# Patient Record
Sex: Female | Born: 1995
Health system: Southern US, Community
[De-identification: ages and names within clinical notes are randomized; demographics above are authoritative.]

## PROBLEM LIST (undated history)

## (undated) ENCOUNTER — Inpatient Hospital Stay (HOSPITAL_COMMUNITY): Payer: Self-pay

## (undated) ENCOUNTER — Emergency Department (HOSPITAL_COMMUNITY)

## (undated) DIAGNOSIS — O1490 Unspecified pre-eclampsia, unspecified trimester: Secondary | ICD-10-CM

## (undated) DIAGNOSIS — F32A Depression, unspecified: Secondary | ICD-10-CM

## (undated) DIAGNOSIS — R748 Abnormal levels of other serum enzymes: Secondary | ICD-10-CM

## (undated) DIAGNOSIS — F329 Major depressive disorder, single episode, unspecified: Secondary | ICD-10-CM

## (undated) DIAGNOSIS — E282 Polycystic ovarian syndrome: Secondary | ICD-10-CM

## (undated) DIAGNOSIS — O24419 Gestational diabetes mellitus in pregnancy, unspecified control: Secondary | ICD-10-CM

## (undated) DIAGNOSIS — T7840XA Allergy, unspecified, initial encounter: Secondary | ICD-10-CM

## (undated) DIAGNOSIS — R51 Headache: Secondary | ICD-10-CM

## (undated) DIAGNOSIS — Z8759 Personal history of other complications of pregnancy, childbirth and the puerperium: Secondary | ICD-10-CM

## (undated) DIAGNOSIS — R519 Headache, unspecified: Secondary | ICD-10-CM

## (undated) DIAGNOSIS — F419 Anxiety disorder, unspecified: Secondary | ICD-10-CM

## (undated) DIAGNOSIS — D649 Anemia, unspecified: Secondary | ICD-10-CM

## (undated) DIAGNOSIS — B999 Unspecified infectious disease: Secondary | ICD-10-CM

## (undated) DIAGNOSIS — D271 Benign neoplasm of left ovary: Secondary | ICD-10-CM

## (undated) DIAGNOSIS — I10 Essential (primary) hypertension: Secondary | ICD-10-CM

## (undated) HISTORY — DX: Allergy, unspecified, initial encounter: T78.40XA

## (undated) HISTORY — DX: Abnormal levels of other serum enzymes: R74.8

## (undated) HISTORY — DX: Headache: R51

## (undated) HISTORY — PX: TOOTH EXTRACTION: SUR596

## (undated) HISTORY — DX: Anxiety disorder, unspecified: F41.9

## (undated) HISTORY — DX: Depression, unspecified: F32.A

## (undated) HISTORY — DX: Headache, unspecified: R51.9

## (undated) HISTORY — PX: NO PAST SURGERIES: SHX2092

## (undated) HISTORY — DX: Unspecified pre-eclampsia, unspecified trimester: O14.90

## (undated) HISTORY — DX: Major depressive disorder, single episode, unspecified: F32.9

---
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
## 2015-09-24 LAB — HM PAP SMEAR: HM PAP: NORMAL

## 2016-09-13 ENCOUNTER — Ambulatory Visit: Payer: Self-pay | Admitting: Internal Medicine

## 2016-09-18 ENCOUNTER — Encounter: Payer: Self-pay | Admitting: Internal Medicine

## 2016-09-18 ENCOUNTER — Ambulatory Visit (INDEPENDENT_AMBULATORY_CARE_PROVIDER_SITE_OTHER): Payer: BLUE CROSS/BLUE SHIELD | Admitting: Internal Medicine

## 2016-09-18 VITALS — BP 122/84 | HR 84 | Temp 97.9°F | Ht 67.5 in | Wt 314.8 lb

## 2016-09-18 DIAGNOSIS — D72829 Elevated white blood cell count, unspecified: Secondary | ICD-10-CM

## 2016-09-18 DIAGNOSIS — R51 Headache: Secondary | ICD-10-CM | POA: Diagnosis not present

## 2016-09-18 DIAGNOSIS — J302 Other seasonal allergic rhinitis: Secondary | ICD-10-CM | POA: Insufficient documentation

## 2016-09-18 DIAGNOSIS — R519 Headache, unspecified: Secondary | ICD-10-CM | POA: Insufficient documentation

## 2016-09-18 DIAGNOSIS — J301 Allergic rhinitis due to pollen: Secondary | ICD-10-CM

## 2016-09-18 DIAGNOSIS — F32A Depression, unspecified: Secondary | ICD-10-CM | POA: Insufficient documentation

## 2016-09-18 DIAGNOSIS — F329 Major depressive disorder, single episode, unspecified: Secondary | ICD-10-CM | POA: Insufficient documentation

## 2016-09-18 DIAGNOSIS — F419 Anxiety disorder, unspecified: Secondary | ICD-10-CM

## 2016-09-18 DIAGNOSIS — F418 Other specified anxiety disorders: Secondary | ICD-10-CM

## 2016-09-18 HISTORY — DX: Anxiety disorder, unspecified: F41.9

## 2016-09-18 HISTORY — DX: Depression, unspecified: F32.A

## 2016-09-18 LAB — CBC WITH DIFFERENTIAL/PLATELET
Basophils Absolute: 0 10*3/uL (ref 0.0–0.1)
Basophils Relative: 0.5 % (ref 0.0–3.0)
Eosinophils Absolute: 0.2 10*3/uL (ref 0.0–0.7)
Eosinophils Relative: 1.8 % (ref 0.0–5.0)
HEMATOCRIT: 38.7 % (ref 36.0–46.0)
Hemoglobin: 12.5 g/dL (ref 12.0–15.0)
LYMPHS PCT: 31.1 % (ref 12.0–46.0)
Lymphs Abs: 2.8 10*3/uL (ref 0.7–4.0)
MCHC: 32.3 g/dL (ref 30.0–36.0)
MCV: 80.4 fl (ref 78.0–100.0)
MONO ABS: 0.5 10*3/uL (ref 0.1–1.0)
Monocytes Relative: 5.6 % (ref 3.0–12.0)
Neutro Abs: 5.5 10*3/uL (ref 1.4–7.7)
Neutrophils Relative %: 61 % (ref 43.0–77.0)
Platelets: 252 10*3/uL (ref 150.0–400.0)
RBC: 4.82 Mil/uL (ref 3.87–5.11)
RDW: 15.1 % (ref 11.5–15.5)
WBC: 9.1 10*3/uL (ref 4.0–10.5)

## 2016-09-18 MED ORDER — ESCITALOPRAM OXALATE 20 MG PO TABS: 20.0000 mg | ORAL_TABLET | Freq: Every day | ORAL | 11 refills | Status: DC

## 2016-09-18 NOTE — Assessment & Plan Note (Signed)
Advised her to try Zyrtec OTC if symptoms are really bad

## 2016-09-18 NOTE — Assessment & Plan Note (Signed)
Chronic but stable on Lexapro Refilled today

## 2016-09-18 NOTE — Progress Notes (Signed)
HPI  Pt presents to the clinic today to establish care and for management of the conditions listed below. She is transferring care from [facility].  Seasonal Allergies: Worse in the spring and summer. She does not take anything OTC for this.  Anxiety and Depression: She is more anxious than depressed. She is not sure what triggers this. She is taking Lexapro with good relief. She is requesting a refill of Lexapro today.  Frequent Headaches: She feels like this is related to the Lexapro. She gets them 2 x week. She takes Tylenol with good relief.  She does c/o a tick bite. This occurred 3 weeks ago. She was seen at [facility] and was tested for Lyme Disease and RMSF. Her WBC was 17.4%. She was treated with 7 days of Doxycycline.   Flu: never Tetanus: unsure Pap Smear: 09/2015, [facility] Dentist: annually  Past Medical History:  Diagnosis Date  . Allergy   . Depression   . Frequent headaches     Current Outpatient Prescriptions  Medication Sig Dispense Refill  . escitalopram (LEXAPRO) 20 MG tablet Take by mouth.     No current facility-administered medications for this visit.     Allergies  Allergen Reactions  . Etodolac Nausea Only  . Meloxicam Other (See Comments)    Abdominal pain    Family History  Problem Relation Age of Onset  . Alcohol abuse Mother   . Drug abuse Mother   . Depression Mother   . Anxiety disorder Mother   . Alcohol abuse Father   . Bipolar disorder Father   . Depression Father   . COPD Maternal Grandmother   . COPD Maternal Grandfather   . Arthritis Paternal Grandmother   . Bipolar disorder Paternal Grandfather     Social History   Social History  . Marital status: Married    Spouse name: N/A  . Number of children: N/A  . Years of education: N/A   Occupational History  . Not on file.   Social History Main Topics  . Smoking status: Never Smoker  . Smokeless tobacco: Never Used  . Alcohol use Yes     Comment:  occasional  . Drug use: No  . Sexual activity: Not on file   Other Topics Concern  . Not on file   Social History Narrative  . No narrative on file    ROS:  Constitutional: Denies fever, malaise, fatigue, headache or abrupt weight changes.  HEENT: Denies eye pain, eye redness, ear pain, ringing in the ears, wax buildup, runny nose, nasal congestion, bloody nose, or sore throat. Respiratory: Denies difficulty breathing, shortness of breath, cough or sputum production.   Cardiovascular: Denies chest pain, chest tightness, palpitations or swelling in the hands or feet.  Gastrointestinal: Denies abdominal pain, bloating, constipation, diarrhea or blood in the stool.  GU: Denies frequency, urgency, pain with urination, blood in urine, odor or discharge. Musculoskeletal: Denies decrease in range of motion, difficulty with gait, muscle pain or joint pain and swelling.  Skin: Denies redness, rashes, lesions or ulcercations.  Neurological: Denies dizziness, difficulty with memory, difficulty with speech or problems with balance and coordination.  Psych: Denies anxiety, depression, SI/HI.  No other specific complaints in a complete review of systems (except as listed in HPI above).  PE:  BP 122/84   Pulse 84   Temp 97.9 F (36.6 C) (Oral)   Ht 5' 7.5" (1.715 m)   Wt (!) 314 lb 12 oz (142.8 kg)   LMP  09/01/2016   SpO2 98%   BMI 48.57 kg/m  Wt Readings from Last 3 Encounters:  09/18/16 (!) 314 lb 12 oz (142.8 kg)    General: Appears her stated age, obese in NAD. HEENT: Head: normal shape and size; Eyes: sclera white, no icterus, conjunctiva pink, PERRLA and EOMs intact; Ears: Tm's gray and intact, normal light reflex;Throat/Mouth: Teeth present, mucosa pink and moist, no lesions or ulcerations noted.  Cardiovascular: Normal rate and rhythm. S1,S2 noted.  No murmur, rubs or gallops noted.  Pulmonary/Chest: Normal effort and positive vesicular breath sounds. No respiratory distress. No  wheezes, rales or ronchi noted.  Neurological: Alert and oriented.  Psychiatric: Mood and affect normal. Behavior is normal. Judgment and thought content normal.    Assessment and Plan:  Elevated WBC:  CBC repeated today  RTC in 1 year for your annual exam Webb Silversmith, NP

## 2016-09-18 NOTE — Patient Instructions (Signed)
Leukocytosis Leukocytosis means that a person has more white blood cells than normal. White blood cells are made in the bone marrow. Bone marrow is the spongy tissue inside of bones. The main job of white blood cells is to fight infection. Having too many white blood cells is a common condition. It can develop as a result of many types of medical problems. What are the causes? This condition may be caused by various problems. In some cases, the bone marrow is normal but it is still making too many white blood cells. This could be the result of:  Infection.  Injury.  Physical stress.  Emotional stress.  Surgery.  Allergic reactions.  Tumors that do not start in the blood or bone marrow.  An inherited disease.  Certain medicines.  Pregnancy and labor. In other cases, a person may have a bone marrow disorder that is causing the body to make too many white blood cells. Bone marrow disorders include:  Leukemia. This is a type of blood cancer.  Myeloproliferative disorders. These disorders cause blood cells to grow abnormally. What are the signs or symptoms? Often, this condition causes no symptoms. Some people may have symptoms due to the medical problem that is causing their leukocytosis. These symptoms may include:  Bleeding.  Bruising.  Fever.  Night sweats.  Repeated infections.  Weakness.  Weight loss. How is this diagnosed? This condition is diagnosed with blood tests. It is often found when blood is tested as part of a routine physical exam. You may have other tests to help determine why you have too many white blood cells. These tests may include:  A complete blood count (CBC). This test measures all the types of blood cells in your body.  Chest X-rays, urine tests, or other tests to look for signs of infection.  Bone marrow aspiration. For this test, a needle is put into your bone. Cells from the bone marrow are removed through the needle, then they are  examined under a microscope.  Other tests on the blood or bone marrow sample. How is this treated? Usually, treatment is not needed for leukocytosis. However, if a disorder is causing your leukocytosis, it will need to be treated. Treatment may include:  Antibiotic medicine if you have a bacterial infection.  Bone marrow transplant. This treatment replaces your diseased bone marrow with healthy cells that will grow new bone marrow.  Chemotherapy or biological therapies such as the use of antibodies. These treatments may be used to kill cancer cells or to decrease the number of white blood cells. Follow these instructions at home: Medicines   Take over-the-counter and prescription medicines only as told by your health care provider.  If you were prescribed an antibiotic medicine, take it as told by your health care provider. Do not stop taking the antibiotic even if you start to feel better. Eating and drinking   Eat foods that are low in saturated fats and high in fiber. Eat plenty of fruits and vegetables.  Drink enough fluid to keep your urine clear or pale yellow.  Limit your intake of caffeine and alcohol. General instructions   Maintain a healthy weight. Ask your health care provider what weight is best for you.  Do 30 minutes of exercise at least 5 times each week. Check with your health care provider before you start a new exercise routine.  Do not use tobacco products, including cigarettes, chewing tobacco, or e-cigarettes. If you need help quitting, ask your health care provider.  Keep all follow-up visits as told by your health care provider. This is important. Contact a health care provider if:  You feel weak or more tired than usual.  You develop chills, a cough, or nasal congestion.  You have a fever.  You lose weight without trying.  You have night sweats.  You bruise easily. Get help right away if:  You bleed more than normal.  You have chest  pain.  You have trouble breathing.  You have uncontrolled nausea or vomiting.  You feel dizzy or light-headed. This information is not intended to replace advice given to you by your health care provider. Make sure you discuss any questions you have with your health care provider. Document Released: 05/31/2011 Document Revised: 11/17/2015 Document Reviewed: 12/13/2014 Elsevier Interactive Patient Education  2017 [employer].

## 2016-09-18 NOTE — Assessment & Plan Note (Signed)
She feels like this is medication related but the benefits of the Lexapro outweigh the side effects Continue Tylenol prn

## 2016-10-30 ENCOUNTER — Ambulatory Visit (INDEPENDENT_AMBULATORY_CARE_PROVIDER_SITE_OTHER): Payer: BLUE CROSS/BLUE SHIELD | Admitting: Internal Medicine

## 2016-10-30 ENCOUNTER — Encounter: Payer: Self-pay | Admitting: Internal Medicine

## 2016-10-30 DIAGNOSIS — F329 Major depressive disorder, single episode, unspecified: Secondary | ICD-10-CM

## 2016-10-30 DIAGNOSIS — F419 Anxiety disorder, unspecified: Secondary | ICD-10-CM | POA: Diagnosis not present

## 2016-10-30 DIAGNOSIS — F32A Depression, unspecified: Secondary | ICD-10-CM

## 2016-10-30 MED ORDER — FLUOXETINE HCL 20 MG PO TABS: 20.0000 mg | ORAL_TABLET | Freq: Every day | ORAL | 2 refills | Status: DC

## 2016-10-30 MED ORDER — HYDROXYZINE HCL 10 MG PO TABS: 10.0000 mg | ORAL_TABLET | Freq: Every day | ORAL | 0 refills | Status: DC | PRN

## 2016-10-30 NOTE — Patient Instructions (Signed)
Generalized Anxiety Disorder, Adult Generalized anxiety disorder (GAD) is a mental health disorder. People with this condition constantly worry about everyday events. Unlike normal anxiety, worry related to GAD is not triggered by a specific event. These worries also do not fade or get better with time. GAD interferes with life functions, including relationships, work, and school. GAD can vary from mild to severe. People with severe GAD can have intense waves of anxiety with physical symptoms (panic attacks). What are the causes? The exact cause of GAD is not known. What increases the risk? This condition is more likely to develop in:  Women.  People who have a family history of anxiety disorders.  People who are very shy.  People who experience very stressful life events, such as the death of a loved one.  People who have a very stressful family environment. What are the signs or symptoms? People with GAD often worry excessively about many things in their lives, such as their health and family. They may also be overly concerned about:  Doing well at work.  Being on time.  Natural disasters.  Friendships. Physical symptoms of GAD include:  Fatigue.  Muscle tension or having muscle twitches.  Trembling or feeling shaky.  Being easily startled.  Feeling like your heart is pounding or racing.  Feeling out of breath or like you cannot take a deep breath.  Having trouble falling asleep or staying asleep.  Sweating.  Nausea, diarrhea, or irritable bowel syndrome (IBS).  Headaches.  Trouble concentrating or remembering facts.  Restlessness.  Irritability. How is this diagnosed? Your health care provider can diagnose GAD based on your symptoms and medical history. You will also have a physical exam. The health care provider will ask specific questions about your symptoms, including how severe they are, when they started, and if they come and go. Your health care  provider may ask you about your use of alcohol or drugs, including prescription medicines. Your health care provider may refer you to a mental health specialist for further evaluation. Your health care provider will do a thorough examination and may perform additional tests to rule out other possible causes of your symptoms. To be diagnosed with GAD, a person must have anxiety that:  Is out of his or her control.  Affects several different aspects of his or her life, such as work and relationships.  Causes distress that makes him or her unable to take part in normal activities.  Includes at least three physical symptoms of GAD, such as restlessness, fatigue, trouble concentrating, irritability, muscle tension, or sleep problems. Before your health care provider can confirm a diagnosis of GAD, these symptoms must be present more days than they are not, and they must last for six months or longer. How is this treated? The following therapies are usually used to treat GAD:  Medicine. Antidepressant medicine is usually prescribed for long-term daily control. Antianxiety medicines may be added in severe cases, especially when panic attacks occur.  Talk therapy (psychotherapy). Certain types of talk therapy can be helpful in treating GAD by providing support, education, and guidance. Options include:  Cognitive behavioral therapy (CBT). People learn coping skills and techniques to ease their anxiety. They learn to identify unrealistic or negative thoughts and behaviors and to replace them with positive ones.  Acceptance and commitment therapy (ACT). This treatment teaches people how to be mindful as a way to cope with unwanted thoughts and feelings.  Biofeedback. This process trains you to manage your body's response (  physiological response) through breathing techniques and relaxation methods. You will work with a therapist while machines are used to monitor your physical symptoms.  Stress  management techniques. These include yoga, meditation, and exercise. A mental health specialist can help determine which treatment is best for you. Some people see improvement with one type of therapy. However, other people require a combination of therapies. Follow these instructions at home:  Take over-the-counter and prescription medicines only as told by your health care provider.  Try to maintain a normal routine.  Try to anticipate stressful situations and allow extra time to manage them.  Practice any stress management or self-calming techniques as taught by your health care provider.  Do not punish yourself for setbacks or for not making progress.  Try to recognize your accomplishments, even if they are small.  Keep all follow-up visits as told by your health care provider. This is important. Contact a health care provider if:  Your symptoms do not get better.  Your symptoms get worse.  You have signs of depression, such as:  A persistently sad, cranky, or irritable mood.  Loss of enjoyment in activities that used to bring you joy.  Change in weight or eating.  Changes in sleeping habits.  Avoiding friends or family members.  Loss of energy for normal tasks.  Feelings of guilt or worthlessness. Get help right away if:  You have serious thoughts about hurting yourself or others. If you ever feel like you may hurt yourself or others, or have thoughts about taking your own life, get help right away. You can go to your nearest emergency department or call:  Your local emergency services (911 in the [place]  A suicide crisis helpline, such as the National Suicide Prevention Lifeline at 1-800-273-8255. This is open 24 hours a day. Summary  Generalized anxiety disorder (GAD) is a mental health disorder that involves worry that is not triggered by a specific event.  People with GAD often worry excessively about many things in their lives, such as their health and  family.  GAD may cause physical symptoms such as restlessness, trouble concentrating, sleep problems, frequent sweating, nausea, diarrhea, headaches, and trembling or muscle twitching.  A mental health specialist can help determine which treatment is best for you. Some people see improvement with one type of therapy. However, other people require a combination of therapies. This information is not intended to replace advice given to you by your health care provider. Make sure you discuss any questions you have with your health care provider. Document Released: 10/06/2012 Document Revised: 05/01/2016 Document Reviewed: 05/01/2016 Elsevier Interactive Patient Education  2017 [employer] Inc.  

## 2016-10-30 NOTE — Assessment & Plan Note (Signed)
Deteriorated D/c Lexapro eRx for Prozac 20 mg daily eRx for Hydroxyzine to use daily prn  Follow up with me in 4 weeks to let me know how you are doing.

## 2016-10-30 NOTE — Progress Notes (Signed)
Subjective:    Patient ID: Latoya Mora, female    DOB: 20-Sep-1995, 21 y.o.   MRN: [id number]  HPI  Pt presents to the clinic today with c/o worsening anxiety and panic attacks. She reports this started getting worse over the last month. She thought her anxiety was well controlled on Lexapro but has felt like it is more uncontrolled. She is not sure what is trigger her worsening anxiety. She denies any active depression. She starts having episodes of chest tightness, shortness of breath, feeling like her heart is beating fast. These come out of no where. She reports she used to take Xanax in the past for panic attacks but does not want to take that. She has also failed Celexa and Effexor in the past.  Review of Systems      Past Medical History:  Diagnosis Date  . Allergy   . Depression   . Frequent headaches     Current Outpatient Prescriptions  Medication Sig Dispense Refill  . Doxylamine Succinate, Sleep, (SLEEP AID PO) Take by mouth as needed.    Marland Kitchen escitalopram (LEXAPRO) 20 MG tablet Take 1 tablet (20 mg total) by mouth at bedtime. 30 tablet 11  . FLUoxetine (PROZAC) 20 MG tablet Take 1 tablet (20 mg total) by mouth daily. 30 tablet 2  . hydrOXYzine (ATARAX/VISTARIL) 10 MG tablet Take 1 tablet (10 mg total) by mouth daily as needed. 20 tablet 0   No current facility-administered medications for this visit.     Allergies  Allergen Reactions  . Etodolac Nausea Only  . Meloxicam Other (See Comments)    Abdominal pain    Family History  Problem Relation Age of Onset  . Alcohol abuse Mother   . Drug abuse Mother   . Depression Mother   . Anxiety disorder Mother   . Alcohol abuse Father   . Bipolar disorder Father   . Depression Father   . COPD Maternal Grandmother   . COPD Maternal Grandfather   . Arthritis Paternal Grandmother   . Bipolar disorder Paternal Grandfather   . Cancer Neg Hx     Social History   Social History  . Marital status: Married    Spouse  name: N/A  . Number of children: N/A  . Years of education: N/A   Occupational History  . Not on file.   Social History Main Topics  . Smoking status: Former Research scientist (life sciences)  . Smokeless tobacco: Never Used     Comment: quit 11/12/2010   . Alcohol use Yes     Comment: occasional  . Drug use: No  . Sexual activity: Yes   Other Topics Concern  . Not on file   Social History Narrative  . No narrative on file     Constitutional: Denies fever, malaise, fatigue, headache or abrupt weight changes.  Respiratory: Pt reports shortness of breath. Denies difficulty breathing, cough or sputum production.   Cardiovascular: Pt reports chest tightness. Denies chest pain, palpitations or swelling in the hands or feet.  Psych: Pt reports anxiety. Denies depression, SI/HI.  No other specific complaints in a complete review of systems (except as listed in HPI above).  Objective:   Physical Exam   BP 128/84   Pulse 94   Temp 98 F (36.7 C) (Oral)   Wt (!) 321 lb (145.6 kg)   SpO2 98%   BMI 49.53 kg/m  Wt Readings from Last 3 Encounters:  10/30/16 (!) 321 lb (145.6 kg)  09/18/16 (!) 314 lb  12 oz (142.8 kg)    General: Appears her stated age, obese in NAD. Neurological: Alert and oriented.  Psychiatric: She is fidgety and anxious appearing today.  CBC    Component Value Date/Time   WBC 9.1 09/18/2016 1113   RBC 4.82 09/18/2016 1113   HGB 12.5 09/18/2016 1113   HCT 38.7 09/18/2016 1113   PLT 252.0 09/18/2016 1113   MCV 80.4 09/18/2016 1113   MCHC 32.3 09/18/2016 1113   RDW 15.1 09/18/2016 1113   LYMPHSABS 2.8 09/18/2016 1113   MONOABS 0.5 09/18/2016 1113   EOSABS 0.2 09/18/2016 1113   BASOSABS 0.0 09/18/2016 1113    Hgb A1C No results found for: HGBA1C         Assessment & Plan:

## 2016-11-02 ENCOUNTER — Telehealth: Payer: Self-pay

## 2016-11-02 DIAGNOSIS — F329 Major depressive disorder, single episode, unspecified: Secondary | ICD-10-CM

## 2016-11-02 DIAGNOSIS — F32A Depression, unspecified: Secondary | ICD-10-CM

## 2016-11-02 DIAGNOSIS — F419 Anxiety disorder, unspecified: Principal | ICD-10-CM

## 2016-11-02 NOTE — Telephone Encounter (Signed)
Pt left v/m; pt seen 10/30/16; pt had anxiety attack since seen and vistaril did not help at all. Pt request cb with what to do now? [facility].

## 2016-11-02 NOTE — Telephone Encounter (Signed)
Left detailed msg on VM per HIPAA  

## 2016-11-02 NOTE — Telephone Encounter (Signed)
She needs to continue the Visatril and give the Prozac more time to work

## 2016-11-20 NOTE — Telephone Encounter (Signed)
Pt left v/m; pt not doing well on vistaril and prozac;pt seen 10/30/16; pt having h/as, jittery, and anxious since taking meds. Pt request cb with what to try next.[facility].

## 2016-11-20 NOTE — Telephone Encounter (Signed)
Referral placed.

## 2016-11-20 NOTE — Addendum Note (Signed)
Addended by: Jearld Fenton on: 11/20/2016 01:57 PM   Modules accepted: Orders

## 2016-11-20 NOTE — Telephone Encounter (Signed)
She has failed multiple drugs. We can try Paxil or we can go ahead and refer her to psychology for Cognitive Behavioral Therapy

## 2016-11-20 NOTE — Telephone Encounter (Signed)
Pt would like to go ahead with the psych referral--- pt is aware they will call her to set up appt

## 2017-04-03 ENCOUNTER — Telehealth: Payer: Self-pay

## 2017-04-03 ENCOUNTER — Ambulatory Visit (INDEPENDENT_AMBULATORY_CARE_PROVIDER_SITE_OTHER): Payer: BLUE CROSS/BLUE SHIELD | Admitting: Internal Medicine

## 2017-04-03 ENCOUNTER — Telehealth: Payer: Self-pay | Admitting: Internal Medicine

## 2017-04-03 ENCOUNTER — Encounter: Payer: Self-pay | Admitting: Internal Medicine

## 2017-04-03 VITALS — BP 136/88 | HR 120 | Temp 98.1°F | Ht 67.5 in | Wt 334.0 lb

## 2017-04-03 DIAGNOSIS — F329 Major depressive disorder, single episode, unspecified: Secondary | ICD-10-CM

## 2017-04-03 DIAGNOSIS — F419 Anxiety disorder, unspecified: Secondary | ICD-10-CM

## 2017-04-03 DIAGNOSIS — F32A Depression, unspecified: Secondary | ICD-10-CM

## 2017-04-03 HISTORY — DX: Morbid (severe) obesity due to excess calories: E66.01

## 2017-04-03 MED ORDER — CLONAZEPAM 0.5 MG PO TABS: 0.5000 mg | ORAL_TABLET | Freq: Every day | ORAL | 0 refills | Status: DC | PRN

## 2017-04-03 MED ORDER — VENLAFAXINE HCL ER 75 MG PO CP24: 150.0000 mg | ORAL_CAPSULE | Freq: Every day | ORAL | 1 refills | Status: DC

## 2017-04-03 NOTE — Progress Notes (Signed)
   Subjective:    Patient ID: Latoya Mora, female    DOB: [birth year], 21 y.o.   MRN: [id number]  HPI  Here to f/u, has uncontrolled anxiety since 21yo, has been on multiple meds and not taking anything now, had some side effect or didn't work well.  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  Can feel like an attack at times with palpitation, dizziness where she has to sit down.  Pt denies chest pain, increased sob or doe, wheezing, orthopnea, PND, increased LE swelling, or syncope.  Was on BCp's up until about 10 mo ago, seems to have some menstrual cycle exacerbation as well.  Can tell when she has a anxiety attack when she notices blurry vision.  Effexor worked the best of her meds in the past, thinks she got to about 75 mg in the past.  Is planning to possibly try to have a pregnancy in about 6 mo.  Vistaril did not help.  Prozac was terrible feeling, lexapro eventually less effectvie at 20 mg.   Past Medical History:  Diagnosis Date  . Allergy   . Depression   . Frequent headaches    No past surgical history on file.  reports that she has quit smoking. She has never used smokeless tobacco. She reports that she drinks alcohol. She reports that she does not use drugs. family history includes Alcohol abuse in her father and mother; Anxiety disorder in her mother; Arthritis in her paternal grandmother; Bipolar disorder in her father and paternal grandfather; COPD in her maternal grandfather and maternal grandmother; Depression in her father and mother; Drug abuse in her mother. Allergies  Allergen Reactions  . Etodolac Nausea Only  . Meloxicam Other (See Comments)    Abdominal pain  . Prozac [Fluoxetine Hcl]     Felt bad/awful   No current outpatient prescriptions on file prior to visit.   No current facility-administered medications on file prior to visit.    Review of Systems All otherwise neg per pt       Objective:   Physical Exam BP 136/88   Pulse (!) 120   Temp 98.1 F (36.7 C) (Oral)   Ht 5' 7.5" (1.715 m)   Wt (!) 334 lb (151.5 kg)   SpO2 98%   BMI 51.54 kg/m  VS noted, supermorbid obese Constitutional: Pt appears in NAD HENT: Head: NCAT.  Right Ear: External ear normal.  Left Ear: External ear normal.  Eyes: . Pupils are equal, round, and reactive to light. Conjunctivae and EOM are normal Nose: without d/c or deformity Neck: Neck supple. Gross normal ROM Cardiovascular: Normal rate and regular rhythm.   Pulmonary/Chest: Effort normal and breath sounds without rales or wheezing.  Neurological: Pt is alert. At baseline orientation, motor grossly intact Skin: Skin is warm. No rashes, other new lesions, no LE edema Psychiatric: Pt behavior is normal without agitation , 1-2+ nervous      Assessment & Plan:

## 2017-04-03 NOTE — Telephone Encounter (Signed)
See other TH note 04/03/17.

## 2017-04-03 NOTE — Telephone Encounter (Signed)
Patient Name: Latoya Mora  DOB: Apr 08, 1996    Initial Comment Caller states she is having severe anxiety and wants to be seen by the doctor asap    Nurse Assessment  Nurse: Zorita Pang, RN, Neoma Laming Date/Time (Eastern Time): 04/03/2017 12:24:39 PM  Confirm and document reason for call. If symptomatic, describe symptoms. ---The caller states that she has been having anxiety since age 21. She states that she is not currently on any medication. She states that she has tried all different medications but the side effects were too bad. She is not able to block out her symptoms. She states that this is interfering with work.  Does the patient have any new or worsening symptoms? ---Yes  Will a triage be completed? ---Yes  Related visit to physician within the last 2 weeks? ---No  Does the PT have any chronic conditions? (i.e. diabetes, asthma, etc.) ---Yes  List chronic conditions. ---anxiety and panic attacks  Is the patient pregnant or possibly pregnant? (Ask all females between the ages of 37-55) ---No  Is this a behavioral health or substance abuse call? ---No     Guidelines    Guideline Title Affirmed Question Affirmed Notes  Anxiety and Panic Attack Symptoms interfere with work or school    Final Disposition User   See Physician within 24 Hours Womble, RN, [employer]    Referrals  REFERRED TO PCP OFFICE

## 2017-04-03 NOTE — Patient Instructions (Addendum)
Please take all new medication as prescribed   - the effexor at 75 mg per day for the first week, then 150 mg per day after that, and the clonazepam as needed for panic symptoms  You will be contacted regarding the referral for: Psychology (counseling)  Please continue all other medications as before, and refills have been done if requested.  Please have the pharmacy call with any other refills you may need.  Please keep your appointments with your specialists as you may have planned

## 2017-04-03 NOTE — Telephone Encounter (Signed)
PLEASE NOTE: All timestamps contained within this report are represented as [place] Standard Time. CONFIDENTIALTY NOTICE: This fax transmission is intended only for the addressee. It contains information that is legally privileged, confidential or otherwise protected from use or disclosure. If you are not the intended recipient, you are strictly prohibited from reviewing, disclosing, copying using or disseminating any of this information or taking any action in reliance on or regarding this information. If you have received this fax in error, please notify us immediately by telephone so that we can arrange for its return to [place]. Phone: [phone], Toll-Free: [phone], Fax: [phone] Page: 1 of 1 Call Id: [id number] [place] Patient Name: Latoya Mora Gender: Female DOB: [birth year] Age: 21 Y 9 M 4 D Return Phone Number: [phone] (Primary) Address: [place] [address] Client [facility] - Client Client Site [place] - Day Physician Webb Silversmith - NP Contact Type Call Who Is Calling Patient / Member / Family / Caregiver Call Type Triage / Clinical Relationship To Patient Self Return Phone Number [phone] (Primary) Chief Complaint Anxiety and Panic Attack Reason for Call Symptomatic / Request for Health Information Initial Comment caller is having severe anxiety that's causing her to not be able to focus on anything .States she has been having anxiety for months . Would like an appointment Appointment Disposition EMR Caller Not Reached Info pasted into Epic No Translation No Nurse Assessment Guidelines Guideline Title Affirmed Question Affirmed Notes Nurse Date/Time (Eastern Time) Disp. Time Eilene Ghazi Time) Disposition Final User 04/03/2017 10:17:10 AM Attempt made - message left Sherrell Puller, RN, Amy 04/03/2017  10:21:15 AM Send To RN Personal Sherrell Puller, RN, Amy 04/03/2017 10:27:01 AM Attempt made - message left [place], RN, Sharyn Lull 04/03/2017 11:05:11 AM Send To RN Personal Neena Rhymes, RN, Sharyn Lull 04/03/2017 11:08:22 AM Send To RN Personal Neena Rhymes, RN, Sharyn Lull 04/03/2017 11:17:38 AM FINAL ATTEMPT MADE - no message left Yes Sherrell Puller, RN, Amy

## 2017-04-03 NOTE — Telephone Encounter (Signed)
Left v/m requesting pt to cb to schedule appt.

## 2017-04-03 NOTE — Telephone Encounter (Signed)
Pt has appt 04/03/17 at 1:45 wotj Dr Cathlean Cower and another note already sent to Dr Jenny Reichmann.

## 2017-04-03 NOTE — Assessment & Plan Note (Addendum)
Likely moderate, for restart effexor at 75 mg for week 1, then 150 mg thereafter, for limited klonopin .5 qd prn, to f/u any worsening symptoms or concerns, also will accept counseling referral

## 2017-04-03 NOTE — Telephone Encounter (Signed)
Pt has appt with Dr Cathlean Cower 04/03/17 at 1:45.

## 2017-04-03 NOTE — Telephone Encounter (Signed)
°  Patient Name: Latoya Mora  DOB: 10/10/1995    Initial Comment Caller states she is having severe anxiety and wants to be seen by the doctor asap    Nurse Assessment  Nurse: Zorita Pang, RN, Neoma Laming Date/Time (Eastern Time): 04/03/2017 12:24:39 PM  Confirm and document reason for call. If symptomatic, describe symptoms. ---The caller states that she has been having anxiety since age 21. She states that she is not currently on any medication. She states that she has tried all different medications but the side effects were too bad. She is not able to block out her symptoms. She states that this is interfering with work.  Does the patient have any new or worsening symptoms? ---Yes  Will a triage be completed? ---Yes  Related visit to physician within the last 2 weeks? ---No  Does the PT have any chronic conditions? (i.e. diabetes, asthma, etc.) ---Yes  List chronic conditions. ---anxiety and panic attacks  Is the patient pregnant or possibly pregnant? (Ask all females between the ages of 21-55) ---No  Is this a behavioral health or substance abuse call? ---No     Guidelines    Guideline Title Affirmed Question Affirmed Notes  Anxiety and Panic Attack Symptoms interfere with work or school    Final Disposition User   See Physician within 24 Hours Womble, RN, [employer] officer    Referrals  REFERRED TO PCP OFFICE

## 2017-06-25 NOTE — L&D Delivery Note (Signed)
Delivery Note Latoya Mora is a 22 y.o. G1P0000 at [redacted]w[redacted]d admitted for IOL d/t CHTN and elevated LFTs since Oct.  Labor course: cytotec, foley bulb, pit, arom Pushed  X 61mins and at 0133 a viable female was delivered via spontaneous vaginal delivery (Presentation: LOA ).  Infant placed directly on mom's abdomen for bonding/skin-to-skin. Delayed cord clamping x 70min, then cord clamped x 2, and cut by FOB.  APGAR: , ; weight: pending at time of note.  40 units of pitocin diluted in 1000cc LR was infused rapidly IV per protocol. The placenta separated spontaneously and delivered via CCT and maternal pushing effort.  It was inspected and appears to be intact with a 3 VC.  Placenta/Cord with the following complications: none .  Cord pH: not done  Intrapartum complications:  None Anesthesia:  epidural Episiotomy: none Lacerations:  none Suture Repair: n/a Est. Blood Loss (mL): 50 Sponge and instrument count were correct x2.  Mom to postpartum.  Baby to Couplet care / Skin to Skin. Placenta to L&D. Plans to breastfeed Contraception: undecided Circ: outpatient  [name] CNM, [facility] 05/28/2018 1:53 AM   Gertie Exon, Royetta Crochet, CNM  [facility]        Please schedule this patient for BP check: 1 week, then pp visit 4-6wks  High risk pregnancy complicated by: [facility]  Delivery mode: SVD  Anticipated Birth Control: other/unsure  PP Procedures needed: BP check  Schedule Integrated BH visit: no  Provider: Any provider

## 2017-08-21 ENCOUNTER — Encounter: Payer: Self-pay | Admitting: Obstetrics & Gynecology

## 2017-08-21 ENCOUNTER — Ambulatory Visit (INDEPENDENT_AMBULATORY_CARE_PROVIDER_SITE_OTHER): Payer: BLUE CROSS/BLUE SHIELD | Admitting: Obstetrics & Gynecology

## 2017-08-21 ENCOUNTER — Other Ambulatory Visit: Payer: Self-pay

## 2017-08-21 VITALS — BP 136/82 | HR 102 | Resp 16 | Ht 68.0 in | Wt 321.0 lb

## 2017-08-21 DIAGNOSIS — E282 Polycystic ovarian syndrome: Secondary | ICD-10-CM | POA: Diagnosis not present

## 2017-08-21 DIAGNOSIS — Z0184 Encounter for antibody response examination: Secondary | ICD-10-CM | POA: Diagnosis not present

## 2017-08-21 DIAGNOSIS — N921 Excessive and frequent menstruation with irregular cycle: Secondary | ICD-10-CM

## 2017-08-21 DIAGNOSIS — N926 Irregular menstruation, unspecified: Secondary | ICD-10-CM

## 2017-08-21 LAB — HEMOGLOBIN: HEMOGLOBIN: 12

## 2017-08-21 LAB — POCT URINE PREGNANCY: Preg Test, Ur: NEGATIVE

## 2017-08-21 MED ORDER — NORETHINDRONE ACETATE 5 MG PO TABS
ORAL_TABLET | ORAL | 0 refills | Status: DC
Start: 2017-08-21 — End: 2017-09-30

## 2017-08-21 MED ORDER — VENLAFAXINE HCL ER 150 MG PO CP24: 150.0000 mg | ORAL_CAPSULE | Freq: Every day | ORAL | 4 refills | Status: DC

## 2017-08-21 NOTE — Patient Instructions (Signed)
Folic acid 677JPV or just a PNV vitamin.

## 2017-08-21 NOTE — Progress Notes (Signed)
Patient scheduled while in office for PUS on 08/22/17 at 1:30pm, with consult to follow at 2pm with Dr. Sabra Heck. Patient verbalizes understanding and is agreeable. PUS order placed.

## 2017-08-21 NOTE — Progress Notes (Signed)
22 y.o. G0P0000 MarriedCaucasianF here for new patient visit due to irregular and prolonged bleeding.  She does not have an established gynecologist.    Reports she used Nexplanon 2015-2017.  Had it removed in early 2017 and then got married in December.  Did well with the Nexplanon.  Only had it removed due to desires for pregnancy.  She and spouse had considered starting ot try in March.  Since removal, they have used condoms for contraception.  Cycles are typically about every 35 days.  Typically flow lasts for 5-6.  First two days are heavy, changing super tampons every 6 hours.  Does pass some clots.  Reports she does feel like she ovulates as has similar "twinge of pain" every month on one side or the other.  Had "normal" cycle at the beginning of February.  Stopped bleeding for about a week.  Then started again on 08/12/17.  Flow has been heavy at times and then it would taper down and she thought it would stop but then start right back.  Today is day 10 of bleeding.  Has bled through clothes at night.  Denies SOB and palpitations.  Has no lightheadedness.  Does have some fatigue.    Hb normal today.  Patient's last menstrual period was 08/12/2017.          Sexually active: Yes.    The current method of family planning is none.    Exercising: No.   Smoker:  no  Health Maintenance: Pap:  2017 Normal, at [facility]. History of abnormal Pap:  no MMG:  Never TDaP:  Unsure Gardasil: Completed  Screening Labs:  UPT=Neg    reports that she has quit smoking. she has never used smokeless tobacco. She reports that she drinks alcohol. She reports that she does not use drugs.  Past Medical History:  Diagnosis Date  . Allergy   . Anxiety   . Depression   . Frequent headaches     History reviewed. No pertinent surgical history.  Current Outpatient Medications  Medication Sig Dispense Refill  . clonazePAM (KLONOPIN) 0.5 MG tablet Take 1 tablet (0.5 mg total) by mouth daily as needed  for anxiety. 20 tablet 0  . diphenhydrAMINE (BENADRYL) 25 mg capsule Take 25 mg by mouth at bedtime as needed.    . venlafaxine XR (EFFEXOR XR) 75 MG 24 hr capsule Take 2 capsules (150 mg total) by mouth daily with breakfast. 180 capsule 1   No current facility-administered medications for this visit.     Family History  Problem Relation Age of Onset  . Alcohol abuse Mother   . Drug abuse Mother   . Depression Mother   . Anxiety disorder Mother   . Heart disease Mother   . Skin cancer Mother   . Scoliosis Mother   . Alcohol abuse Father   . Bipolar disorder Father   . Depression Father   . Osteochondroma Father   . COPD Maternal Grandmother   . Scoliosis Maternal Grandmother   . COPD Maternal Grandfather   . Skin cancer Maternal Grandfather   . Arthritis Paternal Grandmother   . Bipolar disorder Paternal Grandfather   . Cancer Neg Hx     ROS:  Pertinent items are noted in HPI.  Otherwise, a comprehensive ROS was negative.  Exam:   BP 136/82 (BP Location: Right Arm, Patient Position: Sitting, Cuff Size: Large)   Pulse (!) 102   Resp 16   Ht 5\' 8"  (1.727 m)   Wt Marland Kitchen)  321 lb (145.6 kg)   LMP 08/12/2017   BMI 48.81 kg/m     Height: 5\' 8"  (172.7 cm)  Ht Readings from Last 3 Encounters:  08/21/17 5\' 8"  (1.727 m)  04/03/17 5' 7.5" (1.715 m)  09/18/16 5' 7.5" (1.715 m)    General appearance: alert, cooperative and appears stated age Head: Normocephalic, without obvious abnormality, atraumatic Neck: no adenopathy, supple, symmetrical, trachea midline and thyroid normal to inspection and palpation Lungs: clear to auscultation bilaterally Breasts: normal appearance, no masses or tenderness Heart: regular rate and rhythm Abdomen: soft, non-tender; bowel sounds normal; no masses,  no organomegaly Extremities: extremities normal, atraumatic, no cyanosis or edema Skin: Skin color, texture, turgor normal. No rashes or lesions Lymph nodes: Cervical, supraclavicular, and axillary  nodes normal. No abnormal inguinal nodes palpated Neurologic: Grossly normal   Pelvic: External genitalia:  no lesions              Urethra:  normal appearing urethra with no masses, tenderness or lesions              Bartholins and Skenes: normal                 Vagina: normal appearing vagina with normal color and discharge, no lesions              Cervix: no lesions              Pap taken: No. Bimanual Exam:  Uterus:  normal size, contour, position, consistency, mobility, non-tender              Adnexa: normal adnexa and no mass, fullness, tenderness               Rectovaginal: Confirms               Anus:  normal sphincter tone, no lesions  Chaperone was present for exam.  A:  Menorrhagia Obesity Likely anovulatory cycles  P:   Will start aygestin 10mg  bid until bleeding stops and then daily for 21 days.  Will stop after that time and have a cycle pap smear due next year.  Release signed to see if can get pap records Will return for PUS to assess endometrium and need possible D&C Rx for Venlafaxine sent to pharmacy on file.   ~30 minutes spent with patient >50% of time was in face to face discussion of above.

## 2017-08-22 ENCOUNTER — Ambulatory Visit: Payer: BLUE CROSS/BLUE SHIELD | Admitting: Obstetrics & Gynecology

## 2017-08-22 ENCOUNTER — Ambulatory Visit (INDEPENDENT_AMBULATORY_CARE_PROVIDER_SITE_OTHER): Payer: BLUE CROSS/BLUE SHIELD

## 2017-08-22 ENCOUNTER — Encounter: Payer: BLUE CROSS/BLUE SHIELD | Admitting: Obstetrics and Gynecology

## 2017-08-22 VITALS — BP 138/96 | HR 100 | Resp 16

## 2017-08-22 DIAGNOSIS — N926 Irregular menstruation, unspecified: Secondary | ICD-10-CM | POA: Diagnosis not present

## 2017-08-22 DIAGNOSIS — E282 Polycystic ovarian syndrome: Secondary | ICD-10-CM | POA: Diagnosis not present

## 2017-08-22 DIAGNOSIS — Z0184 Encounter for antibody response examination: Secondary | ICD-10-CM

## 2017-08-22 DIAGNOSIS — N921 Excessive and frequent menstruation with irregular cycle: Secondary | ICD-10-CM | POA: Diagnosis not present

## 2017-08-22 NOTE — Progress Notes (Signed)
22 y.o. G41P0000 Married Caucasian female here for pelvic ultrasound due to episode of menorrhagia.  She was seen yesterday for this.  She was started on norethindrone.  She is currently taking 10 mg twice daily.  Her bleeding is much improved today.  She reports she feels better.  Denies pelvic pain, fever, low back pain.    Patient's last menstrual period was 08/12/2017.  Contraception: None  Findings:  UTERUS: 7.0 x 5.3 x 3.1 cm EMS: 4.0 mm ADNEXA: Left ovary: 4.5 x 3.3 x 2.8 cm with a 3.3 x 2.4 x 2.9 cm cyst as well as a second 1.0 x 0.9cm cust       Right ovary: 3.8 x 2.7 x 2.7 cm enlarged.  With many small follicles present.  Findings suspicious for PCOS findings reviewed.  Possible PCO S discussed. CUL DE SAC: No free fluid  Discussion: Risks due to weight discussed.  Relation to infertility, anovulation, and irregular cycles discussed.  She is very surprised by this finding today.  She has many questions.  Information provided.  We will should proceed with additional evaluation at this time.  Patient aware she may need ovulation induction for pregnancy desires..  Assessment: Menorrhagia that is much improved Enlarged right ovary with multiple small follicles, consistent with PCOS Morbid obesity  Plan: We will proceed with FSH/LH, fasting insulin, total testosterone levels.  Hemoglobin A1c will also be obtained. Rubella status obtained.  Patient is going to continue on prenatal vitamins. Patient is going to continue to norethindrone, 10 mg, daily until bleeding is completely stopped.  Then she will drop down to 10 mg a day.  After another week she may drop down to 5 mg daily.  Patient will stop after 3 weeks and have a cycle most likely  ~30 minutes spent with patient >50% of time was in face to face discussion of above.  Patient had many questions today.  These were all addressed individually. Marland Kitchen

## 2017-08-22 NOTE — Patient Instructions (Signed)
Polycystic Ovarian Syndrome °Polycystic ovarian syndrome (PCOS) is a common hormonal disorder among women of reproductive age. In most women with PCOS, many small fluid-filled sacs (cysts) grow on the ovaries, and the cysts are not part of a normal menstrual cycle. PCOS can cause problems with your menstrual periods and make it difficult to get pregnant. It can also cause an increased risk of miscarriage with pregnancy. If it is not treated, PCOS can lead to serious health problems, such as diabetes and heart disease. °What are the causes? °The cause of PCOS is not known, but it may be the result of a combination of certain factors, such as: °· Irregular menstrual cycle. °· High levels of certain hormones (androgens). °· Problems with the hormone that helps to control blood sugar (insulin resistance). °· Certain genes. ° °What increases the risk? °This condition is more likely to develop in women who have a family history of PCOS. °What are the signs or symptoms? °Symptoms of PCOS may include: °· Multiple ovarian cysts. °· Infrequent periods or no periods. °· Periods that are too frequent or too heavy. °· Unpredictable periods. °· Inability to get pregnant (infertility) because of not ovulating. °· Increased growth of hair on the face, chest, stomach, back, thumbs, thighs, or toes. °· Acne or oily skin. Acne may develop during adulthood, and it may not respond to treatment. °· Pelvic pain. °· Weight gain or obesity. °· Patches of thickened and dark brown or black skin on the neck, arms, breasts, or thighs (acanthosis nigricans). °· Excess hair growth on the face, chest, abdomen, or upper thighs (hirsutism). ° °How is this diagnosed? °This condition is diagnosed based on: °· Your medical history. °· A physical exam, including a pelvic exam. Your health care provider may look for areas of increased hair growth on your skin. °· Tests, such as: °? Ultrasound. This may be used to examine the ovaries and the lining of the  uterus (endometrium) for cysts. °? Blood tests. These may be used to check levels of sugar (glucose), female hormone (testosterone), and female hormones (estrogen and progesterone) in your blood. ° °How is this treated? °There is no cure for PCOS, but treatment can help to manage symptoms and prevent more health problems from developing. Treatment varies depending on: °· Your symptoms. °· Whether you want to have a baby or whether you need birth control (contraception). ° °Treatment may include nutrition and lifestyle changes along with: °· Progesterone hormone to start a menstrual period. °· Birth control pills to help you have regular menstrual periods. °· Medicines to make you ovulate, if you want to get pregnant. °· Medicine to reduce excessive hair growth. °· Surgery, in severe cases. This may involve making small holes in one or both of your ovaries. This decreases the amount of testosterone that your body produces. ° °Follow these instructions at home: °· Take over-the-counter and prescription medicines only as told by your health care provider. °· Follow a healthy meal plan. This can help you reduce the effects of PCOS. °? Eat a healthy diet that includes lean proteins, complex carbohydrates, fresh fruits and vegetables, low-fat dairy products, and healthy fats. Make sure to eat enough fiber. °· If you are overweight, lose weight as told by your health care provider. °? Losing 10% of your body weight may improve symptoms. °? Your health care provider can determine how much weight loss is best for you and can help you lose weight safely. °· Keep all follow-up visits as told by   your health care provider. This is important. °Contact a health care provider if: °· Your symptoms do not get better with medicine. °· You develop new symptoms. °This information is not intended to replace advice given to you by your health care provider. Make sure you discuss any questions you have with your health care  provider. °Document Released: 10/05/2004 Document Revised: 02/07/2016 Document Reviewed: 11/27/2015 °Elsevier Interactive Patient Education © 2018 [employer] ° °

## 2017-08-23 ENCOUNTER — Telehealth: Payer: Self-pay | Admitting: Obstetrics & Gynecology

## 2017-08-23 ENCOUNTER — Encounter: Payer: Self-pay | Admitting: Obstetrics & Gynecology

## 2017-08-23 NOTE — Telephone Encounter (Signed)
She did not have a ruptured cyst but she did have a cyst on the left ovary that was seen on ultrasound.  This will go away on it's own.

## 2017-08-23 NOTE — Telephone Encounter (Signed)
Dr. Miller -please review and advise. 

## 2017-08-23 NOTE — Telephone Encounter (Signed)
-----   Message from [place], Generic sent at 08/23/2017 11:43 AM EST -----    Dr. Sabra Heck,  Good Morning, I hope you are well. Yesterday and Wednesday we had discussed a severe sharp pain near my ovary I had felt around the time of my bleeding. Is it possible that I had a cyst that ruptured?   Thank you for your time  Latoya Mora

## 2017-08-23 NOTE — Telephone Encounter (Signed)
Spoke with patient, advised as seen below per Dr. Sabra Heck. Patient verbalizes understanding and is agreeable. Will close encounter.

## 2017-08-25 ENCOUNTER — Encounter: Payer: Self-pay | Admitting: Obstetrics & Gynecology

## 2017-08-26 ENCOUNTER — Encounter: Payer: Self-pay | Admitting: Obstetrics & Gynecology

## 2017-08-26 ENCOUNTER — Telehealth: Payer: Self-pay | Admitting: Obstetrics & Gynecology

## 2017-08-26 LAB — TESTOSTERONE, TOTAL, LC/MS/MS: Testosterone, total: 12.1 ng/dL (ref 10.0–55.0)

## 2017-08-26 LAB — FSH/LH
FSH: 2.1 m[IU]/mL
LH: 4.5 m[IU]/mL

## 2017-08-26 LAB — HEMOGLOBIN: HEMOGLOBIN: 11.1 g/dL (ref 11.1–15.9)

## 2017-08-26 LAB — INSULIN, RANDOM: INSULIN: 29.3 u[IU]/mL — ABNORMAL HIGH (ref 2.6–24.9)

## 2017-08-26 LAB — RUBELLA SCREEN: Rubella Antibodies, IGG: 1.18 index (ref >0.99)

## 2017-08-26 NOTE — Telephone Encounter (Signed)
-----   Message from [place], Generic sent at 08/26/2017 7:39 AM EST -----    Dr. Sabra Heck,  Good morning. I have been taking the norethindrone since our visit on Wednesday. The bleeding had completely stopped by Friday morning. I took a dose Friday and Saturday. I did not take one on Sunday and the bleeding began again (lightly) and I am having dull cramps and backaches. Do I need to continue taking it and for how long? Thank you  Governor Rooks

## 2017-08-26 NOTE — Telephone Encounter (Signed)
Patient returned call to nurse Jill. °

## 2017-08-26 NOTE — Telephone Encounter (Signed)
Left message to call Edger Husain at [phone].  

## 2017-08-26 NOTE — Telephone Encounter (Signed)
Spoke with patient, patient requesting to clarify norethindrone instructions?   Advised per OV dated 08/22/17:  norethindrone, 10 mg, twice daily until bleeding is completely stopped.  Then drop down to 10 mg a day.  After another week may drop down to 5 mg 91/2 tablet) daily. Will stop after 3 weeks and have a cycle most likely.   Patient verbalizes understanding, read back instructions, is agreeable.   Routing to provider for final review. Patient is agreeable to disposition. Will close encounter.

## 2017-08-28 ENCOUNTER — Telehealth: Payer: Self-pay | Admitting: *Deleted

## 2017-08-28 LAB — SPECIMEN STATUS REPORT

## 2017-08-28 LAB — HGB A1C W/O EAG: HEMOGLOBIN A1C: 5.6 % (ref 4.8–5.6)

## 2017-08-28 MED ORDER — METFORMIN HCL 500 MG PO TABS: ORAL_TABLET | ORAL | 0 refills | Status: DC

## 2017-08-28 NOTE — Telephone Encounter (Signed)
-----  Message from Megan Salon, MD sent at 08/28/2017  6:53 AM EST ----- Please let patient know her [place], LH and testosterone levels were normal.  Her rubella status is immune and she does not need another MMR vaccine.  However, her insulin level was elevated at 29.  She does need to start metformin 500 mg daily for 2 weeks and then increase to twice daily.  I did test her for diabetes and her hemoglobin A1c was normal.  She may have a lot of questions and if she does please recommend an office visit.  If she does not have a lot of questions, then I would like to see her in a month after she started the metformin.  It is okay to send a prescription to the pharmacy for metformin 500 mg every morning times 14 days then increase to twice daily.  Please advise her that the metformin can cause GI distress and/or diarrhea and if this occurs please have her call.

## 2017-08-28 NOTE — Telephone Encounter (Addendum)
Spoke with patient, advised as seen below per Dr. Sabra Heck. OV scheduled for f/u on 09/30/17 at 2:30pm with Dr. Sabra Heck. Metformin to [id number]. Patient verbalizes understanding and is agreeable.   Routing to provider for final review. Patient is agreeable to disposition. Will close encounter.

## 2017-08-28 NOTE — Telephone Encounter (Signed)
Notes recorded by Burnice Logan, RN on 08/28/2017 at 12:10 PM EST Left message to call Sharee Pimple at [phone].

## 2017-09-01 ENCOUNTER — Encounter: Payer: Self-pay | Admitting: Obstetrics & Gynecology

## 2017-09-02 ENCOUNTER — Telehealth: Payer: Self-pay | Admitting: Obstetrics & Gynecology

## 2017-09-02 ENCOUNTER — Other Ambulatory Visit: Payer: Self-pay | Admitting: Obstetrics & Gynecology

## 2017-09-02 DIAGNOSIS — N921 Excessive and frequent menstruation with irregular cycle: Secondary | ICD-10-CM

## 2017-09-02 DIAGNOSIS — N926 Irregular menstruation, unspecified: Secondary | ICD-10-CM

## 2017-09-02 NOTE — Telephone Encounter (Signed)
Patient sent the following correspondence through [place]. Routing to triage to assist patient with request.  ----- Message from [place], Generic sent at 09/01/2017 1:08 PM EDT -----    Dr. Sabra Heck,  I am having a hard time switching from 2 pills to 1 a day to stop my bleeding. I took two for several days after the bleeding stopped, then went down to one and as soon as I did I started experiencing bleeding and cramping.   Thank you for your time  Latoya Mora   Last seen: 08/22/17

## 2017-09-02 NOTE — Telephone Encounter (Signed)
Patient returned call to nurse, Sharee Pimple.

## 2017-09-02 NOTE — Telephone Encounter (Signed)
I think she needs an endometrial biopsy.  Please schedule.  She can stay at the BID dosing for now.

## 2017-09-02 NOTE — Telephone Encounter (Signed)
Spoke with patient. Started aygestin 10 mg bid on 2/27. Reports bleeding restarted when dose reduced to 10mg  daily and cramping is "extremley painful". Reports flow as "much lighter than before medication, more than spotting". Patient went back to 10 mg bid.  Patient previously advised to taper dose, is concerned bleeding does not stop and cramping increases.   Advised will review with Dr. Sabra Heck and return call with recommendations, patient agreeable.   Dr. Sabra Heck -please review.

## 2017-09-02 NOTE — Telephone Encounter (Signed)
Left message to call Kaelani Kendrick at [phone].  

## 2017-09-02 NOTE — Telephone Encounter (Signed)
Spoke with patient, advised as seen below per Dr. Sabra Heck. EMB scheduled for 09/04/17 at 11:30am with Dr. Sabra Heck. Brief explanation of EMB provided, questions answered. Advised to take Motrin 800 mg with food and water one hour before procedure.  Order placed for endometrial biopsy.   Routing to [place] for [employer].   Routing to provider for final review. Patient is agreeable to disposition. Will close encounter.  Cc: Dr. Sabra Heck

## 2017-09-04 ENCOUNTER — Encounter: Payer: Self-pay | Admitting: Obstetrics & Gynecology

## 2017-09-04 ENCOUNTER — Ambulatory Visit: Payer: BLUE CROSS/BLUE SHIELD | Admitting: Obstetrics & Gynecology

## 2017-09-04 DIAGNOSIS — N921 Excessive and frequent menstruation with irregular cycle: Secondary | ICD-10-CM | POA: Diagnosis not present

## 2017-09-04 DIAGNOSIS — N926 Irregular menstruation, unspecified: Secondary | ICD-10-CM | POA: Diagnosis not present

## 2017-09-04 LAB — POCT URINE PREGNANCY: Preg Test, Ur: NEGATIVE

## 2017-09-04 NOTE — Progress Notes (Signed)
GYNECOLOGY  VISIT  CC:   Endometrial bipsy  HPI: 22 y.o. G44P0000 Married Caucasian female here for endometrial biopsy.  Pt has continued to bleed even with the aygestin.  If decreases to one tablet, will cramp and bleed.  Has no bleeding if taking 2 a day.    Reports she started metformin about a week ago.  Having some waves of nausea.  Had diarrhea daily but it is improving.  Has not increased dosage and is advised not to due this until GI changes have stabilized.    Endometrial biopsy recommended due to amount of irregular bleeding.  Pt aware risk of cancer is extremely rare but biopsy would help with possible cause of bleeding and to rule out endometrial hyperplasia.  GYNECOLOGIC HISTORY: Patient's last menstrual period was 08/12/2017. Contraception: none Menopausal hormone therapy: none  Patient Active Problem List   Diagnosis Date Noted  . Obesity, Class III, BMI 40-49.9 (morbid obesity) ([place]) 04/03/2017  . Seasonal allergies 09/18/2016  . Anxiety and depression 09/18/2016  . Frequent headaches 09/18/2016    Past Medical History:  Diagnosis Date  . Allergy   . Anxiety   . Depression   . Frequent headaches     History reviewed. No pertinent surgical history.  MEDS:   Current Outpatient Medications on File Prior to Visit  Medication Sig Dispense Refill  . clonazePAM (KLONOPIN) 0.5 MG tablet Take 1 tablet (0.5 mg total) by mouth daily as needed for anxiety. 20 tablet 0  . diphenhydrAMINE (BENADRYL) 25 mg capsule Take 25 mg by mouth at bedtime as needed.    . metFORMIN (GLUCOPHAGE) 500 MG tablet Take 1 tablet po every morning for 14 days, then increase to twice daily. 46 tablet 0  . norethindrone (AYGESTIN) 5 MG tablet One tablet BID until bleeding stops.  Then take daily. 45 tablet 0  . venlafaxine XR (EFFEXOR-XR) 150 MG 24 hr capsule Take 1 capsule (150 mg total) by mouth daily with breakfast. 90 capsule 4   No current facility-administered medications on file prior to  visit.     ALLERGIES: Etodolac; Meloxicam; Flexeril [cyclobenzaprine]; and Prozac [fluoxetine hcl]  Family History  Problem Relation Age of Onset  . Alcohol abuse Mother   . Drug abuse Mother   . Depression Mother   . Anxiety disorder Mother   . Heart disease Mother   . Skin cancer Mother   . Scoliosis Mother   . Alcohol abuse Father   . Bipolar disorder Father   . Depression Father   . Osteochondroma Father   . COPD Maternal Grandmother   . Scoliosis Maternal Grandmother   . COPD Maternal Grandfather   . Skin cancer Maternal Grandfather   . Arthritis Paternal Grandmother   . Bipolar disorder Paternal Grandfather   . Cancer Neg Hx     SH:  Married, non smoker  Review of Systems  Gastrointestinal: Positive for diarrhea and nausea.       Bloating  Genitourinary:       Excess bleeding Abnormal discharge Unscheduled bleeding   All other systems reviewed and are negative.   PHYSICAL EXAMINATION:    BP (!) 142/88 (BP Location: Right Arm, Patient Position: Sitting, Cuff Size: Large)   Pulse 100   Temp 98.2 F (36.8 C) (Oral)   Resp 16   Wt (!) 323 lb (146.5 kg)   LMP 08/12/2017   BMI 49.11 kg/m     General appearance: alert, cooperative and appears stated age Abdomen: soft, non-tender; bowel sounds  normal; no masses,  no organomegaly  Pelvic: External genitalia:  no lesions              Urethra:  normal appearing urethra with no masses, tenderness or lesions              Bartholins and Skenes: normal                 Vagina: normal appearing vagina with normal color and discharge, no lesions              Cervix: no lesions              Bimanual Exam:  Uterus:  normal size, contour, position, consistency, mobility, non-tender              Adnexa: no mass, fullness, tenderness  Endometrial biopsy recommended.  Discussed with patient.  Verbal and written consent obtained.   Procedure:  Speculum placed.  Cervix visualized and cleansed with betadine prep.  A single  toothed tenaculum was applied to the anterior lip of the cervix.  Endometrial pipelle was advanced through the cervix into the endometrial cavity without difficulty.  Pipelle passed to 7cm.  Suction applied and pipelle removed with good tissue sample obtained.  Tenculum removed.  No bleeding noted.  Patient tolerated procedure well.  Chaperone was present for exam.  Assessment: DUB Morbid obesity Elevated fasting insulin  Plan: Biopsy pending.  If negative, will just have pt stop progesterone and have a withdrawal cycle and start Femara 7.5mg  days 5-9 of cycle.  Administration, risks for twins and triplets, side effects discussed.  Pt comfortable with plan.   ~20 minutes spent with patient >50% of time was in face to face discussion of above.

## 2017-09-09 ENCOUNTER — Encounter: Payer: Self-pay | Admitting: Obstetrics & Gynecology

## 2017-09-09 ENCOUNTER — Telehealth: Payer: Self-pay | Admitting: Obstetrics & Gynecology

## 2017-09-09 ENCOUNTER — Other Ambulatory Visit: Payer: Self-pay | Admitting: Obstetrics & Gynecology

## 2017-09-09 ENCOUNTER — Telehealth: Payer: Self-pay

## 2017-09-09 NOTE — Telephone Encounter (Signed)
Spoke with patient and she did not request refill. Will refuse refill on Aygestin. She will call when she starts cycle.

## 2017-09-09 NOTE — Telephone Encounter (Signed)
Spoke with patient and notified of negative biopsy results and of Dr.Miller's further recommendations below. Patient states she has continued to bleed some daily with 2 Aygestin daily. Advised this should stop. Continue for the recommended 21 days then stop and have a cycle. Advised to call when cycle starts and will start Femara 7.5mg  days 5-9 and do progesterone level on day 23. Emphasized she must call when bleeding starts. Routed to [place]

## 2017-09-09 NOTE — Telephone Encounter (Signed)
Per Dr Sanjuan Dame note she should use the aygestin 2 x a day for 21 days and then stop. Does she need a refill?

## 2017-09-09 NOTE — Telephone Encounter (Signed)
Medication refill request: Aygestin  Last AEX:  N/A Last OV: 08-22-17  Next AEX: not scheduled Last MMG (if hormonal medication request): N/A Refill authorized: please advise

## 2017-09-09 NOTE — Telephone Encounter (Signed)
See phone note of 09-09-17 sent to [place] with this information.

## 2017-09-09 NOTE — Telephone Encounter (Signed)
-----   Message from Megan Salon, MD sent at 09/09/2017  7:54 AM EDT ----- Please let pt know her biopsy was negative.  She's needs to finish 21 days of taking the aygestin and then stop and have a cycle.  When bleeding starts, she is going to take Femara 7.5mg  days 5-9 of her cycle and do a day 23 progesterone level.  She needs to call when bleeding starts.  She should be taking 2 aygestin daily right now.  Thanks.

## 2017-09-09 NOTE — Telephone Encounter (Signed)
-----   Message from [place], Generic sent at 09/09/2017 9:23 AM EDT -----    Dr. Sabra Heck,  I am getting in touch with you just to let you know that I have been bleeding for a week while taking two pills a day. Since before our most recent visit I've been taking the two doses and I have been bleeding regularly since then. I know my biopsy results will be in soon I just wanted to keep you updated on the situation. Thank you.  Latoya Mora

## 2017-09-11 ENCOUNTER — Telehealth: Payer: Self-pay | Admitting: Obstetrics & Gynecology

## 2017-09-11 ENCOUNTER — Encounter: Payer: Self-pay | Admitting: Obstetrics & Gynecology

## 2017-09-11 DIAGNOSIS — N970 Female infertility associated with anovulation: Secondary | ICD-10-CM

## 2017-09-11 MED ORDER — LETROZOLE 2.5 MG PO TABS: 7.5000 mg | ORAL_TABLET | Freq: Every day | ORAL | 0 refills | Status: DC

## 2017-09-11 NOTE — Telephone Encounter (Signed)
Reviewed EMB results and recommendations dated 09/04/17 per Dr. Sabra Heck, will review with covering provider.    Dr. Quincy Simmonds, ok to send RX for Femara 7.5 mg days 5-9 of cycle and schedule day 23 progesterone?    Cc: Dr. Sabra Heck

## 2017-09-11 NOTE — Telephone Encounter (Signed)
Spoke with patient. Advised as seen below per Dr. Quincy Simmonds. Rx for Femara to verified pharmacy. Patient previously scheduled for med f/u on 4/8 with Dr. Sabra Heck, asking if lab work can be collected at [facility] in [place], lives in [place]. Advised will place future order for outside facility, day 23 progesterone to be drawn on 10/02/17. Please notify office once labs are drawn.    Future lab order placed for Progesterone at outside [place] location and released.   Routing to provider for final review. Patient is agreeable to disposition. Will close encounter.  Cc: Dr. Sabra Heck

## 2017-09-11 NOTE — Telephone Encounter (Signed)
Patient returned call to nurse Jill. °

## 2017-09-11 NOTE — Telephone Encounter (Signed)
Patient sent the following message through [place]. Routing to triage as high priority to assist patient with request.  ----- Message from [place], Generic sent at 09/11/2017 8:54 AM EDT -----    Dr. Sabra Heck,   I took my last dose of norethindrone yesterday around 1 pm. I was having relatively light bleeding for the past few days Last night I bled pretty heavily. And this morning I have been having bad cramping and a steady flow. Is this the beginning of my cycle? I am unsure because of the bleeding that has happened the past few weeks. If so, will you call in the new medication I am supposed to start?  Thank you,  Latoya Mora   Last seen 09/04/17 for procedure.

## 2017-09-11 NOTE — Telephone Encounter (Signed)
Left message to call Sharee Pimple at [phone].  1. Rx for Femara pended. First day of menses 09/10/17. Take days 5-9 of cycle, 3/23 -3/27.   2. Schedule day 23 progesterone for 10/02/17.

## 2017-09-11 NOTE — Telephone Encounter (Signed)
She should consider her bleeding yesterday as the start of her menstruation.  She may start the Femara as directed.  Please schedule day 23 progesterone.   Cc- Dr. Sabra Heck

## 2017-09-16 ENCOUNTER — Other Ambulatory Visit: Payer: Self-pay | Admitting: Obstetrics & Gynecology

## 2017-09-16 DIAGNOSIS — N970 Female infertility associated with anovulation: Secondary | ICD-10-CM

## 2017-09-16 NOTE — Progress Notes (Signed)
Day 23 progesterone order placed for pt to do at outside [facility] location.

## 2017-09-18 ENCOUNTER — Encounter: Payer: Self-pay | Admitting: Obstetrics & Gynecology

## 2017-09-18 NOTE — Telephone Encounter (Signed)
Patient sent the following correspondence through [place]. Routing to triage to assist patient with request.  ----- Message from [place], Generic sent at 09/18/2017 9:03 AM EDT -----    Dr. Sabra Heck,  I understand I am supposed to have blood work done on the 10th of April. I was just curious as to what the test is for, and if I need to come to your office or go to a local [facility]. Thanks  Governor Rooks.    Called patient and left a message to call back to speak with nurse if she has questions or just schedule her lab appointment.

## 2017-09-18 NOTE — Telephone Encounter (Signed)
Spoke with patient. Patient started her menses on 09/10/17. Has taken Femara 3/23 -3/27. Day 23 progesterone is scheduled for 10/02/2017. Advised this will let us know if she ovulated this month. Patient verbalizes understanding and will go to an outside [place] to have this done. Will call once this has been completed to ensure we receive results.   Routing to provider for final review. Patient agreeable to disposition. Will close encounter.

## 2017-09-24 ENCOUNTER — Other Ambulatory Visit: Payer: Self-pay | Admitting: Obstetrics & Gynecology

## 2017-09-24 NOTE — Telephone Encounter (Signed)
Medication refill request: Metformin 500mg  #46 Last AEX:  Last ov 09-04-17 Next AEX: Next ov 09-30-17 Last MMG (if hormonal medication request): n/a Refill authorized: please advise--is she to continue or just keep f/u on 09-30-17?

## 2017-09-27 ENCOUNTER — Encounter: Payer: Self-pay | Admitting: Obstetrics & Gynecology

## 2017-09-27 NOTE — Telephone Encounter (Signed)
Can you please confirm with her if she is taking the metformin daily or BID so I can adjust RX.  Also, I thought she was going to do the lab work at an outside [facility], so the appt for 09/30/17 may not be needed.  Can you confirm with her?  Per Latoya Mora's last triage note, order for lab work at outside facility (Day 23 progesterone) had been placed.  Thanks.

## 2017-09-27 NOTE — Telephone Encounter (Signed)
Pt is coming for appt on Monday, so ok to let this wait until that time.

## 2017-09-30 ENCOUNTER — Encounter: Payer: Self-pay | Admitting: Obstetrics & Gynecology

## 2017-09-30 ENCOUNTER — Other Ambulatory Visit: Payer: Self-pay

## 2017-09-30 ENCOUNTER — Ambulatory Visit (INDEPENDENT_AMBULATORY_CARE_PROVIDER_SITE_OTHER): Payer: BLUE CROSS/BLUE SHIELD | Admitting: Obstetrics & Gynecology

## 2017-09-30 VITALS — BP 136/80 | HR 102 | Resp 16 | Ht 68.0 in | Wt 319.0 lb

## 2017-09-30 DIAGNOSIS — R102 Pelvic and perineal pain: Secondary | ICD-10-CM

## 2017-09-30 DIAGNOSIS — E282 Polycystic ovarian syndrome: Secondary | ICD-10-CM | POA: Diagnosis not present

## 2017-09-30 DIAGNOSIS — T887XXA Unspecified adverse effect of drug or medicament, initial encounter: Secondary | ICD-10-CM

## 2017-09-30 DIAGNOSIS — R350 Frequency of micturition: Secondary | ICD-10-CM

## 2017-09-30 LAB — POCT URINALYSIS DIPSTICK
BILIRUBIN UA: NEGATIVE
Blood, UA: NEGATIVE
GLUCOSE UA: NEGATIVE
Ketones, UA: NEGATIVE
Nitrite, UA: NEGATIVE
PH UA: 5 (ref 5.0–8.0)
Protein, UA: NEGATIVE
Urobilinogen, UA: 0.2 E.U./dL

## 2017-09-30 NOTE — Progress Notes (Signed)
GYNECOLOGY  VISIT  CC:   Pelvic pain, anovulation, PCOS  HPI: 22 y.o. G82P0000 Married Caucasian female here for follow up.  Pt has PCOS and is anovulatory and desires pregnancy.  Has started glucophage and has increased the dosage to '500mg'$  BID.  Had stopped having diarrhea with this on the qday dosing but now with the BID dosing is back with diarrhea.  Having looser BM's twice daily.  Not watery.  Does not feel she wants to decrease back to q day dosing right now.  She did take Femara this month.  Having hot flahses with this.  Reviewed this is possible side effect.  Since last Thursday, she is also having low pelvic pain/cramping.  No fevers.  Maybe she is having some mild urinary symptoms.  No flank pain.  She did an ovulation predictor kit and she used it last week.  Had peak day via the kid as Tuesday of last week, April 2nd.  Took Femara from March 3/23-3/27.  Will have day 23 progesterone level done on Wednesday at outside [facility] office.  On PNV.   Associated with the pelvic pain is bloating.  Denies nausea.    GYNECOLOGIC HISTORY: Patient's last menstrual period was 09/10/2017. Contraception: none Menopausal hormone therapy: none  Patient Active Problem List   Diagnosis Date Noted  . Obesity, Class III, BMI 40-49.9 (morbid obesity) ([place]) 04/03/2017  . Seasonal allergies 09/18/2016  . Anxiety and depression 09/18/2016  . Frequent headaches 09/18/2016    Past Medical History:  Diagnosis Date  . Allergy   . Anxiety   . Depression   . Frequent headaches     History reviewed. No pertinent surgical history.  MEDS:   Current Outpatient Medications on File Prior to Visit  Medication Sig Dispense Refill  . clonazePAM (KLONOPIN) 0.5 MG tablet Take 1 tablet (0.5 mg total) by mouth daily as needed for anxiety. 20 tablet 0  . letrozole (FEMARA) 2.5 MG tablet Take 3 tablets (7.5 mg total) by mouth daily. Days 5-9 of cycle. 15 tablet 0  . metFORMIN (GLUCOPHAGE) 500 MG tablet Take 1  tablet po every morning for 14 days, then increase to twice daily. 46 tablet 0  . venlafaxine XR (EFFEXOR-XR) 150 MG 24 hr capsule Take 1 capsule (150 mg total) by mouth daily with breakfast. 90 capsule 4   No current facility-administered medications on file prior to visit.     ALLERGIES: Etodolac; Meloxicam; Flexeril [cyclobenzaprine]; and Prozac [fluoxetine hcl]  Family History  Problem Relation Age of Onset  . Alcohol abuse Mother   . Drug abuse Mother   . Depression Mother   . Anxiety disorder Mother   . Heart disease Mother   . Skin cancer Mother   . Scoliosis Mother   . Alcohol abuse Father   . Bipolar disorder Father   . Depression Father   . Osteochondroma Father   . COPD Maternal Grandmother   . Scoliosis Maternal Grandmother   . COPD Maternal Grandfather   . Skin cancer Maternal Grandfather   . Arthritis Paternal Grandmother   . Bipolar disorder Paternal Grandfather   . Cancer Neg Hx     SH:  Married, non smoker  Review of Systems  Gastrointestinal: Positive for nausea and vomiting.       Bloating  Genitourinary: Positive for frequency.  Neurological: Positive for headaches.  Psychiatric/Behavioral:       Excessive crying   All other systems reviewed and are negative.   PHYSICAL EXAMINATION:  BP 136/80 (BP Location: Right Arm, Patient Position: Sitting, Cuff Size: Large)   Pulse (!) 102   Resp 16   Ht '5\' 8"'$  (1.727 m)   Wt (!) 319 lb (144.7 kg)   LMP 09/10/2017   BMI 48.50 kg/m     General appearance: alert, cooperative and appears stated age CV:  Regular rate and rhythm Lungs:  clear to auscultation, no wheezes, rales or rhonchi, symmetric air entry Abdomen: soft, mild tenderness to deep palpation in RLQ; bowel sounds normal; no masses,  no organomegaly.  Does not have an acute abdomen.  Pelvic: External genitalia:  no lesions              Urethra:  normal appearing urethra with no masses, tenderness or lesions              Bartholins and  Skenes: normal                 Vagina: normal appearing vagina with normal color and discharge, no lesions              Cervix: no lesions              Bimanual Exam:  Uterus:  normal size, contour, position, consistency, mobility, non-tender              Adnexa: no mass, fullness, tenderness  Chaperone was present for exam.  Assessment: PCOS in anovulatory pt Probable ovulation due to current symptoms, timing of medication and positive OPK done at home Hot flashes with Femara  Plan: Feel pt has ovulated and these are likely cause of symptoms.  However, if worsen, she should call for ultrasound.   Day 23 progesterone level will be obtained on Wednesday.  Pap order given as well. Will change to Clomid '50mg'$  days 5-9 next month due to side effects with Femara Continue '500mg'$  BID dosing of Glucophage.  If looser stools do not improve, would consider decreasing again to just q day dosing. Urine culture pending   ~25 minutes spent with patient >50% of time was in face to face discussion of above.

## 2017-10-01 ENCOUNTER — Encounter: Payer: Self-pay | Admitting: Obstetrics & Gynecology

## 2017-10-01 ENCOUNTER — Telehealth: Payer: Self-pay | Admitting: Obstetrics & Gynecology

## 2017-10-01 LAB — URINE CULTURE

## 2017-10-01 NOTE — Telephone Encounter (Signed)
-----   Message from [place], Generic sent at 10/01/2017 10:04 AM EDT -----    Dr. Sabra Heck,  Last night I woke up with severe pain in my uterus and right ovary. It lasted for around an hour and I was getting a bit dizzy from the pain. The pain eased in my uterus but kept in my right ovary for quite a bit longer. I considered going to the ER because the pain was so bad. I assume this is probably a result of my exam yesterday, making me more tender than I was earlier. But I wanted to run it by you in case it could be something more serious.   Thank you for your time  Latoya Mora

## 2017-10-01 NOTE — Telephone Encounter (Signed)
Spoke with patient in regards to [employer]. Pain and dizziness has resolved. Still feels discomfort when bladder gets full. 2/10 on pain scale currently. Intermittent nausea since increasing metformin. Increased urinary frequency, has increased fluid intake since starting metformin.  Denies bleeding, vaginal d/c, fever/chills, vomiting.  Taking tylenol 1g q6 prn for pain.  Advised urine culture results pending, will notify once completed and reviewed.   ER precautions provided should pain return or new symptoms develop after hours.  Dr. Sabra Heck will review, our office will return call with any additional recommendations. Patient verbalizes understanding.

## 2017-10-01 NOTE — Telephone Encounter (Signed)
Left message to call Jazlynn Nemetz at [phone].  

## 2017-10-02 ENCOUNTER — Telehealth: Payer: Self-pay | Admitting: Obstetrics & Gynecology

## 2017-10-02 NOTE — Telephone Encounter (Signed)
Spoke with patient. Patient states that she got her day 23 progesterone level checked this morning at the [facility] in [place]. Advised will notify Dr.Miller as this has not resulted in the system yet. Will return call once results have returned.

## 2017-10-02 NOTE — Telephone Encounter (Signed)
Patient called to let us know that she got lab work done per Dr. Ammie Ferrier request.

## 2017-10-03 DIAGNOSIS — E282 Polycystic ovarian syndrome: Secondary | ICD-10-CM | POA: Insufficient documentation

## 2017-10-03 LAB — PROGESTERONE: PROGESTERONE: 7.3 ng/mL

## 2017-10-03 NOTE — Telephone Encounter (Signed)
Result note routed to you with results from Progesterone and urine culture.  If has questions before calling, please see me.  Thanks.

## 2017-10-03 NOTE — Telephone Encounter (Signed)
Spoke with patient. Results given. Patient verbalizes understanding. Will monitor for menses. If no menses by Thursday will take UPT in the morning and report results. If starts menses she will contact the office to plan for Clomid. Patient reports she is feeling much better. No current symptoms.  Routing to provider for final review. Patient agreeable to disposition. Will close encounter.

## 2017-10-03 NOTE — Telephone Encounter (Signed)
Left message to call [place] at [phone].  Notes recorded by Megan Salon, MD on 10/03/2017 at 6:39 AM EDT Please let pt know her [facility] [facility] was 7 which is at the low border of ovulation. If cycle starts on time, then we will be sure she ovulated. She should start cycle Tuesday or Wednesday if ovulated. If no ovulation by Thursday, take UPT in the AM and call the office. Will change to Clomid this next month as we discussed if starts cycle.  Also, urine culture is negative. How is she feeling? Thanks.

## 2017-10-07 ENCOUNTER — Encounter: Payer: Self-pay | Admitting: Obstetrics & Gynecology

## 2017-10-07 ENCOUNTER — Telehealth: Payer: Self-pay | Admitting: Obstetrics & Gynecology

## 2017-10-07 NOTE — Telephone Encounter (Signed)
Message   ----- Message from [place], Generic sent at 10/07/2017 10:53 AM EDT -----    Dr. Sabra Heck,  Since I have started taking the 2 doses of metformin I have noticed that I am feeling really bad after eating, usually after a larger meal. It's almost a feeling of alcohol consumption (lightheadedness and dizziness). It lasts for hours after. Do I need to change my diet or stop with the two doses? Thanks  Governor Rooks

## 2017-10-07 NOTE — Telephone Encounter (Signed)
I advised her at the last visit that it was ok to go back to the once a day dosing if she could not tolerate the BID dosing.  Please have her go back to the once daily dosing.  Thanks.

## 2017-10-07 NOTE — Telephone Encounter (Signed)
Left message to call Tallis Soledad at [phone]. 

## 2017-10-07 NOTE — Telephone Encounter (Signed)
Spoke with patient. Patient states that she has been taking Metformin 500 mg twice a day for around 3 weeks. Reports after she eats a full meal she feels zoned, dizzy, and light headed. Reports this can last any where from 1-6 hours. Has not made any changes to other medications. Reports this occurred yesterday after lunch and lasted until 8 pm. "I would not have trusted myself to operate a vehicle if I needed to." Reports this has been occurring since increasing her medication dosage, but has worsened recently. Advised will review with Dr.Miller and return call.

## 2017-10-07 NOTE — Telephone Encounter (Signed)
Spoke with patient. Advised of message as seen below from Dr.Miller. Patient verbalizes understanding. Encounter closed. 

## 2017-10-08 ENCOUNTER — Other Ambulatory Visit: Payer: Self-pay | Admitting: Obstetrics & Gynecology

## 2017-10-08 MED ORDER — CLOMIPHENE CITRATE 50 MG PO TABS: ORAL_TABLET | ORAL | 0 refills | Status: DC

## 2017-10-08 NOTE — Telephone Encounter (Signed)
Routing to [place] for review of Clomid rx. Patient has not yet started her menses, but will be leaving to go out of town tomorrow and is requesting Clomid in case menses starts tomorrow.

## 2017-10-08 NOTE — Telephone Encounter (Signed)
Medication refill request: Femara Last AEX:  Last OV 09-30-17 Next AEX: none scheduled Last MMG (if hormonal medication request): n/a Refill authorized: please advise

## 2017-10-08 NOTE — Telephone Encounter (Signed)
OK to send in rx for clomid 50mg  days 5-9 of cycle.  She will need to call and let us know when cycle starts and what days she takes it to schedule progesterone.  If takes days 5-9, will need day 23 progesterone.  Thanks.

## 2017-10-08 NOTE — Telephone Encounter (Signed)
Spoke with patient. Advised of message as seen below from [place]. Patient verbalizes understanding. Rx for Clomid 50 mg po days 5-9 of cycle #5 0RF sent to pharmacy on file. Patient verbalizes understanding. Will take UPT if no menses by Thursday. Will call with results. Will call if starts menses to plan day 23 progesterone level.  Routing to provider for final review. Patient agreeable to disposition. Will close encounter.

## 2017-10-08 NOTE — Telephone Encounter (Signed)
Left message to call Kaitlyn at [phone]. 

## 2017-10-08 NOTE — Telephone Encounter (Signed)
Patient called to request a refill on "clomid." She said she's not started her cycle yet but is leaving to go out of town to [place] after Wednesday, 10/09/17. Patient requesting to get the medication now so she is prepared when her cycle starts. Patient also stated she is concerned the pharmacy in [place] may not have the medication. Patient is at work today and said it's okay to leave a detailed message since she may not be available to take calls.  Pharmacy on file confirmed.

## 2017-10-10 ENCOUNTER — Encounter: Payer: Self-pay | Admitting: Obstetrics & Gynecology

## 2017-10-10 ENCOUNTER — Telehealth: Payer: Self-pay | Admitting: Obstetrics & Gynecology

## 2017-10-10 NOTE — Telephone Encounter (Signed)
Spoke with patient. Advised of message as seen below from Dr.Miller. Patient verbalizes understanding. Encounter closed. 

## 2017-10-10 NOTE — Telephone Encounter (Signed)
Spoke with patient. Patient would like to schedule viability scan. First day of LMP was 09/10/17. Patient was scan as soon as possible. Advised this needs to be done between 6-8 weeks. Appointment scheduled for 10/22/2017 at 3 pm with 3:30 pm consult with Dr.Miller. This will be exactly 6 weeks. Patient does not want to wait longer. Advised will review scheduling with Dr.Miller to ensure timing is appropriate. Patient is taking Metformin 500 mg daily and Effexor XR 150 mg daily. Asking if these are safe to continue. Advised will review with Dr.Miller and return call.

## 2017-10-10 NOTE — Telephone Encounter (Signed)
Patient sent the following correspondence through [place]. Routing to triage to assist patient with request.  Cc: Dr. Lucrezia Starch, and [facility] for [place].  ----- Message from [place], Generic sent at 10/10/2017 7:51 AM EDT -----      My first day of the last cycle was March 19  ----- Message -----  From: Megan Salon, MD  Sent: 10/10/17 6:39 AM  To: Audre Longo  Subject: RE: Non-Urgent Medical Question    Well that's just great!! You need an ultrasound. What was the first day of your last menstrual cycle? Just want to confirm and then we will plan the ultrasound from there!!       Edwinna Areola      ----- Message -----   From: Governor Rooks   Sent: 10/10/2017 5:51 AM EDT    To: Megan Salon, MD  Subject: Non-Urgent Medical Question    Good morning Dr. Sabra Heck,  I have taken three at home pregnancy tests and all have been positive!   Please let me know what my next steps should be, and I would appreciate a recommendation on an obgyn that you think will be good for me.   Thank you for all you have done to help me!  Governor Rooks

## 2017-10-10 NOTE — Telephone Encounter (Signed)
Metformin is fine to continue.  Effexor is a class C medication so this means no know birth defects but that information is somewhat limited.  I think she should stay on this until the six week scan and we will discuss further then.

## 2017-10-12 ENCOUNTER — Encounter: Payer: Self-pay | Admitting: Obstetrics & Gynecology

## 2017-10-14 ENCOUNTER — Telehealth: Payer: Self-pay | Admitting: Obstetrics & Gynecology

## 2017-10-14 NOTE — Telephone Encounter (Signed)
Patient sent the following correspondence through [place]. Routing to triage to assist patient with request.  ----- Message from [place], Generic sent at 10/12/2017 2:45 PM EDT -----    Dr. Sabra Heck,   I have an appointment scheduled to get a hepb booster on the 30th. Is this something that is safe or should I cancel it?  Thanks   Latoya Mora

## 2017-10-14 NOTE — Telephone Encounter (Signed)
Routing to covering provider.   Dr. Quincy Simmonds, please review and advise. Patient is [redacted] wks pregnant.

## 2017-10-14 NOTE — Telephone Encounter (Signed)
Spoke with patient. Advised as seen below per Dr. Quincy Simmonds. 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 Patient thankful for return call and verbalizes understanding. Will close encounter.

## 2017-10-14 NOTE — Telephone Encounter (Signed)
Conventional hep B vaccine with Recombinvax HB or Engerix-B is indicated if she needs to complete the hep B series or if she at risk for exposure to hep B during pregnancy.

## 2017-10-18 ENCOUNTER — Encounter: Payer: Self-pay | Admitting: Obstetrics & Gynecology

## 2017-10-18 ENCOUNTER — Telehealth: Payer: Self-pay | Admitting: Obstetrics & Gynecology

## 2017-10-18 NOTE — Telephone Encounter (Signed)
Spoke with patient after reviewing with Dr.Silva. Advised patient should take Tylenol for pain relief. If she absolutely needs something more can take lowest dose of Norco. Okay to take Amoxicillin if needed. Patient verbalizes understanding.  Routing to provider for final review.

## 2017-10-18 NOTE — Telephone Encounter (Addendum)
Message   ----- Message from [place], Generic sent at 10/18/2017 1:11 PM EDT -----    Dr. Sabra Heck,  I am currently at the dentist and I have a broken tooth that will need to be extracted next week. My dentist (Dr. Melynda Keller in [place]), needs to have approval from you as far as pain medication and antibiotic until my surgery, because I am under your care currently for pregnancy and haven't been referred to an OB yet. Thank you  Latoya Mora       Patient is at her dentist and news to know what type of antibiotic and pain medicine she is allowed to take. Please call ASAP. Patient is waiting at the dentist.

## 2017-10-18 NOTE — Telephone Encounter (Signed)
I did give these recommendations which the triage nurse has given to the patient.  Encounter closed.

## 2017-10-18 NOTE — Telephone Encounter (Signed)
Patient is at the dental office and needs a dental extraction with a surgeon next week. Until she is able to be seen they are asking to give the patient pain medication and an antibiotic if needed. They usually prescribe Norco 750 or Tylenol-Codeine for pain and Amoxicillin 500 mg. Patient is [redacted] weeks pregnant. Advised will review with covering provider and return call.

## 2017-10-21 ENCOUNTER — Other Ambulatory Visit: Payer: Self-pay | Admitting: *Deleted

## 2017-10-21 DIAGNOSIS — Z3201 Encounter for pregnancy test, result positive: Secondary | ICD-10-CM

## 2017-10-22 ENCOUNTER — Encounter: Payer: Self-pay | Admitting: Obstetrics & Gynecology

## 2017-10-22 ENCOUNTER — Ambulatory Visit (INDEPENDENT_AMBULATORY_CARE_PROVIDER_SITE_OTHER): Payer: BLUE CROSS/BLUE SHIELD

## 2017-10-22 ENCOUNTER — Ambulatory Visit: Payer: BLUE CROSS/BLUE SHIELD | Admitting: Obstetrics & Gynecology

## 2017-10-22 VITALS — BP 128/96 | HR 92 | Ht 68.0 in | Wt 319.0 lb

## 2017-10-22 DIAGNOSIS — N912 Amenorrhea, unspecified: Secondary | ICD-10-CM | POA: Diagnosis not present

## 2017-10-22 DIAGNOSIS — Z3201 Encounter for pregnancy test, result positive: Secondary | ICD-10-CM | POA: Diagnosis not present

## 2017-10-22 NOTE — Progress Notes (Signed)
22 y.o. G35P0000 Married Caucasian female here for pelvic ultrasound for viability.  LMP 09/10/17.  EGA 6 0/7 weeks.  Reports no nausea but having a lot of fatigue and significant breast tenderness.  Denies vaginal bleeding or discharge.  Findings:  UTERUS: normal appearing gestational sac and yolk sac.  Fetal pole noted with CRL of 0.28cm.  Measurement c/w 5 6/7 weeks (CWD).  FCA at 98 BPM noted.   ADNEXA: Left ovary: elongated cystic structure 4.6 x 3.4 x 4.3cm that is either an elongated cyst or hydrosalpinx, avascular       Right ovary:  6.0 x 3.8 x 4.7cm with simple 5.2 x 4.3cm cyst, abascular CUL DE SAC: no free fluid  Discussion:  Findings reviewed with pt.  Risk of torsion on right side reviewed as well.  Aware follow up will be needed but will likely be done with ob.  Reviewed options for pt.  Names/practices reviewed.  Pt is taking PNV.  Has to have broken tooth extracted next week.  Needs letter for this.  Advised medications that are safe around time of tooth extraction.  Pt will call if has other questions about medication.  Advised not to change kitty litter.  Tdap is unknown.  Had chicken pox as a child.  Aware flu vaccine safe and recommended.   Fish/shellfish/mercury discuss.  Patient likes sushi and is aware should not eat raw or undercooked fish, meat, pork.  Unpasteurized cheese/juices discussed.  Nitrites in foods disucssed.  Exercise and intercourse discussed.  Down's Syndrome testing discussed.  Cystic fibrosis discussed.    Medications reviewed.  She will stop Metformin at 8 weeks unless directed otherwise in ob office.  Effexor reviewed.  Up to Date reviewed.  No evidence of teratogenic findings in humans noted although findings with animals has been seen.  Pt is also aware of risks of withdrawal with infant after delivery.  Does not feel she wants to make any change now but will want to try and wean in third trimester if possible.  Will review at new ob appt as  well.  Assessment:  Nelda Marseille IUD, viable 5.0cm right ovary cyst Possible elongated left cyst vs hydrosalpinx  Plan:  Transfer to ob office is not appropriate.  Transfer of records may be needed.  Will handle this if needed per pt request.  ~30 minutes spent with patient >50% of time was in face to face discussion of above.

## 2017-10-23 ENCOUNTER — Telehealth: Payer: Self-pay | Admitting: *Deleted

## 2017-10-23 NOTE — Telephone Encounter (Signed)
LMOVM that she would need a new ob appointment.  Advised to f/u with her current OB regarding her medications until we can see her.

## 2017-10-28 ENCOUNTER — Telehealth: Payer: Self-pay | Admitting: Obstetrics & Gynecology

## 2017-10-28 NOTE — Telephone Encounter (Signed)
Spoke with Vicente Males at [facility]. Was advised that patient cancelled her procedure due to her concern/risk with pregnancy.   Advised reviewed with Dr. Sabra Heck prior to return call, ok to proceed with tooth extraction and allograft socket preservation. Reviewed letter dated 10/22/17 and recommendations per Dr. Sabra Heck.   Vicente Males states the concern from Dr. Maudie Mercury was the bone for graft would be coming from cadaver, is this safe during pregnancy? Patient has cancelled procedure, would like to know for future reference.   Advised will review with Dr. Sabra Heck and return call with recommendations?

## 2017-10-28 NOTE — Telephone Encounter (Signed)
Anna with [facility] calling to speak with nurse regarding a tooth extraction procedure and if a allograft socket preservation is safe because patient is pregnant. Office number is [phone].

## 2017-10-28 NOTE — Telephone Encounter (Signed)
Left message to call Arieal Cuoco at [phone].  

## 2017-11-04 ENCOUNTER — Encounter: Payer: Self-pay | Admitting: Women's Health

## 2017-11-04 ENCOUNTER — Ambulatory Visit (INDEPENDENT_AMBULATORY_CARE_PROVIDER_SITE_OTHER): Payer: BLUE CROSS/BLUE SHIELD | Admitting: Women's Health

## 2017-11-04 ENCOUNTER — Ambulatory Visit: Payer: BLUE CROSS/BLUE SHIELD | Admitting: *Deleted

## 2017-11-04 ENCOUNTER — Other Ambulatory Visit: Payer: Self-pay

## 2017-11-04 VITALS — BP 142/92 | HR 85 | Wt 321.0 lb

## 2017-11-04 DIAGNOSIS — D271 Benign neoplasm of left ovary: Secondary | ICD-10-CM | POA: Insufficient documentation

## 2017-11-04 DIAGNOSIS — N83209 Unspecified ovarian cyst, unspecified side: Secondary | ICD-10-CM

## 2017-11-04 DIAGNOSIS — F329 Major depressive disorder, single episode, unspecified: Secondary | ICD-10-CM

## 2017-11-04 DIAGNOSIS — O3481 Maternal care for other abnormalities of pelvic organs, first trimester: Secondary | ICD-10-CM

## 2017-11-04 DIAGNOSIS — Z331 Pregnant state, incidental: Secondary | ICD-10-CM

## 2017-11-04 DIAGNOSIS — F418 Other specified anxiety disorders: Secondary | ICD-10-CM

## 2017-11-04 DIAGNOSIS — N83201 Unspecified ovarian cyst, right side: Secondary | ICD-10-CM

## 2017-11-04 DIAGNOSIS — E282 Polycystic ovarian syndrome: Secondary | ICD-10-CM

## 2017-11-04 DIAGNOSIS — O0990 Supervision of high risk pregnancy, unspecified, unspecified trimester: Secondary | ICD-10-CM | POA: Insufficient documentation

## 2017-11-04 DIAGNOSIS — Z1389 Encounter for screening for other disorder: Secondary | ICD-10-CM | POA: Diagnosis not present

## 2017-11-04 DIAGNOSIS — O99341 Other mental disorders complicating pregnancy, first trimester: Secondary | ICD-10-CM

## 2017-11-04 DIAGNOSIS — N83202 Unspecified ovarian cyst, left side: Secondary | ICD-10-CM

## 2017-11-04 DIAGNOSIS — F32A Depression, unspecified: Secondary | ICD-10-CM

## 2017-11-04 DIAGNOSIS — R030 Elevated blood-pressure reading, without diagnosis of hypertension: Secondary | ICD-10-CM

## 2017-11-04 DIAGNOSIS — Z3401 Encounter for supervision of normal first pregnancy, first trimester: Secondary | ICD-10-CM

## 2017-11-04 DIAGNOSIS — F419 Anxiety disorder, unspecified: Secondary | ICD-10-CM

## 2017-11-04 DIAGNOSIS — O9989 Other specified diseases and conditions complicating pregnancy, childbirth and the puerperium: Secondary | ICD-10-CM | POA: Diagnosis not present

## 2017-11-04 DIAGNOSIS — Z3A01 Less than 8 weeks gestation of pregnancy: Secondary | ICD-10-CM

## 2017-11-04 DIAGNOSIS — O99281 Endocrine, nutritional and metabolic diseases complicating pregnancy, first trimester: Secondary | ICD-10-CM | POA: Diagnosis not present

## 2017-11-04 LAB — POCT URINALYSIS DIPSTICK
GLUCOSE UA: NEGATIVE
Ketones, UA: NEGATIVE
LEUKOCYTES UA: NEGATIVE
Nitrite, UA: NEGATIVE
Protein, UA: NEGATIVE
RBC UA: NEGATIVE

## 2017-11-04 LAB — OB RESULTS CONSOLE GC/CHLAMYDIA: Gonorrhea: NEGATIVE

## 2017-11-04 MED ORDER — VENLAFAXINE HCL 75 MG PO TABS: 75.0000 mg | ORAL_TABLET | Freq: Every day | ORAL | 0 refills | Status: DC

## 2017-11-04 NOTE — Patient Instructions (Addendum)
Latoya Mora, I greatly value your feedback.  If you receive a survey following your visit with [place] today, we appreciate you taking the time to fill it out.  Thanks, Knute Neu, CNM, WHNP-BC  You will have your sugar test next visit.  Please do not eat or drink anything after midnight the night before you come, not even water.  You will be here for at least two hours.      Nausea & Vomiting  Have saltine crackers or pretzels by your bed and eat a few bites before you raise your head out of bed in the morning  Eat small frequent meals throughout the day instead of large meals  Drink plenty of fluids throughout the day to stay hydrated, just don't drink a lot of fluids with your meals.  This can make your stomach fill up faster making you feel sick  Do not brush your teeth right after you eat  Products with real ginger are good for nausea, like ginger ale and ginger hard candy Make sure it says made with real ginger!  Sucking on sour candy like lemon heads is also good for nausea  If your prenatal vitamins make you nauseated, take them at night so you will sleep through the nausea  Sea Bands  If you feel like you need medicine for the nausea & vomiting please let us know  If you are unable to keep any fluids or food down please let us know   Constipation  Drink plenty of fluid, preferably water, throughout the day  Eat foods high in fiber such as fruits, vegetables, and grains  Exercise, such as walking, is a good way to keep your bowels regular  Drink warm fluids, especially warm prune juice, or decaf coffee  Eat a 1/2 cup of real oatmeal (not instant), 1/2 cup applesauce, and 1/2-1 cup warm prune juice every day  If needed, you may take Colace (docusate sodium) stool softener once or twice a day to help keep the stool soft. If you are pregnant, wait until you are out of your first trimester (12-14 weeks of pregnancy)  If you still are having problems with constipation, you  may take Miralax once daily as needed to help keep your bowels regular.  If you are pregnant, wait until you are out of your first trimester (12-14 weeks of pregnancy)   First Trimester of Pregnancy The first trimester of pregnancy is from week 1 until the end of week 12 (months 1 through 3). A week after a sperm fertilizes an egg, the egg will implant on the wall of the uterus. This embryo will begin to develop into a baby. Genes from you and your partner are forming the baby. The female genes determine whether the baby is a boy or a girl. At 6-8 weeks, the eyes and face are formed, and the heartbeat can be seen on ultrasound. At the end of 12 weeks, all the baby's organs are formed.  Now that you are pregnant, you will want to do everything you can to have a healthy baby. Two of the most important things are to get good prenatal care and to follow your health care provider's instructions. Prenatal care is all the medical care you receive before the baby's birth. This care will help prevent, find, and treat any problems during the pregnancy and childbirth. BODY CHANGES Your body goes through many changes during pregnancy. The changes vary from woman to woman.   You may gain or lose  a couple of pounds at first.  You may feel sick to your stomach (nauseous) and throw up (vomit). If the vomiting is uncontrollable, call your health care provider.  You may tire easily.  You may develop headaches that can be relieved by medicines approved by your health care provider.  You may urinate more often. Painful urination may mean you have a bladder infection.  You may develop heartburn as a result of your pregnancy.  You may develop constipation because certain hormones are causing the muscles that push waste through your intestines to slow down.  You may develop hemorrhoids or swollen, bulging veins (varicose veins).  Your breasts may begin to grow larger and become tender. Your nipples may stick out  more, and the tissue that surrounds them (areola) may become darker.  Your gums may bleed and may be sensitive to brushing and flossing.  Dark spots or blotches (chloasma, mask of pregnancy) may develop on your face. This will likely fade after the baby is born.  Your menstrual periods will stop.  You may have a loss of appetite.  You may develop cravings for certain kinds of food.  You may have changes in your emotions from day to day, such as being excited to be pregnant or being concerned that something may go wrong with the pregnancy and baby.  You may have more vivid and strange dreams.  You may have changes in your hair. These can include thickening of your hair, rapid growth, and changes in texture. Some women also have hair loss during or after pregnancy, or hair that feels dry or thin. Your hair will most likely return to normal after your baby is born. WHAT TO EXPECT AT YOUR PRENATAL VISITS During a routine prenatal visit:  You will be weighed to make sure you and the baby are growing normally.  Your blood pressure will be taken.  Your abdomen will be measured to track your baby's growth.  The fetal heartbeat will be listened to starting around week 10 or 12 of your pregnancy.  Test results from any previous visits will be discussed. Your health care provider may ask you:  How you are feeling.  If you are feeling the baby move.  If you have had any abnormal symptoms, such as leaking fluid, bleeding, severe headaches, or abdominal cramping.  If you have any questions. Other tests that may be performed during your first trimester include:  Blood tests to find your blood type and to check for the presence of any previous infections. They will also be used to check for low iron levels (anemia) and Rh antibodies. Later in the pregnancy, blood tests for diabetes will be done along with other tests if problems develop.  Urine tests to check for infections, diabetes, or  protein in the urine.  An ultrasound to confirm the proper growth and development of the baby.  An amniocentesis to check for possible genetic problems.  Fetal screens for spina bifida and Down syndrome.  You may need other tests to make sure you and the baby are doing well. HOME CARE INSTRUCTIONS  Medicines  Follow your health care provider's instructions regarding medicine use. Specific medicines may be either safe or unsafe to take during pregnancy.  Take your prenatal vitamins as directed.  If you develop constipation, try taking a stool softener if your health care provider approves. Diet  Eat regular, well-balanced meals. Choose a variety of foods, such as meat or vegetable-based protein, fish, milk and low-fat dairy  products, vegetables, fruits, and whole grain breads and cereals. Your health care provider will help you determine the amount of weight gain that is right for you.  Avoid raw meat and uncooked cheese. These carry germs that can cause birth defects in the baby.  Eating four or five small meals rather than three large meals a day may help relieve nausea and vomiting. If you start to feel nauseous, eating a few soda crackers can be helpful. Drinking liquids between meals instead of during meals also seems to help nausea and vomiting.  If you develop constipation, eat more high-fiber foods, such as fresh vegetables or fruit and whole grains. Drink enough fluids to keep your urine clear or pale yellow. Activity and Exercise  Exercise only as directed by your health care provider. Exercising will help you:  Control your weight.  Stay in shape.  Be prepared for labor and delivery.  Experiencing pain or cramping in the lower abdomen or low back is a good sign that you should stop exercising. Check with your health care provider before continuing normal exercises.  Try to avoid standing for long periods of time. Move your legs often if you must stand in one place for  a long time.  Avoid heavy lifting.  Wear low-heeled shoes, and practice good posture.  You may continue to have sex unless your health care provider directs you otherwise. Relief of Pain or Discomfort  Wear a good support bra for breast tenderness.   Take warm sitz baths to soothe any pain or discomfort caused by hemorrhoids. Use hemorrhoid cream if your health care provider approves.   Rest with your legs elevated if you have leg cramps or low back pain.  If you develop varicose veins in your legs, wear support hose. Elevate your feet for 15 minutes, 3-4 times a day. Limit salt in your diet. Prenatal Care  Schedule your prenatal visits by the twelfth week of pregnancy. They are usually scheduled monthly at first, then more often in the last 2 months before delivery.  Write down your questions. Take them to your prenatal visits.  Keep all your prenatal visits as directed by your health care provider. Safety  Wear your seat belt at all times when driving.  Make a list of emergency phone numbers, including numbers for family, friends, the hospital, and police and fire departments. General Tips  Ask your health care provider for a referral to a local prenatal education class. Begin classes no later than at the beginning of month 6 of your pregnancy.  Ask for help if you have counseling or nutritional needs during pregnancy. Your health care provider can offer advice or refer you to specialists for help with various needs.  Do not use hot tubs, steam rooms, or saunas.  Do not douche or use tampons or scented sanitary pads.  Do not cross your legs for long periods of time.  Avoid cat litter boxes and soil used by cats. These carry germs that can cause birth defects in the baby and possibly loss of the fetus by miscarriage or stillbirth.  Avoid all smoking, herbs, alcohol, and medicines not prescribed by your health care provider. Chemicals in these affect the formation and  growth of the baby.  Schedule a dentist appointment. At home, brush your teeth with a soft toothbrush and be gentle when you floss. SEEK MEDICAL CARE IF:   You have dizziness.  You have mild pelvic cramps, pelvic pressure, or nagging pain in the abdominal area.  You have persistent nausea, vomiting, or diarrhea.  You have a bad smelling vaginal discharge.  You have pain with urination.  You notice increased swelling in your face, hands, legs, or ankles. SEEK IMMEDIATE MEDICAL CARE IF:   You have a fever.  You are leaking fluid from your vagina.  You have spotting or bleeding from your vagina.  You have severe abdominal cramping or pain.  You have rapid weight gain or loss.  You vomit blood or material that looks like coffee grounds.  You are exposed to [place] measles and have never had them.  You are exposed to fifth disease or chickenpox.  You develop a severe headache.  You have shortness of breath.  You have any kind of trauma, such as from a fall or a car accident. Document Released: 06/05/2001 Document Revised: 10/26/2013 Document Reviewed: 04/21/2013 [facility] Patient Information 2015 [place], [place]. This information is not intended to replace advice given to you by your health care provider. Make sure you discuss any questions you have with your health care provider.

## 2017-11-04 NOTE — Progress Notes (Signed)
INITIAL OBSTETRICAL VISIT Patient name: [name] MRN [id number]  Date of birth: Jan 18, 1996 Chief Complaint:   Initial Prenatal Visit (ovarian cyst pain)  History of Present Illness:   Latoya Mora is a 22 y.o. G37P0000 Caucasian female at [redacted]w[redacted]d by LMP c/w 5wk u/s, with an Estimated Date of Delivery: 06/17/18 being seen today for her initial obstetrical visit.   Her obstetrical history is significant for primigravida, PCOS requiring metformin and femara to conceive, has been seeing Dr. Sabra Heck w/ [facility], she was instructed to stop metformin at 8wks and she has already done this. On viability scan 10/22/17 at 5wks she had an elongated cystic structure 4.6x3.4x4.3cm on Lt and 5.2x4.3cm Rt ovarian cyst.  Denies h/o HTN, states bp's usually normal Today she reports currently on effexor xr 150mg  daily x 8-42mths for dep/anx, definitely feels it has been helping, however after discussing w/ previous MD she does not feel benefits outweigh risks, so wants to wean off of it asap. .  Patient's last menstrual period was 09/10/2017 (lmp unknown). Last pap 09/24/15. Results were: normal Review of Systems:   Pertinent items are noted in HPI Denies cramping/contractions, leakage of fluid, vaginal bleeding, abnormal vaginal discharge w/ itching/odor/irritation, headaches, visual changes, shortness of breath, chest pain, abdominal pain, severe nausea/vomiting, or problems with urination or bowel movements unless otherwise stated above.  Pertinent History Reviewed:  Reviewed past medical,surgical, social, obstetrical and family history.  Reviewed problem list, medications and allergies. OB History  Gravida Para Term Preterm AB Living  1 0 0 0 0 0  SAB TAB Ectopic Multiple Live Births  0 0 0 0 0    # Outcome Date GA Lbr Len/2nd Weight Sex Delivery Anes PTL Lv  1 Current            Physical Assessment:   Vitals:   11/04/17 1001  BP: (!) 142/92  Pulse: 85  Weight: (!) 321 lb (145.6  kg)  Body mass index is 48.81 kg/m.       Physical Examination:  General appearance - well appearing, and in no distress  Mental status - alert, oriented to person, place, and time  Psych:  She has a normal mood and affect  Skin - warm and dry, normal color, no suspicious lesions noted  Chest - effort normal, all lung fields clear to auscultation bilaterally  Heart - normal rate and regular rhythm  Abdomen - soft, nontender  Extremities:  No swelling or varicosities noted  Thin prep pap is not done  Fetal Heart Rate (bpm): 166 u/s via informal transabdominal u/s by amber  Results for orders placed or performed in visit on 11/04/17 (from the past 24 hour(s))  POCT urinalysis dipstick   Collection Time: 11/04/17 10:25 AM  Result Value Ref Range   Color, UA     Clarity, UA     Glucose, UA neg    Bilirubin, UA     Ketones, UA neg    Spec Grav, UA  1.010 - 1.025   Blood, UA neg    pH, UA  5.0 - 8.0   Protein, UA neg    Urobilinogen, UA  0.2 or 1.0 E.U./dL   Nitrite, UA neg    Leukocytes, UA Negative Negative   Appearance     Odor      Assessment & Plan:  1) Low-Risk Pregnancy G1P0000 at [redacted]w[redacted]d with an Estimated Date of Delivery: 06/17/18   2) Initial OB visit  3) Dep/anx>wants to wean off  effexor- discussed w/ J[facility], do 75mg  x 7d, then 37.5mg  x 7d, new rx sent as pt currently has capsules. If notices dep/anx much worse- go back on meds.   4) Bilateral ovarian cysts> will re-assess at nt u/s  5) PCOS> requiring metformin and femara to conceive. Will get early 2hr GTT. A1C on 08/23/17 was 5.6  6) Elevated BP> denies h/o HTN, will monitor  Meds:  Meds ordered this encounter  Medications  . venlafaxine (EFFEXOR) 75 MG tablet    Sig: Take 1 tablet (75 mg total) by mouth daily. X 7 days, then 1/2 tablet (37.5mg  total) daily x 7 days    Dispense:  14 tablet    Refill:  0    Order Specific Question:   Supervising Provider    Answer:   Tania Ade H [[id number]]    Initial labs  obtained Continue prenatal vitamins Reviewed n/v relief measures and warning s/s to report Reviewed recommended weight gain based on pre-gravid BMI Encouraged well-balanced diet Genetic Screening discussed Integrated Screen: requested Cystic fibrosis screening discussed requested Ultrasound discussed; fetal survey: requested CCNC completed>not sent, not applying for preg [place]  Follow-up: Return for asap for early 2hr GTT and BP check (no visit), then 4wks for LROB.   Orders Placed This Encounter  Procedures  . Urine Culture  . GC/Chlamydia Probe Amp  . Obstetric Panel, Including HIV  . Urinalysis, Routine w reflex microscopic  . Cystic Fibrosis Mutation 97  . POCT urinalysis dipstick    [name] CNM, [facility] 11/04/2017 2:00 PM

## 2017-11-06 ENCOUNTER — Telehealth: Payer: Self-pay | Admitting: Women's Health

## 2017-11-06 LAB — URINE CULTURE

## 2017-11-06 LAB — GC/CHLAMYDIA PROBE AMP
Chlamydia trachomatis, NAA: NEGATIVE
NEISSERIA GONORRHOEAE BY PCR: NEGATIVE

## 2017-11-06 NOTE — Telephone Encounter (Signed)
Patient called stating that she came in not to long ago to see kim for her new Ob and at the time pt was not feeling sick. Pt states that lately she has been feeling so sick she has missed work Pt would like for Kim to call her in something for nausea. Please contact pt

## 2017-11-07 MED ORDER — DOXYLAMINE-PYRIDOXINE ER 20-20 MG PO TBCR: 1.0000 | EXTENDED_RELEASE_TABLET | Freq: Every day | ORAL | 8 refills | Status: DC

## 2017-11-07 NOTE — Telephone Encounter (Signed)
LMOVM that Latoya Mora was sent to [place] in [facility] and should be getting a call from.  In the meantime she could pick up a sample here in our office.

## 2017-11-08 LAB — CYSTIC FIBROSIS MUTATION 97: GENE DIS ANAL CARRIER INTERP BLD/T-IMP: NOT DETECTED

## 2017-11-08 LAB — OBSTETRIC PANEL, INCLUDING HIV
Antibody Screen: NEGATIVE
BASOS ABS: 0.1 10*3/uL (ref 0.0–0.2)
Basos: 1 %
EOS (ABSOLUTE): 0.1 10*3/uL (ref 0.0–0.4)
Eos: 1 %
HEMOGLOBIN: 11.1 g/dL (ref 11.1–15.9)
HEP B S AG: NEGATIVE
HIV Screen 4th Generation wRfx: NONREACTIVE
Hematocrit: 36.5 % (ref 34.0–46.6)
IMMATURE GRANULOCYTES: 0 %
Immature Grans (Abs): 0 10*3/uL (ref 0.0–0.1)
Lymphocytes Absolute: 3.1 10*3/uL (ref 0.7–3.1)
Lymphs: 31 %
MCH: 23.9 pg — ABNORMAL LOW (ref 26.6–33.0)
MCHC: 30.4 g/dL — ABNORMAL LOW (ref 31.5–35.7)
MCV: 79 fL (ref 79–97)
MONOCYTES: 6 %
Monocytes Absolute: 0.7 10*3/uL (ref 0.1–0.9)
NEUTROS ABS: 6.2 10*3/uL (ref 1.4–7.0)
NEUTROS PCT: 61 %
PLATELETS: 288 10*3/uL (ref 150–379)
RBC: 4.64 x10E6/uL (ref 3.77–5.28)
RDW: 16.3 % — ABNORMAL HIGH (ref 12.3–15.4)
RPR: NONREACTIVE
RUBELLA: 1.09 {index} (ref >0.99)
Rh Factor: POSITIVE
WBC: 10.1 10*3/uL (ref 3.4–10.8)

## 2017-11-08 LAB — URINALYSIS, ROUTINE W REFLEX MICROSCOPIC
BILIRUBIN UA: NEGATIVE
Glucose, UA: NEGATIVE
Ketones, UA: NEGATIVE
Leukocytes, UA: NEGATIVE
Nitrite, UA: NEGATIVE
PH UA: 5.5 (ref 5.0–7.5)
Protein, UA: NEGATIVE
RBC, UA: NEGATIVE
Specific Gravity, UA: 1.022 (ref 1.005–1.030)
UUROB: 0.2 mg/dL (ref 0.2–1.0)

## 2017-11-11 ENCOUNTER — Other Ambulatory Visit: Payer: BLUE CROSS/BLUE SHIELD

## 2017-11-11 ENCOUNTER — Ambulatory Visit (INDEPENDENT_AMBULATORY_CARE_PROVIDER_SITE_OTHER): Payer: BLUE CROSS/BLUE SHIELD

## 2017-11-11 VITALS — BP 120/70 | HR 97 | Ht 68.0 in | Wt 319.0 lb

## 2017-11-11 DIAGNOSIS — E282 Polycystic ovarian syndrome: Secondary | ICD-10-CM

## 2017-11-11 DIAGNOSIS — Z0130 Encounter for examination of blood pressure without abnormal findings: Secondary | ICD-10-CM

## 2017-11-11 DIAGNOSIS — Z1389 Encounter for screening for other disorder: Secondary | ICD-10-CM

## 2017-11-11 DIAGNOSIS — Z331 Pregnant state, incidental: Secondary | ICD-10-CM

## 2017-11-11 LAB — POCT URINALYSIS DIPSTICK
Blood, UA: NEGATIVE
GLUCOSE UA: NEGATIVE
Ketones, UA: NEGATIVE
LEUKOCYTES UA: NEGATIVE
NITRITE UA: NEGATIVE
PROTEIN UA: NEGATIVE

## 2017-11-11 NOTE — Progress Notes (Signed)
Pt here for blood pressure check and glucose today. BP 120/70. Kim booker review result. Appointment 12-03-17.Pad CMA

## 2017-11-11 NOTE — Telephone Encounter (Signed)
Dr. Miller -ok to close encounter?  

## 2017-11-11 NOTE — Telephone Encounter (Signed)
Yes, ok to close encounter.  Pt is pregnant and has transferred care so additional recommendations should come from [facility] office.  Thanks.

## 2017-11-11 NOTE — Telephone Encounter (Signed)
Encounter closed

## 2017-11-12 LAB — GLUCOSE TOLERANCE, 2 HOURS W/ 1HR
GLUCOSE, 1 HOUR: 112 mg/dL (ref 65–179)
GLUCOSE, 2 HOUR: 111 mg/dL (ref 65–152)
Glucose, Fasting: 77 mg/dL (ref 65–91)

## 2017-11-15 ENCOUNTER — Encounter: Payer: Self-pay | Admitting: Women's Health

## 2017-12-02 ENCOUNTER — Encounter: Payer: BLUE CROSS/BLUE SHIELD | Admitting: Women's Health

## 2017-12-03 ENCOUNTER — Encounter: Payer: BLUE CROSS/BLUE SHIELD | Admitting: Obstetrics & Gynecology

## 2017-12-10 ENCOUNTER — Inpatient Hospital Stay (HOSPITAL_COMMUNITY)
Admission: AD | Admit: 2017-12-10 | Discharge: 2017-12-11 | Disposition: A | Discharge status: Home / Self Care | Payer: BLUE CROSS/BLUE SHIELD | Source: Ambulatory Visit | Attending: Obstetrics and Gynecology | Admitting: Obstetrics and Gynecology

## 2017-12-10 ENCOUNTER — Other Ambulatory Visit: Payer: Self-pay

## 2017-12-10 ENCOUNTER — Encounter (HOSPITAL_COMMUNITY): Payer: Self-pay

## 2017-12-10 DIAGNOSIS — F329 Major depressive disorder, single episode, unspecified: Secondary | ICD-10-CM | POA: Insufficient documentation

## 2017-12-10 DIAGNOSIS — O99341 Other mental disorders complicating pregnancy, first trimester: Secondary | ICD-10-CM | POA: Diagnosis not present

## 2017-12-10 DIAGNOSIS — J988 Other specified respiratory disorders: Secondary | ICD-10-CM | POA: Insufficient documentation

## 2017-12-10 DIAGNOSIS — Z79899 Other long term (current) drug therapy: Secondary | ICD-10-CM | POA: Diagnosis not present

## 2017-12-10 DIAGNOSIS — Z331 Pregnant state, incidental: Secondary | ICD-10-CM | POA: Diagnosis not present

## 2017-12-10 DIAGNOSIS — Z3A13 13 weeks gestation of pregnancy: Secondary | ICD-10-CM | POA: Insufficient documentation

## 2017-12-10 DIAGNOSIS — F419 Anxiety disorder, unspecified: Secondary | ICD-10-CM | POA: Diagnosis not present

## 2017-12-10 DIAGNOSIS — O99511 Diseases of the respiratory system complicating pregnancy, first trimester: Secondary | ICD-10-CM | POA: Insufficient documentation

## 2017-12-10 DIAGNOSIS — J069 Acute upper respiratory infection, unspecified: Secondary | ICD-10-CM

## 2017-12-10 DIAGNOSIS — E282 Polycystic ovarian syndrome: Secondary | ICD-10-CM | POA: Insufficient documentation

## 2017-12-10 DIAGNOSIS — O99281 Endocrine, nutritional and metabolic diseases complicating pregnancy, first trimester: Secondary | ICD-10-CM | POA: Diagnosis not present

## 2017-12-10 DIAGNOSIS — Z7984 Long term (current) use of oral hypoglycemic drugs: Secondary | ICD-10-CM | POA: Diagnosis not present

## 2017-12-10 DIAGNOSIS — B9789 Other viral agents as the cause of diseases classified elsewhere: Secondary | ICD-10-CM

## 2017-12-10 DIAGNOSIS — R0602 Shortness of breath: Secondary | ICD-10-CM | POA: Diagnosis present

## 2017-12-10 DIAGNOSIS — Z87891 Personal history of nicotine dependence: Secondary | ICD-10-CM | POA: Insufficient documentation

## 2017-12-10 HISTORY — DX: Polycystic ovarian syndrome: E28.2

## 2017-12-10 LAB — URINALYSIS, ROUTINE W REFLEX MICROSCOPIC
BILIRUBIN URINE: NEGATIVE
Glucose, UA: NEGATIVE mg/dL
Hgb urine dipstick: NEGATIVE
Ketones, ur: 20 mg/dL — AB
LEUKOCYTES UA: NEGATIVE
NITRITE: NEGATIVE
PROTEIN: NEGATIVE mg/dL
Specific Gravity, Urine: 1.012 (ref 1.005–1.030)
pH: 6 (ref 5.0–8.0)

## 2017-12-10 MED ORDER — GUAIFENESIN 100 MG/5ML PO SOLN
20.0000 mL | ORAL | Status: DC | PRN
  Administered 2017-12-11: 400 mg via ORAL
  Filled 2017-12-10: qty 15

## 2017-12-10 MED ORDER — IPRATROPIUM-ALBUTEROL 0.5-2.5 (3) MG/3ML IN SOLN
3.0000 mL | Freq: Once | RESPIRATORY_TRACT | Status: AC
  Administered 2017-12-11: 3 mL via RESPIRATORY_TRACT
  Filled 2017-12-10: qty 3

## 2017-12-10 NOTE — MAU Note (Signed)
Pt states she started feeling sick on Sunday night with a cough. States today she started feeling SOB and wheezing. Every day activities have been difficult. States she has a dull achy sensation in her chest that started today and feels like she cannot take a deep breath. Denies history of asthma. States she has body aches and generalized weakness and feeling flu-like. States she had a fever of 100.0 today. States she took Tylenol, but has not rechecked temp. Has not had sick contacts to her knowledge.

## 2017-12-10 NOTE — MAU Provider Note (Signed)
History     CSN: [id number]  Arrival date and time: 12/10/17 2259   First Provider Initiated Contact with Patient 12/10/17 2346      Chief Complaint  Patient presents with  . Shortness of Breath   HPI  Latoya Mora is a 22 y.o. G1P0000 at [redacted]w[redacted]d who presents with SOB, cough, myalgias, & fever/chills.  Symptoms began on Sunday. No sick contacts. Reports cough and wheezing. States cough is at time productive but she doesn't spit up sputum to see what it looks like. She also reports DOE especially when walking around work. Denies hx of asthma and is not a smoker.  Also reports fever/chill at home with temp up to 100.3 earlier today. Associated symptoms include body aches and feeling weak. Denies sore throat, rhinorrhea, ear pain, chest pain, or abdominal pain. No n/v/d. She has taken tylenol for fever/chills but has not treated cough or other symptoms.  States she feels like she has the flu.   OB History    Gravida  1   Para  0   Term  0   Preterm  0   AB  0   Living  0     SAB  0   TAB  0   Ectopic  0   Multiple  0   Live Births  0           Past Medical History:  Diagnosis Date  . Allergy   . Anxiety   . Depression   . Frequent headaches   . PCOS (polycystic ovarian syndrome)     Past Surgical History:  Procedure Laterality Date  . TOOTH EXTRACTION      Family History  Problem Relation Age of Onset  . Alcohol abuse Mother   . Drug abuse Mother   . Depression Mother   . Anxiety disorder Mother   . Heart disease Mother   . Skin cancer Mother   . Scoliosis Mother   . Alcohol abuse Father   . Bipolar disorder Father   . Depression Father   . Osteochondroma Father   . COPD Maternal Grandmother   . Scoliosis Maternal Grandmother   . COPD Maternal Grandfather   . Skin cancer Maternal Grandfather   . Arthritis Paternal Grandmother   . Bipolar disorder Paternal Grandfather   . Osteochondroma Sister   . Osteochondroma Brother   . Cancer Neg Hx      Social History   Tobacco Use  . Smoking status: Former Research scientist (life sciences)  . Smokeless tobacco: Never Used  . Tobacco comment: quit 11/12/2010   Substance Use Topics  . Alcohol use: Not Currently  . Drug use: No    Allergies:  Allergies  Allergen Reactions  . Etodolac Nausea Only  . Meloxicam Other (See Comments)    Abdominal pain  . Flexeril [Cyclobenzaprine] Nausea Only  . Nsaids Other (See Comments)    Stomach pain  . Prozac [Fluoxetine Hcl]     Felt bad/awful    Medications Prior to Admission  Medication Sig Dispense Refill Last Dose  . Doxylamine-Pyridoxine ER (BONJESTA) 20-20 MG TBCR Take 1 tablet by mouth at bedtime. Can add 1 tablet in the morning if needed for nausea and vomiting 60 tablet 8 12/10/2017 at 1800  . Prenatal Vit-Fe Fumarate-FA (PRENATAL MULTIVITAMIN) TABS tablet Take 1 tablet by mouth daily at 12 noon.   12/10/2017 at 0800  . clonazePAM (KLONOPIN) 0.5 MG tablet Take 1 tablet (0.5 mg total) by mouth daily as needed for anxiety. (  Patient not taking: Reported on 10/22/2017) 20 tablet 0 More than a month at Unknown time  . metFORMIN (GLUCOPHAGE) 500 MG tablet Take 1 tablet (500 mg total) by mouth 2 (two) times daily with a meal. (Patient not taking: Reported on 11/11/2017) 60 tablet 12 More than a month at Unknown time  . venlafaxine (EFFEXOR) 75 MG tablet Take 1 tablet (75 mg total) by mouth daily. X 7 days, then 1/2 tablet (37.5mg  total) daily x 7 days 14 tablet 0 More than a month at Unknown time    Review of Systems  Constitutional: Positive for chills, fatigue and fever.  HENT: Negative for ear pain and sore throat.   Respiratory: Positive for cough, shortness of breath and wheezing.   Cardiovascular: Negative for chest pain.  Gastrointestinal: Positive for nausea. Negative for abdominal pain and vomiting.  Genitourinary: Negative.   Musculoskeletal: Positive for myalgias.   Physical Exam   Blood pressure 126/64, pulse (!) 107, temperature 98.6 F (37 C),  temperature source Oral, resp. rate 18, height 5\' 8"  (1.727 m), weight (!) 325 lb (147.4 kg), last menstrual period 09/10/2017, SpO2 100 %.  Physical Exam  Nursing note and vitals reviewed. Constitutional: She is oriented to person, place, and time. She appears well-developed and well-nourished. No distress.  HENT:  Head: Normocephalic and atraumatic.  Right Ear: Tympanic membrane normal.  Left Ear: Tympanic membrane normal.  Mouth/Throat: Oropharynx is clear and moist.  Eyes: Conjunctivae are normal. Right eye exhibits no discharge. Left eye exhibits no discharge. No scleral icterus.  Neck: Normal range of motion.  Cardiovascular: Normal rate, regular rhythm and normal heart sounds.  No murmur heard. Respiratory: Effort normal. No tachypnea. No respiratory distress. She has wheezes (mild wheezing in BLF).  GI: Soft.  Neurological: She is alert and oriented to person, place, and time.  Skin: Skin is warm and dry. She is not diaphoretic.  Psychiatric: She has a normal mood and affect. Her behavior is normal. Judgment and thought content normal.    MAU Course  Procedures Results for orders placed or performed during the hospital encounter of 12/10/17 (from the past 24 hour(s))  Urinalysis, Routine w reflex microscopic     Status: Abnormal   Collection Time: 12/10/17 11:17 PM  Result Value Ref Range   Color, Urine YELLOW YELLOW   APPearance CLEAR CLEAR   Specific Gravity, Urine 1.012 1.005 - 1.030   pH 6.0 5.0 - 8.0   Glucose, UA NEGATIVE NEGATIVE mg/dL   Hgb urine dipstick NEGATIVE NEGATIVE   Bilirubin Urine NEGATIVE NEGATIVE   Ketones, ur 20 (A) NEGATIVE mg/dL   Protein, ur NEGATIVE NEGATIVE mg/dL   Nitrite NEGATIVE NEGATIVE   Leukocytes, UA NEGATIVE NEGATIVE  Influenza panel by PCR (type A & B)     Status: None   Collection Time: 12/10/17 11:47 PM  Result Value Ref Range   Influenza A By PCR NEGATIVE NEGATIVE   Influenza B By PCR NEGATIVE NEGATIVE   Dg Chest 2  View  Result Date: 12/11/2017 CLINICAL DATA:  Cough and shortness of breath since Sunday. Thirteen weeks pregnant. Patient was shielded. EXAM: CHEST - 2 VIEW COMPARISON:  None. FINDINGS: The heart size and mediastinal contours are within normal limits. Both lungs are clear. The visualized skeletal structures are unremarkable. IMPRESSION: No active cardiopulmonary disease. Electronically Signed   By: Lucienne Capers M.D.   On: 12/11/2017 02:00    MDM RN unable to doppler FHT d/t body habitus.  Pt informed that the ultrasound  is considered a limited OB ultrasound and is not intended to be a complete ultrasound exam.  Patient also informed that the ultrasound is not being completed with the intent of assessing for fetal or placental anomalies or any pelvic abnormalities.  Explained that the purpose of today's ultrasound is to assess for  viability.  Patient acknowledges the purpose of the exam and the limitations of the study.   Active IUP with FHR 150s  Pt remains afebrile while in MAU. SpO2 100% Initially tachycardic & with reports of SOB & fevers, chest xray was ordered -- negative  Mild wheezing noted on exam, duoneb given Robitussin given Flu swab negative CBC ordered but pt difficult stick so was cancelled  NAD, VSS  C/w Dr. Matthew Saras. Ok to discharge home with symptomatic tx. Pt to f/u with her pcp if symptoms continue.  Assessment and Plan  A: 1. Viral respiratory infection   2. [redacted] weeks gestation of pregnancy    P: Discharge home Discussed tx of symptoms at home & OTC meds in pregnancy list given F/u with PCP if symptoms continue or worsen  Jorje Guild 12/10/2017, 11:47 PM

## 2017-12-10 NOTE — MAU Note (Signed)
Shortness of breath, chest pain, cold sweats, fever of 100.2 at home, chills, coughing non-productive, extreme fatigue, muscle aches, loss of appetitive, morning sickness. States chest pain hurts when breathing deeply 4/10. Nausea, denies vomiting. Denies abnormal discharge or bleeding.

## 2017-12-11 ENCOUNTER — Inpatient Hospital Stay (HOSPITAL_COMMUNITY): Payer: BLUE CROSS/BLUE SHIELD

## 2017-12-11 LAB — INFLUENZA PANEL BY PCR (TYPE A & B)
INFLBPCR: NEGATIVE
Influenza A By PCR: NEGATIVE

## 2017-12-11 NOTE — Discharge Instructions (Signed)
Safe Medications in Pregnancy   Acne: Benzoyl Peroxide Salicylic Acid  Backache/Headache: Tylenol: 2 regular strength every 4 hours OR              2 Extra strength every 6 hours  Colds/Coughs/Allergies: Benadryl (alcohol free) 25 mg every 6 hours as needed Breath right strips Claritin Cepacol throat lozenges Chloraseptic throat spray Cold-Eeze- up to three times per day Cough drops, alcohol free Flonase (by prescription only) Guaifenesin Mucinex Robitussin DM (plain only, alcohol free) Saline nasal spray/drops Sudafed (pseudoephedrine) & Actifed ** use only after [redacted] weeks gestation and if you do not have high blood pressure Tylenol Vicks Vaporub Zinc lozenges Zyrtec   Constipation: Colace Ducolax suppositories Fleet enema Glycerin suppositories Metamucil Milk of magnesia Miralax Senokot Smooth move tea  Diarrhea: Kaopectate Imodium A-D  *NO pepto Bismol  Hemorrhoids: Anusol Anusol HC Preparation H Tucks  Indigestion: Tums Maalox Mylanta Zantac  Pepcid  Insomnia: Benadryl (alcohol free) 25mg  every 6 hours as needed Tylenol PM Unisom, no Gelcaps  Leg Cramps: Tums MagGel  Nausea/Vomiting:  Bonine Dramamine Emetrol Ginger extract Sea bands Meclizine  Nausea medication to take during pregnancy:  Unisom (doxylamine succinate 25 mg tablets) Take one tablet daily at bedtime. If symptoms are not adequately controlled, the dose can be increased to a maximum recommended dose of two tablets daily (1/2 tablet in the morning, 1/2 tablet mid-afternoon and one at bedtime). Vitamin B6 100mg  tablets. Take one tablet twice a day (up to 200 mg per day).  Skin Rashes: Aveeno products Benadryl cream or 25mg  every 6 hours as needed Calamine Lotion 1% cortisone cream  Yeast infection: Gyne-lotrimin 7 Monistat 7  Gum/tooth pain: Anbesol  **If taking multiple medications, please check labels to avoid duplicating the same active ingredients **take  medication as directed on the label ** Do not exceed 4000 mg of tylenol in 24 hours **Do not take medications that contain aspirin or ibuprofen        Viral Respiratory Infection A respiratory infection is an illness that affects part of the respiratory system, such as the lungs, nose, or throat. Most respiratory infections are caused by either viruses or bacteria. A respiratory infection that is caused by a virus is called a viral respiratory infection. Common types of viral respiratory infections include:  A cold.  The flu (influenza).  A respiratory syncytial virus (RSV) infection.  How do I know if I have a viral respiratory infection? Most viral respiratory infections cause:  A stuffy or runny nose.  Yellow or green nasal discharge.  A cough.  Sneezing.  Fatigue.  Achy muscles.  A sore throat.  Sweating or chills.  A fever.  A headache.  How are viral respiratory infections treated? If influenza is diagnosed early, it may be treated with an antiviral medicine that shortens the length of time a person has symptoms. Symptoms of viral respiratory infections may be treated with over-the-counter and prescription medicines, such as:  Expectorants. These make it easier to cough up mucus.  Decongestant nasal sprays.  Health care providers do not prescribe antibiotic medicines for viral infections. This is because antibiotics are designed to kill bacteria. They have no effect on viruses. How do I know if I should stay home from work or school? To avoid exposing others to your respiratory infection, stay home if you have:  A fever.  A persistent cough.  A sore throat.  A runny nose.  Sneezing.  Muscles aches.  Headaches.  Fatigue.  Weakness.  Chills.  Sweating.  Nausea.  Follow these instructions at home:  Rest as much as possible.  Take over-the-counter and prescription medicines only as told by your health care provider.  Drink enough  fluid to keep your urine clear or pale yellow. This helps prevent dehydration and helps loosen up mucus.  Gargle with a salt-water mixture 3-4 times per day or as needed. To make a salt-water mixture, completely dissolve -1 tsp of salt in 1 cup of warm water.  Use nose drops made from salt water to ease congestion and soften raw skin around your nose.  Do not drink alcohol.  Do not use tobacco products, including cigarettes, chewing tobacco, and e-cigarettes. If you need help quitting, ask your health care provider. Contact a health care provider if:  Your symptoms last for 10 days or longer.  Your symptoms get worse over time.  You have a fever.  You have severe sinus pain in your face or forehead.  The glands in your jaw or neck become very swollen. Get help right away if:  You feel pain or pressure in your chest.  You have shortness of breath.  You faint or feel like you will faint.  You have severe and persistent vomiting.  You feel confused or disoriented. This information is not intended to replace advice given to you by your health care provider. Make sure you discuss any questions you have with your health care provider. Document Released: 03/21/2005 Document Revised: 11/17/2015 Document Reviewed: 11/17/2014 Elsevier Interactive Patient Education  [employer].

## 2017-12-14 ENCOUNTER — Encounter (HOSPITAL_COMMUNITY): Payer: Self-pay

## 2017-12-14 ENCOUNTER — Inpatient Hospital Stay (HOSPITAL_COMMUNITY)
Admission: AD | Admit: 2017-12-14 | Discharge: 2017-12-14 | Disposition: A | Discharge status: Home / Self Care | Payer: BLUE CROSS/BLUE SHIELD | Source: Ambulatory Visit | Attending: Obstetrics and Gynecology | Admitting: Obstetrics and Gynecology

## 2017-12-14 DIAGNOSIS — Z811 Family history of alcohol abuse and dependence: Secondary | ICD-10-CM | POA: Diagnosis not present

## 2017-12-14 DIAGNOSIS — F419 Anxiety disorder, unspecified: Secondary | ICD-10-CM | POA: Insufficient documentation

## 2017-12-14 DIAGNOSIS — Z818 Family history of other mental and behavioral disorders: Secondary | ICD-10-CM | POA: Insufficient documentation

## 2017-12-14 DIAGNOSIS — R197 Diarrhea, unspecified: Secondary | ICD-10-CM | POA: Diagnosis not present

## 2017-12-14 DIAGNOSIS — Z87891 Personal history of nicotine dependence: Secondary | ICD-10-CM | POA: Diagnosis not present

## 2017-12-14 DIAGNOSIS — E860 Dehydration: Secondary | ICD-10-CM

## 2017-12-14 DIAGNOSIS — Z886 Allergy status to analgesic agent status: Secondary | ICD-10-CM | POA: Insufficient documentation

## 2017-12-14 DIAGNOSIS — O9989 Other specified diseases and conditions complicating pregnancy, childbirth and the puerperium: Secondary | ICD-10-CM | POA: Diagnosis not present

## 2017-12-14 DIAGNOSIS — Z3A13 13 weeks gestation of pregnancy: Secondary | ICD-10-CM | POA: Insufficient documentation

## 2017-12-14 DIAGNOSIS — R8271 Bacteriuria: Secondary | ICD-10-CM

## 2017-12-14 DIAGNOSIS — R634 Abnormal weight loss: Secondary | ICD-10-CM | POA: Diagnosis not present

## 2017-12-14 DIAGNOSIS — Z79899 Other long term (current) drug therapy: Secondary | ICD-10-CM | POA: Diagnosis not present

## 2017-12-14 DIAGNOSIS — O99281 Endocrine, nutritional and metabolic diseases complicating pregnancy, first trimester: Secondary | ICD-10-CM | POA: Diagnosis not present

## 2017-12-14 DIAGNOSIS — Z888 Allergy status to other drugs, medicaments and biological substances status: Secondary | ICD-10-CM | POA: Insufficient documentation

## 2017-12-14 DIAGNOSIS — O219 Vomiting of pregnancy, unspecified: Secondary | ICD-10-CM | POA: Diagnosis not present

## 2017-12-14 DIAGNOSIS — Z825 Family history of asthma and other chronic lower respiratory diseases: Secondary | ICD-10-CM | POA: Diagnosis not present

## 2017-12-14 DIAGNOSIS — Z8249 Family history of ischemic heart disease and other diseases of the circulatory system: Secondary | ICD-10-CM | POA: Diagnosis not present

## 2017-12-14 DIAGNOSIS — O26891 Other specified pregnancy related conditions, first trimester: Secondary | ICD-10-CM | POA: Diagnosis not present

## 2017-12-14 DIAGNOSIS — Z808 Family history of malignant neoplasm of other organs or systems: Secondary | ICD-10-CM | POA: Insufficient documentation

## 2017-12-14 DIAGNOSIS — O99280 Endocrine, nutritional and metabolic diseases complicating pregnancy, unspecified trimester: Secondary | ICD-10-CM

## 2017-12-14 DIAGNOSIS — O99341 Other mental disorders complicating pregnancy, first trimester: Secondary | ICD-10-CM | POA: Diagnosis not present

## 2017-12-14 DIAGNOSIS — E282 Polycystic ovarian syndrome: Secondary | ICD-10-CM | POA: Diagnosis not present

## 2017-12-14 DIAGNOSIS — Z3401 Encounter for supervision of normal first pregnancy, first trimester: Secondary | ICD-10-CM

## 2017-12-14 DIAGNOSIS — O26899 Other specified pregnancy related conditions, unspecified trimester: Secondary | ICD-10-CM

## 2017-12-14 LAB — CBC
HEMATOCRIT: 35 % — AB (ref 36.0–46.0)
HEMOGLOBIN: 11.1 g/dL — AB (ref 12.0–15.0)
MCH: 24 pg — ABNORMAL LOW (ref 26.0–34.0)
MCHC: 31.7 g/dL (ref 30.0–36.0)
MCV: 75.8 fL — ABNORMAL LOW (ref 78.0–100.0)
Platelets: 232 10*3/uL (ref 150–400)
RBC: 4.62 MIL/uL (ref 3.87–5.11)
RDW: 15.7 % — ABNORMAL HIGH (ref 11.5–15.5)
WBC: 11.3 10*3/uL — AB (ref 4.0–10.5)

## 2017-12-14 LAB — COMPREHENSIVE METABOLIC PANEL
ALBUMIN: 3.3 g/dL — AB (ref 3.5–5.0)
ALK PHOS: 87 U/L (ref 38–126)
ALT: 29 U/L (ref 14–54)
AST: 22 U/L (ref 15–41)
Anion gap: 10 (ref 5–15)
BILIRUBIN TOTAL: 0.4 mg/dL (ref 0.3–1.2)
BUN: 9 mg/dL (ref 6–20)
CALCIUM: 8.7 mg/dL — AB (ref 8.9–10.3)
CO2: 19 mmol/L — ABNORMAL LOW (ref 22–32)
Chloride: 103 mmol/L (ref 101–111)
Creatinine, Ser: 0.46 mg/dL (ref 0.44–1.00)
GFR calc Af Amer: >60 mL/min (ref >60)
GFR calc non Af Amer: >60 mL/min (ref >60)
Glucose, Bld: 87 mg/dL (ref 65–99)
POTASSIUM: 3.2 mmol/L — AB (ref 3.5–5.1)
Sodium: 132 mmol/L — ABNORMAL LOW (ref 135–145)
TOTAL PROTEIN: 7.6 g/dL (ref 6.5–8.1)

## 2017-12-14 LAB — URINALYSIS, ROUTINE W REFLEX MICROSCOPIC
Bilirubin Urine: NEGATIVE
Glucose, UA: NEGATIVE mg/dL
Hgb urine dipstick: NEGATIVE
Ketones, ur: 80 mg/dL — AB
Leukocytes, UA: NEGATIVE
Nitrite: NEGATIVE
PROTEIN: 30 mg/dL — AB
Specific Gravity, Urine: 1.028 (ref 1.005–1.030)
pH: 5 (ref 5.0–8.0)

## 2017-12-14 MED ORDER — FAMOTIDINE 20 MG PO TABS: 20.0000 mg | ORAL_TABLET | Freq: Two times a day (BID) | ORAL | 0 refills | Status: DC

## 2017-12-14 MED ORDER — M.V.I. ADULT IV INJ
Freq: Once | INTRAVENOUS | Status: AC
  Administered 2017-12-14: 18:00:00 via INTRAVENOUS
  Filled 2017-12-14: qty 10

## 2017-12-14 MED ORDER — CEPHALEXIN 500 MG PO CAPS: 500.0000 mg | ORAL_CAPSULE | Freq: Four times a day (QID) | ORAL | 0 refills | Status: AC

## 2017-12-14 MED ORDER — METOCLOPRAMIDE HCL 10 MG PO TABS: 10.0000 mg | ORAL_TABLET | Freq: Four times a day (QID) | ORAL | 0 refills | Status: DC

## 2017-12-14 MED ORDER — LACTATED RINGERS IV SOLN
INTRAVENOUS | Status: DC
  Administered 2017-12-14: 19:00:00 via INTRAVENOUS

## 2017-12-14 MED ORDER — METOCLOPRAMIDE HCL 10 MG PO TABS
10.0000 mg | ORAL_TABLET | Freq: Once | ORAL | Status: AC
  Administered 2017-12-14: 10 mg via ORAL
  Filled 2017-12-14: qty 1

## 2017-12-14 MED ORDER — M.V.I. ADULT IV INJ: Freq: Once | INTRAVENOUS | Status: DC

## 2017-12-14 NOTE — MAU Note (Signed)
Latoya Mora is a 22 y.o. at [redacted]w[redacted]d here in MAU reporting:  Patient reports a 14-15lb weight loss this week Reports that she feels she is dehydrated +N/V/D since sunday Pain score: none States she called the on call line and was told to come in for evaluation Vitals:   12/14/17 1626  BP: 133/84  Pulse: (!) 108  Resp: 19  Temp: 98 F (36.7 C)  SpO2: 98%     FHT: difficult due to panus; will attempt once in room Lab orders placed from triage: ua

## 2017-12-14 NOTE — Discharge Instructions (Signed)

## 2017-12-14 NOTE — MAU Provider Note (Signed)
History     CSN: [id number]  Arrival date and time: 12/14/17 1617   First Provider Initiated Contact with Patient 12/14/17 1642      Chief Complaint  Patient presents with  . weight loss  . Dehydration  . Nausea  . Emesis  . Diarrhea   HPI  Latoya Mora is a 22 y.o. G1P0000 at [redacted]w[redacted]d who presents to MAU with chief complaint of nausea and vomiting. Reports she weighs herself daily on her home scale and her weight has dropped from 328 to 312lbs this week due to vomiting. Reports she called the nurse help line and was told to come to MAU for further evaluation.  Currently taking Bonjesta for nausea. States it has "really helped" until this week. States she was advised not to combine other medications with Lyla Son so she has not tried anything else for nausea/vomiting. Estimates she has vomited once today. Unable to determine how many episodes of vomiting she has experienced this week.  OB History    Gravida  1   Para  0   Term  0   Preterm  0   AB  0   Living  0     SAB  0   TAB  0   Ectopic  0   Multiple  0   Live Births  0           Past Medical History:  Diagnosis Date  . Allergy   . Anxiety   . Depression   . Frequent headaches   . PCOS (polycystic ovarian syndrome)     Past Surgical History:  Procedure Laterality Date  . TOOTH EXTRACTION      Family History  Problem Relation Age of Onset  . Alcohol abuse Mother   . Drug abuse Mother   . Depression Mother   . Anxiety disorder Mother   . Heart disease Mother   . Skin cancer Mother   . Scoliosis Mother   . Alcohol abuse Father   . Bipolar disorder Father   . Depression Father   . Osteochondroma Father   . COPD Maternal Grandmother   . Scoliosis Maternal Grandmother   . COPD Maternal Grandfather   . Skin cancer Maternal Grandfather   . Arthritis Paternal Grandmother   . Bipolar disorder Paternal Grandfather   . Osteochondroma Sister   . Osteochondroma Brother   . Cancer Neg Hx      Social History   Tobacco Use  . Smoking status: Former Research scientist (life sciences)  . Smokeless tobacco: Never Used  . Tobacco comment: quit 11/12/2010   Substance Use Topics  . Alcohol use: Not Currently  . Drug use: No    Allergies:  Allergies  Allergen Reactions  . Etodolac Nausea Only  . Meloxicam Other (See Comments)    Abdominal pain  . Flexeril [Cyclobenzaprine] Nausea Only  . Nsaids Other (See Comments)    Stomach pain  . Prozac [Fluoxetine Hcl]     Felt bad/awful    Medications Prior to Admission  Medication Sig Dispense Refill Last Dose  . Doxylamine-Pyridoxine ER (BONJESTA) 20-20 MG TBCR Take 1 tablet by mouth at bedtime. Can add 1 tablet in the morning if needed for nausea and vomiting 60 tablet 8 12/10/2017 at 1800  . Prenatal Vit-Fe Fumarate-FA (PRENATAL MULTIVITAMIN) TABS tablet Take 1 tablet by mouth daily at 12 noon.   12/10/2017 at 0800  . venlafaxine (EFFEXOR) 75 MG tablet Take 1 tablet (75 mg total) by mouth daily. X 7 days,  then 1/2 tablet (37.5mg  total) daily x 7 days 14 tablet 0 More than a month at Unknown time    Review of Systems  Constitutional: Positive for fatigue.  Gastrointestinal: Positive for nausea and vomiting. Negative for abdominal pain and diarrhea.  Endocrine: Negative for polydipsia, polyphagia and polyuria.  Genitourinary: Negative for difficulty urinating, dyspareunia, vaginal bleeding, vaginal discharge and vaginal pain.  Neurological: Negative for dizziness, weakness, light-headedness and headaches.   Physical Exam   Blood pressure 133/84, pulse (!) 108, temperature 98 F (36.7 C), temperature source Oral, resp. rate 19, weight (!) 319 lb 0.6 oz (144.7 kg), last menstrual period 09/10/2017, SpO2 98 %.  Physical Exam  Nursing note and vitals reviewed. Constitutional: She is oriented to person, place, and time. She appears well-developed and well-nourished.  Cardiovascular: Normal rate, regular rhythm and intact distal pulses.  Respiratory:  Effort normal and breath sounds normal.  GI: Soft. Bowel sounds are normal.  Neurological: She is alert and oriented to person, place, and time. She has normal reflexes.  Skin: Skin is warm and dry.  Psychiatric: She has a normal mood and affect. Her behavior is normal. Judgment and thought content normal.    MAU Course  Procedures  MDM Orders Placed This Encounter  Procedures  . Culture, OB Urine    Standing Status:   Standing    Number of Occurrences:   1  . Urinalysis, Routine w reflex microscopic    Standing Status:   Standing    Number of Occurrences:   1  . CBC    Standing Status:   Standing    Number of Occurrences:   1  . Comprehensive metabolic panel    Standing Status:   Standing    Number of Occurrences:   1  . Maintain IV access    Standing Status:   Standing    Number of Occurrences:   1  . Contact Isolation: Enteric    Standing Status:   Standing    Number of Occurrences:   1   Results for orders placed or performed during the hospital encounter of 12/14/17 (from the past 24 hour(s))  Urinalysis, Routine w reflex microscopic     Status: Abnormal   Collection Time: 12/14/17  4:32 PM  Result Value Ref Range   Color, Urine YELLOW YELLOW   APPearance CLEAR CLEAR   Specific Gravity, Urine 1.028 1.005 - 1.030   pH 5.0 5.0 - 8.0   Glucose, UA NEGATIVE NEGATIVE mg/dL   Hgb urine dipstick NEGATIVE NEGATIVE   Bilirubin Urine NEGATIVE NEGATIVE   Ketones, ur 80 (A) NEGATIVE mg/dL   Protein, ur 30 (A) NEGATIVE mg/dL   Nitrite NEGATIVE NEGATIVE   Leukocytes, UA NEGATIVE NEGATIVE   RBC / HPF 0-5 0 - 5 RBC/hpf   WBC, UA 0-5 0 - 5 WBC/hpf   Bacteria, UA MANY (A) NONE SEEN   Squamous Epithelial / LPF 6-10 0 - 5   Mucus PRESENT   CBC     Status: Abnormal   Collection Time: 12/14/17  5:22 PM  Result Value Ref Range   WBC 11.3 (H) 4.0 - 10.5 K/uL   RBC 4.62 3.87 - 5.11 MIL/uL   Hemoglobin 11.1 (L) 12.0 - 15.0 g/dL   HCT 35.0 (L) 36.0 - 46.0 %   MCV 75.8 (L) 78.0 -  100.0 fL   MCH 24.0 (L) 26.0 - 34.0 pg   MCHC 31.7 30.0 - 36.0 g/dL   RDW 15.7 (H) 11.5 - 15.5 %  Platelets 232 150 - 400 K/uL  Comprehensive metabolic panel     Status: Abnormal   Collection Time: 12/14/17  5:22 PM  Result Value Ref Range   Sodium 132 (L) 135 - 145 mmol/L   Potassium 3.2 (L) 3.5 - 5.1 mmol/L   Chloride 103 101 - 111 mmol/L   CO2 19 (L) 22 - 32 mmol/L   Glucose, Bld 87 65 - 99 mg/dL   BUN 9 6 - 20 mg/dL   Creatinine, Ser 0.46 0.44 - 1.00 mg/dL   Calcium 8.7 (L) 8.9 - 10.3 mg/dL   Total Protein 7.6 6.5 - 8.1 g/dL   Albumin 3.3 (L) 3.5 - 5.0 g/dL   AST 22 15 - 41 U/L   ALT 29 14 - 54 U/L   Alkaline Phosphatase 87 38 - 126 U/L   Total Bilirubin 0.4 0.3 - 1.2 mg/dL   GFR calc non Af Amer >60 >60 mL/min   GFR calc Af Amer >60 >60 mL/min   Anion gap 10 5 - 15     Assessment and Plan  --22 y.o. G1P0000 at [redacted]w[redacted]d --FHT 166 by Doppler --Dehydration in pregnancy --Asymptomatic bacteriuria in pregnancy --Keflex 500 mg PO QID x 7 days called to patient pharmacy --Satisfied with nausea relief provided by Reglan x 1 in MAU. Sending prescription for Reglan 10 mg PO q six hours for       nausea/vomiting. Consider as morning alternative to [facility] or use am and pm PRN instead of Bonjesta --OTC pepcid to assist with minimizing N/V --Infection and dehydration precautions reviewed with patient including but not limited to fever, unable to tolerate PO      intake x 24 hours, headache not relieved by Tylenol --Discharge home in stable condition  HPI, assessment and treatment course reviewed with Dr. Julien Girt, who agrees with my plan of care.  Darlina Rumpf, CNM 12/14/2017, 7:55 PM

## 2017-12-14 NOTE — MAU Note (Signed)
Urine in lab 

## 2017-12-16 LAB — CULTURE, OB URINE

## 2018-01-24 ENCOUNTER — Ambulatory Visit (HOSPITAL_COMMUNITY): Payer: BLUE CROSS/BLUE SHIELD

## 2018-01-24 ENCOUNTER — Other Ambulatory Visit (HOSPITAL_COMMUNITY): Payer: Self-pay | Admitting: Obstetrics and Gynecology

## 2018-01-24 DIAGNOSIS — N200 Calculus of kidney: Secondary | ICD-10-CM

## 2018-01-27 ENCOUNTER — Ambulatory Visit (HOSPITAL_COMMUNITY): Admission: RE | Admit: 2018-01-27 | Payer: BLUE CROSS/BLUE SHIELD | Source: Ambulatory Visit

## 2018-02-16 ENCOUNTER — Inpatient Hospital Stay (HOSPITAL_COMMUNITY)
Admission: AD | Admit: 2018-02-16 | Discharge: 2018-02-16 | Disposition: A | Discharge status: Home / Self Care | Payer: BLUE CROSS/BLUE SHIELD | Source: Ambulatory Visit | Attending: Obstetrics & Gynecology | Admitting: Obstetrics & Gynecology

## 2018-02-16 ENCOUNTER — Encounter (HOSPITAL_COMMUNITY): Payer: Self-pay

## 2018-02-16 DIAGNOSIS — O99342 Other mental disorders complicating pregnancy, second trimester: Secondary | ICD-10-CM | POA: Insufficient documentation

## 2018-02-16 DIAGNOSIS — F329 Major depressive disorder, single episode, unspecified: Secondary | ICD-10-CM | POA: Insufficient documentation

## 2018-02-16 DIAGNOSIS — Z87891 Personal history of nicotine dependence: Secondary | ICD-10-CM | POA: Insufficient documentation

## 2018-02-16 DIAGNOSIS — F419 Anxiety disorder, unspecified: Secondary | ICD-10-CM | POA: Diagnosis not present

## 2018-02-16 DIAGNOSIS — Z3A22 22 weeks gestation of pregnancy: Secondary | ICD-10-CM | POA: Insufficient documentation

## 2018-02-16 DIAGNOSIS — O2342 Unspecified infection of urinary tract in pregnancy, second trimester: Secondary | ICD-10-CM

## 2018-02-16 LAB — URINALYSIS, ROUTINE W REFLEX MICROSCOPIC
Bilirubin Urine: NEGATIVE
Glucose, UA: NEGATIVE mg/dL
Ketones, ur: NEGATIVE mg/dL
Nitrite: NEGATIVE
PROTEIN: 30 mg/dL — AB
RBC / HPF: >50 RBC/hpf — ABNORMAL HIGH (ref 0–5)
Specific Gravity, Urine: 1.006 (ref 1.005–1.030)
pH: 7 (ref 5.0–8.0)

## 2018-02-16 LAB — OB RESULTS CONSOLE GBS: GBS: NEGATIVE

## 2018-02-16 MED ORDER — PHENAZOPYRIDINE HCL 200 MG PO TABS: 200.0000 mg | ORAL_TABLET | Freq: Three times a day (TID) | ORAL | 0 refills | Status: DC | PRN

## 2018-02-16 MED ORDER — CEPHALEXIN 500 MG PO CAPS: 500.0000 mg | ORAL_CAPSULE | Freq: Four times a day (QID) | ORAL | 0 refills | Status: AC

## 2018-02-16 NOTE — Discharge Instructions (Signed)

## 2018-02-16 NOTE — MAU Note (Signed)
Pt is a G1P0 at 22.5 weeks c/o increased UTI symptoms including pain, dysuria, urgency, frequency and blood in urine.  Pt has h/o UTI in pregnancy.

## 2018-02-16 NOTE — MAU Provider Note (Addendum)
History     CSN: [id number]  Arrival date and time: 02/16/18 1013   First Provider Initiated Contact with Patient 02/16/18 1045      Chief Complaint  Patient presents with  . Urinary Tract Infection   G1 @22 .5 wks here with urinary sx. Urinary urgency, frequency, burning, and hematuria started early this am. Having to strain to start stream. No fevers. Some lower back pain x 2-3 weeks. Denies VB, LOF, or abd pain. +FM. Her pregnancy has been complicated by UTI x2.  OB History    Gravida  1   Para  0   Term  0   Preterm  0   AB  0   Living  0     SAB  0   TAB  0   Ectopic  0   Multiple  0   Live Births  0           Past Medical History:  Diagnosis Date  . Allergy   . Anxiety   . Depression   . Frequent headaches   . PCOS (polycystic ovarian syndrome)     Past Surgical History:  Procedure Laterality Date  . TOOTH EXTRACTION      Family History  Problem Relation Age of Onset  . Alcohol abuse Mother   . Drug abuse Mother   . Depression Mother   . Anxiety disorder Mother   . Heart disease Mother   . Skin cancer Mother   . Scoliosis Mother   . Alcohol abuse Father   . Bipolar disorder Father   . Depression Father   . Osteochondroma Father   . COPD Maternal Grandmother   . Scoliosis Maternal Grandmother   . COPD Maternal Grandfather   . Skin cancer Maternal Grandfather   . Arthritis Paternal Grandmother   . Bipolar disorder Paternal Grandfather   . Osteochondroma Sister   . Osteochondroma Brother   . Cancer Neg Hx     Social History   Tobacco Use  . Smoking status: Former Research scientist (life sciences)  . Smokeless tobacco: Never Used  . Tobacco comment: quit 11/12/2010   Substance Use Topics  . Alcohol use: Not Currently  . Drug use: No    Allergies:  Allergies  Allergen Reactions  . Etodolac Nausea Only  . Meloxicam Other (See Comments)    Abdominal pain  . Flexeril [Cyclobenzaprine] Nausea Only  . Nsaids Other (See Comments)    Stomach pain   . Prozac [Fluoxetine Hcl]     Felt bad/awful    No medications prior to admission.    Review of Systems  Constitutional: Negative for fever.  Gastrointestinal: Negative for abdominal pain.  Genitourinary: Positive for difficulty urinating, dysuria, hematuria and urgency. Negative for vaginal bleeding and vaginal discharge.  Musculoskeletal: Positive for back pain.   Physical Exam   Blood pressure 111/84, pulse (!) 108, temperature 98.1 F (36.7 C), weight (!) 147 kg, last menstrual period 09/10/2017.  Physical Exam  Constitutional: She is oriented to person, place, and time. She appears well-developed and well-nourished. No distress.  HENT:  Head: Normocephalic and atraumatic.  Cardiovascular: Normal rate.  Respiratory: Effort normal. No respiratory distress.  GI: Soft. She exhibits no distension and no mass. There is tenderness in the suprapubic area. There is no rebound, no guarding and no CVA tenderness.  Musculoskeletal: Normal range of motion.       Cervical back: Normal. She exhibits no tenderness.       Thoracic back: Normal. She exhibits no  tenderness.       Lumbar back: Normal. She exhibits no tenderness.  Neurological: She is alert and oriented to person, place, and time.  Skin: Skin is warm and dry.  Psychiatric: She has a normal mood and affect.  FHT 140  Results for orders placed or performed during the hospital encounter of 02/16/18 (from the past 24 hour(s))  Urinalysis, Routine w reflex microscopic     Status: Abnormal   Collection Time: 02/16/18 10:25 AM  Result Value Ref Range   Color, Urine YELLOW YELLOW   APPearance HAZY (A) CLEAR   Specific Gravity, Urine 1.006 1.005 - 1.030   pH 7.0 5.0 - 8.0   Glucose, UA NEGATIVE NEGATIVE mg/dL   Hgb urine dipstick LARGE (A) NEGATIVE   Bilirubin Urine NEGATIVE NEGATIVE   Ketones, ur NEGATIVE NEGATIVE mg/dL   Protein, ur 30 (A) NEGATIVE mg/dL   Nitrite NEGATIVE NEGATIVE   Leukocytes, UA LARGE (A) NEGATIVE    RBC / HPF >50 (H) 0 - 5 RBC/hpf   WBC, UA >50 (H) 0 - 5 WBC/hpf   Bacteria, UA RARE (A) NONE SEEN   Squamous Epithelial / LPF 0-5 0 - 5   Mucus PRESENT    MAU Course  Procedures  MDM Labs ordered and reviewed. UA w/Hgb and WBCs, will treat for UTI and send UC. No evidence pyelo. Discussed with pt she will need suppression remainder of pregnancy. Presentation, clinical findings, and plan discussed with Dr. Lynnette Caffey. Stable for discharge home.  Assessment and Plan   1. [redacted] weeks gestation of pregnancy   2. Urinary tract infection in mother during second trimester of pregnancy    Discharge home Follow up at [facility] office as scheduled Hydrate> 6 bottles water per day, and cranberry juice Rx Keflex Rx Pyridium  Allergies as of 02/16/2018      Reactions   Etodolac Nausea Only   Meloxicam Other (See Comments)   Abdominal pain   Flexeril [cyclobenzaprine] Nausea Only   Nsaids Other (See Comments)   Stomach pain   Prozac [fluoxetine Hcl]    Felt bad/awful      Medication List    TAKE these medications   cephALEXin 500 MG capsule Commonly known as:  KEFLEX Take 1 capsule (500 mg total) by mouth 4 (four) times daily for 7 days.   Doxylamine-Pyridoxine ER 20-20 MG Tbcr Take 1 tablet by mouth at bedtime. Can add 1 tablet in the morning if needed for nausea and vomiting   famotidine 20 MG tablet Commonly known as:  PEPCID Take 1 tablet (20 mg total) by mouth 2 (two) times daily.   metoCLOPramide 10 MG tablet Commonly known as:  REGLAN Take 1 tablet (10 mg total) by mouth every 6 (six) hours.   phenazopyridine 200 MG tablet Commonly known as:  PYRIDIUM Take 1 tablet (200 mg total) by mouth 3 (three) times daily as needed for pain.   prenatal multivitamin Tabs tablet Take 1 tablet by mouth daily at 12 noon.   venlafaxine 75 MG tablet Commonly known as:  EFFEXOR Take 1 tablet (75 mg total) by mouth daily. X 7 days, then 1/2 tablet (37.5mg  total) daily x 7 days      Julianne Handler, CNM 02/16/2018, 11:48 AM

## 2018-02-16 NOTE — MAU Note (Signed)
Urine sent to lab 

## 2018-02-18 LAB — CULTURE, OB URINE: Culture: >=100000 — AB

## 2018-03-10 ENCOUNTER — Other Ambulatory Visit (HOSPITAL_COMMUNITY): Payer: Self-pay | Admitting: Obstetrics and Gynecology

## 2018-03-10 DIAGNOSIS — N200 Calculus of kidney: Secondary | ICD-10-CM

## 2018-03-14 ENCOUNTER — Ambulatory Visit (HOSPITAL_COMMUNITY)
Admission: RE | Admit: 2018-03-14 | Discharge: 2018-03-14 | Disposition: A | Discharge status: Home / Self Care | Payer: BLUE CROSS/BLUE SHIELD | Source: Ambulatory Visit | Attending: Obstetrics and Gynecology | Admitting: Obstetrics and Gynecology

## 2018-03-14 DIAGNOSIS — R3989 Other symptoms and signs involving the genitourinary system: Secondary | ICD-10-CM | POA: Insufficient documentation

## 2018-03-14 DIAGNOSIS — M549 Dorsalgia, unspecified: Secondary | ICD-10-CM | POA: Insufficient documentation

## 2018-03-14 DIAGNOSIS — O26892 Other specified pregnancy related conditions, second trimester: Secondary | ICD-10-CM | POA: Diagnosis not present

## 2018-03-14 DIAGNOSIS — O26899 Other specified pregnancy related conditions, unspecified trimester: Secondary | ICD-10-CM | POA: Diagnosis present

## 2018-03-14 DIAGNOSIS — N200 Calculus of kidney: Secondary | ICD-10-CM

## 2018-03-14 DIAGNOSIS — Z3A25 25 weeks gestation of pregnancy: Secondary | ICD-10-CM | POA: Insufficient documentation

## 2018-04-16 ENCOUNTER — Inpatient Hospital Stay (HOSPITAL_COMMUNITY)
Admission: AD | Admit: 2018-04-16 | Discharge: 2018-04-16 | Disposition: A | Discharge status: Home / Self Care | Payer: BLUE CROSS/BLUE SHIELD | Source: Ambulatory Visit | Attending: Obstetrics and Gynecology | Admitting: Obstetrics and Gynecology

## 2018-04-16 ENCOUNTER — Other Ambulatory Visit: Payer: Self-pay

## 2018-04-16 ENCOUNTER — Encounter (HOSPITAL_COMMUNITY): Payer: Self-pay

## 2018-04-16 DIAGNOSIS — F419 Anxiety disorder, unspecified: Secondary | ICD-10-CM | POA: Insufficient documentation

## 2018-04-16 DIAGNOSIS — E282 Polycystic ovarian syndrome: Secondary | ICD-10-CM | POA: Insufficient documentation

## 2018-04-16 DIAGNOSIS — Z87891 Personal history of nicotine dependence: Secondary | ICD-10-CM | POA: Diagnosis not present

## 2018-04-16 DIAGNOSIS — O99343 Other mental disorders complicating pregnancy, third trimester: Secondary | ICD-10-CM | POA: Diagnosis not present

## 2018-04-16 DIAGNOSIS — F329 Major depressive disorder, single episode, unspecified: Secondary | ICD-10-CM | POA: Insufficient documentation

## 2018-04-16 DIAGNOSIS — O10913 Unspecified pre-existing hypertension complicating pregnancy, third trimester: Secondary | ICD-10-CM | POA: Diagnosis not present

## 2018-04-16 DIAGNOSIS — R102 Pelvic and perineal pain unspecified side: Secondary | ICD-10-CM

## 2018-04-16 DIAGNOSIS — O99213 Obesity complicating pregnancy, third trimester: Secondary | ICD-10-CM | POA: Insufficient documentation

## 2018-04-16 DIAGNOSIS — Z79899 Other long term (current) drug therapy: Secondary | ICD-10-CM | POA: Insufficient documentation

## 2018-04-16 DIAGNOSIS — Z3A30 30 weeks gestation of pregnancy: Secondary | ICD-10-CM

## 2018-04-16 DIAGNOSIS — R300 Dysuria: Secondary | ICD-10-CM | POA: Diagnosis present

## 2018-04-16 DIAGNOSIS — Z3A31 31 weeks gestation of pregnancy: Secondary | ICD-10-CM | POA: Insufficient documentation

## 2018-04-16 DIAGNOSIS — O99283 Endocrine, nutritional and metabolic diseases complicating pregnancy, third trimester: Secondary | ICD-10-CM | POA: Insufficient documentation

## 2018-04-16 DIAGNOSIS — O26893 Other specified pregnancy related conditions, third trimester: Secondary | ICD-10-CM

## 2018-04-16 DIAGNOSIS — O26899 Other specified pregnancy related conditions, unspecified trimester: Secondary | ICD-10-CM

## 2018-04-16 DIAGNOSIS — E66813 Obesity, class 3: Secondary | ICD-10-CM

## 2018-04-16 HISTORY — DX: Essential (primary) hypertension: I10

## 2018-04-16 LAB — URINALYSIS, ROUTINE W REFLEX MICROSCOPIC
Bilirubin Urine: NEGATIVE
Glucose, UA: NEGATIVE mg/dL
Hgb urine dipstick: NEGATIVE
KETONES UR: NEGATIVE mg/dL
LEUKOCYTES UA: NEGATIVE
NITRITE: NEGATIVE
PROTEIN: NEGATIVE mg/dL
Specific Gravity, Urine: 1.01 (ref 1.005–1.030)
pH: 7 (ref 5.0–8.0)

## 2018-04-16 LAB — CBC WITH DIFFERENTIAL/PLATELET
BASOS ABS: 0 10*3/uL (ref 0.0–0.1)
BASOS PCT: 0 %
EOS ABS: 0.1 10*3/uL (ref 0.0–0.5)
Eosinophils Relative: 1 %
HCT: 33.2 % — ABNORMAL LOW (ref 36.0–46.0)
HEMOGLOBIN: 10.3 g/dL — AB (ref 12.0–15.0)
Lymphocytes Relative: 22 %
Lymphs Abs: 2.1 10*3/uL (ref 0.7–4.0)
MCH: 22.8 pg — ABNORMAL LOW (ref 26.0–34.0)
MCHC: 31 g/dL (ref 30.0–36.0)
MCV: 73.5 fL — ABNORMAL LOW (ref 80.0–100.0)
Monocytes Absolute: 0.3 10*3/uL (ref 0.1–1.0)
Monocytes Relative: 4 %
NEUTROS PCT: 73 %
NRBC: 0 % (ref 0.0–0.2)
Neutro Abs: 7.2 10*3/uL (ref 1.7–7.7)
PLATELETS: 239 10*3/uL (ref 150–400)
RBC: 4.52 MIL/uL (ref 3.87–5.11)
RDW: 16.8 % — ABNORMAL HIGH (ref 11.5–15.5)
WBC: 9.7 10*3/uL (ref 4.0–10.5)

## 2018-04-16 LAB — COMPREHENSIVE METABOLIC PANEL
ALBUMIN: 2.8 g/dL — AB (ref 3.5–5.0)
ALT: 31 U/L (ref 0–44)
ANION GAP: 9 (ref 5–15)
AST: 21 U/L (ref 15–41)
Alkaline Phosphatase: 195 U/L — ABNORMAL HIGH (ref 38–126)
BUN: <5 mg/dL — ABNORMAL LOW (ref 6–20)
CHLORIDE: 103 mmol/L (ref 98–111)
CO2: 20 mmol/L — ABNORMAL LOW (ref 22–32)
CREATININE: 0.41 mg/dL — AB (ref 0.44–1.00)
Calcium: 8.6 mg/dL — ABNORMAL LOW (ref 8.9–10.3)
GFR calc Af Amer: >60 mL/min (ref >60)
GFR calc non Af Amer: >60 mL/min (ref >60)
Glucose, Bld: 126 mg/dL — ABNORMAL HIGH (ref 70–99)
Potassium: 3.9 mmol/L (ref 3.5–5.1)
SODIUM: 132 mmol/L — AB (ref 135–145)
Total Bilirubin: 0.3 mg/dL (ref 0.3–1.2)
Total Protein: 6.9 g/dL (ref 6.5–8.1)

## 2018-04-16 LAB — PROTEIN / CREATININE RATIO, URINE
Creatinine, Urine: 70 mg/dL
Protein Creatinine Ratio: 0.11 mg/mg{Cre} (ref 0.00–0.15)
TOTAL PROTEIN, URINE: 8 mg/dL

## 2018-04-16 MED ORDER — COMFORT FIT MATERNITY SUPP LG MISC: 1.0000 [IU] | Freq: Every day | 0 refills | Status: DC | PRN

## 2018-04-16 MED ORDER — CITRANATAL BLOOM DHA 90-1 & 300 MG PO MISC: 1.0000 | Freq: Every day | ORAL | 11 refills | Status: DC

## 2018-04-16 MED ORDER — LABETALOL HCL 100 MG PO TABS: 100.0000 mg | ORAL_TABLET | Freq: Two times a day (BID) | ORAL | 1 refills | Status: DC

## 2018-04-16 NOTE — Discharge Instructions (Signed)

## 2018-04-16 NOTE — MAU Note (Signed)
Pt presents to MAU with complaints of pain in her lower abdomen and it radiates to her back. History or UTIs with the pregnancy and has taken macrobid,  Denies any VB or LOF

## 2018-04-16 NOTE — MAU Provider Note (Signed)
History     CSN: [id number]  Arrival date and time: 04/16/18 1213   First Provider Initiated Contact with Patient 04/16/18 1250      Chief Complaint  Patient presents with  . Pelvic Pain  . Dysuria   Latoya Mora is a 22 y.o. G1P0000 at [redacted]w[redacted]d who presents today with lower abdominal pain. On arrival she was found to have elevated blood pressure.   On review of the chart: 142/92 @ 7 weeks  153/8? At 12 weeks  140/90 at 24 weeks, Per prenatal records patient saw MFM and was told CHTN. No meds at this time. Was started on baby ASA. Next visit in the office 10/28, in the process of changing [facility] has an appointment there on 10/31. Patient saw MFM on 03/10/18:  RECOMMENDATIONS: Given chronic hypertension, recommend serial ultrasounds for growth and antenatal testing to begin at 32 weeks.  Pelvic Pain  The patient's primary symptoms include pelvic pain. This is a new problem. The current episode started yesterday. The problem occurs intermittently. The problem has been unchanged. Pain severity now: 8/10. The problem affects both sides. She is pregnant. The symptoms are aggravated by activity. She has tried nothing for the symptoms.    OB History    Gravida  1   Para  0   Term  0   Preterm  0   AB  0   Living  0     SAB  0   TAB  0   Ectopic  0   Multiple  0   Live Births  0           Past Medical History:  Diagnosis Date  . Allergy   . Anxiety   . Depression   . Frequent headaches   . Hypertension   . PCOS (polycystic ovarian syndrome)     Past Surgical History:  Procedure Laterality Date  . TOOTH EXTRACTION      Family History  Problem Relation Age of Onset  . Alcohol abuse Mother   . Drug abuse Mother   . Depression Mother   . Anxiety disorder Mother   . Heart disease Mother   . Skin cancer Mother   . Scoliosis Mother   . Alcohol abuse Father   . Bipolar disorder Father   . Depression Father   . Osteochondroma Father   . COPD  Maternal Grandmother   . Scoliosis Maternal Grandmother   . COPD Maternal Grandfather   . Skin cancer Maternal Grandfather   . Arthritis Paternal Grandmother   . Bipolar disorder Paternal Grandfather   . Osteochondroma Sister   . Osteochondroma Brother   . Cancer Neg Hx     Social History   Tobacco Use  . Smoking status: Former Research scientist (life sciences)  . Smokeless tobacco: Never Used  . Tobacco comment: quit 11/12/2010   Substance Use Topics  . Alcohol use: Not Currently  . Drug use: No    Allergies:  Allergies  Allergen Reactions  . Etodolac Nausea Only  . Meloxicam Other (See Comments)    Abdominal pain  . Flexeril [Cyclobenzaprine] Nausea Only  . Nsaids Other (See Comments)    Stomach pain  . Prozac [Fluoxetine Hcl]     Felt bad/awful    Medications Prior to Admission  Medication Sig Dispense Refill Last Dose  . Doxylamine-Pyridoxine ER (BONJESTA) 20-20 MG TBCR Take 1 tablet by mouth at bedtime. Can add 1 tablet in the morning if needed for nausea and vomiting 60  tablet 8 12/10/2017 at 1800  . famotidine (PEPCID) 20 MG tablet Take 1 tablet (20 mg total) by mouth 2 (two) times daily. 30 tablet 0   . metoCLOPramide (REGLAN) 10 MG tablet Take 1 tablet (10 mg total) by mouth every 6 (six) hours. 30 tablet 0   . phenazopyridine (PYRIDIUM) 200 MG tablet Take 1 tablet (200 mg total) by mouth 3 (three) times daily as needed for pain. 10 tablet 0   . Prenatal Vit-Fe Fumarate-FA (PRENATAL MULTIVITAMIN) TABS tablet Take 1 tablet by mouth daily at 12 noon.   12/10/2017 at 0800  . venlafaxine (EFFEXOR) 75 MG tablet Take 1 tablet (75 mg total) by mouth daily. X 7 days, then 1/2 tablet (37.5mg  total) daily x 7 days 14 tablet 0 More than a month at Unknown time    Review of Systems  Genitourinary: Positive for pelvic pain.   Physical Exam   Blood pressure (!) 145/85, pulse (!) 124, temperature 98.1 F (36.7 C), resp. rate 18, height 5\' 8"  (1.727 m), weight (!) 146.1 kg, last menstrual period  09/10/2017, SpO2 100 %.  Physical Exam  Nursing note and vitals reviewed. Constitutional: She is oriented to person, place, and time. She appears well-developed and well-nourished. No distress.  HENT:  Head: Normocephalic.  Cardiovascular: Normal rate.  Respiratory: Effort normal.  GI: Soft. There is no tenderness. There is no rebound.  Genitourinary:  Genitourinary Comments: Cervix: closed/thick/posterior   Neurological: She is alert and oriented to person, place, and time.  Skin: Skin is warm and dry.  Psychiatric: She has a normal mood and affect.     NST:  Baseline: 140 Variability: moderate Accels: 15x15 Decels: none Toco: none Results for orders placed or performed during the hospital encounter of 04/16/18 (from the past 24 hour(s))  Protein / creatinine ratio, urine     Status: None   Collection Time: 04/16/18 12:36 PM  Result Value Ref Range   Creatinine, Urine 70.00 mg/dL   Total Protein, Urine 8 mg/dL   Protein Creatinine Ratio 0.11 0.00 - 0.15 mg/mg[Cre]  Urinalysis, Routine w reflex microscopic     Status: None   Collection Time: 04/16/18 12:39 PM  Result Value Ref Range   Color, Urine YELLOW YELLOW   APPearance CLEAR CLEAR   Specific Gravity, Urine 1.010 1.005 - 1.030   pH 7.0 5.0 - 8.0   Glucose, UA NEGATIVE NEGATIVE mg/dL   Hgb urine dipstick NEGATIVE NEGATIVE   Bilirubin Urine NEGATIVE NEGATIVE   Ketones, ur NEGATIVE NEGATIVE mg/dL   Protein, ur NEGATIVE NEGATIVE mg/dL   Nitrite NEGATIVE NEGATIVE   Leukocytes, UA NEGATIVE NEGATIVE  CBC with Differential/Platelet     Status: Abnormal   Collection Time: 04/16/18  1:05 PM  Result Value Ref Range   WBC 9.7 4.0 - 10.5 K/uL   RBC 4.52 3.87 - 5.11 MIL/uL   Hemoglobin 10.3 (L) 12.0 - 15.0 g/dL   HCT 33.2 (L) 36.0 - 46.0 %   MCV 73.5 (L) 80.0 - 100.0 fL   MCH 22.8 (L) 26.0 - 34.0 pg   MCHC 31.0 30.0 - 36.0 g/dL   RDW 16.8 (H) 11.5 - 15.5 %   Platelets 239 150 - 400 K/uL   nRBC 0.0 0.0 - 0.2 %    Neutrophils Relative % 73 %   Neutro Abs 7.2 1.7 - 7.7 K/uL   Lymphocytes Relative 22 %   Lymphs Abs 2.1 0.7 - 4.0 K/uL   Monocytes Relative 4 %   Monocytes Absolute  0.3 0.1 - 1.0 K/uL   Eosinophils Relative 1 %   Eosinophils Absolute 0.1 0.0 - 0.5 K/uL   Basophils Relative 0 %   Basophils Absolute 0.0 0.0 - 0.1 K/uL  Comprehensive metabolic panel     Status: Abnormal   Collection Time: 04/16/18  1:05 PM  Result Value Ref Range   Sodium 132 (L) 135 - 145 mmol/L   Potassium 3.9 3.5 - 5.1 mmol/L   Chloride 103 98 - 111 mmol/L   CO2 20 (L) 22 - 32 mmol/L   Glucose, Bld 126 (H) 70 - 99 mg/dL   BUN <5 (L) 6 - 20 mg/dL   Creatinine, Ser 0.41 (L) 0.44 - 1.00 mg/dL   Calcium 8.6 (L) 8.9 - 10.3 mg/dL   Total Protein 6.9 6.5 - 8.1 g/dL   Albumin 2.8 (L) 3.5 - 5.0 g/dL   AST 21 15 - 41 U/L   ALT 31 0 - 44 U/L   Alkaline Phosphatase 195 (H) 38 - 126 U/L   Total Bilirubin 0.3 0.3 - 1.2 mg/dL   GFR calc non Af Amer >60 >60 mL/min   GFR calc Af Amer >60 >60 mL/min   Anion gap 9 5 - 15    MAU Course  Procedures  MDM 2:23 PM Consult with Dr. Rip Harbour, will start labetalol 100mg  BID and put in order for MFM growth [place]. Patient has appt with [place] office on 10/31.   Assessment and Plan   1. Pain of round ligament affecting pregnancy, antepartum   2. Chronic hypertension with exacerbation during pregnancy in third trimester   3. [redacted] weeks gestation of pregnancy   4. Obesity, Class III, BMI 40-49.9 (morbid obesity) ([place])    DC home Comfort measures reviewed Preeclampsia warning signs reviewed   3rd Trimester precautions  PTL precautions  Fetal kick counts RX: citranatal bloom QD, Labetalol 100mg  BID, pregnancy support belt, MFM [place] follow up ordered  Return to MAU as needed FU with OB as planned  [place] for [place] [facility] Follow up.   Specialty:  Obstetrics and Gynecology Contact information: [place]  [facility] Follow up.   Specialty:  Maternal and Fetal Medicine Why:  They will call you with an ultrasound appointment  Contact information: [address] [id number] [place] 04/16/2018, 12:53 PM

## 2018-04-23 ENCOUNTER — Other Ambulatory Visit: Payer: Self-pay | Admitting: *Deleted

## 2018-04-23 DIAGNOSIS — Z3401 Encounter for supervision of normal first pregnancy, first trimester: Secondary | ICD-10-CM

## 2018-04-23 MED ORDER — PREPLUS 27-1 MG PO TABS: 1.0000 | ORAL_TABLET | Freq: Every day | ORAL | 6 refills | Status: DC

## 2018-04-24 ENCOUNTER — Ambulatory Visit (INDEPENDENT_AMBULATORY_CARE_PROVIDER_SITE_OTHER): Payer: BLUE CROSS/BLUE SHIELD | Admitting: Obstetrics & Gynecology

## 2018-04-24 VITALS — BP 140/85 | HR 121 | Wt 319.0 lb

## 2018-04-24 DIAGNOSIS — O10913 Unspecified pre-existing hypertension complicating pregnancy, third trimester: Secondary | ICD-10-CM

## 2018-04-24 DIAGNOSIS — O0993 Supervision of high risk pregnancy, unspecified, third trimester: Secondary | ICD-10-CM | POA: Diagnosis not present

## 2018-04-24 DIAGNOSIS — O10919 Unspecified pre-existing hypertension complicating pregnancy, unspecified trimester: Secondary | ICD-10-CM | POA: Insufficient documentation

## 2018-04-24 DIAGNOSIS — Z23 Encounter for immunization: Secondary | ICD-10-CM

## 2018-04-24 NOTE — Patient Instructions (Signed)
Hypertension During Pregnancy °Hypertension, commonly called high blood pressure, is when the force of blood pumping through your arteries is too strong. Arteries are blood vessels that carry blood from the heart throughout the body. Hypertension during pregnancy can cause problems for you and your baby. Your baby may be born early (prematurely) or may not weigh as much as he or she should at birth. Very bad cases of hypertension during pregnancy can be life-threatening. °Different types of hypertension can occur during pregnancy. These include: °· Chronic hypertension. This happens when: °? You have hypertension before pregnancy and it continues during pregnancy. °? You develop hypertension before you are [redacted] weeks pregnant, and it continues during pregnancy. °· Gestational hypertension. This is hypertension that develops after the 20th week of pregnancy. °· Preeclampsia, also called toxemia of pregnancy. This is a very serious type of hypertension that develops only during pregnancy. It affects the whole body, and it can be very dangerous for you and your baby. ° °Gestational hypertension and preeclampsia usually go away within 6 weeks after your baby is born. Women who have hypertension during pregnancy have a greater chance of developing hypertension later in life or during future pregnancies. °What are the causes? °The exact cause of hypertension is not known. °What increases the risk? °There are certain factors that make it more likely for you to develop hypertension during pregnancy. These include: °· Having hypertension during a previous pregnancy or prior to pregnancy. °· Being overweight. °· Being older than age 40. °· Being pregnant for the first time or being pregnant with more than one baby. °· Becoming pregnant using fertilization methods such as IVF (in vitro fertilization). °· Having diabetes, kidney problems, or systemic lupus erythematosus. °· Having a family history of hypertension. ° °What are the  signs or symptoms? °Chronic hypertension and gestational hypertension rarely cause symptoms. Preeclampsia causes symptoms, which may include: °· Increased protein in your urine. Your health care provider will check for this at every visit before you give birth (prenatal visit). °· Severe headaches. °· Sudden weight gain. °· Swelling of the hands, face, legs, and feet. °· Nausea and vomiting. °· Vision problems, such as blurred or double vision. °· Numbness in the face, arms, legs, and feet. °· Dizziness. °· Slurred speech. °· Sensitivity to bright lights. °· Abdominal pain. °· Convulsions. ° °How is this diagnosed? °You may be diagnosed with hypertension during a routine prenatal exam. At each prenatal visit, you may: °· Have a urine test to check for high amounts of protein in your urine. °· Have your blood pressure checked. A blood pressure reading is recorded as two numbers, such as "120 over 80" (or 120/80). The first ("top") number is called the systolic pressure. It is a measure of the pressure in your arteries when your heart beats. The second ("bottom") number is called the diastolic pressure. It is a measure of the pressure in your arteries as your heart relaxes between beats. Blood pressure is measured in a unit called mm Hg. A normal blood pressure reading is: °? Systolic: below 120. °? Diastolic: below 80. ° °The type of hypertension that you are diagnosed with depends on your test results and when your symptoms developed. °· Chronic hypertension is usually diagnosed before 20 weeks of pregnancy. °· Gestational hypertension is usually diagnosed after 20 weeks of pregnancy. °· Hypertension with high amounts of protein in the urine is diagnosed as preeclampsia. °· Blood pressure measurements that stay above 160 systolic, or above 110 diastolic, are   signs of severe preeclampsia. ° °How is this treated? °Treatment for hypertension during pregnancy varies depending on the type of hypertension you have and how  serious it is. °· If you take medicines called ACE inhibitors to treat chronic hypertension, you may need to switch medicines. ACE inhibitors should not be taken during pregnancy. °· If you have gestational hypertension, you may need to take blood pressure medicine. °· If you are at risk for preeclampsia, your health care provider may recommend that you take a low-dose aspirin every day to prevent high blood pressure during your pregnancy. °· If you have severe preeclampsia, you may need to be hospitalized so you and your baby can be monitored closely. You may also need to take medicine (magnesium sulfate) to prevent seizures and to lower blood pressure. This medicine may be given as an injection or through an IV tube. °· In some cases, if your condition gets worse, you may need to deliver your baby early. ° °Follow these instructions at home: °Eating and drinking °· Drink enough fluid to keep your urine clear or pale yellow. °· Eat a healthy diet that is low in salt (sodium). Do not add salt to your food. Check food labels to see how much sodium a food or beverage contains. °Lifestyle °· Do not use any products that contain nicotine or tobacco, such as cigarettes and e-cigarettes. If you need help quitting, ask your health care provider. °· Do not use alcohol. °· Avoid caffeine. °· Avoid stress as much as possible. Rest and get plenty of sleep. °General instructions °· Take over-the-counter and prescription medicines only as told by your health care provider. °· While lying down, lie on your left side. This keeps pressure off your baby. °· While sitting or lying down, raise (elevate) your feet. Try putting some pillows under your lower legs. °· Exercise regularly. Ask your health care provider what kinds of exercise are best for you. °· Keep all prenatal and follow-up visits as told by your health care provider. This is important. °Contact a health care provider if: °· You have symptoms that your health care  provider told you may require more treatment or monitoring, such as: °? Fever. °? Vomiting. °? Headache. °Get help right away if: °· You have severe abdominal pain or vomiting that does not get better with treatment. °· You suddenly develop swelling in your hands, ankles, or face. °· You gain 4 lbs (1.8 kg) or more in 1 week. °· You develop vaginal bleeding, or you have blood in your urine. °· You do not feel your baby moving as much as usual. °· You have blurred or double vision. °· You have muscle twitching or sudden tightening (spasms). °· You have shortness of breath. °· Your lips or fingernails turn blue. °This information is not intended to replace advice given to you by your health care provider. Make sure you discuss any questions you have with your health care provider. °Document Released: 02/27/2011 Document Revised: 12/30/2015 Document Reviewed: 11/25/2015 °Elsevier Interactive Patient Education © 2018 [employer] ° °

## 2018-04-24 NOTE — Progress Notes (Signed)
   PRENATAL VISIT NOTE  Subjective:  Latoya Mora is a 22 y.o. G1P0000 at [redacted]w[redacted]d being seen today for transfer of prenatal care. Initial visit at [facility] at [redacted]w[redacted]d>>>[facility] until 03/10/2018 at [redacted]w[redacted]d >>[place] at [redacted]w[redacted]d due to insurance issues. Diagnosed with CHTN, has been seen in MAU a few times, labs on 04/16/18 were normal.  She is currently monitored for the following issues for this high-risk pregnancy and has Seasonal allergies; Anxiety and depression; Frequent headaches; Obesity, Class III, BMI 40-49.9 (morbid obesity) ([place]); PCOS (polycystic ovarian syndrome); Supervision of high-risk pregnancy; Ovarian cyst; and Chronic hypertension complicating pregnancy, antepartum on their problem list.  Patient reports no complaints.  Contractions: Not present. Vag. Bleeding: None.  Movement: Present. Denies leaking of fluid.  Patient denies any headaches, visual symptoms, RUQ/epigastric pain.   The following portions of the patient's history were reviewed and updated as appropriate: allergies, current medications, past family history, past medical history, past social history, past surgical history and problem list. Problem list updated.  Objective:   Vitals:   04/24/18 1445 04/24/18 1506  BP: (!) 145/107 140/85  Pulse: (!) 121   Weight: (!) 319 lb (144.7 kg)     Fetal Status: Fetal Heart Rate (bpm): 151   Movement: Present     General:  Alert, oriented and cooperative. Patient is in no acute distress.  Skin: Skin is warm and dry. No rash noted.   Cardiovascular: Normal heart rate noted  Respiratory: Normal respiratory effort, no problems with respiration noted  Abdomen: Soft, gravid, appropriate for gestational age.  Pain/Pressure: Absent     Pelvic: Cervical exam deferred        Extremities: Normal range of motion.  Edema: Trace  Mental Status: Normal mood and affect. Normal behavior. Normal judgment and thought content.   Assessment and Plan:  Pregnancy: G1P0000 at  [redacted]w[redacted]d  1. Chronic hypertension complicating pregnancy, antepartum On Labetalol and ASA, will continue. Antenatal testing started today, BPP to be scheduled weekly. Growth scan to be scheduled next week. NST performed today was reviewed and was found to be reactive.  Preeclampsia precautions reviewed. - Fetal nonstress test - [place] MFM OB DETAIL +14 WK; Future - [place] MFM FETAL BPP WO NON STRESS; Future - [place] MFM FETAL BPP WO NON STRESS; Future  2. Supervision of high risk pregnancy in third trimester The nature of [place] with multiple MDs and other Advanced Practice Providers was explained to patient; also emphasized that residents, students are part of our team. Awaiting records from [facility].  Preterm labor symptoms and general obstetric precautions including but not limited to vaginal bleeding, contractions, leaking of fluid and fetal movement were reviewed in detail with the patient. Please refer to After Visit Summary for other counseling recommendations.  Return in about 1 week (around 05/01/2018) for NST only. 2 weeks: OB visit and NST.   Verita Schneiders, MD

## 2018-04-28 ENCOUNTER — Telehealth: Payer: Self-pay | Admitting: *Deleted

## 2018-04-28 NOTE — Telephone Encounter (Signed)
Pt called concerned about dehydration, states she can only drink about 6 ounces of water at a time and is having some  vomiting due to feeling so full or not having enough room. Pt states she is extremely thirsty and is able to keep down ice chips. Instructed patient to keep drinking frequent small amounts and/or ice chips, and if she feels that she is not keeping anything down, or feeling worse to go to MAU for evaluation.   Crosby Oyster, RN

## 2018-05-01 ENCOUNTER — Encounter (HOSPITAL_COMMUNITY): Payer: Self-pay

## 2018-05-01 ENCOUNTER — Ambulatory Visit (INDEPENDENT_AMBULATORY_CARE_PROVIDER_SITE_OTHER): Payer: BLUE CROSS/BLUE SHIELD | Admitting: *Deleted

## 2018-05-01 ENCOUNTER — Ambulatory Visit (HOSPITAL_COMMUNITY)
Admission: RE | Admit: 2018-05-01 | Discharge: 2018-05-01 | Disposition: A | Discharge status: Home / Self Care | Payer: BLUE CROSS/BLUE SHIELD | Source: Ambulatory Visit | Attending: Obstetrics & Gynecology | Admitting: Obstetrics & Gynecology

## 2018-05-01 VITALS — BP 135/89 | HR 108

## 2018-05-01 DIAGNOSIS — O99213 Obesity complicating pregnancy, third trimester: Secondary | ICD-10-CM

## 2018-05-01 DIAGNOSIS — O0993 Supervision of high risk pregnancy, unspecified, third trimester: Secondary | ICD-10-CM

## 2018-05-01 DIAGNOSIS — O10913 Unspecified pre-existing hypertension complicating pregnancy, third trimester: Secondary | ICD-10-CM

## 2018-05-01 DIAGNOSIS — Z3A33 33 weeks gestation of pregnancy: Secondary | ICD-10-CM | POA: Diagnosis not present

## 2018-05-01 DIAGNOSIS — Z363 Encounter for antenatal screening for malformations: Secondary | ICD-10-CM | POA: Diagnosis not present

## 2018-05-01 DIAGNOSIS — O10919 Unspecified pre-existing hypertension complicating pregnancy, unspecified trimester: Secondary | ICD-10-CM

## 2018-05-02 ENCOUNTER — Inpatient Hospital Stay (HOSPITAL_COMMUNITY)
Admission: AD | Admit: 2018-05-02 | Discharge: 2018-05-02 | Disposition: A | Discharge status: Home / Self Care | Payer: BLUE CROSS/BLUE SHIELD | Source: Ambulatory Visit | Attending: Obstetrics and Gynecology | Admitting: Obstetrics and Gynecology

## 2018-05-02 ENCOUNTER — Encounter (HOSPITAL_COMMUNITY): Payer: Self-pay

## 2018-05-02 ENCOUNTER — Other Ambulatory Visit (HOSPITAL_COMMUNITY): Payer: Self-pay | Admitting: *Deleted

## 2018-05-02 ENCOUNTER — Other Ambulatory Visit: Payer: Self-pay

## 2018-05-02 DIAGNOSIS — O10919 Unspecified pre-existing hypertension complicating pregnancy, unspecified trimester: Secondary | ICD-10-CM

## 2018-05-02 DIAGNOSIS — Z3A33 33 weeks gestation of pregnancy: Secondary | ICD-10-CM | POA: Insufficient documentation

## 2018-05-02 DIAGNOSIS — O10013 Pre-existing essential hypertension complicating pregnancy, third trimester: Secondary | ICD-10-CM | POA: Insufficient documentation

## 2018-05-02 DIAGNOSIS — O10913 Unspecified pre-existing hypertension complicating pregnancy, third trimester: Secondary | ICD-10-CM

## 2018-05-02 DIAGNOSIS — O26893 Other specified pregnancy related conditions, third trimester: Secondary | ICD-10-CM

## 2018-05-02 DIAGNOSIS — Z7982 Long term (current) use of aspirin: Secondary | ICD-10-CM | POA: Insufficient documentation

## 2018-05-02 DIAGNOSIS — R51 Headache: Secondary | ICD-10-CM | POA: Diagnosis present

## 2018-05-02 DIAGNOSIS — Z87891 Personal history of nicotine dependence: Secondary | ICD-10-CM | POA: Insufficient documentation

## 2018-05-02 LAB — CBC
HCT: 36.6 % (ref 36.0–46.0)
HEMOGLOBIN: 11 g/dL — AB (ref 12.0–15.0)
MCH: 22.4 pg — AB (ref 26.0–34.0)
MCHC: 30.1 g/dL (ref 30.0–36.0)
MCV: 74.5 fL — AB (ref 80.0–100.0)
Platelets: 277 10*3/uL (ref 150–400)
RBC: 4.91 MIL/uL (ref 3.87–5.11)
RDW: 17.3 % — ABNORMAL HIGH (ref 11.5–15.5)
WBC: 11.3 10*3/uL — ABNORMAL HIGH (ref 4.0–10.5)
nRBC: 0 % (ref 0.0–0.2)

## 2018-05-02 LAB — COMPREHENSIVE METABOLIC PANEL
ALK PHOS: 271 U/L — AB (ref 38–126)
ALT: 68 U/L — AB (ref 0–44)
ANION GAP: 9 (ref 5–15)
AST: 40 U/L (ref 15–41)
Albumin: 2.8 g/dL — ABNORMAL LOW (ref 3.5–5.0)
BUN: <5 mg/dL — ABNORMAL LOW (ref 6–20)
CALCIUM: 9 mg/dL (ref 8.9–10.3)
CO2: 19 mmol/L — ABNORMAL LOW (ref 22–32)
CREATININE: 0.35 mg/dL — AB (ref 0.44–1.00)
Chloride: 105 mmol/L (ref 98–111)
GFR calc non Af Amer: >60 mL/min (ref >60)
Glucose, Bld: 89 mg/dL (ref 70–99)
Potassium: 3.8 mmol/L (ref 3.5–5.1)
Sodium: 133 mmol/L — ABNORMAL LOW (ref 135–145)
Total Bilirubin: 0.4 mg/dL (ref 0.3–1.2)
Total Protein: 7.8 g/dL (ref 6.5–8.1)

## 2018-05-02 LAB — PROTEIN / CREATININE RATIO, URINE
CREATININE, URINE: 77 mg/dL
PROTEIN CREATININE RATIO: 0.16 mg/mg{creat} — AB (ref 0.00–0.15)
TOTAL PROTEIN, URINE: 12 mg/dL

## 2018-05-02 MED ORDER — BUTALBITAL-APAP-CAFFEINE 50-325-40 MG PO TABS
2.0000 | ORAL_TABLET | Freq: Once | ORAL | Status: AC
  Administered 2018-05-02: 2 via ORAL
  Filled 2018-05-02: qty 2

## 2018-05-02 NOTE — MAU Provider Note (Signed)
History     CSN: [id number]  Arrival date and time: 05/02/18 1509   First Provider Initiated Contact with Patient 05/02/18 1624     Chief Complaint  Patient presents with  . Dizziness  . Fatigue  . Headache   HPI Latoya Mora is a 22 y.o. G1P0000 at [redacted]w[redacted]d who presents with a headache. She states she noticed the headache this morning and rates the pain a 1/10. She did not take anything for the pain. She denies any visual changes or epigastric pain. She denies leaking or bleeding. Reports normal fetal movement. She has chronic hypertension on labetalol.  OB History    Gravida  1   Para  0   Term  0   Preterm  0   AB  0   Living  0     SAB  0   TAB  0   Ectopic  0   Multiple  0   Live Births  0           Past Medical History:  Diagnosis Date  . Allergy   . Anxiety   . Depression   . Frequent headaches   . Hypertension   . PCOS (polycystic ovarian syndrome)     Past Surgical History:  Procedure Laterality Date  . TOOTH EXTRACTION      Family History  Problem Relation Age of Onset  . Alcohol abuse Mother   . Drug abuse Mother   . Depression Mother   . Anxiety disorder Mother   . Heart disease Mother   . Skin cancer Mother   . Scoliosis Mother   . Alcohol abuse Father   . Bipolar disorder Father   . Depression Father   . Osteochondroma Father   . COPD Maternal Grandmother   . Scoliosis Maternal Grandmother   . COPD Maternal Grandfather   . Skin cancer Maternal Grandfather   . Arthritis Paternal Grandmother   . Bipolar disorder Paternal Grandfather   . Osteochondroma Sister   . Osteochondroma Brother   . Cancer Neg Hx     Social History   Tobacco Use  . Smoking status: Former Research scientist (life sciences)  . Smokeless tobacco: Never Used  . Tobacco comment: quit 11/12/2010   Substance Use Topics  . Alcohol use: Not Currently  . Drug use: No    Allergies:  Allergies  Allergen Reactions  . Etodolac Nausea Only  . Meloxicam Other (See Comments)   Abdominal pain  . Flexeril [Cyclobenzaprine] Nausea Only  . Nsaids Other (See Comments)    Stomach pain  . Prozac [Fluoxetine Hcl]     Felt bad/awful    Medications Prior to Admission  Medication Sig Dispense Refill Last Dose  . aspirin EC 81 MG tablet Take 81 mg by mouth daily.   Not Taking  . Elastic Bandages & Supports (COMFORT FIT MATERNITY SUPP LG) MISC 1 Units by Does not apply route daily as needed. 1 each 0   . labetalol (NORMODYNE) 100 MG tablet Take 1 tablet (100 mg total) by mouth 2 (two) times daily. 60 tablet 1 Taking  . nitrofurantoin, macrocrystal-monohydrate, (MACROBID) 100 MG capsule Take 100 mg by mouth at bedtime.   Taking  . Prenat w/o A-FeCbGl-DSS-FA-DHA (CITRANATAL BLOOM DHA) 90-1 & 300 MG MISC Take 1 tablet by mouth daily. (Patient not taking: Reported on 05/01/2018) 30 each 11 Not Taking  . Prenatal Vit-Fe Fumarate-FA (PREPLUS) 27-1 MG TABS Take 1 tablet by mouth daily. 30 tablet 6 Taking    Review  of Systems  Constitutional: Negative.  Negative for fatigue and fever.  HENT: Negative.   Eyes: Negative for visual disturbance.  Respiratory: Negative.  Negative for shortness of breath.   Cardiovascular: Negative.  Negative for chest pain.  Gastrointestinal: Negative.  Negative for abdominal pain, constipation, diarrhea, nausea and vomiting.  Genitourinary: Negative.  Negative for dysuria, vaginal bleeding and vaginal discharge.  Neurological: Positive for headaches. Negative for dizziness.   Physical Exam   Blood pressure 133/89, pulse (!) 112, temperature 98.4 F (36.9 C), temperature source Oral, resp. rate 18, weight (!) 146.2 kg, last menstrual period 09/10/2017, SpO2 100 %.  Patient Vitals for the past 24 hrs:  BP Temp Temp src Pulse Resp SpO2 Weight  05/02/18 1746 133/89 - - (!) 112 - - -  05/02/18 1732 (!) 135/91 - - (!) 112 - - -  05/02/18 1717 (!) 141/72 - - (!) 104 - - -  05/02/18 1700 136/86 - - (!) 108 - 100 % -  05/02/18 1630 (!) 141/83 - - (!)  113 - 99 % -  05/02/18 1619 (!) 130/92 - - (!) 113 - - -  05/02/18 1554 (!) 148/90 98.4 F (36.9 C) Oral (!) 115 18 100 % (!) 146.2 kg    Physical Exam  Nursing note and vitals reviewed. Constitutional: She is oriented to person, place, and time. She appears well-developed and well-nourished. No distress.  HENT:  Head: Normocephalic.  Eyes: Pupils are equal, round, and reactive to light.  Cardiovascular: Normal rate, regular rhythm and normal heart sounds.  Respiratory: Effort normal and breath sounds normal. No respiratory distress.  GI: Soft. Bowel sounds are normal. She exhibits no distension. There is no tenderness.  Neurological: She is alert and oriented to person, place, and time. She has normal reflexes. No cranial nerve deficit. Coordination normal.  Skin: Skin is warm and dry.  Psychiatric: She has a normal mood and affect. Her behavior is normal. Judgment and thought content normal.   Fetal Tracing:  Baseline: 135 Variability: moderate Accels: 15x15 Decels: none  Toco: ui  MAU Course  Procedures Results for orders placed or performed during the hospital encounter of 05/02/18 (from the past 24 hour(s))  CBC     Status: Abnormal   Collection Time: 05/02/18  4:16 PM  Result Value Ref Range   WBC 11.3 (H) 4.0 - 10.5 K/uL   RBC 4.91 3.87 - 5.11 MIL/uL   Hemoglobin 11.0 (L) 12.0 - 15.0 g/dL   HCT 36.6 36.0 - 46.0 %   MCV 74.5 (L) 80.0 - 100.0 fL   MCH 22.4 (L) 26.0 - 34.0 pg   MCHC 30.1 30.0 - 36.0 g/dL   RDW 17.3 (H) 11.5 - 15.5 %   Platelets 277 150 - 400 K/uL   nRBC 0.0 0.0 - 0.2 %  Comprehensive metabolic panel     Status: Abnormal   Collection Time: 05/02/18  4:16 PM  Result Value Ref Range   Sodium 133 (L) 135 - 145 mmol/L   Potassium 3.8 3.5 - 5.1 mmol/L   Chloride 105 98 - 111 mmol/L   CO2 19 (L) 22 - 32 mmol/L   Glucose, Bld 89 70 - 99 mg/dL   BUN <5 (L) 6 - 20 mg/dL   Creatinine, Ser 0.35 (L) 0.44 - 1.00 mg/dL   Calcium 9.0 8.9 - 10.3 mg/dL    Total Protein 7.8 6.5 - 8.1 g/dL   Albumin 2.8 (L) 3.5 - 5.0 g/dL   AST 40 15 -  41 U/L   ALT 68 (H) 0 - 44 U/L   Alkaline Phosphatase 271 (H) 38 - 126 U/L   Total Bilirubin 0.4 0.3 - 1.2 mg/dL   GFR calc non Af Amer >60 >60 mL/min   GFR calc Af Amer >60 >60 mL/min   Anion gap 9 5 - 15  Protein / creatinine ratio, urine     Status: Abnormal   Collection Time: 05/02/18  4:17 PM  Result Value Ref Range   Creatinine, Urine 77.00 mg/dL   Total Protein, Urine 12 mg/dL   Protein Creatinine Ratio 0.16 (H) 0.00 - 0.15 mg/mg[Cre]   MDM CBC, CMP, Protein/creat ratio Fioricet PO- patient reports relief from HA  Consulted with Dr. Elly Modena- ok to discharge patient home to follow up in the office as scheduled  Assessment and Plan   1. Preexisting hypertension complicating pregnancy, antepartum   2. Pregnancy headache in third trimester   3. [redacted] weeks gestation of pregnancy    -Discharge home in stable condition -Strict preeclampsia precautions discussed -Patient advised to follow-[facility] with [facility] as [facility] for prenatal care -Patient may return to MAU as needed or if her condition were to change or worsen  Wende Mott CNM 05/02/2018, 5:54 PM

## 2018-05-02 NOTE — MAU Note (Addendum)
About 2 hrs ago, started feeling really funny, felt really tired and light headed.  Became flushed.  Has CHTN. On meds BID for BP. BP 158/96. Slight HA starting. Denies visual changes, epigastric pain or increase in swelling.

## 2018-05-02 NOTE — Discharge Instructions (Signed)

## 2018-05-02 NOTE — Progress Notes (Signed)
NST performed today was reviewed and was found to be reactive.  Continue recommended antenatal testing and prenatal care.  

## 2018-05-05 ENCOUNTER — Other Ambulatory Visit: Payer: Self-pay | Admitting: Obstetrics & Gynecology

## 2018-05-05 ENCOUNTER — Ambulatory Visit (INDEPENDENT_AMBULATORY_CARE_PROVIDER_SITE_OTHER): Payer: BLUE CROSS/BLUE SHIELD | Admitting: Obstetrics & Gynecology

## 2018-05-05 VITALS — BP 131/81 | HR 92 | Wt 318.5 lb

## 2018-05-05 DIAGNOSIS — O0993 Supervision of high risk pregnancy, unspecified, third trimester: Secondary | ICD-10-CM | POA: Diagnosis not present

## 2018-05-05 DIAGNOSIS — O10913 Unspecified pre-existing hypertension complicating pregnancy, third trimester: Secondary | ICD-10-CM

## 2018-05-05 DIAGNOSIS — Z3A33 33 weeks gestation of pregnancy: Secondary | ICD-10-CM

## 2018-05-05 DIAGNOSIS — O10919 Unspecified pre-existing hypertension complicating pregnancy, unspecified trimester: Secondary | ICD-10-CM

## 2018-05-05 DIAGNOSIS — O99343 Other mental disorders complicating pregnancy, third trimester: Secondary | ICD-10-CM | POA: Diagnosis not present

## 2018-05-05 DIAGNOSIS — R740 Nonspecific elevation of levels of transaminase and lactic acid dehydrogenase [LDH]: Secondary | ICD-10-CM | POA: Diagnosis not present

## 2018-05-05 DIAGNOSIS — O10013 Pre-existing essential hypertension complicating pregnancy, third trimester: Secondary | ICD-10-CM | POA: Diagnosis not present

## 2018-05-05 DIAGNOSIS — R7401 Elevation of levels of liver transaminase levels: Secondary | ICD-10-CM

## 2018-05-05 DIAGNOSIS — F419 Anxiety disorder, unspecified: Secondary | ICD-10-CM

## 2018-05-05 MED ORDER — HYDROXYZINE HCL 25 MG PO TABS: 25.0000 mg | ORAL_TABLET | Freq: Four times a day (QID) | ORAL | 2 refills | Status: DC | PRN

## 2018-05-05 NOTE — Progress Notes (Signed)
PRENATAL VISIT NOTE  Subjective:  Latoya Mora is a 22 y.o. G1P0000 at [redacted]w[redacted]d being seen today for ongoing prenatal care.  She is currently monitored for the following issues for this high-risk pregnancy and has Seasonal allergies; Anxiety and depression; Frequent headaches; Obesity, Class III, BMI 40-49.9 (morbid obesity) ([place]); PCOS (polycystic ovarian syndrome); Supervision of high-risk pregnancy; Ovarian cyst; and Chronic hypertension complicating pregnancy, antepartum on their problem list.  Patient reports some cramping. Also right arm tremors that happen periodically since she started Labetalol. Also very anxious about everything; was seen in MAU last week and had elevated LFTs and BP. Worried about early delivery  .   .  .  Movement: Present. Denies leaking of fluid.   The following portions of the patient's history were reviewed and updated as appropriate: allergies, current medications, past family history, past medical history, past social history, past surgical history and problem list. Problem list updated.  Objective:   Vitals:   05/05/18 1359  BP: 131/81  Pulse: 92  Weight: (!) 318 lb 8 oz (144.5 kg)    Fetal Status: Fetal Heart Rate (bpm): 154   Movement: Present     General:  Alert, oriented and cooperative. Patient is in no acute distress.  Skin: Skin is warm and dry. No rash noted.   Cardiovascular: Normal heart rate noted  Respiratory: Normal respiratory effort, no problems with respiration noted  Abdomen: Soft, gravid, appropriate for gestational age.  Pain/Pressure: Absent     Pelvic: Cervical exam deferred        Extremities: Normal range of motion.     Mental Status: Normal mood and affect. Normal behavior. Normal judgment and thought content.   CBC Latest Ref Rng & Units 05/02/2018 04/16/2018 12/14/2017  WBC 4.0 - 10.5 K/uL 11.3(H) 9.7 11.3(H)  Hemoglobin 12.0 - 15.0 g/dL 11.0(L) 10.3(L) 11.1(L)  Hematocrit 36.0 - 46.0 % 36.6 33.2(L) 35.0(L)  Platelets 150 -  400 K/uL 277 239 232   CMP Latest Ref Rng & Units 05/02/2018 04/16/2018 12/14/2017  Glucose 70 - 99 mg/dL 89 126(H) 87  BUN 6 - 20 mg/dL <5(L) <5(L) 9  Creatinine 0.44 - 1.00 mg/dL 0.35(L) 0.41(L) 0.46  Sodium 135 - 145 mmol/L 133(L) 132(L) 132(L)  Potassium 3.5 - 5.1 mmol/L 3.8 3.9 3.2(L)  Chloride 98 - 111 mmol/L 105 103 103  CO2 22 - 32 mmol/L 19(L) 20(L) 19(L)  Calcium 8.9 - 10.3 mg/dL 9.0 8.6(L) 8.7(L)  Total Protein 6.5 - 8.1 g/dL 7.8 6.9 7.6  Total Bilirubin 0.3 - 1.2 mg/dL 0.4 0.3 0.4  Alkaline Phos 38 - 126 U/L 271(H) 195(H) 87  AST 15 - 41 U/L 40 21 22  ALT 0 - 44 U/L 68(H) 31 29    Assessment and Plan:  Pregnancy: G1P0000 at [redacted]w[redacted]d  1. Chronic hypertension complicating pregnancy, antepartum 2. Elevated alanine aminotransferase (ALT) level Stable BP today.  Patient denies any headaches, visual symptoms, RUQ/epigastric pain.  Elevated LFT is concerning for possible superimposed preeclampsia, will recheck labs today. Discussed early delivery indications; may need delivery at 34 weeks for severe features or at 37 weeks for superimposed PEC without severe features.  Preeclampsia precautions reviewed. Unsure what is causing right arm tremors, offered to switch antihypertensive meds but she does not want to for now. NST performed today was reviewed and was found to be reactive.  Continue recommended antenatal testing and prenatal care. - Comprehensive metabolic panel - CBC - Protein / creatinine ratio, urine  3. Anxiety in pregnancy,  antepartum, third trimester Vistaril prescribed for anxiety as needed for now.  - hydrOXYzine (ATARAX/VISTARIL) 25 MG tablet; Take 1-2 tablets (25-50 mg total) by mouth every 6 (six) hours as needed for anxiety.  Dispense: 30 tablet; Refill: 2  4. Supervision of high risk pregnancy in third trimester Preterm labor symptoms and general obstetric precautions including but not limited to vaginal bleeding, contractions, leaking of fluid and fetal movement  were reviewed in detail with the patient. Please refer to After Visit Summary for other counseling recommendations.  Return in about 9 days (around 05/14/2018) for NST, OB Visit. Will need this weekly (schedule around ultrasounds at MFM)..  Future Appointments  Date Time Provider [place]  05/08/2018 10:45 AM [place] 2 [facility]-M[facility]US MFC-US  05/14/2018  9:45 AM [place] 2 WH-MFCUS MFC-US  05/14/2018  1:15 PM Caren Macadam, MD [facility]  05/21/2018  2:15 PM [place] 4 WH-MFCUS MFC-US  05/28/2018 10:45 AM [place] 5 WH-MFCUS MFC-US    Verita Schneiders, MD

## 2018-05-05 NOTE — Patient Instructions (Signed)
Hypertension During Pregnancy °Hypertension, commonly called high blood pressure, is when the force of blood pumping through your arteries is too strong. Arteries are blood vessels that carry blood from the heart throughout the body. Hypertension during pregnancy can cause problems for you and your baby. Your baby may be born early (prematurely) or may not weigh as much as he or she should at birth. Very bad cases of hypertension during pregnancy can be life-threatening. °Different types of hypertension can occur during pregnancy. These include: °· Chronic hypertension. This happens when: °? You have hypertension before pregnancy and it continues during pregnancy. °? You develop hypertension before you are [redacted] weeks pregnant, and it continues during pregnancy. °· Gestational hypertension. This is hypertension that develops after the 20th week of pregnancy. °· Preeclampsia, also called toxemia of pregnancy. This is a very serious type of hypertension that develops only during pregnancy. It affects the whole body, and it can be very dangerous for you and your baby. ° °Gestational hypertension and preeclampsia usually go away within 6 weeks after your baby is born. Women who have hypertension during pregnancy have a greater chance of developing hypertension later in life or during future pregnancies. °What are the causes? °The exact cause of hypertension is not known. °What increases the risk? °There are certain factors that make it more likely for you to develop hypertension during pregnancy. These include: °· Having hypertension during a previous pregnancy or prior to pregnancy. °· Being overweight. °· Being older than age 40. °· Being pregnant for the first time or being pregnant with more than one baby. °· Becoming pregnant using fertilization methods such as IVF (in vitro fertilization). °· Having diabetes, kidney problems, or systemic lupus erythematosus. °· Having a family history of hypertension. ° °What are the  signs or symptoms? °Chronic hypertension and gestational hypertension rarely cause symptoms. Preeclampsia causes symptoms, which may include: °· Increased protein in your urine. Your health care provider will check for this at every visit before you give birth (prenatal visit). °· Severe headaches. °· Sudden weight gain. °· Swelling of the hands, face, legs, and feet. °· Nausea and vomiting. °· Vision problems, such as blurred or double vision. °· Numbness in the face, arms, legs, and feet. °· Dizziness. °· Slurred speech. °· Sensitivity to bright lights. °· Abdominal pain. °· Convulsions. ° °How is this diagnosed? °You may be diagnosed with hypertension during a routine prenatal exam. At each prenatal visit, you may: °· Have a urine test to check for high amounts of protein in your urine. °· Have your blood pressure checked. A blood pressure reading is recorded as two numbers, such as "120 over 80" (or 120/80). The first ("top") number is called the systolic pressure. It is a measure of the pressure in your arteries when your heart beats. The second ("bottom") number is called the diastolic pressure. It is a measure of the pressure in your arteries as your heart relaxes between beats. Blood pressure is measured in a unit called mm Hg. A normal blood pressure reading is: °? Systolic: below 120. °? Diastolic: below 80. ° °The type of hypertension that you are diagnosed with depends on your test results and when your symptoms developed. °· Chronic hypertension is usually diagnosed before 20 weeks of pregnancy. °· Gestational hypertension is usually diagnosed after 20 weeks of pregnancy. °· Hypertension with high amounts of protein in the urine is diagnosed as preeclampsia. °· Blood pressure measurements that stay above 160 systolic, or above 110 diastolic, are   signs of severe preeclampsia. ° °How is this treated? °Treatment for hypertension during pregnancy varies depending on the type of hypertension you have and how  serious it is. °· If you take medicines called ACE inhibitors to treat chronic hypertension, you may need to switch medicines. ACE inhibitors should not be taken during pregnancy. °· If you have gestational hypertension, you may need to take blood pressure medicine. °· If you are at risk for preeclampsia, your health care provider may recommend that you take a low-dose aspirin every day to prevent high blood pressure during your pregnancy. °· If you have severe preeclampsia, you may need to be hospitalized so you and your baby can be monitored closely. You may also need to take medicine (magnesium sulfate) to prevent seizures and to lower blood pressure. This medicine may be given as an injection or through an IV tube. °· In some cases, if your condition gets worse, you may need to deliver your baby early. ° °Follow these instructions at home: °Eating and drinking °· Drink enough fluid to keep your urine clear or pale yellow. °· Eat a healthy diet that is low in salt (sodium). Do not add salt to your food. Check food labels to see how much sodium a food or beverage contains. °Lifestyle °· Do not use any products that contain nicotine or tobacco, such as cigarettes and e-cigarettes. If you need help quitting, ask your health care provider. °· Do not use alcohol. °· Avoid caffeine. °· Avoid stress as much as possible. Rest and get plenty of sleep. °General instructions °· Take over-the-counter and prescription medicines only as told by your health care provider. °· While lying down, lie on your left side. This keeps pressure off your baby. °· While sitting or lying down, raise (elevate) your feet. Try putting some pillows under your lower legs. °· Exercise regularly. Ask your health care provider what kinds of exercise are best for you. °· Keep all prenatal and follow-up visits as told by your health care provider. This is important. °Contact a health care provider if: °· You have symptoms that your health care  provider told you may require more treatment or monitoring, such as: °? Fever. °? Vomiting. °? Headache. °Get help right away if: °· You have severe abdominal pain or vomiting that does not get better with treatment. °· You suddenly develop swelling in your hands, ankles, or face. °· You gain 4 lbs (1.8 kg) or more in 1 week. °· You develop vaginal bleeding, or you have blood in your urine. °· You do not feel your baby moving as much as usual. °· You have blurred or double vision. °· You have muscle twitching or sudden tightening (spasms). °· You have shortness of breath. °· Your lips or fingernails turn blue. °This information is not intended to replace advice given to you by your health care provider. Make sure you discuss any questions you have with your health care provider. °Document Released: 02/27/2011 Document Revised: 12/30/2015 Document Reviewed: 11/25/2015 °Elsevier Interactive Patient Education © 2018 [employer] ° °

## 2018-05-06 ENCOUNTER — Inpatient Hospital Stay (HOSPITAL_COMMUNITY)
Admission: AD | Admit: 2018-05-06 | Discharge: 2018-05-12 | DRG: 833 | Disposition: A | Discharge status: Home / Self Care | Payer: BLUE CROSS/BLUE SHIELD | Attending: Obstetrics & Gynecology | Admitting: Obstetrics & Gynecology

## 2018-05-06 ENCOUNTER — Encounter (HOSPITAL_COMMUNITY): Payer: Self-pay

## 2018-05-06 DIAGNOSIS — O10013 Pre-existing essential hypertension complicating pregnancy, third trimester: Principal | ICD-10-CM | POA: Diagnosis present

## 2018-05-06 DIAGNOSIS — O10919 Unspecified pre-existing hypertension complicating pregnancy, unspecified trimester: Secondary | ICD-10-CM | POA: Diagnosis present

## 2018-05-06 DIAGNOSIS — R945 Abnormal results of liver function studies: Secondary | ICD-10-CM

## 2018-05-06 DIAGNOSIS — F329 Major depressive disorder, single episode, unspecified: Secondary | ICD-10-CM | POA: Diagnosis present

## 2018-05-06 DIAGNOSIS — O99213 Obesity complicating pregnancy, third trimester: Secondary | ICD-10-CM | POA: Diagnosis present

## 2018-05-06 DIAGNOSIS — Z3A34 34 weeks gestation of pregnancy: Secondary | ICD-10-CM | POA: Diagnosis not present

## 2018-05-06 DIAGNOSIS — F419 Anxiety disorder, unspecified: Secondary | ICD-10-CM | POA: Diagnosis present

## 2018-05-06 DIAGNOSIS — O99343 Other mental disorders complicating pregnancy, third trimester: Secondary | ICD-10-CM | POA: Diagnosis present

## 2018-05-06 DIAGNOSIS — Z87891 Personal history of nicotine dependence: Secondary | ICD-10-CM | POA: Diagnosis not present

## 2018-05-06 DIAGNOSIS — O10913 Unspecified pre-existing hypertension complicating pregnancy, third trimester: Secondary | ICD-10-CM | POA: Diagnosis not present

## 2018-05-06 DIAGNOSIS — R7989 Other specified abnormal findings of blood chemistry: Secondary | ICD-10-CM | POA: Diagnosis present

## 2018-05-06 LAB — COMPREHENSIVE METABOLIC PANEL
A/G RATIO: 1 — AB (ref 1.2–2.2)
ALK PHOS: 279 U/L — AB (ref 38–126)
ALT: 113 IU/L — AB (ref 0–32)
ALT: 123 U/L — ABNORMAL HIGH (ref 0–44)
ANION GAP: 10 (ref 5–15)
AST: 57 IU/L — ABNORMAL HIGH (ref 0–40)
AST: 70 U/L — ABNORMAL HIGH (ref 15–41)
Albumin: 3.5 g/dL (ref 3.5–5.5)
Albumin: 2.6 g/dL — ABNORMAL LOW (ref 3.5–5.0)
Alkaline Phosphatase: 345 IU/L — ABNORMAL HIGH (ref 39–117)
BILIRUBIN TOTAL: 0.2 mg/dL (ref 0.0–1.2)
BILIRUBIN TOTAL: 0.3 mg/dL (ref 0.3–1.2)
BUN/Creatinine Ratio: 7 — ABNORMAL LOW (ref 9–23)
BUN: 3 mg/dL — ABNORMAL LOW (ref 6–20)
BUN: <5 mg/dL — ABNORMAL LOW (ref 6–20)
CALCIUM: 9.8 mg/dL (ref 8.7–10.2)
CALCIUM: 9 mg/dL (ref 8.9–10.3)
CHLORIDE: 103 mmol/L (ref 96–106)
CO2: 17 mmol/L — ABNORMAL LOW (ref 20–29)
CO2: 20 mmol/L — ABNORMAL LOW (ref 22–32)
Chloride: 104 mmol/L (ref 98–111)
Creatinine, Ser: 0.44 mg/dL — ABNORMAL LOW (ref 0.57–1.00)
Creatinine, Ser: 0.42 mg/dL — ABNORMAL LOW (ref 0.44–1.00)
GFR calc Af Amer: 163 mL/min/{1.73_m2} (ref >59)
GFR calc Af Amer: >60 mL/min (ref >60)
GFR, EST NON AFRICAN AMERICAN: 142 mL/min/{1.73_m2} (ref >59)
Globulin, Total: 3.5 g/dL (ref 1.5–4.5)
Glucose, Bld: 89 mg/dL (ref 70–99)
Glucose: 72 mg/dL (ref 65–99)
POTASSIUM: 4.2 mmol/L (ref 3.5–5.2)
POTASSIUM: 3.7 mmol/L (ref 3.5–5.1)
SODIUM: 137 mmol/L (ref 134–144)
Sodium: 134 mmol/L — ABNORMAL LOW (ref 135–145)
TOTAL PROTEIN: 6.4 g/dL — AB (ref 6.5–8.1)
Total Protein: 7 g/dL (ref 6.0–8.5)

## 2018-05-06 LAB — CBC
HEMATOCRIT: 37.1 % (ref 36.0–46.0)
HEMOGLOBIN: 11.3 g/dL — AB (ref 12.0–15.0)
Hematocrit: 36.7 % (ref 34.0–46.6)
Hemoglobin: 11.1 g/dL (ref 11.1–15.9)
MCH: 21.2 pg — ABNORMAL LOW (ref 26.6–33.0)
MCH: 22.6 pg — ABNORMAL LOW (ref 26.0–34.0)
MCHC: 30.2 g/dL — AB (ref 31.5–35.7)
MCHC: 30.5 g/dL (ref 30.0–36.0)
MCV: 70 fL — ABNORMAL LOW (ref 79–97)
MCV: 74.1 fL — AB (ref 80.0–100.0)
Platelets: 278 10*3/uL (ref 150–450)
Platelets: 251 10*3/uL (ref 150–400)
RBC: 5.24 x10E6/uL (ref 3.77–5.28)
RBC: 5.01 MIL/uL (ref 3.87–5.11)
RDW: 17.1 % — AB (ref 12.3–15.4)
RDW: 17.4 % — AB (ref 11.5–15.5)
WBC: 12.1 10*3/uL — AB (ref 3.4–10.8)
WBC: 10.2 10*3/uL (ref 4.0–10.5)
nRBC: 0 % (ref 0.0–0.2)

## 2018-05-06 LAB — PROTEIN / CREATININE RATIO, URINE
CREATININE, UR: 77.2 mg/dL
PROTEIN UR: 15.1 mg/dL
PROTEIN/CREAT RATIO: 196 mg/g{creat} (ref 0–200)

## 2018-05-06 LAB — RAPID URINE DRUG SCREEN, HOSP PERFORMED
AMPHETAMINES: NOT DETECTED
Barbiturates: NOT DETECTED
Benzodiazepines: NOT DETECTED
Cocaine: NOT DETECTED
OPIATES: NOT DETECTED
Tetrahydrocannabinol: NOT DETECTED

## 2018-05-06 LAB — TYPE AND SCREEN
ABO/RH(D): B POS
Antibody Screen: NEGATIVE

## 2018-05-06 LAB — ABO/RH: ABO/RH(D): B POS

## 2018-05-06 MED ORDER — ASPIRIN EC 81 MG PO TBEC
81.0000 mg | DELAYED_RELEASE_TABLET | Freq: Every day | ORAL | Status: DC
  Administered 2018-05-06 – 2018-05-12 (×7): 81 mg via ORAL
  Filled 2018-05-06 (×9): qty 1

## 2018-05-06 MED ORDER — DOCUSATE SODIUM 100 MG PO CAPS
100.0000 mg | ORAL_CAPSULE | Freq: Every day | ORAL | Status: DC
  Administered 2018-05-07: 100 mg via ORAL
  Filled 2018-05-06: qty 1

## 2018-05-06 MED ORDER — LABETALOL HCL 5 MG/ML IV SOLN: 20.0000 mg | INTRAVENOUS | Status: DC | PRN

## 2018-05-06 MED ORDER — PRENATAL MULTIVITAMIN CH
1.0000 | ORAL_TABLET | Freq: Every day | ORAL | Status: DC
  Administered 2018-05-07: 1 via ORAL
  Filled 2018-05-06: qty 1

## 2018-05-06 MED ORDER — SODIUM CHLORIDE 0.9% FLUSH: 3.0000 mL | INTRAVENOUS | Status: DC | PRN

## 2018-05-06 MED ORDER — LABETALOL HCL 5 MG/ML IV SOLN: 40.0000 mg | INTRAVENOUS | Status: DC | PRN

## 2018-05-06 MED ORDER — NITROFURANTOIN MONOHYD MACRO 100 MG PO CAPS
100.0000 mg | ORAL_CAPSULE | Freq: Every day | ORAL | Status: DC
  Filled 2018-05-06: qty 1

## 2018-05-06 MED ORDER — NITROFURANTOIN MONOHYD MACRO 100 MG PO CAPS
100.0000 mg | ORAL_CAPSULE | Freq: Two times a day (BID) | ORAL | Status: DC
  Administered 2018-05-06: 100 mg via ORAL
  Filled 2018-05-06 (×2): qty 1

## 2018-05-06 MED ORDER — ZOLPIDEM TARTRATE 5 MG PO TABS
5.0000 mg | ORAL_TABLET | Freq: Every evening | ORAL | Status: DC | PRN
  Administered 2018-05-06 – 2018-05-11 (×5): 5 mg via ORAL
  Filled 2018-05-06 (×5): qty 1

## 2018-05-06 MED ORDER — CALCIUM CARBONATE ANTACID 500 MG PO CHEW
2.0000 | CHEWABLE_TABLET | ORAL | Status: DC | PRN
  Administered 2018-05-06 – 2018-05-11 (×10): 400 mg via ORAL
  Filled 2018-05-06 (×10): qty 2

## 2018-05-06 MED ORDER — HYDRALAZINE HCL 20 MG/ML IJ SOLN: 5.0000 mg | INTRAMUSCULAR | Status: DC | PRN

## 2018-05-06 MED ORDER — LABETALOL HCL 100 MG PO TABS
100.0000 mg | ORAL_TABLET | Freq: Two times a day (BID) | ORAL | Status: DC
  Administered 2018-05-06 – 2018-05-08 (×4): 100 mg via ORAL
  Filled 2018-05-06 (×4): qty 1

## 2018-05-06 MED ORDER — ACETAMINOPHEN 325 MG PO TABS: 650.0000 mg | ORAL_TABLET | ORAL | Status: DC | PRN

## 2018-05-06 MED ORDER — LORAZEPAM 1 MG PO TABS: 1.0000 mg | ORAL_TABLET | Freq: Four times a day (QID) | ORAL | Status: DC | PRN

## 2018-05-06 MED ORDER — SODIUM CHLORIDE 0.9 % IV SOLN: 250.0000 mL | INTRAVENOUS | Status: DC | PRN

## 2018-05-06 MED ORDER — HYDROXYZINE HCL 25 MG PO TABS
25.0000 mg | ORAL_TABLET | Freq: Four times a day (QID) | ORAL | Status: DC | PRN
  Administered 2018-05-07: 50 mg via ORAL
  Filled 2018-05-06 (×2): qty 2

## 2018-05-06 MED ORDER — HYDRALAZINE HCL 20 MG/ML IJ SOLN: 10.0000 mg | INTRAMUSCULAR | Status: DC | PRN

## 2018-05-06 MED ORDER — NITROFURANTOIN MONOHYD MACRO 100 MG PO CAPS: 100.0000 mg | ORAL_CAPSULE | Freq: Every day | ORAL | Status: DC

## 2018-05-06 MED ORDER — SODIUM CHLORIDE 0.9% FLUSH
3.0000 mL | Freq: Two times a day (BID) | INTRAVENOUS | Status: DC
  Administered 2018-05-06 – 2018-05-09 (×6): 3 mL via INTRAVENOUS

## 2018-05-06 MED ORDER — BETAMETHASONE SOD PHOS & ACET 6 (3-3) MG/ML IJ SUSP
12.0000 mg | INTRAMUSCULAR | Status: AC
  Administered 2018-05-06 – 2018-05-07 (×2): 12 mg via INTRAMUSCULAR
  Filled 2018-05-06 (×2): qty 2

## 2018-05-06 NOTE — Addendum Note (Signed)
Addended by: Verita Schneiders A on: 05/06/2018 02:55 PM   Modules accepted: Orders

## 2018-05-06 NOTE — Addendum Note (Signed)
Addended by: Verita Schneiders A on: 05/06/2018 02:18 PM   Modules accepted: Orders, SmartSet

## 2018-05-06 NOTE — Progress Notes (Addendum)
Patient's LFTs have increased more, ALT is now 113 from 68 (more than twice the normal limit) is AST is now elevated to 57. Stable platelets, creatinine and proteinuria.  This is concerning for possible superimposed severe preeclampsia. No other etiology for these elevated LFTs currently known.  Patient was called, she reported she only took two doses of Tylenol total in the last few days.   She denies any current headaches, visual symptoms, RUQ/epigastric pain but endorses nausea since our encounter yesterday, no emesis. Consulted Dr. Tama High (MFM) about this patient, he recommended inpatient observation for BP monitoring, NST bid, BPP with MFM tomorrow with consult.  Will also give patient betamethasone regimen (this could cause temporary lowering of the LFTs), consult Neonatology.  Patient and her husband informed, they agree with this plan and will come in soon for admission. HR OB unit RN in charge Sharman Crate) notified of impending patient admission, also notified HR OB Attending on call (Dr. Eloy End).   Latoya Schneiders, MD, [place] for [employer], [place]

## 2018-05-06 NOTE — H&P (Signed)
Faculty Practice Antenatal History and Physical  Latoya Mora KKX:[id number] DOB: 10/21/1995 DOA: 05/06/2018  Chief Complaint: hypertension  HPI: Latoya Mora is a 22 y.o. female G1P0000 with IUP at [redacted]w[redacted]d presenting for evaluation of abnormal lab results and preeclampsia  She is currently monitored for the following issues for this high-risk pregnancy and has Seasonal allergies; Anxiety and depression; Frequent headaches; Obesity, Class III, BMI 40-49.9 (morbid obesity) ([place]); PCOS (polycystic ovarian syndrome); Supervision of high-risk pregnancy; Ovarian cyst; and Chronic hypertension complicating pregnancy, antepartum on their problem list.   Review of Systems: Patient reports some cramping. Also right arm tremors that happen periodically since she started Labetalol. Also very anxious about everything; was seen in MAU last week and had elevated LFTs and BP. Worried about early delivery  .   .  .  Movement: Present. Denies leaking of fluid.    Pt complains of No headache or scotomata. Some abdominal and pelvic pressure.  Pt denies any contractions or bleeding.  Review of systems are otherwise negative    Past Medical History: Past Medical History:  Diagnosis Date  . Allergy   . Anxiety   . Depression   . Frequent headaches   . Hypertension   . PCOS (polycystic ovarian syndrome)     Past Surgical History: Past Surgical History:  Procedure Laterality Date  . TOOTH EXTRACTION      Obstetrical History: OB History    Gravida  1   Para  0   Term  0   Preterm  0   AB  0   Living  0     SAB  0   TAB  0   Ectopic  0   Multiple  0   Live Births  0            Social History: Social History   Socioeconomic History  . Marital status: Married    Spouse name: Not on file  . Number of children: Not on file  . Years of education: Not on file  . Highest education level: Not on file  Occupational History  . Not on file  Social Needs  . Financial resource  strain: Not on file  . Food insecurity:    Worry: Not on file    Inability: Not on file  . Transportation needs:    Medical: Not on file    Non-medical: Not on file  Tobacco Use  . Smoking status: Former Research scientist (life sciences)  . Smokeless tobacco: Never Used  . Tobacco comment: quit 11/12/2010   Substance and Sexual Activity  . Alcohol use: Not Currently  . Drug use: No  . Sexual activity: Yes    Birth control/protection: None  Lifestyle  . Physical activity:    Days per week: Not on file    Minutes per session: Not on file  . Stress: Not on file  Relationships  . Social connections:    Talks on phone: Not on file    Gets together: Not on file    Attends religious service: Not on file    Active member of club or organization: Not on file    Attends meetings of clubs or organizations: Not on file    Relationship status: Not on file  Other Topics Concern  . Not on file  Social History Narrative  . Not on file    Family History: Family History  Problem Relation Age of Onset  . Alcohol abuse Mother   . Drug abuse Mother   .  Depression Mother   . Anxiety disorder Mother   . Heart disease Mother   . Skin cancer Mother   . Scoliosis Mother   . Alcohol abuse Father   . Bipolar disorder Father   . Depression Father   . Osteochondroma Father   . COPD Maternal Grandmother   . Scoliosis Maternal Grandmother   . COPD Maternal Grandfather   . Skin cancer Maternal Grandfather   . Arthritis Paternal Grandmother   . Bipolar disorder Paternal Grandfather   . Osteochondroma Sister   . Osteochondroma Brother   . Cancer Neg Hx     Allergies: Allergies  Allergen Reactions  . Etodolac Nausea Only  . Meloxicam Other (See Comments)    Abdominal pain  . Flexeril [Cyclobenzaprine] Nausea Only  . Nsaids Other (See Comments)    Stomach pain  . Prozac [Fluoxetine Hcl]     Felt bad/awful    Medications Prior to Admission  Medication Sig Dispense Refill Last Dose  . aspirin EC 81 MG  tablet Take 81 mg by mouth daily.   Not Taking  . Elastic Bandages & Supports (COMFORT FIT MATERNITY SUPP LG) MISC 1 Units by Does not apply route daily as needed. (Patient not taking: Reported on 05/05/2018) 1 each 0 Not Taking  . hydrOXYzine (ATARAX/VISTARIL) 25 MG tablet Take 1-2 tablets (25-50 mg total) by mouth every 6 (six) hours as needed for anxiety. 30 tablet 2   . labetalol (NORMODYNE) 100 MG tablet Take 1 tablet (100 mg total) by mouth 2 (two) times daily. 60 tablet 1 Taking  . nitrofurantoin, macrocrystal-monohydrate, (MACROBID) 100 MG capsule Take 100 mg by mouth at bedtime.   Taking  . Prenat w/o A-FeCbGl-DSS-FA-DHA (CITRANATAL BLOOM DHA) 90-1 & 300 MG MISC Take 1 tablet by mouth daily. (Patient not taking: Reported on 05/05/2018) 30 each 11 Not Taking  . Prenatal Vit-Fe Fumarate-FA (PREPLUS) 27-1 MG TABS Take 1 tablet by mouth daily. 30 tablet 6 Taking    Physical Exam: BP (!) 144/104 (BP Location: Right Arm)   Pulse (!) 108   Temp 98.3 F (36.8 C) (Oral)   Resp 20   LMP 09/10/2017 (LMP Unknown)   SpO2 100%  General:  Alert, oriented and cooperative. Patient is in no acute distress.  Skin: Skin is warm and dry. No rash noted.   Cardiovascular: Normal heart rate noted  Respiratory: Normal respiratory effort, no problems with respiration noted  Abdomen: Soft, gravid, appropriate for gestational age.  Pain/Pressure: Absent     Pelvic: Cervical exam deferred        Extremities: Normal range of motion.     Mental Status: Normal mood and affect. Normal behavior. Normal judgment and thought content.        Prenatal Transfer Tool  Maternal Diabetes: No Genetic Screening: Normal Maternal Ultrasounds/Referrals: Normal Fetal Ultrasounds or other Referrals:  None Maternal Substance Abuse:  No Significant Maternal Medications:  Meds include: Other: labetalol Significant Maternal Lab Results: None             Labs on Admission:  Basic Metabolic Panel: Recent Labs  Lab  05/02/18 1616 05/05/18 1613  NA 133* 137  K 3.8 4.2  CL 105 103  CO2 19* 17*  GLUCOSE 89 72  BUN <5* 3*  CREATININE 0.35* 0.44*  CALCIUM 9.0 9.8   Liver Function Tests: Recent Labs  Lab 05/02/18 1616 05/05/18 1613  AST 40 57*  ALT 68* 113*  ALKPHOS 271* 345*  BILITOT 0.4 0.2  PROT 7.8  7.0  ALBUMIN 2.8* 3.5   No results for input(s): LIPASE, AMYLASE in the last 168 hours. No results for input(s): AMMONIA in the last 168 hours. CBC: Recent Labs  Lab 05/02/18 1616 05/05/18 1613  WBC 11.3* 12.1*  HGB 11.0* 11.1  HCT 36.6 36.7  MCV 74.5* 70*  PLT 277 278   CMP Latest Ref Rng & Units 05/05/2018 05/02/2018 04/16/2018  Glucose 65 - 99 mg/dL 72 89 126(H)  BUN 6 - 20 mg/dL 3(L) <5(L) <5(L)  Creatinine 0.57 - 1.00 mg/dL 0.44(L) 0.35(L) 0.41(L)  Sodium 134 - 144 mmol/L 137 133(L) 132(L)  Potassium 3.5 - 5.2 mmol/L 4.2 3.8 3.9  Chloride 96 - 106 mmol/L 103 105 103  CO2 20 - 29 mmol/L 17(L) 19(L) 20(L)  Calcium 8.7 - 10.2 mg/dL 9.8 9.0 8.6(L)  Total Protein 6.0 - 8.5 g/dL 7.0 7.8 6.9  Total Bilirubin 0.0 - 1.2 mg/dL 0.2 0.4 0.3  Alkaline Phos 39 - 117 IU/L 345(H) 271(H) 195(H)  AST 0 - 40 IU/L 57(H) 40 21  ALT 0 - 32 IU/L 113(H) 68(H) 31     CBG: No results for input(s): GLUCAP in the last 168 hours.  Radiological Exams on Admission: No results found.   Assessment/Plan Present on Admission: . Chronic hypertension affecting pregnancy  Patient's LFTs have increased more, AST is now 113 from 68 (more than twice the normal limit) is AST is now elevated to 57. Stable platelets, creatinine and proteinuria.  This is concerning for possible superimposed severe preeclampsia. No other etiology for these elevated LFTs currently known.  Patient was called, she reported she only took two doses of Tylenol total in the last few days.   She denies any current headaches, visual symptoms, RUQ/epigastric pain but endorses nausea since our encounter yesterday, no emesis. Consulted Dr.  Tama High (MFM) about this patient, he recommended inpatient observation for BP monitoring, NST bid, BPP with MFM tomorrow with consult.  Will also give patient betamethasone regimen (this could cause temporary lowering of the LFTs), consult Neonatology.  Patient and her husband informed, they agree with this plan and will come in soon for admission. HR OB unit RN in charge Sharman Crate) notified of impending patient admission, also notified HR OB Attending on call (Dr. Eloy End).  Code Status: full    Time spent: 20 minutes  Woodroe Mode, MD 05/06/2018 4:58 PM Faculty Practice Attending Physician [facility] Attending Phone #: [phone]  **Disclaimer: This note may have been dictated with voice recognition software. Similar sounding words can inadvertently be transcribed and this note may contain transcription errors which may not have been corrected upon publication of note.**

## 2018-05-07 ENCOUNTER — Inpatient Hospital Stay (HOSPITAL_COMMUNITY): Payer: BLUE CROSS/BLUE SHIELD

## 2018-05-07 ENCOUNTER — Other Ambulatory Visit: Payer: Self-pay

## 2018-05-07 ENCOUNTER — Inpatient Hospital Stay (HOSPITAL_BASED_OUTPATIENT_CLINIC_OR_DEPARTMENT_OTHER): Payer: BLUE CROSS/BLUE SHIELD

## 2018-05-07 DIAGNOSIS — Z3A34 34 weeks gestation of pregnancy: Secondary | ICD-10-CM

## 2018-05-07 DIAGNOSIS — O99213 Obesity complicating pregnancy, third trimester: Secondary | ICD-10-CM

## 2018-05-07 DIAGNOSIS — O10913 Unspecified pre-existing hypertension complicating pregnancy, third trimester: Secondary | ICD-10-CM

## 2018-05-07 DIAGNOSIS — O10013 Pre-existing essential hypertension complicating pregnancy, third trimester: Secondary | ICD-10-CM | POA: Diagnosis not present

## 2018-05-07 LAB — CREATININE CLEARANCE, URINE, 24 HOUR
CREAT CLEAR: 263 mL/min — AB (ref 75–115)
Collection Interval-CRCL: 24 hours
Creatinine, 24H Ur: 1664 mg/d (ref 600–1800)
Creatinine, Urine: 99.32 mg/dL
Urine Total Volume-CRCL: 1675 mL

## 2018-05-07 LAB — COMPREHENSIVE METABOLIC PANEL
ALT: 170 U/L — ABNORMAL HIGH (ref 0–44)
ANION GAP: 13 (ref 5–15)
AST: 102 U/L — ABNORMAL HIGH (ref 15–41)
Albumin: 2.8 g/dL — ABNORMAL LOW (ref 3.5–5.0)
Alkaline Phosphatase: 278 U/L — ABNORMAL HIGH (ref 38–126)
BUN: <5 mg/dL — ABNORMAL LOW (ref 6–20)
CHLORIDE: 102 mmol/L (ref 98–111)
CO2: 18 mmol/L — AB (ref 22–32)
Calcium: 8.9 mg/dL (ref 8.9–10.3)
Creatinine, Ser: 0.44 mg/dL (ref 0.44–1.00)
Glucose, Bld: 112 mg/dL — ABNORMAL HIGH (ref 70–99)
POTASSIUM: 3.7 mmol/L (ref 3.5–5.1)
SODIUM: 133 mmol/L — AB (ref 135–145)
Total Bilirubin: 0.8 mg/dL (ref 0.3–1.2)
Total Protein: 7.9 g/dL (ref 6.5–8.1)

## 2018-05-07 LAB — WET PREP, GENITAL
CLUE CELLS WET PREP: NONE SEEN
SPERM: NONE SEEN
TRICH WET PREP: NONE SEEN

## 2018-05-07 LAB — PROTEIN, URINE, 24 HOUR
COLLECTION INTERVAL-UPROT: 24 h
PROTEIN, 24H URINE: 235 mg/d — AB (ref 50–100)
PROTEIN, URINE: 14 mg/dL
Urine Total Volume-UPROT: 1675 mL

## 2018-05-07 LAB — CBC
HCT: 35.7 % — ABNORMAL LOW (ref 36.0–46.0)
Hemoglobin: 11 g/dL — ABNORMAL LOW (ref 12.0–15.0)
MCH: 22.7 pg — AB (ref 26.0–34.0)
MCHC: 30.8 g/dL (ref 30.0–36.0)
MCV: 73.8 fL — AB (ref 80.0–100.0)
PLATELETS: 257 10*3/uL (ref 150–400)
RBC: 4.84 MIL/uL (ref 3.87–5.11)
RDW: 17.6 % — AB (ref 11.5–15.5)
WBC: 10.2 10*3/uL (ref 4.0–10.5)
nRBC: 0 % (ref 0.0–0.2)

## 2018-05-07 NOTE — Consult Note (Signed)
Maternal-Fetal Medicine Name: Latoya Mora MRN: [id number] Requesting Provider: Emeterio Reeve, MD  I had the pleasure of seeing [name]. She is a G1 P0 at [place] 1d gestation with chronic hypertension and increased liver enzymes.   Her prenatal course was complicated by hypertension at around 8 weeks ([facility]). She later transferred care prenatal care at 32 weeks to [facility]. Patient started taking labetalol after an MAU visit on 04/16/18 for c/o abdominal pain.   Baseline AST was 21 and ALT 31 (04/16/18). On 05/02/18, the ALT was 68 and AST 40. Currently, ALT is 170 and AST 102. Platelet counts and creatinine levels have been normal.  Patient does not have severe headache, had noticed "stars" sometime. She has intermittent nausea and vomiting (during brushing her teeth). No chest pain or palpitations, or joint pains or abdominal pain. No vaginal bleeding. She reports good fetal movements. She had UTI in Aug and June 2019 and is taking macrobid prophylaxis. Her blood pressures have never been in the severe range.  Patient does not have gestational diabetes. She did not have fever or rashes. After admission, she received first dose of betamethasone.  PMH: No history of chronic medical conditions. PSH: Nil of note. Medications: Prenatal vitamins, visteril (started 2 days ago), aspirin, labetalol, macrobid. Allergies: Stomach pain with NSAIDS. Other medication mentioned in EPIC allergies include Etodolac, meloxicam, flexeril, Prozac (not brought up by the patient). Social: Denies tobacco or drug or alcohol use. She has been married 2 years and her husband is in good health. Family: No history of venous thromboembolism in the family. Father has COPD and liver cirrhosis (alcohol). Mother has some "heart valve problem."  P/E: Patient is comfortably lying in bed; not in pain. HEENT: Normal; no lymphadenopathy. Abd: Soft gravid uterus; no tenderness.  No flank pain. Pedal edema  present. NST reactive.  Today's Labs (LFT values in parenthesis are those of 05/06/18): Electrolytes normal, ALT 170 (123), AST 102 (70), creatinine 0.44, Hb 11, Hct 35.7, PLT 257, WBC 10.2. Protein/creatinine ratio not increased (196 mg/g creat).  I counseled the patient on the following: Chronic hypertension and increased liver enzymes: -Patient does not have severe range blood pressures. Although differential diagnosis include HELLP syndrome, it seems unlikely given that the platelet counts are normal and the patient does not have severe range blood pressures. I explained the possibility of superimposed preeclampsia and the reason for hospitalization for close monitoring of her blood pressures and symptoms. -Patient did not have fever or rashes and infectious origin seems unlikely. -Drug-induced hepatitis is a possibility. Labetalol has known to cause hepatic injury in some cases. Patient has been taking labetalol for about 3 weeks.  -I recommend discontinuing all medications except labetalol and aspirin for a few days. Alternatively, we can substitute with Procardia. -I discussed other causes including gall stones and recommended right upper quadrant ultrasound. -Betamethasone can lead to decrease liver enzymes.  -I also informed the patient that we may not be able to identify the cause and if liver enzymes show significant increase, we would consider delivery (assuming it be a manifestation of severe preeclampsia). -Patient agreed with my recommendations. -She understands that she will be managed as inpatient over the next few days.  Recommendations: -Discontinue all medications except labetalol and aspirin. -Twice-daily NST. -BP monitoring as per protocol. -Right upper quadrant ultrasound. -Repeat LFTs on Friday (05/09/18). -Hepatitis C antibody (next blood draw). -BPP tomorrow or with RUQ ultrasound. -Delivery to be considered if significant increase liver enzymes are  seen or other  severe features of preeclampsia are present.  Thank you for consult. Please do not hesitate to contact me at the [facility] if you have any questions.

## 2018-05-07 NOTE — Progress Notes (Signed)
Patient ID: Latoya Mora, female   DOB: [birth year], 22 y.o.   MRN: [id number] [place]) NOTE  Latoya Mora is a 22 y.o. G1P0000 at [redacted]w[redacted]d by best clinical estimate who is admitted for hypertension and abnormal LFT.   Fetal presentation is cephalic. Length of Stay:  1  Days  Subjective:  Patient reports the fetal movement as active. Patient reports uterine contraction  activity as none. Patient reports  vaginal bleeding as none. Patient describes fluid per vagina as None.  Vitals:  Blood pressure 128/73, pulse (!) 46, temperature 98.3 F (36.8 C), temperature source Oral, resp. rate 18, height 5\' 8"  (1.727 m), weight (!) 143.2 kg, last menstrual period 09/10/2017, SpO2 96 %. Physical Examination:  General appearance - alert, well appearing, and in no distress Heart - normal rate and regular rhythm Abdomen - soft, nontender, nondistended Fundal Height:  size equals dates Extremities: extremities normal, atraumatic, no cyanosis or edema and Homans sign is negative, no sign of DVT with DTRs 2+ bilaterally Membranes:intact  Fetal Monitoring:     Fetal Heart Rate A  Mode External filed at 05/07/2018 0840  Baseline Rate (A) 135 bpm filed at 05/07/2018 0840  Variability 6-25 BPM filed at 05/07/2018 0840  Accelerations 15 x 15 filed at 05/07/2018 0840  Decelerations None filed at 05/07/2018 0840     Labs:  Results for orders placed or performed during the hospital encounter of 05/06/18 (from the past 24 hour(s))  Urine rapid drug screen (hosp performed)not at [facility]   Collection Time: 05/06/18  4:21 PM  Result Value Ref Range   Opiates NONE DETECTED NONE DETECTED   Cocaine NONE DETECTED NONE DETECTED   Benzodiazepines NONE DETECTED NONE DETECTED   Amphetamines NONE DETECTED NONE DETECTED   Tetrahydrocannabinol NONE DETECTED NONE DETECTED   Barbiturates NONE DETECTED NONE DETECTED  Culture, beta strep (group b only)   Collection Time: 05/06/18  4:49 PM   Result Value Ref Range   Specimen Description      VAGINAL/RECTAL Performed at [facility], [address] [place], [place] [address]    Special Requests      NONE Performed at [facility], [address] [place], [place] [address]    Culture      CULTURE REINCUBATED FOR BETTER GROWTH Performed at [facility], [place] [address] [place],  [address]    Report Status PENDING   CBC   Collection Time: 05/06/18  5:25 PM  Result Value Ref Range   WBC 10.2 4.0 - 10.5 K/uL   RBC 5.01 3.87 - 5.11 MIL/uL   Hemoglobin 11.3 (L) 12.0 - 15.0 g/dL   HCT 37.1 36.0 - 46.0 %   MCV 74.1 (L) 80.0 - 100.0 fL   MCH 22.6 (L) 26.0 - 34.0 pg   MCHC 30.5 30.0 - 36.0 g/dL   RDW 17.4 (H) 11.5 - 15.5 %   Platelets 251 150 - 400 K/uL   nRBC 0.0 0.0 - 0.2 %  Comprehensive metabolic panel   Collection Time: 05/06/18  5:25 PM  Result Value Ref Range   Sodium 134 (L) 135 - 145 mmol/L   Potassium 3.7 3.5 - 5.1 mmol/L   Chloride 104 98 - 111 mmol/L   CO2 20 (L) 22 - 32 mmol/L   Glucose, Bld 89 70 - 99 mg/dL   BUN <5 (L) 6 - 20 mg/dL   Creatinine, Ser 0.42 (L) 0.44 - 1.00 mg/dL   Calcium 9.0 8.9 - 10.3 mg/dL  Total Protein 6.4 (L) 6.5 - 8.1 g/dL   Albumin 2.6 (L) 3.5 - 5.0 g/dL   AST 70 (H) 15 - 41 U/L   ALT 123 (H) 0 - 44 U/L   Alkaline Phosphatase 279 (H) 38 - 126 U/L   Total Bilirubin 0.3 0.3 - 1.2 mg/dL   GFR calc non Af Amer >60 >60 mL/min   GFR calc Af Amer >60 >60 mL/min   Anion gap 10 5 - 15  Type and screen   Collection Time: 05/06/18  5:25 PM  Result Value Ref Range   ABO/RH(D) B POS    Antibody Screen NEG    Sample Expiration      05/09/2018 Performed at [facility], [address] [place], [place] [address]   ABO/Rh   Collection Time: 05/06/18  5:25 PM  Result Value Ref Range   ABO/RH(D)      B POS Performed at [facility], [address], [place], [place] [address]   CBC   Collection Time: 05/07/18  5:52 AM  Result Value Ref Range   WBC  10.2 4.0 - 10.5 K/uL   RBC 4.84 3.87 - 5.11 MIL/uL   Hemoglobin 11.0 (L) 12.0 - 15.0 g/dL   HCT 35.7 (L) 36.0 - 46.0 %   MCV 73.8 (L) 80.0 - 100.0 fL   MCH 22.7 (L) 26.0 - 34.0 pg   MCHC 30.8 30.0 - 36.0 g/dL   RDW 17.6 (H) 11.5 - 15.5 %   Platelets 257 150 - 400 K/uL   nRBC 0.0 0.0 - 0.2 %  Comprehensive metabolic panel   Collection Time: 05/07/18  5:52 AM  Result Value Ref Range   Sodium 133 (L) 135 - 145 mmol/L   Potassium 3.7 3.5 - 5.1 mmol/L   Chloride 102 98 - 111 mmol/L   CO2 18 (L) 22 - 32 mmol/L   Glucose, Bld 112 (H) 70 - 99 mg/dL   BUN <5 (L) 6 - 20 mg/dL   Creatinine, Ser 0.44 0.44 - 1.00 mg/dL   Calcium 8.9 8.9 - 10.3 mg/dL   Total Protein 7.9 6.5 - 8.1 g/dL   Albumin 2.8 (L) 3.5 - 5.0 g/dL   AST 102 (H) 15 - 41 U/L   ALT 170 (H) 0 - 44 U/L   Alkaline Phosphatase 278 (H) 38 - 126 U/L   Total Bilirubin 0.8 0.3 - 1.2 mg/dL   GFR calc non Af Amer >60 >60 mL/min   GFR calc Af Amer >60 >60 mL/min   Anion gap 13 5 - 15  Wet prep, genital   Collection Time: 05/07/18 11:08 AM  Result Value Ref Range   Yeast Wet Prep HPF POC PRESENT (A) NONE SEEN   Trich, Wet Prep NONE SEEN NONE SEEN   Clue Cells Wet Prep HPF POC NONE SEEN NONE SEEN   WBC, Wet Prep HPF POC FEW (A) NONE SEEN   Sperm NONE SEEN     Imaging Studies:     Currently EPIC will not allow sonographic studies to automatically populate into notes.  In the meantime, copy and paste results into note or free text.  Medications:  Scheduled . aspirin EC  81 mg Oral Daily  . betamethasone acetate-betamethasone sodium phosphate  12 mg Intramuscular Q24H  . docusate sodium  100 mg Oral Daily  . labetalol  100 mg Oral BID  . nitrofurantoin (macrocrystal-monohydrate)  100 mg Oral QHS  . prenatal multivitamin  1 tablet Oral Q1200  . sodium chloride  flush  3 mL Intravenous Q12H   I have reviewed the patient's current medications.  ASSESSMENT: Patient Active Problem List   Diagnosis Date Noted  . Chronic  hypertension affecting pregnancy 05/06/2018  . Chronic hypertension complicating pregnancy, antepartum 04/24/2018  . Supervision of high-risk pregnancy 11/04/2017  . Ovarian cyst 11/04/2017  . PCOS (polycystic ovarian syndrome) 10/03/2017  . Obesity, Class III, BMI 40-49.9 (morbid obesity) ([employer]) 04/03/2017  . Seasonal allergies 09/18/2016  . Anxiety and depression 09/18/2016  . Frequent headaches 09/18/2016    PLAN: Follow LFT and [place] RUQ ordered. MFM to consult  Emeterio Reeve 05/07/2018,12:22 PM

## 2018-05-08 ENCOUNTER — Observation Stay (HOSPITAL_COMMUNITY): Admission: RE | Admit: 2018-05-08 | Payer: BLUE CROSS/BLUE SHIELD | Source: Ambulatory Visit

## 2018-05-08 ENCOUNTER — Encounter: Payer: BLUE CROSS/BLUE SHIELD | Admitting: Obstetrics & Gynecology

## 2018-05-08 LAB — CULTURE, BETA STREP (GROUP B ONLY)

## 2018-05-08 MED ORDER — NIFEDIPINE ER OSMOTIC RELEASE 30 MG PO TB24
30.0000 mg | ORAL_TABLET | Freq: Every day | ORAL | Status: DC
  Administered 2018-05-08 – 2018-05-09 (×2): 30 mg via ORAL
  Filled 2018-05-08 (×2): qty 1

## 2018-05-08 NOTE — Progress Notes (Signed)
Patient ID: Latoya Mora, female   DOB: Dec 20, 1995, 22 y.o.   MRN: [id number] [place]) NOTE  Latoya Mora is a 22 y.o. G1P0000 with Estimated Date of Delivery: 06/17/18    [redacted]w[redacted]d  who is admitted for elevated LFTs in a patient with chronic hypertension.    Fetal presentation is unsure. Length of Stay:  2  Days  Date of admission:05/06/2018  Subjective: No headache or CNS symptoms Patient reports the fetal movement as active. Patient reports uterine contraction  activity as none. Patient reports  vaginal bleeding as none. Patient describes fluid per vagina as None.  Vitals:  Blood pressure 123/73, pulse (!) 102, temperature (!) 97.5 F (36.4 C), temperature source Oral, resp. rate 20, height 5\' 8"  (1.727 m), weight (!) 143.2 kg, last menstrual period 09/10/2017, SpO2 100 %. Vitals:   05/07/18 1635 05/07/18 1938 05/07/18 2331 05/08/18 0517  BP: 132/86 129/87 (!) 142/79 123/73  Pulse: (!) 118 (!) 115 (!) 108 (!) 102  Resp: 18 20 20 20   Temp: 97.7 F (36.5 C) 98 F (36.7 C) 98.4 F (36.9 C) (!) 97.5 F (36.4 C)  TempSrc: Oral Oral Oral Oral  SpO2: 97% 98% 98% 100%  Weight:      Height:       Physical Examination:  General appearance - alert, well appearing, and in no distress Abdomen - soft, nontender, nondistended, no masses or organomegaly Specifically no RUQ pain Fundal Height:  size equals dates Pelvic Exam:  examination not indicated Cervical Exam: Not evaluated. Extremities: extremities normal, atraumatic, no cyanosis or edema with DTRs 2+ bilaterally Membranes:intact  Fetal Monitoring:  Reactive NST     Labs:  Results for orders placed or performed during the hospital encounter of 05/06/18 (from the past 24 hour(s))  Wet prep, genital   Collection Time: 05/07/18 11:08 AM  Result Value Ref Range   Yeast Wet Prep HPF POC PRESENT (A) NONE SEEN   Trich, Wet Prep NONE SEEN NONE SEEN   Clue Cells Wet Prep HPF POC NONE SEEN NONE SEEN   WBC, Wet Prep HPF POC FEW (A) NONE SEEN   Sperm NONE SEEN   Creatinine clearance, urine, 24 hour   Collection Time: 05/07/18  4:45 PM  Result Value Ref Range   Urine Total Volume-CRCL 1,675 mL   Collection Interval-CRCL 24 hours   Creatinine, Urine 99.32 mg/dL   Creatinine, 24H Ur 1,664 600 - 1,800 mg/day   Creatinine Clearance 263 (H) 75 - 115 mL/min  Protein, urine, 24 hour   Collection Time: 05/07/18  4:45 PM  Result Value Ref Range   Urine Total Volume-UPROT 1,675 mL   Collection Interval-UPROT 24 hours   Protein, Urine 14 mg/dL   Protein, 24H Urine 235 (H) 50 - 100 mg/day    Imaging Studies:      Medications:  Scheduled . aspirin EC  81 mg Oral Daily  . labetalol  100 mg Oral BID  . sodium chloride flush  3 mL Intravenous Q12H   I have reviewed the patient's current medications.  ASSESSMENT: G1P0000 [redacted]w[redacted]d Estimated Date of Delivery: 06/17/18  Isolated Elevated LFTs  Patient Active Problem List   Diagnosis Date Noted  . Chronic hypertension affecting pregnancy 05/06/2018  . Chronic hypertension complicating pregnancy, antepartum 04/24/2018  . Supervision of high-risk pregnancy 11/04/2017  . Ovarian cyst 11/04/2017  . PCOS (polycystic ovarian syndrome) 10/03/2017  . Obesity, Class III, BMI 40-49.9 (morbid obesity) ([place]) 04/03/2017  . Seasonal allergies 09/18/2016  . Anxiety and  depression 09/18/2016  . Frequent headaches 09/18/2016    PLAN: >S/P BMZ x 2  Interesting presentation in this minimally chronic hypertensive patient No other S/S pre eclampsia and would at least expect a BP elevation from her baseline to cause hepatocellular damage to the extent to cause elevated LFTs.  RUQ scan is negative. Repeat in am and check hepatitis C with management based on clinical course, pt aware of the uncertainty of the clinical picture at this point  Florian Buff 05/08/2018,7:20 AM

## 2018-05-09 LAB — COMPREHENSIVE METABOLIC PANEL
ALBUMIN: 2.5 g/dL — AB (ref 3.5–5.0)
ALT: 217 U/L — AB (ref 0–44)
AST: 78 U/L — AB (ref 15–41)
Alkaline Phosphatase: 233 U/L — ABNORMAL HIGH (ref 38–126)
Anion gap: 7 (ref 5–15)
BILIRUBIN TOTAL: 0.4 mg/dL (ref 0.3–1.2)
CO2: 21 mmol/L — AB (ref 22–32)
Calcium: 8.2 mg/dL — ABNORMAL LOW (ref 8.9–10.3)
Chloride: 109 mmol/L (ref 98–111)
Creatinine, Ser: 0.37 mg/dL — ABNORMAL LOW (ref 0.44–1.00)
GFR calc Af Amer: >60 mL/min (ref >60)
GFR calc non Af Amer: >60 mL/min (ref >60)
GLUCOSE: 110 mg/dL — AB (ref 70–99)
Potassium: 3.6 mmol/L (ref 3.5–5.1)
SODIUM: 137 mmol/L (ref 135–145)
Total Protein: 6.6 g/dL (ref 6.5–8.1)

## 2018-05-09 MED ORDER — NIFEDIPINE ER OSMOTIC RELEASE 30 MG PO TB24
60.0000 mg | ORAL_TABLET | Freq: Every day | ORAL | Status: DC
  Administered 2018-05-10 – 2018-05-12 (×3): 60 mg via ORAL
  Filled 2018-05-09 (×4): qty 2

## 2018-05-09 NOTE — Progress Notes (Signed)
Patient ID: Latoya Mora, female   DOB: 01/28/1996, 22 y.o.   MRN: [id number] [place]) NOTE  Latoya Mora is a 22 y.o. G1P0000 at [redacted]w[redacted]d by best clinical estimate who is admitted for [facility] and elevated LFT.   Fetal presentation is cephalic. Length of Stay:  3  Days  Subjective: No headache or itching Patient reports the fetal movement as active. Patient reports uterine contraction  activity as none. Patient reports  vaginal bleeding as none. Patient describes fluid per vagina as None.  Vitals:  Blood pressure (!) 146/89, pulse (!) 105, temperature 98.3 F (36.8 C), temperature source Oral, resp. rate 18, height 5\' 8"  (1.727 m), weight (!) 145.5 kg, last menstrual period 09/10/2017, SpO2 100 %. Physical Examination:  General appearance - alert, well appearing, and in no distress Heart - normal rate and regular rhythm Abdomen - soft, nontender, nondistended Fundal Height:  size equals dates Cervical Exam: Not evaluated.  Extremities: extremities normal, atraumatic, no cyanosis or edema and Homans sign is negative, no sign of DVT  Membranes:intact  Fetal Monitoring:  Baseline: 145 bpm, Variability: Good {> 6 bpm), Accelerations: Reactive and Decelerations: Absent  Labs:  Results for orders placed or performed during the hospital encounter of 05/06/18 (from the past 24 hour(s))  Comprehensive metabolic panel   Collection Time: 05/09/18  5:28 AM  Result Value Ref Range   Sodium 137 135 - 145 mmol/L   Potassium 3.6 3.5 - 5.1 mmol/L   Chloride 109 98 - 111 mmol/L   CO2 21 (L) 22 - 32 mmol/L   Glucose, Bld 110 (H) 70 - 99 mg/dL   BUN <5 (L) 6 - 20 mg/dL   Creatinine, Ser 0.37 (L) 0.44 - 1.00 mg/dL   Calcium 8.2 (L) 8.9 - 10.3 mg/dL   Total Protein 6.6 6.5 - 8.1 g/dL   Albumin 2.5 (L) 3.5 - 5.0 g/dL   AST 78 (H) 15 - 41 U/L   ALT 217 (H) 0 - 44 U/L   Alkaline Phosphatase 233 (H) 38 - 126 U/L   Total Bilirubin 0.4 0.3 - 1.2 mg/dL   GFR calc non Af Amer  >60 >60 mL/min   GFR calc Af Amer >60 >60 mL/min   Anion gap 7 5 - 15    Imaging Studies:      Medications:  Scheduled . aspirin EC  81 mg Oral Daily  . [START ON 05/10/2018] NIFEdipine  60 mg Oral Daily  . sodium chloride flush  3 mL Intravenous Q12H   I have reviewed the patient's current medications.  ASSESSMENT: Patient Active Problem List   Diagnosis Date Noted  . Chronic hypertension affecting pregnancy 05/06/2018  . Chronic hypertension complicating pregnancy, antepartum 04/24/2018  . Supervision of high-risk pregnancy 11/04/2017  . Ovarian cyst 11/04/2017  . PCOS (polycystic ovarian syndrome) 10/03/2017  . Obesity, Class III, BMI 40-49.9 (morbid obesity) ([place]) 04/03/2017  . Seasonal allergies 09/18/2016  . Anxiety and depression 09/18/2016  . Frequent headaches 09/18/2016    PLAN: Discussed LFT with Dr. Donalee Citrin and with decreased AST and fair BP control we will continue observation in hospital through the weekend and repeat labs in 3 days. Increased Procardia to 60 mg BID  Latoya Mora 05/09/2018,10:29 AM

## 2018-05-09 NOTE — Progress Notes (Signed)
Maternal-Fetal Medicine  Patient feels well. No headache or nausea or right upper quadrant pain or vaginal bleeding. She reports good fetal movements.  RUQ ultrasound showed debris consistent with cholesterol crystals. No gall stones.  Antihypertensive changed to Procardia and labetalol was discontinued. Patient had completed a course of antenatal corticosteroids.  P/E: Patient is comfortably lying in bed; not in pain. Abd: Soft gravid uterus. BP 121-146/73-98 mm Hg.  Afebrile. NST is reactive.  Labs: AST 78 (decrease from 110), ALT 217 (increase from 170), creatinine 0.37.  I reassured the patient of the lab results that showed some improvement in AST. It is difficult to estimate steroid influence. However, the patient does not have symptoms of severe features of preeclampsia.  We discussed continuation of inpatient management for a few more days and the patient agreed.  Recommendations: -Bile acids. -Repeat LFTs on Monday (05/12/18) or earlier as indicated. -Close monitoring of BP.

## 2018-05-10 LAB — URINALYSIS, ROUTINE W REFLEX MICROSCOPIC
BILIRUBIN URINE: NEGATIVE
GLUCOSE, UA: NEGATIVE mg/dL
HGB URINE DIPSTICK: NEGATIVE
Ketones, ur: 20 mg/dL — AB
Leukocytes, UA: NEGATIVE
Nitrite: NEGATIVE
Protein, ur: NEGATIVE mg/dL
SPECIFIC GRAVITY, URINE: 1.016 (ref 1.005–1.030)
pH: 6 (ref 5.0–8.0)

## 2018-05-10 LAB — BILE ACIDS, TOTAL: BILE ACIDS TOTAL: 7.5 umol/L (ref 0.0–10.0)

## 2018-05-10 LAB — TYPE AND SCREEN
ABO/RH(D): B POS
ANTIBODY SCREEN: NEGATIVE

## 2018-05-10 LAB — GLUCOSE, CAPILLARY: Glucose-Capillary: 85 mg/dL (ref 70–99)

## 2018-05-10 LAB — HEPATITIS C ANTIBODY: HCV Ab: <0.1 {s_co_ratio} (ref 0.0–0.9)

## 2018-05-10 NOTE — Progress Notes (Signed)
Patient ID: Latoya Mora, female   DOB: Jul 26, 1995, 22 y.o.   MRN: [id number] [place] ANTEPARTUM COMPREHENSIVE PROGRESS NOTE  Latoya Mora is a 22 y.o. G1P0000 at [redacted]w[redacted]d  who is admitted for elevated liver enzymes and elevated BPs.    Fetal presentation is cephalic. Length of Stay:  4  Days  Subjective: Pt denies complaints.. NO HA and no visual changes.   Patient reports good fetal movement.  She reports no uterine contractions, no bleeding and no loss of fluid per vagina.  Vitals:  Blood pressure 134/90, pulse (!) 107, temperature 98.5 F (36.9 C), temperature source Oral, resp. rate 17, height 5\' 8"  (1.727 m), weight (!) 145.6 kg, last menstrual period 09/10/2017, SpO2 100 %. Physical Examination: General appearance - alert, well appearing, and in no distress, oriented to person, place, and time and sleeping upon entry Cervical Exam: Not evaluated Extremities: extremities normal, atraumatic, no cyanosis or edema  Membranes:intact  Fetal Monitoring:  Baseline: 150's bpm, Variability: Good {> 6 bpm), Accelerations: Reactive and Decelerations: Absent  Labs:  CMP Latest Ref Rng & Units 05/09/2018 05/07/2018 05/06/2018  Glucose 70 - 99 mg/dL 110(H) 112(H) 89  BUN 6 - 20 mg/dL <5(L) <5(L) <5(L)  Creatinine 0.44 - 1.00 mg/dL 0.37(L) 0.44 0.42(L)  Sodium 135 - 145 mmol/L 137 133(L) 134(L)  Potassium 3.5 - 5.1 mmol/L 3.6 3.7 3.7  Chloride 98 - 111 mmol/L 109 102 104  CO2 22 - 32 mmol/L 21(L) 18(L) 20(L)  Calcium 8.9 - 10.3 mg/dL 8.2(L) 8.9 9.0  Total Protein 6.5 - 8.1 g/dL 6.6 7.9 6.4(L)  Total Bilirubin 0.3 - 1.2 mg/dL 0.4 0.8 0.3  Alkaline Phos 38 - 126 U/L 233(H) 278(H) 279(H)  AST 15 - 41 U/L 78(H) 102(H) 70(H)  ALT 0 - 44 U/L 217(H) 170(H) 123(H)   Imaging Studies:    BPP8/8 on 05/07/2018  Medications:  Scheduled . aspirin EC  81 mg Oral Daily  . NIFEdipine  60 mg Oral Daily  . sodium chloride flush  3 mL Intravenous Q12H   I have reviewed the patient's current  medications.  ASSESSMENT: Patient Active Problem List   Diagnosis Date Noted  . Chronic hypertension affecting pregnancy 05/06/2018  . Chronic hypertension complicating pregnancy, antepartum 04/24/2018  . Supervision of high-risk pregnancy 11/04/2017  . Ovarian cyst 11/04/2017  . PCOS (polycystic ovarian syndrome) 10/03/2017  . Obesity, Class III, BMI 40-49.9 (morbid obesity) ([place]) 04/03/2017  . Seasonal allergies 09/18/2016  . Anxiety and depression 09/18/2016  . Frequent headaches 09/18/2016    PLAN: Per MFM, maintain inpt status Repeat Labs in 2 days Follow BPs- no severe range BPs noted.  Continue routine antenatal care.   Latoya Mora 05/10/2018,7:21 AM

## 2018-05-10 NOTE — Consult Note (Signed)
[place] 05/10/2018    9:18 PM  Neonatal Medicine Consultation         Latoya Mora          MRN:  [id number]  I was called at the request of the patient's obstetrician (Dr. Roselie Awkward) to speak to this patient due to potential premature delivery at 34 weeks and beyond.  The patient's prenatal course includes chronic hypertension and increased LFT's.  She is 34 weeks currently.  She is admitted to the [facility] unit, and is receiving treatment that includes [place] (11/12 and 11/13), close observations for evidence of preeclampsia.  The baby is a female.  I reviewed expectations for a baby born at 58 weeks and beyond.  I described how we provide respiratory and feeding support.  Mom plans to breast feed, which I encouraged as best for the baby (with supplementations for needed calories).  I let mom know that the baby's outlook generally improves the longer she remains undelivered.  I spent 20 minutes reviewing the record, speaking to the patient, and entering appropriate documentation.  More than 50% of the time was spent face to face with patient.   _____________________ Electronically Signed By: Roosevelt Locks, MD Neonatologist

## 2018-05-10 NOTE — Progress Notes (Signed)
Called into patient's room because she suddenly feels hot, nauseous, and has lower abdominal and back pain. Uterine fundus nontender. Denies leaking fluid or vaginal bleeding. Placed on efm, FHR 160.

## 2018-05-10 NOTE — Progress Notes (Signed)
States she got up to the bathroom and had diarrhea, and now feels much better.

## 2018-05-11 MED ORDER — ACETAMINOPHEN 325 MG PO TABS
650.0000 mg | ORAL_TABLET | Freq: Four times a day (QID) | ORAL | Status: DC | PRN
  Administered 2018-05-11: 650 mg via ORAL
  Filled 2018-05-11 (×2): qty 2

## 2018-05-11 NOTE — Progress Notes (Signed)
Patient ID: Jasnoor Trussell, female   DOB: 07/10/1995, 22 y.o.   MRN: [id number] [place]) NOTE  Kynnedi Zweig is a 22 y.o. G1P0000 at [redacted]w[redacted]d by best clinical estimate who is admitted for [facility] and elevated LFT.   Fetal presentation is cephalic. Length of Stay:  5  Days  Subjective:  Patient reports the fetal movement as active. Patient reports uterine contraction  activity as none. Patient reports  vaginal bleeding as none. Patient describes fluid per vagina as None.  Vitals:  Blood pressure 140/90, pulse (!) 108, temperature 98.6 F (37 C), temperature source Oral, resp. rate 17, height 5\' 8"  (1.727 m), weight (!) 144.2 kg, last menstrual period 09/10/2017, SpO2 100 %. Physical Examination:  General appearance - alert, well appearing, and in no distress Heart - normal rate and regular rhythm Abdomen - soft, nontender, nondistended Fundal Height:  size equals dates Cervical Exam: Not evaluated.  Extremities: extremities normal, atraumatic, no cyanosis or edema and Homans sign is negative, no sign of DVT with DTRs 2+ bilaterally  Membranes:intact  Fetal Monitoring:  Baseline: 145 bpm, Variability: Good {> 6 bpm), Accelerations: Reactive and Decelerations: Absent  Labs:  Results for orders placed or performed during the hospital encounter of 05/06/18 (from the past 24 hour(s))  Type and screen [place]   Collection Time: 05/10/18  7:55 AM  Result Value Ref Range   ABO/RH(D) B POS    Antibody Screen NEG    Sample Expiration      05/13/2018 Performed at [facility], [address] [place], [place] [address]   Glucose, capillary   Collection Time: 05/10/18  2:38 PM  Result Value Ref Range   Glucose-Capillary 85 70 - 99 mg/dL  Urinalysis, Routine w reflex microscopic   Collection Time: 05/10/18  3:08 PM  Result Value Ref Range   Color, Urine YELLOW YELLOW   APPearance HAZY (A) CLEAR   Specific Gravity, Urine 1.016 1.005 -  1.030   pH 6.0 5.0 - 8.0   Glucose, UA NEGATIVE NEGATIVE mg/dL   Hgb urine dipstick NEGATIVE NEGATIVE   Bilirubin Urine NEGATIVE NEGATIVE   Ketones, ur 20 (A) NEGATIVE mg/dL   Protein, ur NEGATIVE NEGATIVE mg/dL   Nitrite NEGATIVE NEGATIVE   Leukocytes, UA NEGATIVE NEGATIVE      Medications:  Scheduled . aspirin EC  81 mg Oral Daily  . NIFEdipine  60 mg Oral Daily  . sodium chloride flush  3 mL Intravenous Q12H   I have reviewed the patient's current medications.  ASSESSMENT: Patient Active Problem List   Diagnosis Date Noted  . Chronic hypertension affecting pregnancy 05/06/2018  . Chronic hypertension complicating pregnancy, antepartum 04/24/2018  . Supervision of high-risk pregnancy 11/04/2017  . Ovarian cyst 11/04/2017  . PCOS (polycystic ovarian syndrome) 10/03/2017  . Obesity, Class III, BMI 40-49.9 (morbid obesity) ([place]) 04/03/2017  . Seasonal allergies 09/18/2016  . Anxiety and depression 09/18/2016  . Frequent headaches 09/18/2016    PLAN: Repeat labs tomorrow and reassess  Emeterio Reeve 05/11/2018,7:34 AM

## 2018-05-12 ENCOUNTER — Inpatient Hospital Stay (HOSPITAL_BASED_OUTPATIENT_CLINIC_OR_DEPARTMENT_OTHER): Payer: BLUE CROSS/BLUE SHIELD

## 2018-05-12 DIAGNOSIS — R7989 Other specified abnormal findings of blood chemistry: Secondary | ICD-10-CM | POA: Diagnosis present

## 2018-05-12 DIAGNOSIS — Z3A34 34 weeks gestation of pregnancy: Secondary | ICD-10-CM

## 2018-05-12 DIAGNOSIS — O10013 Pre-existing essential hypertension complicating pregnancy, third trimester: Secondary | ICD-10-CM

## 2018-05-12 DIAGNOSIS — O99213 Obesity complicating pregnancy, third trimester: Secondary | ICD-10-CM

## 2018-05-12 DIAGNOSIS — R945 Abnormal results of liver function studies: Secondary | ICD-10-CM | POA: Diagnosis present

## 2018-05-12 LAB — CBC
HCT: 34.3 % — ABNORMAL LOW (ref 36.0–46.0)
HEMOGLOBIN: 10.4 g/dL — AB (ref 12.0–15.0)
MCH: 22.4 pg — AB (ref 26.0–34.0)
MCHC: 30.3 g/dL (ref 30.0–36.0)
MCV: 73.9 fL — ABNORMAL LOW (ref 80.0–100.0)
NRBC: 0 % (ref 0.0–0.2)
PLATELETS: 238 10*3/uL (ref 150–400)
RBC: 4.64 MIL/uL (ref 3.87–5.11)
RDW: 17.9 % — ABNORMAL HIGH (ref 11.5–15.5)
WBC: 10.8 10*3/uL — ABNORMAL HIGH (ref 4.0–10.5)

## 2018-05-12 LAB — COMPREHENSIVE METABOLIC PANEL
ALBUMIN: 2.5 g/dL — AB (ref 3.5–5.0)
ALK PHOS: 242 U/L — AB (ref 38–126)
ALT: 184 U/L — ABNORMAL HIGH (ref 0–44)
ANION GAP: 8 (ref 5–15)
AST: 68 U/L — ABNORMAL HIGH (ref 15–41)
BUN: 5 mg/dL — ABNORMAL LOW (ref 6–20)
CALCIUM: 8.3 mg/dL — AB (ref 8.9–10.3)
CO2: 20 mmol/L — ABNORMAL LOW (ref 22–32)
Chloride: 107 mmol/L (ref 98–111)
Creatinine, Ser: 0.46 mg/dL (ref 0.44–1.00)
GFR calc Af Amer: >60 mL/min (ref >60)
GFR calc non Af Amer: >60 mL/min (ref >60)
GLUCOSE: 130 mg/dL — AB (ref 70–99)
Potassium: 3.7 mmol/L (ref 3.5–5.1)
SODIUM: 135 mmol/L (ref 135–145)
Total Bilirubin: 0.2 mg/dL — ABNORMAL LOW (ref 0.3–1.2)
Total Protein: 6.7 g/dL (ref 6.5–8.1)

## 2018-05-12 MED ORDER — NIFEDIPINE ER 60 MG PO TB24: 60.0000 mg | ORAL_TABLET | Freq: Every day | ORAL | 0 refills | Status: DC

## 2018-05-12 NOTE — Progress Notes (Signed)
[facility] OB/[facility]N Attending Note  Hepatic Function Latest Ref Rng & Units 05/12/2018 05/09/2018 05/07/2018  Total Protein 6.5 - 8.1 g/dL 6.7 6.6 7.9  Albumin 3.5 - 5.0 g/dL 2.5(L) 2.5(L) 2.8(L)  AST 15 - 41 U/L 68(H) 78(H) 102(H)  ALT 0 - 44 U/L 184(H) 217(H) 170(H)  Alk Phosphatase 38 - 126 U/L 242(H) 233(H) 278(H)  Total Bilirubin 0.3 - 1.2 mg/dL 0.2(L) 0.4 0.8   CBC Latest Ref Rng & Units 05/12/2018 05/07/2018 05/06/2018  WBC 4.0 - 10.5 K/uL 10.8(H) 10.2 10.2  Hemoglobin 12.0 - 15.0 g/dL 10.4(L) 11.0(L) 11.3(L)  Hematocrit 36.0 - 46.0 % 34.3(L) 35.7(L) 37.1  Platelets 150 - 400 K/uL 238 257 251   Patient Vitals for the past 24 hrs:  BP Temp Temp src Pulse Resp SpO2 Weight  05/12/18 1202 (!) 142/90 98.8 F (37.1 C) Oral (!) 108 18 99 % -  05/12/18 1009 (!) 132/93 - - (!) 115 - - -  05/12/18 0802 124/80 98.6 F (37 C) Oral (!) 112 16 100 % -  05/12/18 0525 130/72 97.9 F (36.6 C) Oral (!) 113 20 100 % -  05/12/18 0500 - - - - - - (!) 145.2 kg  05/11/18 2302 106/76 97.9 F (36.6 C) Oral (!) 107 20 99 % -  05/11/18 1946 138/86 97.9 F (36.6 C) Oral (!) 119 20 97 % -  05/11/18 1544 127/74 98.5 F (36.9 C) Oral (!) 117 18 100 % -    Discussed results with Dr. Shankar (MFM).  He is reassured by the decreasing LFTs, absence of low platelets or other signs/symptoms of HELLP or severe preeclampsia. He feels she can be managed outpatient, with a twice a week visits (BPPs on Mondays at MFM and OB visit/NST/labs at [facility]).  MFM and [place] called, appointments made for patient.  Patient was given strict preeclampsia precautions, will also check BP at home and let us know of any severe range BP readings or symptoms.  The goal is to try to get her to 37 weeks, if everything remains stable.  Patient and her husband are agreeable to this plan.  BPP ordered today, will follow up results and manage accordingly.  If reassuring, and there are no other concerning signs/symptoms, will discharge  later today as planned with the aforementioned follow up.   UGONNA  ANYANWU, MD, FACOG Obstetrician & Gynecologist, [facility], [facility]       

## 2018-05-12 NOTE — Progress Notes (Signed)
Patient ID: Latoya Mora, female   DOB: 1996/02/10, 22 y.o.   MRN: [id number] [place]) NOTE  Latoya Mora is a 22 y.o. G1P0000 at [redacted]w[redacted]d by best clinical estimate who is admitted for [facility] with elevated LFT's.   Fetal presentation is cephalic. Length of Stay:  6  Days  Subjective: Had headache last pm--resolved after sleep, tylenol did not help Patient reports the fetal movement as active. Patient reports uterine contraction  activity as none. Patient reports  vaginal bleeding as none. Patient describes fluid per vagina as None.  Vitals:  Blood pressure 124/80, pulse (!) 112, temperature 98.6 F (37 C), temperature source Oral, resp. rate 16, height 5\' 8"  (1.727 m), weight (!) 145.2 kg, last menstrual period 09/10/2017, SpO2 100 %. Physical Examination:  General appearance - alert, well appearing, and in no distress Chest - normal effort Abdomen - gravid, non-tender Fundal Height:  size equals dates Extremities: Homans sign is negative, no sign of DVT  Membranes:intact  Fetal Monitoring:  Baseline: 140 bpm, Variability: Good {> 6 bpm), Accelerations: Reactive and Decelerations: Absent  Labs:  Results for orders placed or performed during the hospital encounter of 05/06/18 (from the past 24 hour(s))  Comprehensive metabolic panel   Collection Time: 05/12/18  5:19 AM  Result Value Ref Range   Sodium 135 135 - 145 mmol/L   Potassium 3.7 3.5 - 5.1 mmol/L   Chloride 107 98 - 111 mmol/L   CO2 20 (L) 22 - 32 mmol/L   Glucose, Bld 130 (H) 70 - 99 mg/dL   BUN 5 (L) 6 - 20 mg/dL   Creatinine, Ser 0.46 0.44 - 1.00 mg/dL   Calcium 8.3 (L) 8.9 - 10.3 mg/dL   Total Protein 6.7 6.5 - 8.1 g/dL   Albumin 2.5 (L) 3.5 - 5.0 g/dL   AST 68 (H) 15 - 41 U/L   ALT 184 (H) 0 - 44 U/L   Alkaline Phosphatase 242 (H) 38 - 126 U/L   Total Bilirubin 0.2 (L) 0.3 - 1.2 mg/dL   GFR calc non Af Amer >60 >60 mL/min   GFR calc Af Amer >60 >60 mL/min   Anion gap 8 5 - 15  CBC    Collection Time: 05/12/18  5:19 AM  Result Value Ref Range   WBC 10.8 (H) 4.0 - 10.5 K/uL   RBC 4.64 3.87 - 5.11 MIL/uL   Hemoglobin 10.4 (L) 12.0 - 15.0 g/dL   HCT 34.3 (L) 36.0 - 46.0 %   MCV 73.9 (L) 80.0 - 100.0 fL   MCH 22.4 (L) 26.0 - 34.0 pg   MCHC 30.3 30.0 - 36.0 g/dL   RDW 17.9 (H) 11.5 - 15.5 %   Platelets 238 150 - 400 K/uL   nRBC 0.0 0.0 - 0.2 %   .  Medications:  Scheduled . aspirin EC  81 mg Oral Daily  . NIFEdipine  60 mg Oral Daily  . sodium chloride flush  3 mL Intravenous Q12H   I have reviewed the patient's current medications.  ASSESSMENT: Active Problems:   Chronic hypertension affecting pregnancy Elevated LFT's  PLAN: Have changed her BP meds to procardia XL--excellent BP control LFT's trend down, but have not normalized New Headache--now resolved FHR reassuring Will discuss POC with MFM and develop plan  Donnamae Jude, MD 05/12/2018,9:30 AM

## 2018-05-12 NOTE — Discharge Instructions (Signed)
Hypertension During Pregnancy °Hypertension, commonly called high blood pressure, is when the force of blood pumping through your arteries is too strong. Arteries are blood vessels that carry blood from the heart throughout the body. Hypertension during pregnancy can cause problems for you and your baby. Your baby may be born early (prematurely) or may not weigh as much as he or she should at birth. Very bad cases of hypertension during pregnancy can be life-threatening. °Different types of hypertension can occur during pregnancy. These include: °· Chronic hypertension. This happens when: °? You have hypertension before pregnancy and it continues during pregnancy. °? You develop hypertension before you are [redacted] weeks pregnant, and it continues during pregnancy. °· Gestational hypertension. This is hypertension that develops after the 20th week of pregnancy. °· Preeclampsia, also called toxemia of pregnancy. This is a very serious type of hypertension that develops only during pregnancy. It affects the whole body, and it can be very dangerous for you and your baby. ° °Gestational hypertension and preeclampsia usually go away within 6 weeks after your baby is born. Women who have hypertension during pregnancy have a greater chance of developing hypertension later in life or during future pregnancies. °What are the causes? °The exact cause of hypertension is not known. °What increases the risk? °There are certain factors that make it more likely for you to develop hypertension during pregnancy. These include: °· Having hypertension during a previous pregnancy or prior to pregnancy. °· Being overweight. °· Being older than age 40. °· Being pregnant for the first time or being pregnant with more than one baby. °· Becoming pregnant using fertilization methods such as IVF (in vitro fertilization). °· Having diabetes, kidney problems, or systemic lupus erythematosus. °· Having a family history of hypertension. ° °What are the  signs or symptoms? °Chronic hypertension and gestational hypertension rarely cause symptoms. Preeclampsia causes symptoms, which may include: °· Increased protein in your urine. Your health care provider will check for this at every visit before you give birth (prenatal visit). °· Severe headaches. °· Sudden weight gain. °· Swelling of the hands, face, legs, and feet. °· Nausea and vomiting. °· Vision problems, such as blurred or double vision. °· Numbness in the face, arms, legs, and feet. °· Dizziness. °· Slurred speech. °· Sensitivity to bright lights. °· Abdominal pain. °· Convulsions. ° °How is this diagnosed? °You may be diagnosed with hypertension during a routine prenatal exam. At each prenatal visit, you may: °· Have a urine test to check for high amounts of protein in your urine. °· Have your blood pressure checked. A blood pressure reading is recorded as two numbers, such as "120 over 80" (or 120/80). The first ("top") number is called the systolic pressure. It is a measure of the pressure in your arteries when your heart beats. The second ("bottom") number is called the diastolic pressure. It is a measure of the pressure in your arteries as your heart relaxes between beats. Blood pressure is measured in a unit called mm Hg. A normal blood pressure reading is: °? Systolic: below 120. °? Diastolic: below 80. ° °The type of hypertension that you are diagnosed with depends on your test results and when your symptoms developed. °· Chronic hypertension is usually diagnosed before 20 weeks of pregnancy. °· Gestational hypertension is usually diagnosed after 20 weeks of pregnancy. °· Hypertension with high amounts of protein in the urine is diagnosed as preeclampsia. °· Blood pressure measurements that stay above 160 systolic, or above 110 diastolic, are   signs of severe preeclampsia. ° °How is this treated? °Treatment for hypertension during pregnancy varies depending on the type of hypertension you have and how  serious it is. °· If you take medicines called ACE inhibitors to treat chronic hypertension, you may need to switch medicines. ACE inhibitors should not be taken during pregnancy. °· If you have gestational hypertension, you may need to take blood pressure medicine. °· If you are at risk for preeclampsia, your health care provider may recommend that you take a low-dose aspirin every day to prevent high blood pressure during your pregnancy. °· If you have severe preeclampsia, you may need to be hospitalized so you and your baby can be monitored closely. You may also need to take medicine (magnesium sulfate) to prevent seizures and to lower blood pressure. This medicine may be given as an injection or through an IV tube. °· In some cases, if your condition gets worse, you may need to deliver your baby early. ° °Follow these instructions at home: °Eating and drinking °· Drink enough fluid to keep your urine clear or pale yellow. °· Eat a healthy diet that is low in salt (sodium). Do not add salt to your food. Check food labels to see how much sodium a food or beverage contains. °Lifestyle °· Do not use any products that contain nicotine or tobacco, such as cigarettes and e-cigarettes. If you need help quitting, ask your health care provider. °· Do not use alcohol. °· Avoid caffeine. °· Avoid stress as much as possible. Rest and get plenty of sleep. °General instructions °· Take over-the-counter and prescription medicines only as told by your health care provider. °· While lying down, lie on your left side. This keeps pressure off your baby. °· While sitting or lying down, raise (elevate) your feet. Try putting some pillows under your lower legs. °· Exercise regularly. Ask your health care provider what kinds of exercise are best for you. °· Keep all prenatal and follow-up visits as told by your health care provider. This is important. °Contact a health care provider if: °· You have symptoms that your health care  provider told you may require more treatment or monitoring, such as: °? Fever. °? Vomiting. °? Headache. °Get help right away if: °· You have severe abdominal pain or vomiting that does not get better with treatment. °· You suddenly develop swelling in your hands, ankles, or face. °· You gain 4 lbs (1.8 kg) or more in 1 week. °· You develop vaginal bleeding, or you have blood in your urine. °· You do not feel your baby moving as much as usual. °· You have blurred or double vision. °· You have muscle twitching or sudden tightening (spasms). °· You have shortness of breath. °· Your lips or fingernails turn blue. °This information is not intended to replace advice given to you by your health care provider. Make sure you discuss any questions you have with your health care provider. °Document Released: 02/27/2011 Document Revised: 12/30/2015 Document Reviewed: 11/25/2015 °[employer] Interactive Patient Education © 2018 [employer] ° °

## 2018-05-12 NOTE — Plan of Care (Signed)
Pt discharged home with Pre-eclampsia s/s

## 2018-05-12 NOTE — Discharge Summary (Signed)
Antenatal Physician Discharge Summary  Patient ID: Latoya Mora MRN: [id number] DOB/AGE: [birth year] 22 y.o.  Admit date: 05/06/2018 Discharge date: 05/12/2018  Admission Diagnoses:  Principal Problem:   Abnormal liver function tests Active Problems:   Chronic hypertension complicating pregnancy, antepartum  Discharge Diagnoses: The same  Prenatal Procedures: NST, Preeclampsia evaluation and ultrasound  Consults: Neonatology, Maternal Fetal Medicine  Hospital Course:  This is a 22 y.o. G1P0000 with IUP at [redacted]w[redacted]d admitted on 05/06/2018 for abnormal LFTs in the setting of chronic hypertension. No other signs/symptoms of preeclampsia.  Dr. Donalee Citrin (MFM) was consulted, he recommended close observation and evaluation for preeclampsia and serial labs.  Patient received betamethasone x 2 doses and was seen by Neonatology during her stay.  Her LFTs increased initially and several labs were done, did not show a clear etiology of the abnormal LFTs.  Her antihypertensive was changed to Procardia, concerned about hepatotoxicity of Labetalol.  Her BP remained stable, no severe range BP. The fetal heart rate monitoring remained reassuring, she had BPP 10/10 on admission, and she had no signs/symptoms of progressing preterm labor or other maternal-fetal concerns.  Repeat LFTs on 05/12/18 showed decreasing LFTs.   Discussed results with Dr. Donalee Citrin (MFM).  He was reassured by the decreasing LFTs, absence of low platelets or other signs/symptoms of HELLP or severe preeclampsia. He feels she can be managed outpatient, with a twice a week visits (BPPs on Mondays at [place] and OB visit/NST/labs at [place]).  MFM and [place] called, appointments made for patient.  Patient was given strict preeclampsia precautions, will also check BP at home and let us know of any severe range BP readings or symptoms.  The goal is to try to get her to 37 weeks, if everything remains stable.  Patient and her husband are agreeable to this plan.  She got a BPP that was 10/10 today.  Patient was discharged to home in stable condition.   Discharge Exam: Temp:  [97.9 F (36.6 C)-98.8 F (37.1 C)] 98.8 F (37.1 C) (11/18 1202) Pulse Rate:  [107-119] 108 (11/18 1202) Resp:  [16-20] 18 (11/18 1202) BP: (106-142)/(72-93) 142/90 (11/18 1202) SpO2:  [97 %-100 %] 99 % (11/18 1202) Weight:  [145.2 kg] 145.2 kg (11/18 0500) Physical Examination: CONSTITUTIONAL: Well-developed, well-nourished female in no acute distress.  HENT:  Normocephalic, atraumatic, External right and left ear normal. Oropharynx is clear and moist EYES: Conjunctivae and EOM are normal. Pupils are equal, round, and reactive to light. No scleral icterus.  NECK: Normal range of motion, supple, no masses SKIN: Skin is warm and dry. No rash noted. Not diaphoretic. No erythema. No pallor. [place]: Alert and oriented to person, place, and time. Normal reflexes, muscle tone coordination. No cranial nerve deficit noted. PSYCHIATRIC: Normal mood and affect. Normal behavior. Normal judgment and thought content. CARDIOVASCULAR: Normal heart rate noted, regular rhythm RESPIRATORY: Effort and breath sounds normal, no problems with respiration noted MUSCULOSKELETAL: Normal range of motion. No edema and no tenderness. 2+ distal pulses. ABDOMEN: Soft, nontender, nondistended, gravid. CERVIX: Deferred  Fetal monitoring: FHR:145 bpm, Variability: moderate, Accelerations: Present, Decelerations: Absent  Uterine activity: No contractions   Significant Diagnostic Studies:  Results for orders placed or performed during the hospital encounter of 05/06/18 (from the past 168 hour(s))  Urine rapid drug screen (hosp performed)not at [facility]   Collection Time: 05/06/18  4:21 PM  Result Value Ref Range   Opiates NONE DETECTED NONE DETECTED   Cocaine NONE DETECTED NONE DETECTED   Benzodiazepines NONE DETECTED NONE  DETECTED   Amphetamines NONE DETECTED NONE DETECTED   Tetrahydrocannabinol  NONE DETECTED NONE DETECTED   Barbiturates NONE DETECTED NONE DETECTED  Culture, beta strep (group b only)   Collection Time: 05/06/18  4:49 PM  Result Value Ref Range   Specimen Description      VAGINAL/RECTAL Performed at [facility], [address], [place], [place] [address]    Special Requests      NONE Performed at [facility], [address], [place], [place] [address]    Culture      NO GROUP B STREP (S.AGALACTIAE) ISOLATED Performed at [facility], [place] [address] [place], [place] [address]    Report Status 05/08/2018 FINAL   CBC   Collection Time: 05/06/18  5:25 PM  Result Value Ref Range   WBC 10.2 4.0 - 10.5 K/uL   RBC 5.01 3.87 - 5.11 MIL/uL   Hemoglobin 11.3 (L) 12.0 - 15.0 g/dL   HCT 37.1 36.0 - 46.0 %   MCV 74.1 (L) 80.0 - 100.0 fL   MCH 22.6 (L) 26.0 - 34.0 pg   MCHC 30.5 30.0 - 36.0 g/dL   RDW 17.4 (H) 11.5 - 15.5 %   Platelets 251 150 - 400 K/uL   nRBC 0.0 0.0 - 0.2 %  Comprehensive metabolic panel   Collection Time: 05/06/18  5:25 PM  Result Value Ref Range   Sodium 134 (L) 135 - 145 mmol/L   Potassium 3.7 3.5 - 5.1 mmol/L   Chloride 104 98 - 111 mmol/L   CO2 20 (L) 22 - 32 mmol/L   Glucose, Bld 89 70 - 99 mg/dL   BUN <5 (L) 6 - 20 mg/dL   Creatinine, Ser 0.42 (L) 0.44 - 1.00 mg/dL   Calcium 9.0 8.9 - 10.3 mg/dL   Total Protein 6.4 (L) 6.5 - 8.1 g/dL   Albumin 2.6 (L) 3.5 - 5.0 g/dL   AST 70 (H) 15 - 41 U/L   ALT 123 (H) 0 - 44 U/L   Alkaline Phosphatase 279 (H) 38 - 126 U/L   Total Bilirubin 0.3 0.3 - 1.2 mg/dL   GFR calc non Af Amer >60 >60 mL/min   GFR calc Af Amer >60 >60 mL/min   Anion gap 10 5 - 15  Type and screen   Collection Time: 05/06/18  5:25 PM  Result Value Ref Range   ABO/RH(D) B POS    Antibody Screen NEG    Sample Expiration      05/09/2018 Performed at [facility], [address], [place], [place] [address]   ABO/Rh   Collection Time: 05/06/18  5:25 PM  Result Value Ref Range   ABO/RH(D)      B  POS Performed at [facility], [place], [place] [address]   CBC   Collection Time: 05/07/18  5:52 AM  Result Value Ref Range   WBC 10.2 4.0 - 10.5 K/uL   RBC 4.84 3.87 - 5.11 MIL/uL   Hemoglobin 11.0 (L) 12.0 - 15.0 g/dL   HCT 35.7 (L) 36.0 - 46.0 %   MCV 73.8 (L) 80.0 - 100.0 fL   MCH 22.7 (L) 26.0 - 34.0 pg   MCHC 30.8 30.0 - 36.0 g/dL   RDW 17.6 (H) 11.5 - 15.5 %   Platelets 257 150 - 400 K/uL   nRBC 0.0 0.0 - 0.2 %  Comprehensive metabolic panel   Collection Time: 05/07/18  5:52 AM  Result Value Ref Range   Sodium  133 (L) 135 - 145 mmol/L   Potassium 3.7 3.5 - 5.1 mmol/L   Chloride 102 98 - 111 mmol/L   CO2 18 (L) 22 - 32 mmol/L   Glucose, Bld 112 (H) 70 - 99 mg/dL   BUN <5 (L) 6 - 20 mg/dL   Creatinine, Ser 0.44 0.44 - 1.00 mg/dL   Calcium 8.9 8.9 - 10.3 mg/dL   Total Protein 7.9 6.5 - 8.1 g/dL   Albumin 2.8 (L) 3.5 - 5.0 g/dL   AST 102 (H) 15 - 41 U/L   ALT 170 (H) 0 - 44 U/L   Alkaline Phosphatase 278 (H) 38 - 126 U/L   Total Bilirubin 0.8 0.3 - 1.2 mg/dL   GFR calc non Af Amer >60 >60 mL/min   GFR calc Af Amer >60 >60 mL/min   Anion gap 13 5 - 15  Wet prep, genital   Collection Time: 05/07/18 11:08 AM  Result Value Ref Range   Yeast Wet Prep HPF POC PRESENT (A) NONE SEEN   Trich, Wet Prep NONE SEEN NONE SEEN   Clue Cells Wet Prep HPF POC NONE SEEN NONE SEEN   WBC, Wet Prep HPF POC FEW (A) NONE SEEN   Sperm NONE SEEN   Creatinine clearance, urine, 24 hour   Collection Time: 05/07/18  4:45 PM  Result Value Ref Range   Urine Total Volume-CRCL 1,675 mL   Collection Interval-CRCL 24 hours   Creatinine, Urine 99.32 mg/dL   Creatinine, 24H Ur 1,664 600 - 1,800 mg/day   Creatinine Clearance 263 (H) 75 - 115 mL/min  Protein, urine, 24 hour   Collection Time: 05/07/18  4:45 PM  Result Value Ref Range   Urine Total Volume-UPROT 1,675 mL   Collection Interval-UPROT 24 hours   Protein, Urine 14 mg/dL   Protein, 24H Urine 235 (H) 50 - 100 mg/day   Comprehensive metabolic panel   Collection Time: 05/09/18  5:28 AM  Result Value Ref Range   Sodium 137 135 - 145 mmol/L   Potassium 3.6 3.5 - 5.1 mmol/L   Chloride 109 98 - 111 mmol/L   CO2 21 (L) 22 - 32 mmol/L   Glucose, Bld 110 (H) 70 - 99 mg/dL   BUN <5 (L) 6 - 20 mg/dL   Creatinine, Ser 0.37 (L) 0.44 - 1.00 mg/dL   Calcium 8.2 (L) 8.9 - 10.3 mg/dL   Total Protein 6.6 6.5 - 8.1 g/dL   Albumin 2.5 (L) 3.5 - 5.0 g/dL   AST 78 (H) 15 - 41 U/L   ALT 217 (H) 0 - 44 U/L   Alkaline Phosphatase 233 (H) 38 - 126 U/L   Total Bilirubin 0.4 0.3 - 1.2 mg/dL   GFR calc non Af Amer >60 >60 mL/min   GFR calc Af Amer >60 >60 mL/min   Anion gap 7 5 - 15  Hepatitis C antibody   Collection Time: 05/09/18  5:28 AM  Result Value Ref Range   HCV Ab <0.1 0.0 - 0.9 s/co ratio  Bile acids, total   Collection Time: 05/09/18  9:57 AM  Result Value Ref Range   Bile Acids Total 7.5 0.0 - 10.0 umol/L  Type and screen [place]   Collection Time: 05/10/18  7:55 AM  Result Value Ref Range   ABO/RH(D) B POS    Antibody Screen NEG    Sample Expiration      05/13/2018 Performed at [facility], [address] [place], [place] [address]  Glucose, capillary   Collection Time: 05/10/18  2:38 PM  Result Value Ref Range   Glucose-Capillary 85 70 - 99 mg/dL  Urinalysis, Routine w reflex microscopic   Collection Time: 05/10/18  3:08 PM  Result Value Ref Range   Color, Urine YELLOW YELLOW   APPearance HAZY (A) CLEAR   Specific Gravity, Urine 1.016 1.005 - 1.030   pH 6.0 5.0 - 8.0   Glucose, UA NEGATIVE NEGATIVE mg/dL   Hgb urine dipstick NEGATIVE NEGATIVE   Bilirubin Urine NEGATIVE NEGATIVE   Ketones, ur 20 (A) NEGATIVE mg/dL   Protein, ur NEGATIVE NEGATIVE mg/dL   Nitrite NEGATIVE NEGATIVE   Leukocytes, UA NEGATIVE NEGATIVE  Comprehensive metabolic panel   Collection Time: 05/12/18  5:19 AM  Result Value Ref Range   Sodium 135 135 - 145 mmol/L   Potassium 3.7 3.5 -  5.1 mmol/L   Chloride 107 98 - 111 mmol/L   CO2 20 (L) 22 - 32 mmol/L   Glucose, Bld 130 (H) 70 - 99 mg/dL   BUN 5 (L) 6 - 20 mg/dL   Creatinine, Ser 0.46 0.44 - 1.00 mg/dL   Calcium 8.3 (L) 8.9 - 10.3 mg/dL   Total Protein 6.7 6.5 - 8.1 g/dL   Albumin 2.5 (L) 3.5 - 5.0 g/dL   AST 68 (H) 15 - 41 U/L   ALT 184 (H) 0 - 44 U/L   Alkaline Phosphatase 242 (H) 38 - 126 U/L   Total Bilirubin 0.2 (L) 0.3 - 1.2 mg/dL   GFR calc non Af Amer >60 >60 mL/min   GFR calc Af Amer >60 >60 mL/min   Anion gap 8 5 - 15  CBC   Collection Time: 05/12/18  5:19 AM  Result Value Ref Range   WBC 10.8 (H) 4.0 - 10.5 K/uL   RBC 4.64 3.87 - 5.11 MIL/uL   Hemoglobin 10.4 (L) 12.0 - 15.0 g/dL   HCT 34.3 (L) 36.0 - 46.0 %   MCV 73.9 (L) 80.0 - 100.0 fL   MCH 22.4 (L) 26.0 - 34.0 pg   MCHC 30.3 30.0 - 36.0 g/dL   RDW 17.9 (H) 11.5 - 15.5 %   Platelets 238 150 - 400 K/uL   nRBC 0.0 0.0 - 0.2 %  Results for orders placed or performed in visit on 05/05/18 (from the past 168 hour(s))  Comprehensive metabolic panel   Collection Time: 05/05/18  4:13 PM  Result Value Ref Range   Glucose 72 65 - 99 mg/dL   BUN 3 (L) 6 - 20 mg/dL   Creatinine, Ser 0.44 (L) 0.57 - 1.00 mg/dL   GFR calc non Af Amer 142 >59 mL/min/1.73   GFR calc Af Amer 163 >59 mL/min/1.73   BUN/Creatinine Ratio 7 (L) 9 - 23   Sodium 137 134 - 144 mmol/L   Potassium 4.2 3.5 - 5.2 mmol/L   Chloride 103 96 - 106 mmol/L   CO2 17 (L) 20 - 29 mmol/L   Calcium 9.8 8.7 - 10.2 mg/dL   Total Protein 7.0 6.0 - 8.5 g/dL   Albumin 3.5 3.5 - 5.5 g/dL   Globulin, Total 3.5 1.5 - 4.5 g/dL   Albumin/Globulin Ratio 1.0 (L) 1.2 - 2.2   Bilirubin Total 0.2 0.0 - 1.2 mg/dL   Alkaline Phosphatase 345 (H) 39 - 117 IU/L   AST 57 (H) 0 - 40 IU/L   ALT 113 (H) 0 - 32 IU/L  CBC   Collection Time: 05/05/18  4:13 PM  Result Value Ref Range   WBC 12.1 (H) 3.4 - 10.8 x10E3/uL   RBC 5.24 3.77 - 5.28 x10E6/uL   Hemoglobin 11.1 11.1 - 15.9 g/dL   Hematocrit 36.7 34.0  - 46.6 %   MCV 70 (L) 79 - 97 fL   MCH 21.2 (L) 26.6 - 33.0 pg   MCHC 30.2 (L) 31.5 - 35.7 g/dL   RDW 17.1 (H) 12.3 - 15.4 %   Platelets 278 150 - 450 x10E3/uL  Protein / creatinine ratio, urine   Collection Time: 05/05/18  4:50 PM  Result Value Ref Range   Creatinine, Urine 77.2 Not Estab. mg/dL   Protein, Ur 15.1 Not Estab. mg/dL   Protein/Creat Ratio 196 0 - 200 mg/g creat   [place] Mfm Fetal Bpp Wo Non Stress  Result Date: 05/12/2018 ----------------------------------------------------------------------  OBSTETRICS REPORT                       (Signed Final 05/12/2018 03:09 pm) ---------------------------------------------------------------------- Patient Info  ID #:       [id number]                          D.O.B.:  11/22/1995 (22 yrs)  Name:       Azalya Marek                    Visit Date: 05/12/2018 02:11 pm ---------------------------------------------------------------------- Performed By  Performed By:     Jeanene Erb BS,      Ref. Address:     [address]                    [place], [place]  Attending:        Tama High MD        Secondary Phy.:   3rd Nursing- HR                                                             OB  3rd Floor  Referred By:      Sallyanne Havers               Location:         [facility] MD ---------------------------------------------------------------------- Orders   #  Description                          Code         Ordered By   1  [place] MFM FETAL BPP WO NON              76819.01     Victorhugo Preis      STRESS  ----------------------------------------------------------------------   #  Order #                    Accession #                 Episode #   1  [id number]                   [id number]                  [id number]  ---------------------------------------------------------------------- Indications   Obesity complicating pregnancy, third          O99.213   trimester   Hypertension - Chronic/Pre-existing            O10.019   (labetalol)   [redacted] weeks gestation of pregnancy                Z3A.34  ---------------------------------------------------------------------- Fetal Evaluation  Num Of Fetuses:         1  Fetal Heart Rate(bpm):  159  Cardiac Activity:       Observed  Presentation:           Cephalic  Amniotic Fluid  AFI FV:      Subjectively low-normal  AFI Sum(cm)     %Tile       Largest Pocket(cm)  8.75            11          4.47  RUQ(cm)                     LUQ(cm)        LLQ(cm)  4.47                        2.68           1.6 ---------------------------------------------------------------------- Biophysical Evaluation  Amniotic F.V:   Pocket => 2 cm two         F. Tone:        Observed                  planes  F. Movement:    Observed                   Score:          8/8  F. Breathing:   Observed ---------------------------------------------------------------------- OB History  Gravidity:    1         Term:   0        Prem:   0        SAB:   0  TOP:  0       Ectopic:  0        Living: 0 ---------------------------------------------------------------------- Gestational Age  LMP:           34w 6d        Date:  09/10/17                 EDD:   06/17/18  Best:          34w 6d     Det. By:  LMP  (09/10/17)          EDD:   06/17/18 ---------------------------------------------------------------------- Impression  Amniotic fluid is normal and good fetal activity is seen.  Antenatal testing is reassuring. BPP 8/8.  Liver enzymes have improved and hypertension is well-  controlled with procardia. ---------------------------------------------------------------------- Recommendations  BPP on 05/15/18. ----------------------------------------------------------------------                   Tama High, MD Electronically Signed Final Report   05/12/2018 03:09 pm ----------------------------------------------------------------------  [place] Mfm Fetal Bpp Wo Non Stress  Result Date: 05/07/2018 ----------------------------------------------------------------------  OBSTETRICS REPORT                       (Signed Final 05/07/2018 04:53 pm) ---------------------------------------------------------------------- Patient Info  ID #:       [id number]                          D.O.B.:  1995-11-19 (22 yrs)  Name:       Latoiya Twichell                    Visit Date: 05/07/2018 03:35 pm ---------------------------------------------------------------------- Performed By  Performed By:     Enriqueta Shutter           Ref. Address:     [address] [place], [place]  Attending:        Tama High MD        Location:         [facility]  Referred By:      Osborne Oman MD ---------------------------------------------------------------------- Orders   #  Description  Code         Ordered By   1  [place] MFM FETAL BPP WO NON              [id number]     Verita Schneiders      STRESS  ----------------------------------------------------------------------   #  Order #                    Accession #                 Episode #   1  [id number]                  [id number]                  [id number]  ---------------------------------------------------------------------- Indications   Obesity complicating pregnancy, third          O99.213   trimester   Hypertension - Chronic/Pre-existing            O10.019   (labetalol)   [redacted] weeks gestation of pregnancy                Z3A.34  ---------------------------------------------------------------------- Fetal Evaluation  Num Of Fetuses:          1  Fetal Heart Rate(bpm):  142  Cardiac Activity:       Observed  Presentation:           Cephalic  Amniotic Fluid  AFI FV:      Within normal limits  AFI Sum(cm)     %Tile       Largest Pocket(cm)  17.23           63          5.22  RUQ(cm)       RLQ(cm)       LUQ(cm)        LLQ(cm)  3.98          5.22          4.32           3.71 ---------------------------------------------------------------------- Biophysical Evaluation  Amniotic F.V:   Pocket => 2 cm two         F. Tone:        Observed                  planes  F. Movement:    Observed                   Score:          8/8  F. Breathing:   Observed ---------------------------------------------------------------------- OB History  Gravidity:    1         Term:   0        Prem:   0        SAB:   0  TOP:          0       Ectopic:  0        Living: 0 ---------------------------------------------------------------------- Gestational Age  LMP:           34w 1d        Date:  09/10/17                 EDD:   06/17/18  Best:          34w 1d     Det. By:  LMP  (09/10/17)  EDD:   06/17/18 ---------------------------------------------------------------------- Anatomy  Stomach:               Visualized             Bladder:                Visualized ---------------------------------------------------------------------- Impression  Amniotic fluid is normal and good fetal activity is seen.  Antenatal testing is reassuring. BPP 8/8. ----------------------------------------------------------------------                  Tama High, MD Electronically Signed Final Report   05/07/2018 04:53 pm ----------------------------------------------------------------------  [place] Mfm Fetal Bpp Wo Non Stress  Result Date: 05/07/2018 ----------------------------------------------------------------------  OBSTETRICS REPORT                    (Corrected Final 05/07/2018 01:58 pm) ---------------------------------------------------------------------- Patient Info  ID #:        [id number]                          D.O.B.:  10-28-1995 (22 yrs)  Name:       Ayleen Mote                    Visit Date: 05/01/2018 03:14 pm ---------------------------------------------------------------------- Performed By  Performed By:     Berlinda Last          Ref. Address:     [address]                    [place], [place]  Attending:        Lajoyce Lauber      Location:         [facility]                    MD  Referred By:      Osborne Oman MD ---------------------------------------------------------------------- Orders   #  Description                          Code         Ordered By   1  [place] MFM FETAL BPP WO NON  05397.67     Verita Schneiders      STRESS   2  [place] MFM OB DETAIL +14 WK              76811.01     Letrell Attwood  ----------------------------------------------------------------------   #  Order #                    Accession #                 Episode #   1  [id number]                  [id number]                  [id number]   2  [id number]                  [id number]                  [id number]  ---------------------------------------------------------------------- Indications   Encounter for antenatal screening for          Z36.3   malformations   [redacted] weeks gestation of pregnancy                J4H.70   Obesity complicating pregnancy, third          O99.213   trimester   Hypertension - Chronic/Pre-existing            O10.019   (labetalol)  ---------------------------------------------------------------------- Fetal Evaluation  Num Of Fetuses:         1  Fetal Heart Rate(bpm):  138  Cardiac Activity:       Observed  Presentation:           Cephalic  Placenta:               Posterior  P. Cord Insertion:      Not well visualized  Amniotic Fluid  AFI FV:      Within  normal limits  AFI Sum(cm)     %Tile       Largest Pocket(cm)  12.1            34          5.06  RUQ(cm)       RLQ(cm)       LUQ(cm)        LLQ(cm)  5.06          2.1           1.65           3.29 ---------------------------------------------------------------------- Biophysical Evaluation  Amniotic F.V:   Within normal limits       F. Tone:        Observed  F. Movement:    Observed                   Score:          8/8  F. Breathing:   Observed ---------------------------------------------------------------------- Biometry  BPD:      85.9  mm     G. Age:  34w 5d         82  %    CI:        76.26   %    70 - 86  FL/HC:      20.7   %    19.9 - 21.5  HC:      311.7  mm     G. Age:  34w 6d         55  %    HC/AC:      1.01        0.96 - 1.11  AC:      309.7  mm     G. Age:  34w 6d         90  %    FL/BPD:     75.1   %    71 - 87  FL:       64.5  mm     G. Age:  33w 2d         39  %    FL/AC:      20.8   %    20 - 24  HUM:      59.7  mm     G. Age:  34w 5d         85  %  Est. FW:    2431  gm      5 lb 6 oz     78  % ---------------------------------------------------------------------- OB History  Gravidity:    1         Term:   0        Prem:   0        SAB:   0  TOP:          0       Ectopic:  0        Living: 0 ---------------------------------------------------------------------- Gestational Age  LMP:           33w 2d        Date:  09/10/17                 EDD:   06/17/18  U/S Today:     34w 3d                                        EDD:   06/09/18  Best:          33w 2d     Det. By:  LMP  (09/10/17)          EDD:   06/17/18 ---------------------------------------------------------------------- Anatomy  Cranium:               Appears normal         Aortic Arch:            Appears normal  Cavum:                 Not well visualized    Ductal Arch:            Not well visualized  Ventricles:            Not well visualized    Diaphragm:              Appears normal   Choroid Plexus:        Not well visualized    Stomach:                Appears normal, left  sided  Cerebellum:            Not well visualized    Abdomen:                Appears normal  Posterior Fossa:       Not well visualized    Abdominal Wall:         Not well visualized  Nuchal Fold:           Not applicable (>75    Cord Vessels:           Not well visualized                         wks GA)  Face:                  Not well visualized    Kidneys:                Appear normal  Lips:                  Not well visualized    Bladder:                Appears normal  Thoracic:              Appears normal         Spine:                  Appears normal  Heart:                 Not well visualized    Upper Extremities:      Visualized  RVOT:                  Not well visualized    Lower Extremities:      Visualized  LVOT:                  Not well visualized  Other:  Fetus appears to be a female. Technically difficult due to maternal          habitus and fetal position. ---------------------------------------------------------------------- Cervix Uterus Adnexa  Cervix  Not visualized (advanced GA >24wks) ---------------------------------------------------------------------- Comments  U/S images reviewed. Findings reviewed with patient.  Appropriate fetal growth is noted.   No fetal abnormalities are  seen but anatomy is limited by fetal position, gestational age  and maternal body habitus.  BP - 140/83 (Left) and 140/89 (Right).  Patient denies  headaches, visual disturbance, mid-epigastric or RUQ pain.  Questions answered.  10 minutes spent face to face with patient.  Recommendations: 1) Serial U/S every 4 weeks for fetal  growth 2) Weekly BPP ---------------------------------------------------------------------- Recommendations  1) Serial U/S every 4 weeks for fetal growth 2) Weekly BPP  ----------------------------------------------------------------------                    Lajoyce Lauber, MD Electronically Signed Corrected Final Report  05/07/2018 01:58 pm ----------------------------------------------------------------------  [place] Mfm Ob Detail +14 Wk  Result Date: 05/07/2018 ----------------------------------------------------------------------  OBSTETRICS REPORT                    (Corrected Final 05/07/2018 01:58 pm) ---------------------------------------------------------------------- Patient Info  ID #:       [id number]                          D.O.B.:  1996-01-26 (22 yrs)  Name:       Roshini Topping                    Visit Date: 05/01/2018 03:14 pm ---------------------------------------------------------------------- Performed By  Performed By:     Berlinda Last          Ref. Address:     [place]                    [place], [place]  Attending:        Lajoyce Lauber      Location:         [facility]                    MD  Referred By:      Osborne Oman MD ---------------------------------------------------------------------- Orders   #  Description                          Code         Ordered By   1  [place] MFM FETAL BPP WO NON              49675.91     Auden Wettstein      STRESS   2  [place] MFM OB DETAIL +14 WK              76811.01     Verita Schneiders  ----------------------------------------------------------------------   #  Order #                    Accession #                 Episode #   1  [id number]   2  [id number]                  [id number]  ---------------------------------------------------------------------- Indications   Encounter for antenatal screening for  Z36.3   malformations   [redacted] weeks gestation of pregnancy                O6V.67   Obesity complicating pregnancy, third          O99.213   trimester   Hypertension - Chronic/Pre-existing            O10.019   (labetalol)  ---------------------------------------------------------------------- Fetal Evaluation  Num Of Fetuses:         1  Fetal Heart Rate(bpm):  138  Cardiac Activity:       Observed  Presentation:           Cephalic  Placenta:               Posterior  P. Cord Insertion:      Not well visualized  Amniotic Fluid  AFI FV:      Within normal limits  AFI Sum(cm)     %Tile       Largest Pocket(cm)  12.1            34          5.06  RUQ(cm)       RLQ(cm)       LUQ(cm)        LLQ(cm)  5.06          2.1           1.65           3.29 ---------------------------------------------------------------------- Biophysical Evaluation  Amniotic F.V:   Within normal limits       F. Tone:        Observed  F. Movement:    Observed                   Score:          8/8  F. Breathing:   Observed ---------------------------------------------------------------------- Biometry  BPD:      85.9  mm     G. Age:  34w 5d         82  %    CI:        76.26   %    70 - 86                                                          FL/HC:      20.7   %    19.9 - 21.5  HC:      311.7  mm     G. Age:  34w 6d         55  %    HC/AC:      1.01        0.96 - 1.11  AC:      309.7  mm     G. Age:  34w 6d         90  %    FL/BPD:     75.1   %    71 - 87  FL:       64.5  mm     G. Age:  33w 2d         39  %    FL/AC:      20.8   %    20 - 24  HUM:  59.7  mm     G. Age:  34w 5d         85  %  Est. FW:    2431  gm      5 lb 6 oz     78  % ---------------------------------------------------------------------- OB History  Gravidity:    1         Term:   0        Prem:   0        SAB:   0  TOP:          0       Ectopic:  0        Living: 0 ---------------------------------------------------------------------- Gestational Age  LMP:           33w 2d         Date:  09/10/17                 EDD:   06/17/18  U/S Today:     34w 3d                                        EDD:   06/09/18  Best:          33w 2d     Det. By:  LMP  (09/10/17)          EDD:   06/17/18 ---------------------------------------------------------------------- Anatomy  Cranium:               Appears normal         Aortic Arch:            Appears normal  Cavum:                 Not well visualized    Ductal Arch:            Not well visualized  Ventricles:            Not well visualized    Diaphragm:              Appears normal  Choroid Plexus:        Not well visualized    Stomach:                Appears normal, left                                                                        sided  Cerebellum:            Not well visualized    Abdomen:                Appears normal  Posterior Fossa:       Not well visualized    Abdominal Wall:         Not well visualized  Nuchal Fold:           Not applicable (>34    Cord Vessels:           Not well visualized  wks GA)  Face:                  Not well visualized    Kidneys:                Appear normal  Lips:                  Not well visualized    Bladder:                Appears normal  Thoracic:              Appears normal         Spine:                  Appears normal  Heart:                 Not well visualized    Upper Extremities:      Visualized  RVOT:                  Not well visualized    Lower Extremities:      Visualized  LVOT:                  Not well visualized  Other:  Fetus appears to be a female. Technically difficult due to maternal          habitus and fetal position. ---------------------------------------------------------------------- Cervix Uterus Adnexa  Cervix  Not visualized (advanced GA >24wks) ---------------------------------------------------------------------- Comments  U/S images reviewed. Findings reviewed with patient.  Appropriate fetal growth is noted.   No fetal abnormalities are  seen but anatomy is  limited by fetal position, gestational age  and maternal body habitus.  BP - 140/83 (Left) and 140/89 (Right).  Patient denies  headaches, visual disturbance, mid-epigastric or RUQ pain.  Questions answered.  10 minutes spent face to face with patient.  Recommendations: 1) Serial U/S every 4 weeks for fetal  growth 2) Weekly BPP ---------------------------------------------------------------------- Recommendations  1) Serial U/S every 4 weeks for fetal growth 2) Weekly BPP ----------------------------------------------------------------------                    Lajoyce Lauber, MD Electronically Signed Corrected Final Report  05/07/2018 01:58 pm ----------------------------------------------------------------------  US Abdomen Limited Ruq  Result Date: 05/07/2018 CLINICAL DATA:  Abnormal liver function tests. EXAM: ULTRASOUND ABDOMEN LIMITED RIGHT UPPER QUADRANT COMPARISON:  None. FINDINGS: Gallbladder: No discrete gallstones. There are floating echogenic foci consistent with cholesterol crystals. No wall thickening or pericholecystic fluid. Common bile duct: Diameter: 4 mm Liver: No focal lesion identified. Within normal limits in parenchymal echogenicity. Portal vein is patent on color Doppler imaging with normal direction of blood flow towards the liver. IMPRESSION: 1. No acute findings. 2. Some floating debris in the gallstone consistent with cholesterol crystals. No gallstones or evidence of acute cholecystitis. 3. Normal sonographic appearance of the liver. Electronically Signed   By: Lajean Manes M.D.   On: 05/07/2018 17:02    Future Appointments  Date Time Provider [place]  05/15/2018 12:30 PM [facility] [place] 1 [facility]  05/15/2018  1:30 PM [facility] NURSE [facility]  05/19/2018  9:45 AM [facility]FC [place] 2 [facility]-MFCUS MFC-US  05/21/2018 10:15 AM Darlina Rumpf, CNM [facility]  05/26/2018  1:45 PM [facility] [place] 2 [facility]-M[facility]US MFC-US    Discharge Condition:  Stable  Discharge disposition: 01-Home or Self Care        Allergies as of  05/12/2018      Reactions   Etodolac Nausea Only   Meloxicam Other (See Comments)   Abdominal pain   Flexeril [cyclobenzaprine] Nausea Only   Nsaids Other (See Comments)   Stomach pain   Prozac [fluoxetine Hcl] Other (See Comments)   Felt bad/awful      Medication List    STOP taking these medications   labetalol 100 MG tablet Commonly known as:  NORMODYNE     TAKE these medications   calcium carbonate 500 MG chewable tablet Commonly known as:  TUMS - dosed in mg elemental calcium Chew 2 tablets by mouth 4 (four) times daily as needed for indigestion or heartburn.   CITRANATAL BLOOM DHA 90-1 & 300 MG Misc Take 1 tablet by mouth daily.   COMFORT FIT MATERNITY SUPP LG Misc 1 Units by Does not apply route daily as needed.   hydrOXYzine 25 MG tablet Commonly known as:  ATARAX/VISTARIL Take 1-2 tablets (25-50 mg total) by mouth every 6 (six) hours as needed for anxiety.   NIFEdipine 60 MG 24 hr tablet Commonly known as:  ADALAT CC Take 1 tablet (60 mg total) by mouth daily. Start taking on:  05/13/2018   nitrofurantoin (macrocrystal-monohydrate) 100 MG capsule Commonly known as:  MACROBID Take 100 mg by mouth at bedtime.   PREPLUS 27-1 MG Tabs Take 1 tablet by mouth daily.        Signed: Verita Schneiders M.D. 05/12/2018, 3:45 PM

## 2018-05-13 ENCOUNTER — Other Ambulatory Visit (HOSPITAL_COMMUNITY): Payer: Self-pay | Admitting: *Deleted

## 2018-05-13 DIAGNOSIS — Z362 Encounter for other antenatal screening follow-up: Secondary | ICD-10-CM

## 2018-05-14 ENCOUNTER — Ambulatory Visit (HOSPITAL_COMMUNITY): Payer: BLUE CROSS/BLUE SHIELD

## 2018-05-14 ENCOUNTER — Encounter: Payer: BLUE CROSS/BLUE SHIELD | Admitting: Family Medicine

## 2018-05-14 ENCOUNTER — Encounter (HOSPITAL_COMMUNITY): Payer: Self-pay

## 2018-05-15 ENCOUNTER — Ambulatory Visit: Payer: BLUE CROSS/BLUE SHIELD

## 2018-05-15 ENCOUNTER — Ambulatory Visit: Payer: BLUE CROSS/BLUE SHIELD | Admitting: *Deleted

## 2018-05-15 ENCOUNTER — Encounter (HOSPITAL_COMMUNITY): Payer: Self-pay

## 2018-05-15 ENCOUNTER — Ambulatory Visit (HOSPITAL_BASED_OUTPATIENT_CLINIC_OR_DEPARTMENT_OTHER)
Admit: 2018-05-15 | Discharge: 2018-05-15 | Disposition: A | Discharge status: Home / Self Care | Payer: BLUE CROSS/BLUE SHIELD | Attending: Internal Medicine | Admitting: Internal Medicine

## 2018-05-15 VITALS — BP 138/78 | HR 102 | Wt 316.0 lb

## 2018-05-15 DIAGNOSIS — O10919 Unspecified pre-existing hypertension complicating pregnancy, unspecified trimester: Secondary | ICD-10-CM

## 2018-05-15 DIAGNOSIS — Z3A35 35 weeks gestation of pregnancy: Secondary | ICD-10-CM

## 2018-05-15 DIAGNOSIS — O10013 Pre-existing essential hypertension complicating pregnancy, third trimester: Secondary | ICD-10-CM | POA: Diagnosis not present

## 2018-05-15 DIAGNOSIS — O99213 Obesity complicating pregnancy, third trimester: Secondary | ICD-10-CM | POA: Diagnosis not present

## 2018-05-15 DIAGNOSIS — Z362 Encounter for other antenatal screening follow-up: Secondary | ICD-10-CM

## 2018-05-15 DIAGNOSIS — R7989 Other specified abnormal findings of blood chemistry: Secondary | ICD-10-CM

## 2018-05-15 DIAGNOSIS — O163 Unspecified maternal hypertension, third trimester: Secondary | ICD-10-CM

## 2018-05-15 DIAGNOSIS — R945 Abnormal results of liver function studies: Secondary | ICD-10-CM

## 2018-05-15 LAB — COMPREHENSIVE METABOLIC PANEL
ALK PHOS: 344 IU/L — AB (ref 39–117)
ALT: 136 IU/L — ABNORMAL HIGH (ref 0–32)
AST: 60 IU/L — ABNORMAL HIGH (ref 0–40)
Albumin/Globulin Ratio: 1.1 — ABNORMAL LOW (ref 1.2–2.2)
Albumin: 3.6 g/dL (ref 3.5–5.5)
BILIRUBIN TOTAL: 0.3 mg/dL (ref 0.0–1.2)
BUN/Creatinine Ratio: 11 (ref 9–23)
BUN: 4 mg/dL — ABNORMAL LOW (ref 6–20)
CO2: 16 mmol/L — ABNORMAL LOW (ref 20–29)
Calcium: 9.6 mg/dL (ref 8.7–10.2)
Chloride: 98 mmol/L (ref 96–106)
Creatinine, Ser: 0.38 mg/dL — ABNORMAL LOW (ref 0.57–1.00)
GFR calc non Af Amer: 149 mL/min/{1.73_m2} (ref >59)
GFR, EST AFRICAN AMERICAN: 171 mL/min/{1.73_m2} (ref >59)
GLOBULIN, TOTAL: 3.2 g/dL (ref 1.5–4.5)
Glucose: 61 mg/dL — ABNORMAL LOW (ref 65–99)
Potassium: 4.1 mmol/L (ref 3.5–5.2)
Sodium: 135 mmol/L (ref 134–144)
TOTAL PROTEIN: 6.8 g/dL (ref 6.0–8.5)

## 2018-05-15 LAB — CBC
HEMATOCRIT: 36.6 % (ref 34.0–46.6)
Hemoglobin: 11.3 g/dL (ref 11.1–15.9)
MCH: 22.3 pg — AB (ref 26.6–33.0)
MCHC: 30.9 g/dL — ABNORMAL LOW (ref 31.5–35.7)
MCV: 72 fL — AB (ref 79–97)
Platelets: 306 10*3/uL (ref 150–450)
RBC: 5.06 x10E6/uL (ref 3.77–5.28)
RDW: 18 % — ABNORMAL HIGH (ref 12.3–15.4)
WBC: 10.4 10*3/uL (ref 3.4–10.8)

## 2018-05-16 NOTE — Progress Notes (Signed)
Entered in error

## 2018-05-16 NOTE — Progress Notes (Signed)
Continued decrease in LFTs! CBC is stable.  Patient was called and informed of reassuring results.  Will continue weekly lab checks.  Verita Schneiders, MD

## 2018-05-18 ENCOUNTER — Inpatient Hospital Stay (HOSPITAL_COMMUNITY)
Admission: AD | Admit: 2018-05-18 | Discharge: 2018-05-19 | DRG: 833 | Disposition: A | Discharge status: Home / Self Care | Payer: BLUE CROSS/BLUE SHIELD | Attending: Obstetrics and Gynecology | Admitting: Obstetrics and Gynecology

## 2018-05-18 ENCOUNTER — Encounter (HOSPITAL_COMMUNITY): Payer: Self-pay | Admitting: *Deleted

## 2018-05-18 ENCOUNTER — Other Ambulatory Visit: Payer: Self-pay

## 2018-05-18 DIAGNOSIS — O10013 Pre-existing essential hypertension complicating pregnancy, third trimester: Secondary | ICD-10-CM | POA: Diagnosis present

## 2018-05-18 DIAGNOSIS — Z3A35 35 weeks gestation of pregnancy: Secondary | ICD-10-CM | POA: Diagnosis not present

## 2018-05-18 DIAGNOSIS — O1490 Unspecified pre-eclampsia, unspecified trimester: Secondary | ICD-10-CM | POA: Diagnosis present

## 2018-05-18 DIAGNOSIS — O99213 Obesity complicating pregnancy, third trimester: Secondary | ICD-10-CM | POA: Diagnosis not present

## 2018-05-18 DIAGNOSIS — O10919 Unspecified pre-existing hypertension complicating pregnancy, unspecified trimester: Secondary | ICD-10-CM | POA: Diagnosis present

## 2018-05-18 DIAGNOSIS — O0993 Supervision of high risk pregnancy, unspecified, third trimester: Secondary | ICD-10-CM

## 2018-05-18 DIAGNOSIS — O1493 Unspecified pre-eclampsia, third trimester: Secondary | ICD-10-CM | POA: Diagnosis not present

## 2018-05-18 DIAGNOSIS — H539 Unspecified visual disturbance: Secondary | ICD-10-CM

## 2018-05-18 DIAGNOSIS — O10913 Unspecified pre-existing hypertension complicating pregnancy, third trimester: Secondary | ICD-10-CM | POA: Diagnosis not present

## 2018-05-18 LAB — COMPREHENSIVE METABOLIC PANEL
ALBUMIN: 2.7 g/dL — AB (ref 3.5–5.0)
ALK PHOS: 327 U/L — AB (ref 38–126)
ALT: 126 U/L — ABNORMAL HIGH (ref 0–44)
ANION GAP: 9 (ref 5–15)
AST: 68 U/L — ABNORMAL HIGH (ref 15–41)
BILIRUBIN TOTAL: 0.5 mg/dL (ref 0.3–1.2)
BUN: 5 mg/dL — ABNORMAL LOW (ref 6–20)
CALCIUM: 8.9 mg/dL (ref 8.9–10.3)
CO2: 20 mmol/L — ABNORMAL LOW (ref 22–32)
CREATININE: 0.39 mg/dL — AB (ref 0.44–1.00)
Chloride: 105 mmol/L (ref 98–111)
GFR calc Af Amer: >60 mL/min (ref >60)
GFR calc non Af Amer: >60 mL/min (ref >60)
GLUCOSE: 123 mg/dL — AB (ref 70–99)
Potassium: 4 mmol/L (ref 3.5–5.1)
Sodium: 134 mmol/L — ABNORMAL LOW (ref 135–145)
TOTAL PROTEIN: 6.8 g/dL (ref 6.5–8.1)

## 2018-05-18 LAB — CBC
HEMATOCRIT: 37.5 % (ref 36.0–46.0)
HEMOGLOBIN: 11.3 g/dL — AB (ref 12.0–15.0)
MCH: 22.3 pg — AB (ref 26.0–34.0)
MCHC: 30.1 g/dL (ref 30.0–36.0)
MCV: 74.1 fL — ABNORMAL LOW (ref 80.0–100.0)
Platelets: 256 10*3/uL (ref 150–400)
RBC: 5.06 MIL/uL (ref 3.87–5.11)
RDW: 17.9 % — ABNORMAL HIGH (ref 11.5–15.5)
WBC: 10.1 10*3/uL (ref 4.0–10.5)
nRBC: 0 % (ref 0.0–0.2)

## 2018-05-18 LAB — URINALYSIS, ROUTINE W REFLEX MICROSCOPIC
Bilirubin Urine: NEGATIVE
GLUCOSE, UA: NEGATIVE mg/dL
HGB URINE DIPSTICK: NEGATIVE
Ketones, ur: 5 mg/dL — AB
LEUKOCYTES UA: NEGATIVE
Nitrite: NEGATIVE
PROTEIN: 30 mg/dL — AB
Specific Gravity, Urine: 1.018 (ref 1.005–1.030)
pH: 6 (ref 5.0–8.0)

## 2018-05-18 LAB — TYPE AND SCREEN
ABO/RH(D): B POS
Antibody Screen: NEGATIVE

## 2018-05-18 MED ORDER — PRENATAL MULTIVITAMIN CH
1.0000 | ORAL_TABLET | Freq: Every day | ORAL | Status: DC
  Administered 2018-05-19: 1 via ORAL
  Filled 2018-05-18: qty 1

## 2018-05-18 MED ORDER — ACETAMINOPHEN 325 MG PO TABS
650.0000 mg | ORAL_TABLET | ORAL | Status: DC | PRN
  Administered 2018-05-19: 650 mg via ORAL
  Filled 2018-05-18: qty 2

## 2018-05-18 MED ORDER — DOCUSATE SODIUM 100 MG PO CAPS
100.0000 mg | ORAL_CAPSULE | Freq: Every day | ORAL | Status: DC
  Administered 2018-05-19: 100 mg via ORAL
  Filled 2018-05-18: qty 1

## 2018-05-18 MED ORDER — NIFEDIPINE ER OSMOTIC RELEASE 30 MG PO TB24
60.0000 mg | ORAL_TABLET | Freq: Every day | ORAL | Status: DC
  Administered 2018-05-19: 60 mg via ORAL
  Filled 2018-05-18: qty 2

## 2018-05-18 MED ORDER — ZOLPIDEM TARTRATE 5 MG PO TABS: 5.0000 mg | ORAL_TABLET | Freq: Every evening | ORAL | Status: DC | PRN

## 2018-05-18 MED ORDER — CALCIUM CARBONATE ANTACID 500 MG PO CHEW
2.0000 | CHEWABLE_TABLET | ORAL | Status: DC | PRN
  Administered 2018-05-19: 400 mg via ORAL
  Filled 2018-05-18: qty 2

## 2018-05-18 NOTE — Progress Notes (Signed)
Patient claims," I see flashes of light after I took a shower but it went away after few minutes."  Vital signs checked and recorded, denies epigastric pain nor headache, DTR= 1+ Clonus = absent.  Will continue to observe.

## 2018-05-18 NOTE — H&P (Signed)
Attestation signed by Florian Buff, MD at 05/18/2018 8:28 PM    Attestation of Attending Supervision of Advanced Practitioner (CNM/NP/PA): Evaluation and management procedures were performed by the Advanced Practitioner under my supervision and collaboration.  I have reviewed the Advanced Practitioner's note and chart, and I agree with the management and plan.     Jacelyn Grip MD  Attending Physician for the [facility]  05/18/2018  8:28 PM          Expand All Collapse All            Expand widget buttonCollapse widget button    Show:Clear all   ManualTemplateCopied  Added by:     Seabron Spates, CNM   Hover for detailscustomization button                                                                                                           Chief Complaint:  Floaters in vision; Abdominal Pain; and Hypertension      First Provider Initiated Contact with Patient 05/18/18 1224       HPI: Latoya Mora is a 22 y.o. G1P0000 at 54w5dwho presents to maternity admissions reporting visual changes today, which were flashes of light and new floaters in greater quantity than usual.  No headache or RUQ pain.  Has some nausea.  .  She reports good fetal movement, denies LOF, vaginal bleeding, vaginal itching/burning, urinary symptoms, h/a, dizziness, n/v, diarrhea, constipation or fever/chills.       Has been recently admitted for hypertension with elevated liver function tests, which later improved.  Med changed to procardia due to possible liver effects of Labetalol.  .     Hypertension   This is a recurrent problem. The current episode started today. The problem has been gradually worsening since onset. Associated symptoms include blurred vision (floaters and flashes). Pertinent negatives include no chest pain, headaches,  malaise/fatigue, palpitations, peripheral edema or shortness of breath. Treatments tried: Labetalol changed to Procardia XL. There are no compliance problems.       RN Note:        Was recently admitted and discharged for elevated LFT's and elevated BP   Started seeing floaters this AM, BP at home was 158/115   Has been taking procardia at home   Some nausea   No headache, no epigastric pain   Having some cramping             Electronically signed by Esperanza Sheets, RN         Past Medical History:       Past Medical History:    Diagnosis   Date    .   Allergy        .   Anxiety        .   Depression        .   Frequent headaches        .   Hypertension        .   PCOS (polycystic ovarian syndrome)  Past obstetric history:                   OB History    Gravida   Para   Term   Preterm   AB   Living    1   0   0   0   0   0    SAB   TAB   Ectopic   Multiple   Live Births    0   0   0   0   0         #   Outcome   Date   GA   Lbr Len/2nd   Weight   Sex   Delivery   Anes   PTL   Lv    1   Current                                              Past Surgical History:        Past Surgical History:    Procedure   Laterality   Date    .   TOOTH EXTRACTION                  Family History:        Family History    Problem   Relation   Age of Onset    .   Alcohol abuse   Mother        .   Drug abuse   Mother        .   Depression   Mother        .   Anxiety disorder   Mother        .   Heart disease   Mother        .   Skin cancer   Mother        .   Scoliosis   Mother        .   Alcohol abuse   Father        .   Bipolar disorder   Father        .   Depression   Father        .    Osteochondroma   Father        .   COPD   Maternal Grandmother        .   Scoliosis   Maternal Grandmother        .   COPD   Maternal Grandfather        .   Skin cancer   Maternal Grandfather        .   Arthritis   Paternal Grandmother        .   Bipolar disorder   Paternal Grandfather        .   Osteochondroma   Sister        .   Osteochondroma   Brother        .   Cancer   Neg Hx              Social History:   Social History            Tobacco Use    .   Smoking status:   Former Research scientist (life sciences)    .  Smokeless tobacco:   Never Used    .   Tobacco comment: quit 11/12/2010     Substance Use Topics    .   Alcohol use:   Not Currently    .   Drug use:   No          Allergies:         Allergies    Allergen   Reactions    .   Etodolac   Nausea Only    .   Meloxicam   Other (See Comments)            Abdominal pain    .   Flexeril [Cyclobenzaprine]   Nausea Only    .   Nsaids   Other (See Comments)            Stomach pain    .   Prozac [Fluoxetine Hcl]   Other (See Comments)            Felt bad/awful          Meds:           Medications Prior to Admission    Medication   Sig   Dispense   Refill   Last Dose    .   calcium carbonate (TUMS - DOSED IN MG ELEMENTAL CALCIUM) 500 MG chewable tablet   Chew 2 tablets by mouth 4 (four) times daily as needed for indigestion or heartburn.           Taking    .   Elastic Bandages & Supports (COMFORT FIT MATERNITY SUPP LG) MISC   1 Units by Does not apply route daily as needed. (Patient not taking: Reported on 05/05/2018)   1 each   0   Not Taking    .   hydrOXYzine (ATARAX/VISTARIL) 25 MG tablet   Take 1-2 tablets (25-50 mg total) by mouth every 6 (six) hours as needed for anxiety. (Patient not taking: Reported on 05/07/2018)    30 tablet   2   Not Taking    .   NIFEdipine (ADALAT CC) 60 MG 24 hr tablet   Take 1 tablet (60 mg total) by mouth daily.   30 tablet   0   Taking    .   nitrofurantoin, macrocrystal-monohydrate, (MACROBID) 100 MG capsule   Take 100 mg by mouth at bedtime.           Taking    .   Prenat w/o A-FeCbGl-DSS-FA-DHA (CITRANATAL BLOOM DHA) 90-1 & 300 MG MISC   Take 1 tablet by mouth daily. (Patient not taking: Reported on 05/05/2018)   30 each   11   Not Taking at Unknown time    .   Prenatal Vit-Fe Fumarate-FA (PREPLUS) 27-1 MG TABS   Take 1 tablet by mouth daily.   30 tablet   6   Taking          I have reviewed patient's Past Medical Hx, Surgical Hx, Family Hx, Social Hx, medications and allergies.      ROS:   Review of Systems   Constitutional: Negative for malaise/fatigue.   Eyes: Positive for blurred vision (floaters and flashes).   Respiratory: Negative for shortness of breath.    Cardiovascular: Negative for chest pain and palpitations.   Neurological: Negative for headaches.      Other systems negative      Physical Exam       Patient Vitals for the past 24 hrs:  BP   Temp   Temp src   Pulse   Resp   Height   Weight    05/18/18 1156   (!) 191/95   98 F (36.7 C)   Oral   (!) 114   18   5\' 8"  (1.727 m)   (!) 144.4 kg              Vitals:        05/18/18 1216   05/18/18 1231   05/18/18 1246   05/18/18 1302    BP:   134/85   126/88   (!) 138/99   (!) 124/92    Pulse:   (!) 117   (!) 123   (!) 112   (!) 118    Resp:                    Temp:                    TempSrc:                    Weight:                    Height:                          Constitutional: Well-developed, well-nourished female in no acute distress.   Cardiovascular: normal rate and rhythm  Respiratory:  normal effort, clear to auscultation bilaterally  GI: Abd soft, non-tender, gravid appropriate for gestational age.   No rebound or guarding.  MS: Extremities nontender, Trace edema, normal ROM  Neurologic: Alert and oriented x 4. DTRs 2+ with no clonus.  GU: Neg CVAT.     PELVIC EXAM:  deferred     FHT:  Baseline 140 , moderate variability, accelerations present, no decelerations  Contractions: Irregular., occasional     Labs:  --/--/B POS (11/16 0755)  Lab Results Last 24 Hours  Imaging:         MAU Course/MDM:  I have ordered labs and reviewed results. LFTs are stable.  Platelets normal   NST reviewed and is reactive, category I  Consult Dr Elonda Husky with presentation, exam findings and  test results.  He recommends observation overnight due to new visual changes  Treatments in MAU included EFM.       Assessment:  Single intrauterine pregnancy at [redacted]w[redacted]d  Chronic hypertension in pregnancy, with elevated LFTs  New visual changes with stable LFTs     Plan:  Admit to Antenatal for observation  24 hour urine  Repeat labs in AM  NST q shift  MD to follow     Hansel Feinstein CNM, MSN  Certified Nurse-Midwife  05/18/2018  12:24 PM

## 2018-05-18 NOTE — MAU Provider Note (Signed)
Chief Complaint:  Floaters in vision; Abdominal Pain; and Hypertension   First Provider Initiated Contact with Patient 05/18/18 1224     HPI: Latoya Mora is a 22 y.o. G1P0000 at 16w5dwho presents to maternity admissions reporting visual changes today, which were flashes of light and new floaters in greater quantity than usual.  No headache or RUQ pain.  Has some nausea.  . She reports good fetal movement, denies LOF, vaginal bleeding, vaginal itching/burning, urinary symptoms, h/a, dizziness, n/v, diarrhea, constipation or fever/chills.    Has been recently admitted for hypertension with elevated liver function tests, which later improved.  Med changed to procardia due to possible liver effects of Labetalol.  .  Hypertension  This is a recurrent problem. The current episode started today. The problem has been gradually worsening since onset. Associated symptoms include blurred vision (floaters and flashes). Pertinent negatives include no chest pain, headaches, malaise/fatigue, palpitations, peripheral edema or shortness of breath. Treatments tried: Labetalol changed to Procardia XL. There are no compliance problems.    RN Note: Was recently admitted and discharged for elevated LFT's and elevated BP Started seeing floaters this AM, BP at home was 158/115 Has been taking procardia at home Some nausea No headache, no epigastric pain Having some cramping        Electronically signed by Esperanza Sheets, RN    Past Medical History: Past Medical History:  Diagnosis Date  . Allergy   . Anxiety   . Depression   . Frequent headaches   . Hypertension   . PCOS (polycystic ovarian syndrome)     Past obstetric history: OB History  Gravida Para Term Preterm AB Living  1 0 0 0 0 0  SAB TAB Ectopic Multiple Live Births  0 0 0 0 0    # Outcome Date GA Lbr Len/2nd Weight Sex Delivery Anes PTL Lv  1 Current             Past Surgical History: Past Surgical History:  Procedure  Laterality Date  . TOOTH EXTRACTION      Family History: Family History  Problem Relation Age of Onset  . Alcohol abuse Mother   . Drug abuse Mother   . Depression Mother   . Anxiety disorder Mother   . Heart disease Mother   . Skin cancer Mother   . Scoliosis Mother   . Alcohol abuse Father   . Bipolar disorder Father   . Depression Father   . Osteochondroma Father   . COPD Maternal Grandmother   . Scoliosis Maternal Grandmother   . COPD Maternal Grandfather   . Skin cancer Maternal Grandfather   . Arthritis Paternal Grandmother   . Bipolar disorder Paternal Grandfather   . Osteochondroma Sister   . Osteochondroma Brother   . Cancer Neg Hx     Social History: Social History   Tobacco Use  . Smoking status: Former Research scientist (life sciences)  . Smokeless tobacco: Never Used  . Tobacco comment: quit 11/12/2010   Substance Use Topics  . Alcohol use: Not Currently  . Drug use: No    Allergies:  Allergies  Allergen Reactions  . Etodolac Nausea Only  . Meloxicam Other (See Comments)    Abdominal pain  . Flexeril [Cyclobenzaprine] Nausea Only  . Nsaids Other (See Comments)    Stomach pain  . Prozac [Fluoxetine Hcl] Other (See Comments)    Felt bad/awful    Meds:  Medications Prior to Admission  Medication Sig Dispense Refill Last Dose  . calcium  carbonate (TUMS - DOSED IN MG ELEMENTAL CALCIUM) 500 MG chewable tablet Chew 2 tablets by mouth 4 (four) times daily as needed for indigestion or heartburn.   Taking  . Elastic Bandages & Supports (COMFORT FIT MATERNITY SUPP LG) MISC 1 Units by Does not apply route daily as needed. (Patient not taking: Reported on 05/05/2018) 1 each 0 Not Taking  . hydrOXYzine (ATARAX/VISTARIL) 25 MG tablet Take 1-2 tablets (25-50 mg total) by mouth every 6 (six) hours as needed for anxiety. (Patient not taking: Reported on 05/07/2018) 30 tablet 2 Not Taking  . NIFEdipine (ADALAT CC) 60 MG 24 hr tablet Take 1 tablet (60 mg total) by mouth daily. 30 tablet 0  Taking  . nitrofurantoin, macrocrystal-monohydrate, (MACROBID) 100 MG capsule Take 100 mg by mouth at bedtime.   Taking  . Prenat w/o A-FeCbGl-DSS-FA-DHA (CITRANATAL BLOOM DHA) 90-1 & 300 MG MISC Take 1 tablet by mouth daily. (Patient not taking: Reported on 05/05/2018) 30 each 11 Not Taking at Unknown time  . Prenatal Vit-Fe Fumarate-FA (PREPLUS) 27-1 MG TABS Take 1 tablet by mouth daily. 30 tablet 6 Taking    I have reviewed patient's Past Medical Hx, Surgical Hx, Family Hx, Social Hx, medications and allergies.   ROS:  Review of Systems  Constitutional: Negative for malaise/fatigue.  Eyes: Positive for blurred vision (floaters and flashes).  Respiratory: Negative for shortness of breath.   Cardiovascular: Negative for chest pain and palpitations.  Neurological: Negative for headaches.   Other systems negative  Physical Exam   Patient Vitals for the past 24 hrs:  BP Temp Temp src Pulse Resp Height Weight  05/18/18 1156 (!) 191/95 98 F (36.7 C) Oral (!) 114 18 5\' 8"  (1.727 m) (!) 144.4 kg   Vitals:   05/18/18 1216 05/18/18 1231 05/18/18 1246 05/18/18 1302  BP: 134/85 126/88 (!) 138/99 (!) 124/92  Pulse: (!) 117 (!) 123 (!) 112 (!) 118  Resp:      Temp:      TempSrc:      Weight:      Height:        Constitutional: Well-developed, well-nourished female in no acute distress.  Cardiovascular: normal rate and rhythm Respiratory: normal effort, clear to auscultation bilaterally GI: Abd soft, non-tender, gravid appropriate for gestational age.   No rebound or guarding. MS: Extremities nontender, Trace edema, normal ROM Neurologic: Alert and oriented x 4. DTRs 2+ with no clonus. GU: Neg CVAT.  PELVIC EXAM:  deferred  FHT:  Baseline 140 , moderate variability, accelerations present, no decelerations Contractions: Irregular., occasional   Labs: --/--/B POS (11/16 7829) Results for orders placed or performed during the hospital encounter of 05/18/18 (from the past 24  hour(s))  Urinalysis, Routine w reflex microscopic     Status: Abnormal   Collection Time: 05/18/18 12:05 PM  Result Value Ref Range   Color, Urine AMBER (A) YELLOW   APPearance HAZY (A) CLEAR   Specific Gravity, Urine 1.018 1.005 - 1.030   pH 6.0 5.0 - 8.0   Glucose, UA NEGATIVE NEGATIVE mg/dL   Hgb urine dipstick NEGATIVE NEGATIVE   Bilirubin Urine NEGATIVE NEGATIVE   Ketones, ur 5 (A) NEGATIVE mg/dL   Protein, ur 30 (A) NEGATIVE mg/dL   Nitrite NEGATIVE NEGATIVE   Leukocytes, UA NEGATIVE NEGATIVE   RBC / HPF 0-5 0 - 5 RBC/hpf   WBC, UA 0-5 0 - 5 WBC/hpf   Bacteria, UA MANY (A) NONE SEEN   Squamous Epithelial / LPF 11-20 0 -  5   Mucus PRESENT   Type and screen     Status: None   Collection Time: 05/18/18 12:25 PM  Result Value Ref Range   ABO/RH(D) B POS    Antibody Screen NEG    Sample Expiration      05/21/2018 Performed at [facility], [address], [place], [place] [address]   CBC     Status: Abnormal   Collection Time: 05/18/18 12:25 PM  Result Value Ref Range   WBC 10.1 4.0 - 10.5 K/uL   RBC 5.06 3.87 - 5.11 MIL/uL   Hemoglobin 11.3 (L) 12.0 - 15.0 g/dL   HCT 37.5 36.0 - 46.0 %   MCV 74.1 (L) 80.0 - 100.0 fL   MCH 22.3 (L) 26.0 - 34.0 pg   MCHC 30.1 30.0 - 36.0 g/dL   RDW 17.9 (H) 11.5 - 15.5 %   Platelets 256 150 - 400 K/uL   nRBC 0.0 0.0 - 0.2 %  Comprehensive metabolic panel     Status: Abnormal   Collection Time: 05/18/18 12:25 PM  Result Value Ref Range   Sodium 134 (L) 135 - 145 mmol/L   Potassium 4.0 3.5 - 5.1 mmol/L   Chloride 105 98 - 111 mmol/L   CO2 20 (L) 22 - 32 mmol/L   Glucose, Bld 123 (H) 70 - 99 mg/dL   BUN 5 (L) 6 - 20 mg/dL   Creatinine, Ser 0.39 (L) 0.44 - 1.00 mg/dL   Calcium 8.9 8.9 - 10.3 mg/dL   Total Protein 6.8 6.5 - 8.1 g/dL   Albumin 2.7 (L) 3.5 - 5.0 g/dL   AST 68 (H) 15 - 41 U/L   ALT 126 (H) 0 - 44 U/L   Alkaline Phosphatase 327 (H) 38 - 126 U/L   Total Bilirubin 0.5 0.3 - 1.2 mg/dL   GFR calc non Af Amer >60 >60  mL/min   GFR calc Af Amer >60 >60 mL/min   Anion gap 9 5 - 15    Imaging:    MAU Course/MDM: I have ordered labs and reviewed results. LFTs are stable.  Platelets normal  NST reviewed and is reactive, category I Consult Dr Elonda Husky with presentation, exam findings and test results.  He recommends observation overnight due to new visual changes Treatments in MAU included EFM.    Assessment: Single intrauterine pregnancy at [redacted]w[redacted]d Chronic hypertension in pregnancy, with elevated LFTs New visual changes with stable LFTs  Plan: Admit to Antenatal for observation 24 hour urine Repeat labs in AM NST q shift MD to follow  Hansel Feinstein CNM, MSN Certified Nurse-Midwife 05/18/2018 12:24 PM

## 2018-05-18 NOTE — MAU Note (Signed)
Was recently admitted and discharged for elevated LFT's and elevated BP  Started seeing floaters this AM, BP at home was 158/115  Has been taking procardia at home  Some nausea  No headache, no epigastric pain  Having some cramping

## 2018-05-19 ENCOUNTER — Inpatient Hospital Stay (HOSPITAL_BASED_OUTPATIENT_CLINIC_OR_DEPARTMENT_OTHER): Payer: BLUE CROSS/BLUE SHIELD

## 2018-05-19 ENCOUNTER — Inpatient Hospital Stay (HOSPITAL_COMMUNITY)
Admit: 2018-05-19 | Discharge: 2018-05-19 | Disposition: A | Discharge status: Home / Self Care | Payer: BLUE CROSS/BLUE SHIELD | Attending: Maternal and Fetal Medicine | Admitting: Maternal and Fetal Medicine

## 2018-05-19 DIAGNOSIS — Z3A35 35 weeks gestation of pregnancy: Secondary | ICD-10-CM

## 2018-05-19 DIAGNOSIS — O10913 Unspecified pre-existing hypertension complicating pregnancy, third trimester: Secondary | ICD-10-CM

## 2018-05-19 DIAGNOSIS — O1493 Unspecified pre-eclampsia, third trimester: Secondary | ICD-10-CM

## 2018-05-19 DIAGNOSIS — O10013 Pre-existing essential hypertension complicating pregnancy, third trimester: Secondary | ICD-10-CM

## 2018-05-19 DIAGNOSIS — O99213 Obesity complicating pregnancy, third trimester: Secondary | ICD-10-CM

## 2018-05-19 DIAGNOSIS — O1490 Unspecified pre-eclampsia, unspecified trimester: Secondary | ICD-10-CM | POA: Diagnosis present

## 2018-05-19 LAB — COMPREHENSIVE METABOLIC PANEL
ALT: 114 U/L — ABNORMAL HIGH (ref 0–44)
ANION GAP: 9 (ref 5–15)
AST: 59 U/L — ABNORMAL HIGH (ref 15–41)
Albumin: 2.5 g/dL — ABNORMAL LOW (ref 3.5–5.0)
Alkaline Phosphatase: 301 U/L — ABNORMAL HIGH (ref 38–126)
BUN: 5 mg/dL — ABNORMAL LOW (ref 6–20)
CHLORIDE: 106 mmol/L (ref 98–111)
CO2: 19 mmol/L — AB (ref 22–32)
Calcium: 8.4 mg/dL — ABNORMAL LOW (ref 8.9–10.3)
Creatinine, Ser: 0.46 mg/dL (ref 0.44–1.00)
Glucose, Bld: 150 mg/dL — ABNORMAL HIGH (ref 70–99)
POTASSIUM: 3.6 mmol/L (ref 3.5–5.1)
SODIUM: 134 mmol/L — AB (ref 135–145)
Total Bilirubin: 0.5 mg/dL (ref 0.3–1.2)
Total Protein: 6.8 g/dL (ref 6.5–8.1)

## 2018-05-19 LAB — CBC
HCT: 35.3 % — ABNORMAL LOW (ref 36.0–46.0)
Hemoglobin: 10.6 g/dL — ABNORMAL LOW (ref 12.0–15.0)
MCH: 22.3 pg — AB (ref 26.0–34.0)
MCHC: 30 g/dL (ref 30.0–36.0)
MCV: 74.2 fL — ABNORMAL LOW (ref 80.0–100.0)
PLATELETS: 258 10*3/uL (ref 150–400)
RBC: 4.76 MIL/uL (ref 3.87–5.11)
RDW: 18 % — AB (ref 11.5–15.5)
WBC: 8.5 10*3/uL (ref 4.0–10.5)
nRBC: 0 % (ref 0.0–0.2)

## 2018-05-19 LAB — PROTEIN, URINE, 24 HOUR
Collection Interval-UPROT: 24 hours
Protein, 24H Urine: 227 mg/d — ABNORMAL HIGH (ref 50–100)
Protein, Urine: 9 mg/dL
URINE TOTAL VOLUME-UPROT: 2525 mL

## 2018-05-19 LAB — CREATININE CLEARANCE, URINE, 24 HOUR
COLLECTION INTERVAL-CRCL: 24 h
CREAT CLEAR: 269 mL/min — AB (ref 75–115)
Creatinine, 24H Ur: 1779 mg/d (ref 600–1800)
Creatinine, Urine: 70.47 mg/dL
URINE TOTAL VOLUME-CRCL: 2525 mL

## 2018-05-19 NOTE — Discharge Instructions (Addendum)
Hypertension During Pregnancy Contact a doctor if:  You have symptoms that your doctor told you to watch for, such as: ? Fever. ? Throwing up (vomiting). ? Persistent headache, persistent blurry vision, flashes of lights in your vision Get help right away if:  You have very bad pain in your belly (abdomen).  You are throwing up, and this does not get better with treatment.  You suddenly get swelling in your hands, ankles, or face.  You gain 4 lb (1.8 kg) or more in 1 week.  You get bleeding from your vagina.  You have blood in your pee.  You do not feel your baby moving as much as normal.  You have a change in vision.  You have muscle twitching or sudden tightening (spasms).  You have trouble breathing.  Your lips or fingernails turn blue. This information is not intended to replace advice given to you by your health care provider. Make sure you discuss any questions you have with your health care provider. Document Released: 07/14/2010 Document Revised: 02/21/2016 Document Reviewed: 02/21/2016 Elsevier Interactive Patient Education  [employer].

## 2018-05-19 NOTE — Progress Notes (Signed)
Patient complaint of severe throbbing pain on right lateral wrist after blood drawn by phlebotomist.  Pulse +, hand and fingers warm, no redness noted.  Warm pack applied to decrease pain.  Advised patient to inform RN if pain gets worse.

## 2018-05-19 NOTE — Progress Notes (Signed)
Patient ID: Latoya Mora, female   DOB: Jan 31, 1996, 22 y.o.   MRN: [id number] [place]) NOTE  [name] is a 22 y.o. G1P0000 with Estimated Date of Delivery: 06/17/18   By   [redacted]w[redacted]d  who is admitted for evaluation of possible pre eclampsia.    Fetal presentation is cephalic. Length of Stay:  1  Days  Date of admission:05/18/2018  Subjective: Resolved headache and CNS symptoms Patient reports the fetal movement as active. Patient reports uterine contraction  activity as none. Patient reports  vaginal bleeding as none. Patient describes fluid per vagina as None.  Vitals:  Blood pressure 119/78, pulse (!) 102, temperature 98.3 F (36.8 C), temperature source Oral, resp. rate 20, height 5\' 8"  (1.727 m), weight (!) 144.4 kg, last menstrual period 09/10/2017, SpO2 100 %. Vitals:   05/18/18 2000 05/18/18 2339 05/19/18 0540 05/19/18 0801  BP: (!) 114/59 (!) 89/76 121/81 119/78  Pulse: (!) 106 97 (!) 106 (!) 102  Resp: 19 19 16 20   Temp:  98.5 F (36.9 C) 98.5 F (36.9 C) 98.3 F (36.8 C)  TempSrc:  Oral Oral Oral  SpO2: 100% 100% 100% 100%  Weight:      Height:       Physical Examination:  General appearance - alert, well appearing, and in no distress Fundal Height:  size equals dates Pelvic Exam: examination not indicated Cervical Exam: Not evaluated.  Extremities: extremities normal, atraumatic, no cyanosis or edema with DTRs 2+ bilaterally Membranes:intact  Fetal Monitoring:  Reactive NST     Labs:  Results for orders placed or performed during the hospital encounter of 05/18/18 (from the past 24 hour(s))  Urinalysis, Routine w reflex microscopic   Collection Time: 05/18/18 12:05 PM  Result Value Ref Range   Color, Urine AMBER (A) YELLOW   APPearance HAZY (A) CLEAR   Specific Gravity, Urine 1.018 1.005 - 1.030   pH 6.0 5.0 - 8.0   Glucose, UA NEGATIVE NEGATIVE mg/dL   Hgb urine dipstick NEGATIVE NEGATIVE   Bilirubin Urine NEGATIVE NEGATIVE    Ketones, ur 5 (A) NEGATIVE mg/dL   Protein, ur 30 (A) NEGATIVE mg/dL   Nitrite NEGATIVE NEGATIVE   Leukocytes, UA NEGATIVE NEGATIVE   RBC / HPF 0-5 0 - 5 RBC/hpf   WBC, UA 0-5 0 - 5 WBC/hpf   Bacteria, UA MANY (A) NONE SEEN   Squamous Epithelial / LPF 11-20 0 - 5   Mucus PRESENT   CBC   Collection Time: 05/18/18 12:25 PM  Result Value Ref Range   WBC 10.1 4.0 - 10.5 K/uL   RBC 5.06 3.87 - 5.11 MIL/uL   Hemoglobin 11.3 (L) 12.0 - 15.0 g/dL   HCT 37.5 36.0 - 46.0 %   MCV 74.1 (L) 80.0 - 100.0 fL   MCH 22.3 (L) 26.0 - 34.0 pg   MCHC 30.1 30.0 - 36.0 g/dL   RDW 17.9 (H) 11.5 - 15.5 %   Platelets 256 150 - 400 K/uL   nRBC 0.0 0.0 - 0.2 %  Comprehensive metabolic panel   Collection Time: 05/18/18 12:25 PM  Result Value Ref Range   Sodium 134 (L) 135 - 145 mmol/L   Potassium 4.0 3.5 - 5.1 mmol/L   Chloride 105 98 - 111 mmol/L   CO2 20 (L) 22 - 32 mmol/L   Glucose, Bld 123 (H) 70 - 99 mg/dL   BUN 5 (L) 6 - 20 mg/dL   Creatinine, Ser 0.39 (L) 0.44 - 1.00 mg/dL  Calcium 8.9 8.9 - 10.3 mg/dL   Total Protein 6.8 6.5 - 8.1 g/dL   Albumin 2.7 (L) 3.5 - 5.0 g/dL   AST 68 (H) 15 - 41 U/L   ALT 126 (H) 0 - 44 U/L   Alkaline Phosphatase 327 (H) 38 - 126 U/L   Total Bilirubin 0.5 0.3 - 1.2 mg/dL   GFR calc non Af Amer >60 >60 mL/min   GFR calc Af Amer >60 >60 mL/min   Anion gap 9 5 - 15  Type and screen   Collection Time: 05/18/18 12:25 PM  Result Value Ref Range   ABO/RH(D) B POS    Antibody Screen NEG    Sample Expiration      05/21/2018 Performed at [facility], [address] [place], [place] [address]   CBC   Collection Time: 05/19/18  5:41 AM  Result Value Ref Range   WBC 8.5 4.0 - 10.5 K/uL   RBC 4.76 3.87 - 5.11 MIL/uL   Hemoglobin 10.6 (L) 12.0 - 15.0 g/dL   HCT 35.3 (L) 36.0 - 46.0 %   MCV 74.2 (L) 80.0 - 100.0 fL   MCH 22.3 (L) 26.0 - 34.0 pg   MCHC 30.0 30.0 - 36.0 g/dL   RDW 18.0 (H) 11.5 - 15.5 %   Platelets 258 150 - 400 K/uL   nRBC 0.0 0.0 - 0.2 %   Comprehensive metabolic panel   Collection Time: 05/19/18  5:41 AM  Result Value Ref Range   Sodium 134 (L) 135 - 145 mmol/L   Potassium 3.6 3.5 - 5.1 mmol/L   Chloride 106 98 - 111 mmol/L   CO2 19 (L) 22 - 32 mmol/L   Glucose, Bld 150 (H) 70 - 99 mg/dL   BUN 5 (L) 6 - 20 mg/dL   Creatinine, Ser 0.46 0.44 - 1.00 mg/dL   Calcium 8.4 (L) 8.9 - 10.3 mg/dL   Total Protein 6.8 6.5 - 8.1 g/dL   Albumin 2.5 (L) 3.5 - 5.0 g/dL   AST 59 (H) 15 - 41 U/L   ALT 114 (H) 0 - 44 U/L   Alkaline Phosphatase 301 (H) 38 - 126 U/L   Total Bilirubin 0.5 0.3 - 1.2 mg/dL   GFR calc non Af Amer >60 >60 mL/min   GFR calc Af Amer >60 >60 mL/min   Anion gap 9 5 - 15    Imaging Studies:    Scheduled MFM sonogram this am  Medications:  Scheduled . docusate sodium  100 mg Oral Daily  . NIFEdipine  60 mg Oral Daily  . prenatal multivitamin  1 tablet Oral Q1200   I have reviewed the patient's current medications.  ASSESSMENT: G1P0000 [redacted]w[redacted]d Estimated Date of Delivery: 06/17/18  CHTN Elevated LFTs, stable at 50% of previous peak Brief episode of visual changes Patient Active Problem List   Diagnosis Date Noted  . Pre-eclampsia 05/19/2018  . Chronic hypertension affecting pregnancy 05/18/2018  . Abnormal liver function tests 05/12/2018  . Chronic hypertension complicating pregnancy, antepartum 04/24/2018  . Supervision of high-risk pregnancy 11/04/2017  . Ovarian cyst 11/04/2017  . PCOS (polycystic ovarian syndrome) 10/03/2017  . Obesity, Class III, BMI 40-49.9 (morbid obesity) ([place]) 04/03/2017  . Seasonal allergies 09/18/2016  . Anxiety and depression 09/18/2016  . Frequent headaches 09/18/2016    PLAN: >24 hour urine in progress >S/P BMZ with first admission>If BP remains in this range and no further CNS symptoms, plan to discharge after 24 urine is done this afternoon  Joellyn Grandt H Christifer Chapdelaine 05/19/2018,8:10 AM

## 2018-05-19 NOTE — Progress Notes (Signed)
Latoya Mora was discharged home.  She was instructed on preeclampsia symptoms.

## 2018-05-19 NOTE — Discharge Summary (Signed)
Discharge Summary   Admit Date: 05/18/2018 Discharge Date: 05/19/2018 Discharging Service: Antepartum  Primary OBGYN: [facility] for [facility] Admitting Physician: Florian Buff, MD  Discharge Physician: Ilda Basset  Referring Provider: Maternity Admission Unit  Primary Care Provider: Jearld Fenton, NP  Admission Diagnoses: *Pregnancy at 35/5 *Seeing floaters and elevated BPs at home *cHTN *Transaminitis *BMI 40s  Discharge Diagnoses: *Pregnancy at 35/6 *Resolved visual symptoms *cHTN *Transaminitis *BMI 40s  Consult Orders: None   Surgeries/Procedures Performed: None  History and Physical: Attestation signed by Florian Buff, MD at 05/18/2018 8:28 PM  Attestation of Attending Supervision of Advanced Practitioner (CNM/NP/PA): Evaluation and management procedures were performed by the Advanced Practitioner under my supervision and collaboration. I have reviewed the Advanced Practitioner's note and chart, and I agree with the management and plan.  Jacelyn Grip MD Attending Physician for the Center for [facility] 05/18/2018 8:28 PM     Expand All Collapse All    Chief Complaint:  Floaters in vision; Abdominal Pain; and Hypertension   First Provider Initiated Contact with Patient 05/18/18 1224     HPI: Latoya Mora is a 22 y.o. G1P0000 at 84w5dwho presents to maternity admissions reporting visual changes today, which were flashes of light and new floaters in greater quantity than usual.  No headache or RUQ pain.  Has some nausea.  . She reports good fetal movement, denies LOF, vaginal bleeding, vaginal itching/burning, urinary symptoms, h/a, dizziness, n/v, diarrhea, constipation or fever/chills.    Has been recently admitted for hypertension with elevated liver function tests, which later improved.  Med changed to procardia due to possible liver effects of Labetalol.  .  Hypertension  This is a recurrent problem. The current episode  started today. The problem has been gradually worsening since onset. Associated symptoms include blurred vision (floaters and flashes). Pertinent negatives include no chest pain, headaches, malaise/fatigue, palpitations, peripheral edema or shortness of breath. Treatments tried: Labetalol changed to Procardia XL. There are no compliance problems.    RN Note: Was recently admitted and discharged for elevated LFT's and elevated BP Started seeing floaters this AM, BP at home was 158/115 Has been taking procardia at home Some nausea No headache, no epigastric pain Having some cramping        Electronically signed by Esperanza Sheets, RN    Past Medical History:     Past Medical History:  Diagnosis Date  . Allergy   . Anxiety   . Depression   . Frequent headaches   . Hypertension   . PCOS (polycystic ovarian syndrome)     Past obstetric history:                 OB History  Gravida Para Term Preterm AB Living  1 0 0 0 0 0  SAB TAB Ectopic Multiple Live Births     0 0 0 0 0       # Outcome Date GA Lbr Len/2nd Weight Sex Delivery Anes PTL Lv  1 Current             Past Surgical History: Past Surgical History:  Procedure Laterality Date  . TOOTH EXTRACTION      Family History:      Family History  Problem Relation Age of Onset  . Alcohol abuse Mother   . Drug abuse Mother   . Depression Mother   . Anxiety disorder Mother   . Heart disease Mother   . Skin cancer Mother   .  Scoliosis Mother   . Alcohol abuse Father   . Bipolar disorder Father   . Depression Father   . Osteochondroma Father   . COPD Maternal Grandmother   . Scoliosis Maternal Grandmother   . COPD Maternal Grandfather   . Skin cancer Maternal Grandfather   . Arthritis Paternal Grandmother   . Bipolar disorder Paternal Grandfather   . Osteochondroma Sister   . Osteochondroma Brother   . Cancer Neg Hx     Social History: Social History         Tobacco Use  . Smoking status: Former Research scientist (life sciences)  . Smokeless tobacco: Never Used  . Tobacco comment: quit 11/12/2010   Substance Use Topics  . Alcohol use: Not Currently  . Drug use: No    Allergies:       Allergies  Allergen Reactions  . Etodolac Nausea Only  . Meloxicam Other (See Comments)    Abdominal pain  . Flexeril [Cyclobenzaprine] Nausea Only  . Nsaids Other (See Comments)    Stomach pain  . Prozac [Fluoxetine Hcl] Other (See Comments)    Felt bad/awful    Meds:         Medications Prior to Admission  Medication Sig Dispense Refill Last Dose  . calcium carbonate (TUMS - DOSED IN MG ELEMENTAL CALCIUM) 500 MG chewable tablet Chew 2 tablets by mouth 4 (four) times daily as needed for indigestion or heartburn.   Taking  . Elastic Bandages & Supports (COMFORT FIT MATERNITY SUPP LG) MISC 1 Units by Does not apply route daily as needed. (Patient not taking: Reported on 05/05/2018) 1 each 0 Not Taking  . hydrOXYzine (ATARAX/VISTARIL) 25 MG tablet Take 1-2 tablets (25-50 mg total) by mouth every 6 (six) hours as needed for anxiety. (Patient not taking: Reported on 05/07/2018) 30 tablet 2 Not Taking  . NIFEdipine (ADALAT CC) 60 MG 24 hr tablet Take 1 tablet (60 mg total) by mouth daily. 30 tablet 0 Taking  . nitrofurantoin, macrocrystal-monohydrate, (MACROBID) 100 MG capsule Take 100 mg by mouth at bedtime.   Taking  . Prenat w/o A-FeCbGl-DSS-FA-DHA (CITRANATAL BLOOM DHA) 90-1 & 300 MG MISC Take 1 tablet by mouth daily. (Patient not taking: Reported on 05/05/2018) 30 each 11 Not Taking at Unknown time  . Prenatal Vit-Fe Fumarate-FA (PREPLUS) 27-1 MG TABS Take 1 tablet by mouth daily. 30 tablet 6 Taking    I have reviewed patient's Past Medical Hx, Surgical Hx, Family Hx, Social Hx, medications and allergies.   ROS:  Review of Systems  Constitutional: Negative for malaise/fatigue.  Eyes: Positive for blurred vision (floaters and flashes).  Respiratory:  Negative for shortness of breath.   Cardiovascular: Negative for chest pain and palpitations.  Neurological: Negative for headaches.   Other systems negative  Physical Exam   Patient Vitals for the past 24 hrs:  BP Temp Temp src Pulse Resp Height Weight  05/18/18 1156 (!) 191/95 98 F (36.7 C) Oral (!) 114 18 5\' 8"  (1.727 m) (!) 144.4 kg         Vitals:   05/18/18 1216 05/18/18 1231 05/18/18 1246 05/18/18 1302  BP: 134/85 126/88 (!) 138/99 (!) 124/92  Pulse: (!) 117 (!) 123 (!) 112 (!) 118  Resp:      Temp:      TempSrc:      Weight:      Height:        Constitutional: Well-developed, well-nourished female in no acute distress.  Cardiovascular: normal rate and rhythm Respiratory: normal  effort, clear to auscultation bilaterally GI: Abd soft, non-tender, gravid appropriate for gestational age.   No rebound or guarding. MS: Extremities nontender, Trace edema, normal ROM Neurologic: Alert and oriented x 4. DTRs 2+ with no clonus. GU: Neg CVAT.  PELVIC EXAM:  deferred  FHT:  Baseline 140 , moderate variability, accelerations present, no decelerations Contractions: Irregular., occasional   Labs: --/--/B POS (11/16 0102) LabResultsLast24Hours        Results for orders placed or performed during the hospital encounter of 05/18/18 (from the past 24 hour(s))  Urinalysis, Routine w reflex microscopic     Status: Abnormal   Collection Time: 05/18/18 12:05 PM  Result Value Ref Range   Color, Urine AMBER (A) YELLOW   APPearance HAZY (A) CLEAR   Specific Gravity, Urine 1.018 1.005 - 1.030   pH 6.0 5.0 - 8.0   Glucose, UA NEGATIVE NEGATIVE mg/dL   Hgb urine dipstick NEGATIVE NEGATIVE   Bilirubin Urine NEGATIVE NEGATIVE   Ketones, ur 5 (A) NEGATIVE mg/dL   Protein, ur 30 (A) NEGATIVE mg/dL   Nitrite NEGATIVE NEGATIVE   Leukocytes, UA NEGATIVE NEGATIVE   RBC / HPF 0-5 0 - 5 RBC/hpf   WBC, UA 0-5 0 - 5 WBC/hpf   Bacteria, UA  MANY (A) NONE SEEN   Squamous Epithelial / LPF 11-20 0 - 5   Mucus PRESENT   Type and screen     Status: None   Collection Time: 05/18/18 12:25 PM  Result Value Ref Range   ABO/RH(D) B POS    Antibody Screen NEG    Sample Expiration      05/21/2018 Performed at [facility], [address] [place], [place] [address]   CBC     Status: Abnormal   Collection Time: 05/18/18 12:25 PM  Result Value Ref Range   WBC 10.1 4.0 - 10.5 K/uL   RBC 5.06 3.87 - 5.11 MIL/uL   Hemoglobin 11.3 (L) 12.0 - 15.0 g/dL   HCT 37.5 36.0 - 46.0 %   MCV 74.1 (L) 80.0 - 100.0 fL   MCH 22.3 (L) 26.0 - 34.0 pg   MCHC 30.1 30.0 - 36.0 g/dL   RDW 17.9 (H) 11.5 - 15.5 %   Platelets 256 150 - 400 K/uL   nRBC 0.0 0.0 - 0.2 %  Comprehensive metabolic panel     Status: Abnormal   Collection Time: 05/18/18 12:25 PM  Result Value Ref Range   Sodium 134 (L) 135 - 145 mmol/L   Potassium 4.0 3.5 - 5.1 mmol/L   Chloride 105 98 - 111 mmol/L   CO2 20 (L) 22 - 32 mmol/L   Glucose, Bld 123 (H) 70 - 99 mg/dL   BUN 5 (L) 6 - 20 mg/dL   Creatinine, Ser 0.39 (L) 0.44 - 1.00 mg/dL   Calcium 8.9 8.9 - 10.3 mg/dL   Total Protein 6.8 6.5 - 8.1 g/dL   Albumin 2.7 (L) 3.5 - 5.0 g/dL   AST 68 (H) 15 - 41 U/L   ALT 126 (H) 0 - 44 U/L   Alkaline Phosphatase 327 (H) 38 - 126 U/L   Total Bilirubin 0.5 0.3 - 1.2 mg/dL   GFR calc non Af Amer >60 >60 mL/min   GFR calc Af Amer >60 >60 mL/min   Anion gap 9 5 - 15      Imaging:    MAU Course/MDM: I have ordered labs and reviewed results. LFTs are stable.  Platelets normal  NST reviewed and is  reactive, category I Consult Dr Elonda Husky with presentation, exam findings and test results.  He recommends observation overnight due to new visual changes Treatments in MAU included EFM.    Assessment: Single intrauterine pregnancy at [redacted]w[redacted]d Chronic hypertension in pregnancy, with elevated LFTs New visual changes with stable  LFTs  Plan: Admit to Antenatal for observation 24 hour urine Repeat labs in AM NST q shift MD to follow  Hansel Feinstein CNM, MSN Certified Nurse-Midwife 05/18/2018 12:24 PM     Hospital Course: *Pregnancy: BPP on 13/24 was 8/8, cephalic, and reactive NST today *cHTN: stable BPs on home medications. Patient never needed IV meds in triage or after admissions. Labs were stable and her visual s/s resolved on the day of discharge. Prior 24h urine was 235 and a repeat was pending on discharge, but I didn't feel that new proteinuria would warrant keeping her since everything else looked fine  Discharge Exam:   Current Vital Signs 24h Vital Sign Ranges  T 97.7 F (36.5 C) Temp  Avg: 98.3 F (36.8 C)  Min: 97.7 F (36.5 C)  Max: 98.5 F (36.9 C)  BP 119/70 BP  Min: 89/76  Max: 147/101  HR (!) 104 Pulse  Avg: 104.2  Min: 97  Max: 110  RR 18 Resp  Avg: 18.5  Min: 16  Max: 20  SaO2 100 % Room Air SpO2  Avg: 99.3 %  Min: 96 %  Max: 100 %       24 Hour I/O Current Shift I/O  Time Ins Outs 11/24 0701 - 11/25 0700 In: -  Out: 1200 [Urine:1200] No intake/output data recorded.    Patient Vitals for the past 24 hrs:  BP Temp Temp src Pulse Resp SpO2  05/19/18 1210 119/70 97.7 F (36.5 C) Oral (!) 104 18 100 %  05/19/18 0801 119/78 98.3 F (36.8 C) Oral (!) 102 20 100 %  05/19/18 0540 121/81 98.5 F (36.9 C) Oral (!) 106 16 100 %  05/18/18 2339 (!) 89/76 98.5 F (36.9 C) Oral 97 19 100 %  05/18/18 2000 (!) 114/59 - - (!) 106 19 100 %  05/18/18 1941 (!) 147/101 98.4 F (36.9 C) Oral (!) 110 19 96 %   Per  Dr. Elonda Husky Physical Examination:  General appearance - alert, well appearing, and in no distress Fundal Height:  size equals dates Pelvic Exam: examination not indicated Cervical Exam: Not evaluated.  Extremities: extremities normal, atraumatic, no cyanosis or edema with DTRs 2+ bilaterally Membranes:intact  Discharge Disposition:  Home  Patient Instructions:  Standard    Results Pending at Discharge:  24h urine protein  Discharge Medications: Allergies as of 05/19/2018      Reactions   Etodolac Nausea Only   Meloxicam Other (See Comments)   Abdominal pain   Flexeril [cyclobenzaprine] Nausea Only   Nsaids Other (See Comments)   Stomach pain   Prozac [fluoxetine Hcl] Other (See Comments)   Felt bad/awful      Medication List    STOP taking these medications   CITRANATAL BLOOM DHA 90-1 & 300 MG Misc     TAKE these medications   calcium carbonate 500 MG chewable tablet Commonly known as:  TUMS - dosed in mg elemental calcium Chew 2 tablets by mouth 4 (four) times daily as needed for indigestion or heartburn.   COMFORT FIT MATERNITY SUPP LG Misc 1 Units by Does not apply route daily as needed.   hydrOXYzine 25 MG tablet Commonly known as:  ATARAX/VISTARIL Take  1-2 tablets (25-50 mg total) by mouth every 6 (six) hours as needed for anxiety.   NIFEdipine 60 MG 24 hr tablet Commonly known as:  ADALAT CC Take 1 tablet (60 mg total) by mouth daily.   nitrofurantoin (macrocrystal-monohydrate) 100 MG capsule Commonly known as:  MACROBID Take 100 mg by mouth daily.   PREPLUS 27-1 MG Tabs Take 1 tablet by mouth daily.        Future Appointments  Date Time Provider [place]  05/21/2018 10:15 AM Kieth Brightly [facility]  05/26/2018  1:45 PM [place] 2 [facility]    Durene Romans MD Attending [facility] for [facility])

## 2018-05-21 ENCOUNTER — Other Ambulatory Visit: Payer: Self-pay | Admitting: Advanced Practice Midwife

## 2018-05-21 ENCOUNTER — Encounter (HOSPITAL_COMMUNITY): Payer: Self-pay | Admitting: *Deleted

## 2018-05-21 ENCOUNTER — Telehealth (HOSPITAL_COMMUNITY): Payer: Self-pay | Admitting: *Deleted

## 2018-05-21 ENCOUNTER — Other Ambulatory Visit (HOSPITAL_COMMUNITY)
Admission: RE | Admit: 2018-05-21 | Discharge: 2018-05-21 | Disposition: A | Discharge status: Home / Self Care | Payer: BLUE CROSS/BLUE SHIELD | Source: Ambulatory Visit | Attending: Advanced Practice Midwife | Admitting: Advanced Practice Midwife

## 2018-05-21 ENCOUNTER — Other Ambulatory Visit (HOSPITAL_COMMUNITY): Payer: BLUE CROSS/BLUE SHIELD

## 2018-05-21 ENCOUNTER — Ambulatory Visit (INDEPENDENT_AMBULATORY_CARE_PROVIDER_SITE_OTHER): Payer: BLUE CROSS/BLUE SHIELD | Admitting: Advanced Practice Midwife

## 2018-05-21 VITALS — BP 139/86 | HR 104

## 2018-05-21 DIAGNOSIS — O10013 Pre-existing essential hypertension complicating pregnancy, third trimester: Secondary | ICD-10-CM

## 2018-05-21 DIAGNOSIS — Z3490 Encounter for supervision of normal pregnancy, unspecified, unspecified trimester: Secondary | ICD-10-CM

## 2018-05-21 DIAGNOSIS — O0993 Supervision of high risk pregnancy, unspecified, third trimester: Secondary | ICD-10-CM

## 2018-05-21 DIAGNOSIS — O10919 Unspecified pre-existing hypertension complicating pregnancy, unspecified trimester: Secondary | ICD-10-CM

## 2018-05-21 DIAGNOSIS — Z3A36 36 weeks gestation of pregnancy: Secondary | ICD-10-CM

## 2018-05-21 NOTE — Progress Notes (Signed)
Orders placed for IOL scheduled for 05/27/18 at 0700. 1/thick/-2 on 11/27. Plan for foley bulb plus Cytotec.  Mallie Snooks, CNM 05/21/18 11:56 AM

## 2018-05-21 NOTE — Telephone Encounter (Signed)
Preadmission screen  

## 2018-05-21 NOTE — Progress Notes (Signed)
   PRENATAL VISIT NOTE  Subjective:  Latoya Mora is a 22 y.o. G1P0000 at [redacted]w[redacted]d being seen today for ongoing prenatal care.  She is currently monitored for the following issues for this high-risk pregnancy and has Seasonal allergies; Anxiety and depression; Frequent headaches; Obesity, Class III, BMI 40-49.9 (morbid obesity) ([place]); PCOS (polycystic ovarian syndrome); Supervision of high-risk pregnancy; Ovarian cyst; Chronic hypertension complicating pregnancy, antepartum; Abnormal liver function tests; Chronic hypertension affecting pregnancy; and Pre-eclampsia on their problem list.  Patient reports no complaints.  Contractions: Not present. Vag. Bleeding: None.  Movement: Present. Denies leaking of fluid.   The following portions of the patient's history were reviewed and updated as appropriate: allergies, current medications, past family history, past medical history, past social history, past surgical history and problem list. Problem list updated.  Objective:   Vitals:   05/21/18 1029  BP: 139/86  Pulse: (!) 104    Fetal Status: Fetal Heart Rate (bpm): 150s   Movement: Present  Presentation: Vertex  General:  Alert, oriented and cooperative. Patient is in no acute distress.  Skin: Skin is warm and dry. No rash noted.   Cardiovascular: Normal heart rate noted  Respiratory: Normal respiratory effort, no problems with respiration noted  Abdomen: Soft, gravid, appropriate for gestational age.  Pain/Pressure: Present     Pelvic: Cervical exam performed Dilation: 1 Effacement (%): 50 Station: -2  Extremities: Normal range of motion.     Mental Status: Normal mood and affect. Normal behavior. Normal judgment and thought content.   Assessment and Plan:  Pregnancy: G1P0000 at [redacted]w[redacted]d  1. Supervision of high risk pregnancy in third trimester --No complaints or concerns today --GBS Negative (collected 05/06/18) --GC/C only today  2. Chronic hypertension affecting pregnancy --S/p  hospitalization for visual disturbances in the setting of cHTN --Compliant with medication   Discussed IOL timing with Dr. Ilda Basset. Patient scheduled for IOL 05/27/18 at 0700 @ 37+[redacted] weeks GA 1/thick/-2 today, discussed foley bulb + Cytotec with bridge to Pitocin as needed. Orders placed Confirmed with patient that she is not a candidate for laboring or delivering in water  Preterm labor symptoms and general obstetric precautions including but not limited to vaginal bleeding, contractions, leaking of fluid and fetal movement were reviewed in detail with the patient. Please refer to After Visit Summary for other counseling recommendations.  No follow-ups on file.  Future Appointments  Date Time Provider [place]  05/26/2018  1:45 PM [place] 2 [facility]  05/27/2018  7:00 AM [facility] ROOM [facility] None    Darlina Rumpf, CNM  05/21/18  11:46 AM

## 2018-05-21 NOTE — Patient Instructions (Signed)
Vaginal Delivery Vaginal delivery means that you will give birth by pushing your baby out of your birth canal (vagina). A team of health care providers will help you before, during, and after vaginal delivery. Birth experiences are unique for every woman and every pregnancy, and birth experiences vary depending on where you choose to give birth. What should I do to prepare for my baby's birth? Before your baby is born, it is important to talk with your health care provider about:  Your labor and delivery preferences. These may include: ? Medicines that you may be given. ? How you will manage your pain. This might include non-medical pain relief techniques or injectable pain relief such as epidural analgesia. ? How you and your baby will be monitored during labor and delivery. ? Who may be in the labor and delivery room with you. ? Your feelings about surgical delivery of your baby (cesarean delivery, or C-section) if this becomes necessary. ? Your feelings about receiving donated blood through an IV tube (blood transfusion) if this becomes necessary.  Whether you are able: ? To take pictures or videos of the birth. ? To eat during labor and delivery. ? To move around, walk, or change positions during labor and delivery.  What to expect after your baby is born, such as: ? Whether delayed umbilical cord clamping and cutting is offered. ? Who will care for your baby right after birth. ? Medicines or tests that may be recommended for your baby. ? Whether breastfeeding is supported in your hospital or birth center. ? How long you will be in the hospital or birth center.  How any medical conditions you have may affect your baby or your labor and delivery experience.  To prepare for your baby's birth, you should also:  Attend all of your health care visits before delivery (prenatal visits) as recommended by your health care provider. This is important.  Prepare your home for your baby's  arrival. Make sure that you have: ? Diapers. ? Baby clothing. ? Feeding equipment. ? Safe sleeping arrangements for you and your baby.  Install a car seat in your vehicle. Have your car seat checked by a certified car seat installer to make sure that it is installed safely.  Think about who will help you with your new baby at home for at least the first several weeks after delivery.  What can I expect when I arrive at the birth center or hospital? Once you are in labor and have been admitted into the hospital or birth center, your health care provider may:  Review your pregnancy history and any concerns you have.  Insert an IV tube into one of your veins. This is used to give you fluids and medicines.  Check your blood pressure, pulse, temperature, and heart rate (vital signs).  Check whether your bag of water (amniotic sac) has broken (ruptured).  Talk with you about your birth plan and discuss pain control options.  Monitoring Your health care provider may monitor your contractions (uterine monitoring) and your baby's heart rate (fetal monitoring). You may need to be monitored:  Often, but not continuously (intermittently).  All the time or for long periods at a time (continuously). Continuous monitoring may be needed if: ? You are taking certain medicines, such as medicine to relieve pain or make your contractions stronger. ? You have pregnancy or labor complications.  Monitoring may be done by:  Placing a special stethoscope or a handheld monitoring device on your abdomen to   check your baby's heartbeat, and feeling your abdomen for contractions. This method of monitoring does not continuously record your baby's heartbeat or your contractions.  Placing monitors on your abdomen (external monitors) to record your baby's heartbeat and the frequency and length of contractions. You may not have to wear external monitors all the time.  Placing monitors inside of your uterus  (internal monitors) to record your baby's heartbeat and the frequency, length, and strength of your contractions. ? Your health care provider may use internal monitors if he or she needs more information about the strength of your contractions or your baby's heart rate. ? Internal monitors are put in place by passing a thin, flexible wire through your vagina and into your uterus. Depending on the type of monitor, it may remain in your uterus or on your baby's head until birth. ? Your health care provider will discuss the benefits and risks of internal monitoring with you and will ask for your permission before inserting the monitors.  Telemetry. This is a type of continuous monitoring that can be done with external or internal monitors. Instead of having to stay in bed, you are able to move around during telemetry. Ask your health care provider if telemetry is an option for you.  Physical exam Your health care provider may perform a physical exam. This may include:  Checking whether your baby is positioned: ? With the head toward your vagina (head-down). This is most common. ? With the head toward the top of your uterus (head-up or breech). If your baby is in a breech position, your health care provider may try to turn your baby to a head-down position so you can deliver vaginally. If it does not seem that your baby can be born vaginally, your provider may recommend surgery to deliver your baby. In rare cases, you may be able to deliver vaginally if your baby is head-up (breech delivery). ? Lying sideways (transverse). Babies that are lying sideways cannot be delivered vaginally.  Checking your cervix to determine: ? Whether it is thinning out (effacing). ? Whether it is opening up (dilating). ? How low your baby has moved into your birth canal.  What are the three stages of labor and delivery?  Normal labor and delivery is divided into the following three stages: Stage 1  Stage 1 is the  longest stage of labor, and it can last for hours or days. Stage 1 includes: ? Early labor. This is when contractions may be irregular, or regular and mild. Generally, early labor contractions are more than 10 minutes apart. ? Active labor. This is when contractions get longer, more regular, more frequent, and more intense. ? The transition phase. This is when contractions happen very close together, are very intense, and may last longer than during any other part of labor.  Contractions generally feel mild, infrequent, and irregular at first. They get stronger, more frequent (about every 2-3 minutes), and more regular as you progress from early labor through active labor and transition.  Many women progress through stage 1 naturally, but you may need help to continue making progress. If this happens, your health care provider may talk with you about: ? Rupturing your amniotic sac if it has not ruptured yet. ? Giving you medicine to help make your contractions stronger and more frequent.  Stage 1 ends when your cervix is completely dilated to 4 inches (10 cm) and completely effaced. This happens at the end of the transition phase. Stage 2  Once   your cervix is completely effaced and dilated to 4 inches (10 cm), you may start to feel an urge to push. It is common for the body to naturally take a rest before feeling the urge to push, especially if you received an epidural or certain other pain medicines. This rest period may last for up to 1-2 hours, depending on your unique labor experience.  During stage 2, contractions are generally less painful, because pushing helps relieve contraction pain. Instead of contraction pain, you may feel stretching and burning pain, especially when the widest part of your baby's head passes through the vaginal opening (crowning).  Your health care provider will closely monitor your pushing progress and your baby's progress through the vagina during stage 2.  Your  health care provider may massage the area of skin between your vaginal opening and anus (perineum) or apply warm compresses to your perineum. This helps it stretch as the baby's head starts to crown, which can help prevent perineal tearing. ? In some cases, an incision may be made in your perineum (episiotomy) to allow the baby to pass through the vaginal opening. An episiotomy helps to make the opening of the vagina larger to allow more room for the baby to fit through.  It is very important to breathe and focus so your health care provider can control the delivery of your baby's head. Your health care provider may have you decrease the intensity of your pushing, to help prevent perineal tearing.  After delivery of your baby's head, the shoulders and the rest of the body generally deliver very quickly and without difficulty.  Once your baby is delivered, the umbilical cord may be cut right away, or this may be delayed for 1-2 minutes, depending on your baby's health. This may vary among health care providers, hospitals, and birth centers.  If you and your baby are healthy enough, your baby may be placed on your chest or abdomen to help maintain the baby's temperature and to help you bond with each other. Some mothers and babies start breastfeeding at this time. Your health care team will dry your baby and help keep your baby warm during this time.  Your baby may need immediate care if he or she: ? Showed signs of distress during labor. ? Has a medical condition. ? Was born too early (prematurely). ? Had a bowel movement before birth (meconium). ? Shows signs of difficulty transitioning from being inside the uterus to being outside of the uterus. If you are planning to breastfeed, your health care team will help you begin a feeding. Stage 3  The third stage of labor starts immediately after the birth of your baby and ends after you deliver the placenta. The placenta is an organ that develops  during pregnancy to provide oxygen and nutrients to your baby in the womb.  Delivering the placenta may require some pushing, and you may have mild contractions. Breastfeeding can stimulate contractions to help you deliver the placenta.  After the placenta is delivered, your uterus should tighten (contract) and become firm. This helps to stop bleeding in your uterus. To help your uterus contract and to control bleeding, your health care provider may: ? Give you medicine by injection, through an IV tube, by mouth, or through your rectum (rectally). ? Massage your abdomen or perform a vaginal exam to remove any blood clots that are left in your uterus. ? Empty your bladder by placing a thin, flexible tube (catheter) into your bladder. ? Encourage   you to breastfeed your baby. After labor is over, you and your baby will be monitored closely to ensure that you are both healthy until you are ready to go home. Your health care team will teach you how to care for yourself and your baby. This information is not intended to replace advice given to you by your health care provider. Make sure you discuss any questions you have with your health care provider. Document Released: 03/20/2008 Document Revised: 12/30/2015 Document Reviewed: 06/26/2015 Elsevier Interactive Patient Education  2018 [employer]  

## 2018-05-23 LAB — CERVICOVAGINAL ANCILLARY ONLY
Chlamydia: NEGATIVE
NEISSERIA GONORRHEA: NEGATIVE

## 2018-05-26 ENCOUNTER — Inpatient Hospital Stay (EMERGENCY_DEPARTMENT_HOSPITAL)
Admission: AD | Admit: 2018-05-26 | Discharge: 2018-05-26 | Disposition: A | Discharge status: Home / Self Care | Payer: BLUE CROSS/BLUE SHIELD | Source: Ambulatory Visit | Attending: Obstetrics and Gynecology | Admitting: Obstetrics and Gynecology

## 2018-05-26 ENCOUNTER — Other Ambulatory Visit: Payer: Self-pay

## 2018-05-26 ENCOUNTER — Encounter (HOSPITAL_COMMUNITY): Payer: Self-pay

## 2018-05-26 ENCOUNTER — Encounter (HOSPITAL_COMMUNITY): Payer: Self-pay | Admitting: *Deleted

## 2018-05-26 ENCOUNTER — Ambulatory Visit (HOSPITAL_BASED_OUTPATIENT_CLINIC_OR_DEPARTMENT_OTHER)
Admit: 2018-05-26 | Discharge: 2018-05-26 | Disposition: A | Discharge status: Home / Self Care | Payer: BLUE CROSS/BLUE SHIELD | Attending: Advanced Practice Midwife | Admitting: Advanced Practice Midwife

## 2018-05-26 DIAGNOSIS — O10913 Unspecified pre-existing hypertension complicating pregnancy, third trimester: Secondary | ICD-10-CM

## 2018-05-26 DIAGNOSIS — R7989 Other specified abnormal findings of blood chemistry: Secondary | ICD-10-CM

## 2018-05-26 DIAGNOSIS — R945 Abnormal results of liver function studies: Secondary | ICD-10-CM

## 2018-05-26 DIAGNOSIS — Z3A36 36 weeks gestation of pregnancy: Secondary | ICD-10-CM

## 2018-05-26 DIAGNOSIS — O99213 Obesity complicating pregnancy, third trimester: Secondary | ICD-10-CM | POA: Insufficient documentation

## 2018-05-26 DIAGNOSIS — Z87891 Personal history of nicotine dependence: Secondary | ICD-10-CM | POA: Insufficient documentation

## 2018-05-26 DIAGNOSIS — O10919 Unspecified pre-existing hypertension complicating pregnancy, unspecified trimester: Secondary | ICD-10-CM

## 2018-05-26 DIAGNOSIS — O10013 Pre-existing essential hypertension complicating pregnancy, third trimester: Secondary | ICD-10-CM

## 2018-05-26 DIAGNOSIS — Z3A30 30 weeks gestation of pregnancy: Secondary | ICD-10-CM

## 2018-05-26 DIAGNOSIS — O0993 Supervision of high risk pregnancy, unspecified, third trimester: Secondary | ICD-10-CM

## 2018-05-26 DIAGNOSIS — E66813 Obesity, class 3: Secondary | ICD-10-CM

## 2018-05-26 LAB — CBC
HEMATOCRIT: 37.3 % (ref 36.0–46.0)
Hemoglobin: 11.4 g/dL — ABNORMAL LOW (ref 12.0–15.0)
MCH: 22.8 pg — ABNORMAL LOW (ref 26.0–34.0)
MCHC: 30.6 g/dL (ref 30.0–36.0)
MCV: 74.5 fL — ABNORMAL LOW (ref 80.0–100.0)
Platelets: 201 10*3/uL (ref 150–400)
RBC: 5.01 MIL/uL (ref 3.87–5.11)
RDW: 18.6 % — ABNORMAL HIGH (ref 11.5–15.5)
WBC: 9.7 10*3/uL (ref 4.0–10.5)
nRBC: 0 % (ref 0.0–0.2)

## 2018-05-26 LAB — PROTEIN / CREATININE RATIO, URINE
Creatinine, Urine: 110 mg/dL
PROTEIN CREATININE RATIO: 0.22 mg/mg{creat} — AB (ref 0.00–0.15)
Total Protein, Urine: 24 mg/dL

## 2018-05-26 LAB — COMPREHENSIVE METABOLIC PANEL
ALBUMIN: 2.5 g/dL — AB (ref 3.5–5.0)
ALT: 78 U/L — ABNORMAL HIGH (ref 0–44)
AST: 44 U/L — ABNORMAL HIGH (ref 15–41)
Alkaline Phosphatase: 316 U/L — ABNORMAL HIGH (ref 38–126)
Anion gap: 9 (ref 5–15)
BILIRUBIN TOTAL: 0.2 mg/dL — AB (ref 0.3–1.2)
BUN: 7 mg/dL (ref 6–20)
CO2: 18 mmol/L — ABNORMAL LOW (ref 22–32)
Calcium: 8.6 mg/dL — ABNORMAL LOW (ref 8.9–10.3)
Chloride: 106 mmol/L (ref 98–111)
Creatinine, Ser: 0.37 mg/dL — ABNORMAL LOW (ref 0.44–1.00)
GFR calc Af Amer: >60 mL/min (ref >60)
GFR calc non Af Amer: >60 mL/min (ref >60)
Glucose, Bld: 115 mg/dL — ABNORMAL HIGH (ref 70–99)
Potassium: 4 mmol/L (ref 3.5–5.1)
Sodium: 133 mmol/L — ABNORMAL LOW (ref 135–145)
Total Protein: 6.7 g/dL (ref 6.5–8.1)

## 2018-05-26 NOTE — [facility] Note (Signed)
Last took Procardia at 1000, takes once a day.  Sent over from [facility] for Pre-e eval. Denies HA, visual changes or epigastric pain, does report increased swelling in feet. Denies leaking or bleeding.

## 2018-05-26 NOTE — Discharge Instructions (Signed)
Hypertension During Pregnancy °Hypertension, commonly called high blood pressure, is when the force of blood pumping through your arteries is too strong. Arteries are blood vessels that carry blood from the heart throughout the body. Hypertension during pregnancy can cause problems for you and your baby. Your baby may be born early (prematurely) or may not weigh as much as he or she should at birth. Very bad cases of hypertension during pregnancy can be life-threatening. °Different types of hypertension can occur during pregnancy. These include: °· Chronic hypertension. This happens when: °? You have hypertension before pregnancy and it continues during pregnancy. °? You develop hypertension before you are [redacted] weeks pregnant, and it continues during pregnancy. °· Gestational hypertension. This is hypertension that develops after the 20th week of pregnancy. °· Preeclampsia, also called toxemia of pregnancy. This is a very serious type of hypertension that develops only during pregnancy. It affects the whole body, and it can be very dangerous for you and your baby. ° °Gestational hypertension and preeclampsia usually go away within 6 weeks after your baby is born. Women who have hypertension during pregnancy have a greater chance of developing hypertension later in life or during future pregnancies. °What are the causes? °The exact cause of hypertension is not known. °What increases the risk? °There are certain factors that make it more likely for you to develop hypertension during pregnancy. These include: °· Having hypertension during a previous pregnancy or prior to pregnancy. °· Being overweight. °· Being older than age 40. °· Being pregnant for the first time or being pregnant with more than one baby. °· Becoming pregnant using fertilization methods such as IVF (in vitro fertilization). °· Having diabetes, kidney problems, or systemic lupus erythematosus. °· Having a family history of hypertension. ° °What are the  signs or symptoms? °Chronic hypertension and gestational hypertension rarely cause symptoms. Preeclampsia causes symptoms, which may include: °· Increased protein in your urine. Your health care provider will check for this at every visit before you give birth (prenatal visit). °· Severe headaches. °· Sudden weight gain. °· Swelling of the hands, face, legs, and feet. °· Nausea and vomiting. °· Vision problems, such as blurred or double vision. °· Numbness in the face, arms, legs, and feet. °· Dizziness. °· Slurred speech. °· Sensitivity to bright lights. °· Abdominal pain. °· Convulsions. ° °How is this diagnosed? °You may be diagnosed with hypertension during a routine prenatal exam. At each prenatal visit, you may: °· Have a urine test to check for high amounts of protein in your urine. °· Have your blood pressure checked. A blood pressure reading is recorded as two numbers, such as "120 over 80" (or 120/80). The first ("top") number is called the systolic pressure. It is a measure of the pressure in your arteries when your heart beats. The second ("bottom") number is called the diastolic pressure. It is a measure of the pressure in your arteries as your heart relaxes between beats. Blood pressure is measured in a unit called mm Hg. A normal blood pressure reading is: °? Systolic: below 120. °? Diastolic: below 80. ° °The type of hypertension that you are diagnosed with depends on your test results and when your symptoms developed. °· Chronic hypertension is usually diagnosed before 20 weeks of pregnancy. °· Gestational hypertension is usually diagnosed after 20 weeks of pregnancy. °· Hypertension with high amounts of protein in the urine is diagnosed as preeclampsia. °· Blood pressure measurements that stay above 160 systolic, or above 110 diastolic, are   signs of severe preeclampsia.  How is this treated? Treatment for hypertension during pregnancy varies depending on the type of hypertension you have and how  serious it is.  If you take medicines called ACE inhibitors to treat chronic hypertension, you may need to switch medicines. ACE inhibitors should not be taken during pregnancy.  If you have gestational hypertension, you may need to take blood pressure medicine.  If you are at risk for preeclampsia, your health care provider may recommend that you take a low-dose aspirin every day to prevent high blood pressure during your pregnancy.  If you have severe preeclampsia, you may need to be hospitalized so you and your baby can be monitored closely. You may also need to take medicine (magnesium sulfate) to prevent seizures and to lower blood pressure. This medicine may be given as an injection or through an IV tube.  In some cases, if your condition gets worse, you may need to deliver your baby early.  Follow these instructions at home: Eating and drinking  Drink enough fluid to keep your urine clear or pale yellow.  Eat a healthy diet that is low in salt (sodium). Do not add salt to your food. Check food labels to see how much sodium a food or beverage contains. Lifestyle  Do not use any products that contain nicotine or tobacco, such as cigarettes and e-cigarettes. If you need help quitting, ask your health care provider.  Do not use alcohol.  Avoid caffeine.  Avoid stress as much as possible. Rest and get plenty of sleep. General instructions  Take over-the-counter and prescription medicines only as told by your health care provider.  While lying down, lie on your left side. This keeps pressure off your baby.  While sitting or lying down, raise (elevate) your feet. Try putting some pillows under your lower legs.  Exercise regularly. Ask your health care provider what kinds of exercise are best for you.  Keep all prenatal and follow-up visits as told by your health care provider. This is important. Contact a health care provider if:  You have symptoms that your health care  provider told you may require more treatment or monitoring, such as: ? Fever. ? Vomiting. ? Headache. Get help right away if:  You have severe abdominal pain or vomiting that does not get better with treatment.  You suddenly develop swelling in your hands, ankles, or face.  You gain 4 lbs (1.8 kg) or more in 1 week.  You develop vaginal bleeding, or you have blood in your urine.  You do not feel your baby moving as much as usual.  You have blurred or double vision.  You have muscle twitching or sudden tightening (spasms).  You have shortness of breath.  Your lips or fingernails turn blue. This information is not intended to replace advice given to you by your health care provider. Make sure you discuss any questions you have with your health care provider. Document Released: 02/27/2011 Document Revised: 12/30/2015 Document Reviewed: 11/25/2015 Elsevier Interactive Patient Education  2018 [employer].  Fetal Movement Counts Patient Name: ________________________________________________ Patient Due Date: ____________________ What is a fetal movement count? A fetal movement count is the number of times that you feel your baby move during a certain amount of time. This may also be called a fetal kick count. A fetal movement count is recommended for every pregnant woman. You may be asked to start counting fetal movements as early as week 28 of your pregnancy. Pay attention to  when your baby is most active. You may notice your baby's sleep and wake cycles. You may also notice things that make your baby move more. You should do a fetal movement count:  When your baby is normally most active.  At the same time each day.  A good time to count movements is while you are resting, after having something to eat and drink. How do I count fetal movements? 1. Find a quiet, comfortable area. Sit, or lie down on your side. 2. Write down the date, the start time and stop time, and the number  of movements that you felt between those two times. Take this information with you to your health care visits. 3. For 2 hours, count kicks, flutters, swishes, rolls, and jabs. You should feel at least 10 movements during 2 hours. 4. You may stop counting after you have felt 10 movements. 5. If you do not feel 10 movements in 2 hours, have something to eat and drink. Then, keep resting and counting for 1 hour. If you feel at least 4 movements during that hour, you may stop counting. Contact a health care provider if:  You feel fewer than 4 movements in 2 hours.  Your baby is not moving like he or she usually does. Date: ____________ Start time: ____________ Stop time: ____________ Movements: ____________ Date: ____________ Start time: ____________ Stop time: ____________ Movements: ____________ Date: ____________ Start time: ____________ Stop time: ____________ Movements: ____________ Date: ____________ Start time: ____________ Stop time: ____________ Movements: ____________ Date: ____________ Start time: ____________ Stop time: ____________ Movements: ____________ Date: ____________ Start time: ____________ Stop time: ____________ Movements: ____________ Date: ____________ Start time: ____________ Stop time: ____________ Movements: ____________ Date: ____________ Start time: ____________ Stop time: ____________ Movements: ____________ Date: ____________ Start time: ____________ Stop time: ____________ Movements: ____________ This information is not intended to replace advice given to you by your health care provider. Make sure you discuss any questions you have with your health care provider. Document Released: 07/11/2006 Document Revised: 02/08/2016 Document Reviewed: 07/21/2015 Elsevier Interactive Patient Education  [employer].

## 2018-05-26 NOTE — MAU Provider Note (Addendum)
Chief Complaint:  Hypertension   First Provider Initiated Contact with Patient 05/26/18 1605     HPI: Latoya Mora is a 22 y.o. G1P0000 at [redacted]w[redacted]d who presents to maternity admissions from MFM for BP evaluation. Patient with CHTN. Takes procardia 60 mg per day; took dose this morning. Was in MFM today for BPP & had 2 severe range BPs. Pt is scheduled for IOL tomorrow morning.  Denies headache, visual disturbance, or epigastric pain.   Denies contractions, leakage of fluid or vaginal bleeding. Good fetal movement.    Past Medical History:  Diagnosis Date  . Allergy   . Anxiety   . Depression   . Elevated liver enzymes   . Frequent headaches   . Hypertension   . PCOS (polycystic ovarian syndrome)    OB History  Gravida Para Term Preterm AB Living  1 0 0 0 0 0  SAB TAB Ectopic Multiple Live Births  0 0 0 0 0    # Outcome Date GA Lbr Len/2nd Weight Sex Delivery Anes PTL Lv  1 Current            Past Surgical History:  Procedure Laterality Date  . TOOTH EXTRACTION     Family History  Problem Relation Age of Onset  . Alcohol abuse Mother   . Drug abuse Mother   . Depression Mother   . Anxiety disorder Mother   . Heart disease Mother   . Skin cancer Mother   . Scoliosis Mother   . Alcohol abuse Father   . Bipolar disorder Father   . Depression Father   . Osteochondroma Father   . COPD Maternal Grandmother   . Scoliosis Maternal Grandmother   . COPD Maternal Grandfather   . Skin cancer Maternal Grandfather   . Arthritis Paternal Grandmother   . Bipolar disorder Paternal Grandfather   . Osteochondroma Sister   . Osteochondroma Brother   . Cancer Neg Hx    Social History   Tobacco Use  . Smoking status: Former Research scientist (life sciences)  . Smokeless tobacco: Never Used  . Tobacco comment: quit 11/12/2010  only a few times in high school  Substance Use Topics  . Alcohol use: Not Currently  . Drug use: No   Allergies  Allergen Reactions  . Etodolac Nausea Only  . Meloxicam Other  (See Comments)    Abdominal pain  . Flexeril [Cyclobenzaprine] Nausea Only  . Nsaids Other (See Comments)    Stomach pain  . Prozac [Fluoxetine Hcl] Other (See Comments)    Felt bad/awful   Medications Prior to Admission  Medication Sig Dispense Refill Last Dose  . calcium carbonate (TUMS - DOSED IN MG ELEMENTAL CALCIUM) 500 MG chewable tablet Chew 2 tablets by mouth 4 (four) times daily as needed for indigestion or heartburn.   05/26/2018 at Unknown time  . hydrOXYzine (ATARAX/VISTARIL) 25 MG tablet Take 1-2 tablets (25-50 mg total) by mouth every 6 (six) hours as needed for anxiety. 30 tablet 2 Past Month at Unknown time  . NIFEdipine (ADALAT CC) 60 MG 24 hr tablet Take 1 tablet (60 mg total) by mouth daily. 30 tablet 0 05/26/2018 at Unknown time  . nitrofurantoin, macrocrystal-monohydrate, (MACROBID) 100 MG capsule Take 100 mg by mouth daily.    Past Week at Unknown time  . Prenatal Vit-Fe Fumarate-FA (PREPLUS) 27-1 MG TABS Take 1 tablet by mouth daily. 30 tablet 6 05/26/2018 at Unknown time  . Elastic Bandages & Supports (COMFORT FIT MATERNITY SUPP LG) MISC 1 Units by  Does not apply route daily as needed. 1 each 0 Taking    I have reviewed patient's Past Medical Hx, Surgical Hx, Family Hx, Social Hx, medications and allergies.   ROS:  Review of Systems  Constitutional: Negative.   Eyes: Negative for visual disturbance.  Cardiovascular: Positive for leg swelling.  Gastrointestinal: Negative.   Genitourinary: Negative.   Neurological: Negative for headaches.    Physical Exam   Patient Vitals for the past 24 hrs:  BP Temp Temp src Pulse Resp SpO2  05/26/18 1715 138/87 - - (!) 104 - -  05/26/18 1701 132/90 - - (!) 105 - -  05/26/18 1645 125/81 - - (!) 104 - -  05/26/18 1630 128/87 - - (!) 103 - -  05/26/18 1615 (!) 143/95 - - (!) 109 - -  05/26/18 1600 (!) 141/94 - - (!) 108 - -  05/26/18 1535 (!) 144/102 98.2 F (36.8 C) Oral (!) 112 18 100 %    Constitutional:  Well-developed, well-nourished female in no acute distress.  Cardiovascular: normal rate & rhythm, no murmur Respiratory: normal effort, lung sounds clear throughout GI: Abd soft, non-tender, gravid appropriate for gestational age. Pos BS x 4 MS: Extremities nontender, mild non pitting edema, normal ROM Neurologic: Alert and oriented x 4. Patellar DTRs 2+ bilat, no clonus  NST:  Baseline: 145 bpm, Variability: Good {> 6 bpm), Accelerations: Reactive and Decelerations: Absent   Labs: Results for orders placed or performed during the hospital encounter of 05/26/18 (from the past 24 hour(s))  CBC     Status: Abnormal   Collection Time: 05/26/18  4:04 PM  Result Value Ref Range   WBC 9.7 4.0 - 10.5 K/uL   RBC 5.01 3.87 - 5.11 MIL/uL   Hemoglobin 11.4 (L) 12.0 - 15.0 g/dL   HCT 37.3 36.0 - 46.0 %   MCV 74.5 (L) 80.0 - 100.0 fL   MCH 22.8 (L) 26.0 - 34.0 pg   MCHC 30.6 30.0 - 36.0 g/dL   RDW 18.6 (H) 11.5 - 15.5 %   Platelets 201 150 - 400 K/uL   nRBC 0.0 0.0 - 0.2 %  Comprehensive metabolic panel     Status: Abnormal   Collection Time: 05/26/18  4:04 PM  Result Value Ref Range   Sodium 133 (L) 135 - 145 mmol/L   Potassium 4.0 3.5 - 5.1 mmol/L   Chloride 106 98 - 111 mmol/L   CO2 18 (L) 22 - 32 mmol/L   Glucose, Bld 115 (H) 70 - 99 mg/dL   BUN 7 6 - 20 mg/dL   Creatinine, Ser 0.37 (L) 0.44 - 1.00 mg/dL   Calcium 8.6 (L) 8.9 - 10.3 mg/dL   Total Protein 6.7 6.5 - 8.1 g/dL   Albumin 2.5 (L) 3.5 - 5.0 g/dL   AST 44 (H) 15 - 41 U/L   ALT 78 (H) 0 - 44 U/L   Alkaline Phosphatase 316 (H) 38 - 126 U/L   Total Bilirubin 0.2 (L) 0.3 - 1.2 mg/dL   GFR calc non Af Amer >60 >60 mL/min   GFR calc Af Amer >60 >60 mL/min   Anion gap 9 5 - 15    :    MAU Course: Orders Placed This Encounter  Procedures  . CBC  . Comprehensive metabolic panel  . Protein / creatinine ratio, urine   No orders of the defined types were placed in this encounter.   MDM: BPs being cycled. None severe  range in MAU. Pt  asymptomatic Reactive NST PEC labs normal Reviewed with Dr. Ilda Basset. Pt to be discharged & will return in the morning for IOL.    A: 1. Preexisting hypertension complicating pregnancy, antepartum   2. Abnormal liver function tests   3. [redacted] weeks gestation of pregnancy     P: Discharge home in stable condition Discussed reasons to return to MAU  Return tomorrow morning for scheduled induction  Jorje Guild, NP

## 2018-05-26 NOTE — ED Notes (Signed)
Dr. Donalee Citrin called report to Dr. Ilda Basset. Dr. Ilda Basset requests that patient go to MAU for further evaluation due to increased BP's today and recent hx of elevated LFT's. Report called to Elmyra Ricks, RN in MAU. Patient escorted to MAU registration.

## 2018-05-27 ENCOUNTER — Inpatient Hospital Stay (HOSPITAL_COMMUNITY): Payer: BLUE CROSS/BLUE SHIELD | Admitting: Anesthesiology

## 2018-05-27 ENCOUNTER — Encounter (HOSPITAL_COMMUNITY): Payer: Self-pay

## 2018-05-27 ENCOUNTER — Inpatient Hospital Stay (HOSPITAL_COMMUNITY)
Admission: RE | Admit: 2018-05-27 | Discharge: 2018-05-29 | DRG: 807 | Disposition: A | Discharge status: Home / Self Care | Payer: BLUE CROSS/BLUE SHIELD | Attending: Family Medicine | Admitting: Family Medicine

## 2018-05-27 DIAGNOSIS — O10913 Unspecified pre-existing hypertension complicating pregnancy, third trimester: Secondary | ICD-10-CM | POA: Diagnosis not present

## 2018-05-27 DIAGNOSIS — Z3A36 36 weeks gestation of pregnancy: Secondary | ICD-10-CM | POA: Diagnosis not present

## 2018-05-27 DIAGNOSIS — Z3A37 37 weeks gestation of pregnancy: Secondary | ICD-10-CM | POA: Diagnosis not present

## 2018-05-27 DIAGNOSIS — R945 Abnormal results of liver function studies: Secondary | ICD-10-CM | POA: Diagnosis not present

## 2018-05-27 DIAGNOSIS — O3660X0 Maternal care for excessive fetal growth, unspecified trimester, not applicable or unspecified: Secondary | ICD-10-CM | POA: Diagnosis present

## 2018-05-27 DIAGNOSIS — O99214 Obesity complicating childbirth: Secondary | ICD-10-CM | POA: Diagnosis present

## 2018-05-27 DIAGNOSIS — O0993 Supervision of high risk pregnancy, unspecified, third trimester: Secondary | ICD-10-CM

## 2018-05-27 DIAGNOSIS — O99344 Other mental disorders complicating childbirth: Secondary | ICD-10-CM | POA: Diagnosis present

## 2018-05-27 DIAGNOSIS — O1002 Pre-existing essential hypertension complicating childbirth: Secondary | ICD-10-CM | POA: Diagnosis present

## 2018-05-27 DIAGNOSIS — F329 Major depressive disorder, single episode, unspecified: Secondary | ICD-10-CM | POA: Diagnosis present

## 2018-05-27 DIAGNOSIS — O10919 Unspecified pre-existing hypertension complicating pregnancy, unspecified trimester: Secondary | ICD-10-CM | POA: Diagnosis present

## 2018-05-27 DIAGNOSIS — F32A Depression, unspecified: Secondary | ICD-10-CM | POA: Diagnosis present

## 2018-05-27 DIAGNOSIS — R740 Nonspecific elevation of levels of transaminase and lactic acid dehydrogenase [LDH]: Secondary | ICD-10-CM | POA: Diagnosis present

## 2018-05-27 DIAGNOSIS — F419 Anxiety disorder, unspecified: Secondary | ICD-10-CM | POA: Diagnosis present

## 2018-05-27 DIAGNOSIS — R7989 Other specified abnormal findings of blood chemistry: Secondary | ICD-10-CM | POA: Diagnosis present

## 2018-05-27 DIAGNOSIS — O134 Gestational [pregnancy-induced] hypertension without significant proteinuria, complicating childbirth: Secondary | ICD-10-CM | POA: Diagnosis not present

## 2018-05-27 DIAGNOSIS — O169 Unspecified maternal hypertension, unspecified trimester: Secondary | ICD-10-CM | POA: Diagnosis present

## 2018-05-27 DIAGNOSIS — O0990 Supervision of high risk pregnancy, unspecified, unspecified trimester: Secondary | ICD-10-CM

## 2018-05-27 LAB — TYPE AND SCREEN
ABO/RH(D): B POS
Antibody Screen: NEGATIVE

## 2018-05-27 LAB — CBC
HCT: 37.4 % (ref 36.0–46.0)
Hemoglobin: 11.5 g/dL — ABNORMAL LOW (ref 12.0–15.0)
MCH: 22.9 pg — ABNORMAL LOW (ref 26.0–34.0)
MCHC: 30.7 g/dL (ref 30.0–36.0)
MCV: 74.4 fL — ABNORMAL LOW (ref 80.0–100.0)
PLATELETS: 212 10*3/uL (ref 150–400)
RBC: 5.03 MIL/uL (ref 3.87–5.11)
RDW: 18.6 % — AB (ref 11.5–15.5)
WBC: 8.4 10*3/uL (ref 4.0–10.5)
nRBC: 0 % (ref 0.0–0.2)

## 2018-05-27 LAB — GLUCOSE, CAPILLARY: Glucose-Capillary: 100 mg/dL — ABNORMAL HIGH (ref 70–99)

## 2018-05-27 MED ORDER — LIDOCAINE HCL (PF) 1 % IJ SOLN
30.0000 mL | INTRAMUSCULAR | Status: DC | PRN
  Filled 2018-05-27: qty 30

## 2018-05-27 MED ORDER — DIPHENHYDRAMINE HCL 50 MG/ML IJ SOLN: 12.5000 mg | INTRAMUSCULAR | Status: DC | PRN

## 2018-05-27 MED ORDER — OXYTOCIN BOLUS FROM INFUSION
500.0000 mL | Freq: Once | INTRAVENOUS | Status: AC
  Administered 2018-05-28: 500 mL via INTRAVENOUS

## 2018-05-27 MED ORDER — EPHEDRINE 5 MG/ML INJ
10.0000 mg | INTRAVENOUS | Status: DC | PRN
  Filled 2018-05-27: qty 2

## 2018-05-27 MED ORDER — OXYCODONE-ACETAMINOPHEN 5-325 MG PO TABS: 2.0000 | ORAL_TABLET | ORAL | Status: DC | PRN

## 2018-05-27 MED ORDER — LIDOCAINE HCL (PF) 1 % IJ SOLN
INTRAMUSCULAR | Status: DC | PRN
  Administered 2018-05-27 (×2): 6 mL via EPIDURAL

## 2018-05-27 MED ORDER — ONDANSETRON HCL 4 MG/2ML IJ SOLN: 4.0000 mg | Freq: Four times a day (QID) | INTRAMUSCULAR | Status: DC | PRN

## 2018-05-27 MED ORDER — NIFEDIPINE ER OSMOTIC RELEASE 30 MG PO TB24
60.0000 mg | ORAL_TABLET | Freq: Every day | ORAL | Status: DC
  Administered 2018-05-27: 60 mg via ORAL
  Filled 2018-05-27 (×2): qty 1

## 2018-05-27 MED ORDER — LACTATED RINGERS IV SOLN
INTRAVENOUS | Status: DC
  Administered 2018-05-27 (×3): via INTRAVENOUS

## 2018-05-27 MED ORDER — TERBUTALINE SULFATE 1 MG/ML IJ SOLN
0.2500 mg | Freq: Once | INTRAMUSCULAR | Status: DC | PRN
  Filled 2018-05-27: qty 1

## 2018-05-27 MED ORDER — LACTATED RINGERS IV SOLN: 500.0000 mL | INTRAVENOUS | Status: DC | PRN

## 2018-05-27 MED ORDER — ACETAMINOPHEN 325 MG PO TABS
650.0000 mg | ORAL_TABLET | ORAL | Status: DC | PRN
  Administered 2018-05-28: 650 mg via ORAL
  Filled 2018-05-27: qty 2

## 2018-05-27 MED ORDER — OXYTOCIN 40 UNITS IN LACTATED RINGERS INFUSION - SIMPLE MED
1.0000 m[IU]/min | INTRAVENOUS | Status: DC
  Administered 2018-05-27: 2 m[IU]/min via INTRAVENOUS
  Filled 2018-05-27: qty 1000

## 2018-05-27 MED ORDER — FENTANYL CITRATE (PF) 100 MCG/2ML IJ SOLN
100.0000 ug | INTRAMUSCULAR | Status: DC | PRN
  Administered 2018-05-27: 100 ug via INTRAVENOUS

## 2018-05-27 MED ORDER — FENTANYL 2.5 MCG/ML BUPIVACAINE 1/10 % EPIDURAL INFUSION (WH - ANES)
14.0000 mL/h | INTRAMUSCULAR | Status: DC | PRN
  Administered 2018-05-27 (×2): 14 mL/h via EPIDURAL
  Filled 2018-05-27 (×2): qty 100

## 2018-05-27 MED ORDER — OXYTOCIN 40 UNITS IN LACTATED RINGERS INFUSION - SIMPLE MED: 2.5000 [IU]/h | INTRAVENOUS | Status: DC

## 2018-05-27 MED ORDER — PHENYLEPHRINE 40 MCG/ML (10ML) SYRINGE FOR IV PUSH (FOR BLOOD PRESSURE SUPPORT)
80.0000 ug | PREFILLED_SYRINGE | INTRAVENOUS | Status: DC | PRN
  Filled 2018-05-27: qty 5
  Filled 2018-05-27: qty 10

## 2018-05-27 MED ORDER — TERBUTALINE SULFATE 1 MG/ML IJ SOLN: 0.2500 mg | Freq: Once | INTRAMUSCULAR | Status: DC | PRN

## 2018-05-27 MED ORDER — LACTATED RINGERS IV SOLN: 500.0000 mL | Freq: Once | INTRAVENOUS | Status: DC

## 2018-05-27 MED ORDER — PHENYLEPHRINE 40 MCG/ML (10ML) SYRINGE FOR IV PUSH (FOR BLOOD PRESSURE SUPPORT)
80.0000 ug | PREFILLED_SYRINGE | INTRAVENOUS | Status: DC | PRN
  Filled 2018-05-27: qty 5

## 2018-05-27 MED ORDER — MISOPROSTOL 50MCG HALF TABLET
50.0000 ug | ORAL_TABLET | ORAL | Status: DC
  Administered 2018-05-27: 50 ug via BUCCAL
  Filled 2018-05-27 (×4): qty 1

## 2018-05-27 MED ORDER — SOD CITRATE-CITRIC ACID 500-334 MG/5ML PO SOLN
30.0000 mL | ORAL | Status: DC | PRN
  Administered 2018-05-27: 30 mL via ORAL
  Filled 2018-05-27: qty 15

## 2018-05-27 MED ORDER — FENTANYL CITRATE (PF) 100 MCG/2ML IJ SOLN
INTRAMUSCULAR | Status: AC
  Filled 2018-05-27: qty 2

## 2018-05-27 MED ORDER — OXYCODONE-ACETAMINOPHEN 5-325 MG PO TABS: 1.0000 | ORAL_TABLET | ORAL | Status: DC | PRN

## 2018-05-27 NOTE — Progress Notes (Signed)
Labor Progress Note Latoya Mora is a 22 y.o. G1P0000 at [redacted]w[redacted]d presented for IOL for CHTN  S:  Comfortable. Not feeling ctx.  O:  BP (!) 159/96   Pulse (!) 106   Temp 98.8 F (37.1 C) (Oral)   Resp 18   Ht 5\' 8"  (1.727 m)   Wt (!) 146.6 kg   LMP 09/10/2017 (LMP Unknown)   BMI 49.14 kg/m  EFM: baseline 140 bpm/ mod variability/ + accels/ no decels  Toco: irritability SVE: 1.5/60 FB placed, tolerated well  A/P: 22 y.o. G1P0000 [redacted]w[redacted]d  1. Labor: latent 2. CHTN: will restart Procardia (home med) but treat with IV and Mg if becomes severe range 3. FWB: Cat I 4. Pain: analgesia/anesthesia prn  LGA, continue CBG q4, attempt to obtain GTT result from P4W.  Continue Cytotec. Pitocin once FB out. Anticipate labor progress and SVD.  Julianne Handler, CNM 10:33 AM

## 2018-05-27 NOTE — Progress Notes (Addendum)
Labor Progress Note Latoya Mora is a 22 y.o. G1P0000 at [redacted]w[redacted]d presented for IOL for CHTN  S:  Feeling some cramping.  O:  BP (!) 111/91 (BP Location: Right Arm)   Pulse 100   Temp 98.8 F (37.1 C) (Oral)   Resp 18   Ht 5\' 8"  (1.727 m)   Wt (!) 146.6 kg   LMP 09/10/2017 (LMP Unknown)   BMI 49.14 kg/m  EFM: baseline 145 bpm/ mod variability/ + accels/ no decels  Toco: 2-4 SVE: FB repositioned in front of fetal head  A/P: 22 y.o. G1P0000 [redacted]w[redacted]d  1. Labor: latent 2. FWB: Cat I 3. Pain: analgesia prn  Continue Cytotec. Obtained records for GTT in 3rd trimester>normal, will discontinue CBGs.  Julianne Handler, CNM 2:17 PM

## 2018-05-27 NOTE — Progress Notes (Signed)
Patient ID: Latoya Mora, female   DOB: [birth year], 22 y.o.   MRN: [id number] Latoya Mora is a 22 y.o. G1P0000 at [redacted]w[redacted]d admitted for induction of labor due to [facility], elevated LFTs. Suspected LGA, EFW >90% yesterday  Subjective: Comfortable w/ epidural, Denies ha, visual changes, ruq/epigastric pain, n/v.    Objective: BP (!) 142/80   Pulse 100   Temp 98.4 F (36.9 C) (Oral)   Resp 18   Ht 5\' 8"  (1.727 m)   Wt (!) 146.6 kg   LMP 09/10/2017 (LMP Unknown)   BMI 49.14 kg/m  Total I/O In: -  Out: 400 [Urine:400]  FHT:  FHR: 150 bpm, variability: moderate,  accelerations:  Present,  decelerations:  Absent UC:   regular, every 2-3 minutes AROM mod amt clear fluid IUPC placed to trace uc's better (unable to trace when pt on side)  SVE:   Dilation: 6 Effacement (%): 80 Station: -2 Exam by:: Currie Dennin, CNM   Pitocin @ 6 mu/min  Labs: Lab Results  Component Value Date   WBC 8.4 05/27/2018   HGB 11.5 (L) 05/27/2018   HCT 37.4 05/27/2018   MCV 74.4 (L) 05/27/2018   PLT 212 05/27/2018    Assessment / Plan: Induction of labor due to [facility],  progressing well on pitocin, now AROM'd  Labor: Progressing normally Fetal Wellbeing:  Category I Pain Control:  Epidural Pre-eclampsia: elevated LFTs since Oct I/D:  n/a Anticipated MOD:  NSVD  [name] CNM, WHNP-BC 05/27/2018, 10:28 PM

## 2018-05-27 NOTE — Anesthesia Preprocedure Evaluation (Signed)
Anesthesia Evaluation  Patient identified by MRN, date of birth, ID band Patient awake    Reviewed: Allergy & Precautions, H&P , NPO status , Patient's Chart, lab work & pertinent test results  Airway Mallampati: III  TM Distance: >3 FB Neck ROM: full    Dental no notable dental hx. (+) Teeth Intact   Pulmonary neg pulmonary ROS,    Pulmonary exam normal breath sounds clear to auscultation       Cardiovascular hypertension, Pt. on medications negative cardio ROS Normal cardiovascular exam Rhythm:regular Rate:Normal     Neuro/Psych Depression    GI/Hepatic negative GI ROS, Neg liver ROS,   Endo/Other  Morbid obesity  Renal/GU negative Renal ROS     Musculoskeletal   Abdominal (+) + obese,   Peds  Hematology negative hematology ROS (+)   Anesthesia Other Findings   Reproductive/Obstetrics (+) Pregnancy                             Anesthesia Physical Anesthesia Plan  ASA: III  Anesthesia Plan: Epidural   Post-op Pain Management:    Induction:   PONV Risk Score and Plan:   Airway Management Planned:   Additional Equipment:   Intra-op Plan:   Post-operative Plan:   Informed Consent: I have reviewed the patients History and Physical, chart, labs and discussed the procedure including the risks, benefits and alternatives for the proposed anesthesia with the patient or authorized representative who has indicated his/her understanding and acceptance.     Plan Discussed with:   Anesthesia Plan Comments:         Anesthesia Quick Evaluation

## 2018-05-27 NOTE — Anesthesia Procedure Notes (Signed)
Epidural Patient location during procedure: OB Start time: 05/27/2018 4:24 PM End time: 05/27/2018 4:28 PM  Staffing Performed: anesthesiologist   Preanesthetic Checklist Completed: patient identified, site marked, surgical consent, pre-op evaluation, timeout performed, IV checked, risks and benefits discussed and monitors and equipment checked  Epidural Patient position: sitting Prep: site prepped and draped and DuraPrep Patient monitoring: continuous pulse ox and blood pressure Approach: midline Location: L3-L4 Injection technique: LOR air  Needle:  Needle type: Tuohy  Needle gauge: 17 G Needle length: 9 cm and 9 Needle insertion depth: 9 cm Catheter type: closed end flexible Catheter size: 19 Gauge Catheter at skin depth: 15 cm Test dose: negative and Other  Assessment Sensory level: T9 Events: blood not aspirated, injection not painful, no injection resistance, negative IV test and no paresthesia  Additional Notes Reason for block:procedure for pain

## 2018-05-27 NOTE — H&P (Addendum)
LABOR AND DELIVERY ADMISSION HISTORY AND PHYSICAL NOTE  Latoya Mora is a 22 y.o. female G1P0000 with IUP at [redacted]w[redacted]d by LMP presenting for IOL in the setting of cHTN and elevated transaminases.  Pre-eclampsia rule-out at 34+6 suspicious for labetalol liver toxicity with possible role in transaminases.  Reports negative 2hr GTT x2 in first trimester and early third trimester, however records from later GTT are missing. Negative GTT in may 2019. She reports positive fetal movement. She denies leakage of fluid or vaginal bleeding. Partner at bedside, supportive.  Prenatal History/Complications: PNC at [facility], [facility], and [facility]. Pregnancy complications: cHTN, transaminitis, multiple admissions for rule-out pre-eclampsia with neonatal consults determined to be with sufficient improvement in labs and symptoms to delay induction to 37 weeks.  Past Medical History: Past Medical History:  Diagnosis Date  . Allergy   . Anxiety   . Depression   . Elevated liver enzymes   . Frequent headaches   . Hypertension   . PCOS (polycystic ovarian syndrome)     Past Surgical History: Past Surgical History:  Procedure Laterality Date  . TOOTH EXTRACTION      Obstetrical History: OB History    Gravida  1   Para  0   Term  0   Preterm  0   AB  0   Living  0     SAB  0   TAB  0   Ectopic  0   Multiple  0   Live Births  0           Social History: Social History   Socioeconomic History  . Marital status: Married    Spouse name: Aaira Oestreicher  . Number of children: Not on file  . Years of education: Not on file  . Highest education level: Not on file  Occupational History  . Not on file  Social Needs  . Financial resource strain: Not hard at all  . Food insecurity:    Worry: Never true    Inability: Never true  . Transportation needs:    Medical: No    Non-medical: Not on file  Tobacco Use  . Smoking status: Never Smoker  . Smokeless tobacco:  Never Used  Substance and Sexual Activity  . Alcohol use: Not Currently  . Drug use: No  . Sexual activity: Yes    Birth control/protection: None  Lifestyle  . Physical activity:    Days per week: Not on file    Minutes per session: Not on file  . Stress: To some extent  Relationships  . Social connections:    Talks on phone: Not on file    Gets together: Not on file    Attends religious service: Not on file    Active member of club or organization: Not on file    Attends meetings of clubs or organizations: Not on file    Relationship status: Not on file  Other Topics Concern  . Not on file  Social History Narrative  . Not on file    Family History: Family History  Problem Relation Age of Onset  . Alcohol abuse Mother   . Drug abuse Mother   . Depression Mother   . Anxiety disorder Mother   . Heart disease Mother   . Skin cancer Mother   . Scoliosis Mother   . Alcohol abuse Father   . Bipolar disorder Father   . Depression Father   . Osteochondroma Father   . COPD  Maternal Grandmother   . Scoliosis Maternal Grandmother   . COPD Maternal Grandfather   . Skin cancer Maternal Grandfather   . Arthritis Paternal Grandmother   . Bipolar disorder Paternal Grandfather   . Osteochondroma Sister   . Osteochondroma Brother   . Cancer Neg Hx     Allergies: Allergies  Allergen Reactions  . Etodolac Nausea Only  . Meloxicam Other (See Comments)    Abdominal pain  . Flexeril [Cyclobenzaprine] Nausea Only  . Nsaids Other (See Comments)    Stomach pain  . Prozac [Fluoxetine Hcl] Other (See Comments)    Felt bad/awful    Medications Prior to Admission  Medication Sig Dispense Refill Last Dose  . calcium carbonate (TUMS - DOSED IN MG ELEMENTAL CALCIUM) 500 MG chewable tablet Chew 2 tablets by mouth 4 (four) times daily as needed for indigestion or heartburn.   Past Week at Unknown time  . hydrOXYzine (ATARAX/VISTARIL) 25 MG tablet Take 1-2 tablets (25-50 mg total) by  mouth every 6 (six) hours as needed for anxiety. 30 tablet 2 Past Week at Unknown time  . NIFEdipine (ADALAT CC) 60 MG 24 hr tablet Take 1 tablet (60 mg total) by mouth daily. 30 tablet 0 05/26/2018 at Unknown time  . Prenatal Vit-Fe Fumarate-FA (PREPLUS) 27-1 MG TABS Take 1 tablet by mouth daily. 30 tablet 6 Past Week at Unknown time  . Elastic Bandages & Supports (COMFORT FIT MATERNITY SUPP LG) MISC 1 Units by Does not apply route daily as needed. 1 each 0 Taking     Review of Systems  All systems reviewed and negative except as stated in HPI  Physical Exam Blood pressure (!) 159/96, pulse (!) 106, temperature 98.8 F (37.1 C), temperature source Oral, resp. rate 18, height 5\' 8"  (1.727 m), weight (!) 146.6 kg, last menstrual period 09/10/2017. General appearance: alert, oriented, NAD Lungs: normal respiratory effort Heart: regular rate Abdomen: soft, non-tender; gravid, FH appropriate for GA Extremities: No calf swelling or tenderness Presentation: cephalic Fetal monitoring: 150s with moderate variability, accels present, no decels. Uterine activity: No contractions at this time Dilation: 1.5 Effacement (%): 60 Station: -3 Exam by:: A Showfety RN  Prenatal labs: ABO, Rh: --/--/B POS (12/03 0732) Antibody: NEG (12/03 0732) Rubella: 1.09 (05/13 1135) RPR: Non Reactive (05/13 1135)  HBsAg: Negative (05/13 1135)  HIV: Non Reactive (05/13 1135)  GC/Chlamydia: Neg GBS: Negative (08/25 0000)  2-hr GTT: x2 per patient in first and early 3rd trimester Genetic screening:   Anatomy US: No abnormalities  Prenatal Transfer Tool  Maternal Diabetes: No Genetic Screening: Normal Maternal Ultrasounds/Referrals: Normal Fetal Ultrasounds or other Referrals:  None Maternal Substance Abuse:  No Significant Maternal Medications:  Meds include: Other: Nifedipine 60 mg daily, macrobid 100 mg QHS Significant Maternal Lab Results: Lab values include: Group B Strep negative, Other: elevated  AST/ALT  Results for orders placed or performed during the hospital encounter of 05/27/18 (from the past 24 hour(s))  CBC   Collection Time: 05/27/18  7:32 AM  Result Value Ref Range   WBC 8.4 4.0 - 10.5 K/uL   RBC 5.03 3.87 - 5.11 MIL/uL   Hemoglobin 11.5 (L) 12.0 - 15.0 g/dL   HCT 37.4 36.0 - 46.0 %   MCV 74.4 (L) 80.0 - 100.0 fL   MCH 22.9 (L) 26.0 - 34.0 pg   MCHC 30.7 30.0 - 36.0 g/dL   RDW 18.6 (H) 11.5 - 15.5 %   Platelets 212 150 - 400 K/uL   nRBC  0.0 0.0 - 0.2 %  Type and screen   Collection Time: 05/27/18  7:32 AM  Result Value Ref Range   ABO/RH(D) B POS    Antibody Screen NEG    Sample Expiration      05/30/2018 Performed at [facility], [address], [place], [place] [address]   Results for orders placed or performed during the hospital encounter of 05/26/18 (from the past 24 hour(s))  CBC   Collection Time: 05/26/18  4:04 PM  Result Value Ref Range   WBC 9.7 4.0 - 10.5 K/uL   RBC 5.01 3.87 - 5.11 MIL/uL   Hemoglobin 11.4 (L) 12.0 - 15.0 g/dL   HCT 37.3 36.0 - 46.0 %   MCV 74.5 (L) 80.0 - 100.0 fL   MCH 22.8 (L) 26.0 - 34.0 pg   MCHC 30.6 30.0 - 36.0 g/dL   RDW 18.6 (H) 11.5 - 15.5 %   Platelets 201 150 - 400 K/uL   nRBC 0.0 0.0 - 0.2 %  Comprehensive metabolic panel   Collection Time: 05/26/18  4:04 PM  Result Value Ref Range   Sodium 133 (L) 135 - 145 mmol/L   Potassium 4.0 3.5 - 5.1 mmol/L   Chloride 106 98 - 111 mmol/L   CO2 18 (L) 22 - 32 mmol/L   Glucose, Bld 115 (H) 70 - 99 mg/dL   BUN 7 6 - 20 mg/dL   Creatinine, Ser 0.37 (L) 0.44 - 1.00 mg/dL   Calcium 8.6 (L) 8.9 - 10.3 mg/dL   Total Protein 6.7 6.5 - 8.1 g/dL   Albumin 2.5 (L) 3.5 - 5.0 g/dL   AST 44 (H) 15 - 41 U/L   ALT 78 (H) 0 - 44 U/L   Alkaline Phosphatase 316 (H) 38 - 126 U/L   Total Bilirubin 0.2 (L) 0.3 - 1.2 mg/dL   GFR calc non Af Amer >60 >60 mL/min   GFR calc Af Amer >60 >60 mL/min   Anion gap 9 5 - 15  Protein / creatinine ratio, urine   Collection Time: 05/26/18   4:06 PM  Result Value Ref Range   Creatinine, Urine 110.00 mg/dL   Total Protein, Urine 24 mg/dL   Protein Creatinine Ratio 0.22 (H) 0.00 - 0.15 mg/mg[Cre]    Patient Active Problem List   Diagnosis Date Noted  . Hypertension in pregnancy 05/27/2018  . Pre-eclampsia 05/19/2018  . Chronic hypertension affecting pregnancy 05/18/2018  . Abnormal liver function tests 05/12/2018  . Chronic hypertension complicating pregnancy, antepartum 04/24/2018  . Supervision of high-risk pregnancy 11/04/2017  . Ovarian cyst 11/04/2017  . PCOS (polycystic ovarian syndrome) 10/03/2017  . Obesity, Class III, BMI 40-49.9 (morbid obesity) ([place]) 04/03/2017  . Seasonal allergies 09/18/2016  . Anxiety and depression 09/18/2016  . Frequent headaches 09/18/2016    Assessment: Brenn Gatton is a 22 y.o. G1P0000 at [redacted]w[redacted]d here for IOL at [redacted]w[redacted]d in the setting of cHTN and elevated transaminases. Patient had rule-out pre-eclampsia admission at [redacted]w[redacted]d reassuring with normal platelets and kidney function and improving AST/ALT. No indication for mag at this time, however low theshold to start mag for high range pressures if chronic blood pressure medication fails to control these. Several high pressures this morning in the setting of not having taken her morning medication.  #Labor: IOL progressing. Cytotec buccal q4h and foley placed. #Pain: No management required at this time. Desires epidural and IV pain medication PRN. #Concern for LGA: EFW in 90th percentile with AV >97 %ile at [redacted]w[redacted]d [place],  negative GTT 1st tri but missing records from 3rd tri GTT. Q4H CBGs until records located. #FWB: Reactive strip, continue to monitor. #ID: GBS negative, HIV negative. #MOF: Breast #MOC: Nexplanon #Circ:  Desires inpatient if insurance will cover, but willing to do outpatient. She is checking with insurance.  Oswaldo Milian, MD PGY-1 Family Medicine Resident 05/27/2018, 10:04 AM   Midwife attestation: I have seen and  examined this patient; I agree with above documentation in the resident's note.   PE: Gen: calm comfortable, NAD Resp: normal effort and rate Abd: gravid  ROS, labs, PMH reviewed  Assessment/Plan: Kieryn Burtis is a 22 y.o. G1P0000 here for IOL for CHTN, elevated liver enzymes Admit to LD Labor: latent FWB: Cat I ID: GBS neg Will start Cytotec and place FB for ripening Anticipate SVD  Julianne Handler, CNM  05/27/2018, 1:46 PM

## 2018-05-27 NOTE — Anesthesia Pain Management Evaluation Note (Signed)
  CRNA Pain Management Visit Note  Patient: Latoya Mora, 22 y.o., female  "Hello I am a member of the anesthesia team at [facility]. We have an anesthesia team available at all times to provide care throughout the hospital, including epidural management and anesthesia for C-section. I don't know your plan for the delivery whether it a natural birth, water birth, IV sedation, nitrous supplementation, doula or epidural, but we want to meet your pain goals."   1.Was your pain managed to your expectations on prior hospitalizations?   No prior hospitalizations  2.What is your expectation for pain management during this hospitalization?     Epidural and IV pain meds  3.How can we help you reach that goal? Support PRN  Record the patient's initial score and the patient's pain goal.   Pain: 0  Pain Goal: 8 The [facility] wants you to be able to say your pain was always managed very well.  Kathie Rhodes 05/27/2018

## 2018-05-28 ENCOUNTER — Other Ambulatory Visit (HOSPITAL_COMMUNITY): Payer: BLUE CROSS/BLUE SHIELD

## 2018-05-28 ENCOUNTER — Telehealth: Payer: Self-pay | Admitting: Radiology

## 2018-05-28 ENCOUNTER — Encounter (HOSPITAL_COMMUNITY): Payer: Self-pay

## 2018-05-28 DIAGNOSIS — O134 Gestational [pregnancy-induced] hypertension without significant proteinuria, complicating childbirth: Secondary | ICD-10-CM

## 2018-05-28 DIAGNOSIS — Z3A37 37 weeks gestation of pregnancy: Secondary | ICD-10-CM

## 2018-05-28 LAB — CBC
HCT: 34.8 % — ABNORMAL LOW (ref 36.0–46.0)
Hemoglobin: 10.6 g/dL — ABNORMAL LOW (ref 12.0–15.0)
MCH: 22.6 pg — ABNORMAL LOW (ref 26.0–34.0)
MCHC: 30.5 g/dL (ref 30.0–36.0)
MCV: 74 fL — ABNORMAL LOW (ref 80.0–100.0)
PLATELETS: 224 10*3/uL (ref 150–400)
RBC: 4.7 MIL/uL (ref 3.87–5.11)
RDW: 18.5 % — ABNORMAL HIGH (ref 11.5–15.5)
WBC: 15.3 10*3/uL — ABNORMAL HIGH (ref 4.0–10.5)
nRBC: 0 % (ref 0.0–0.2)

## 2018-05-28 LAB — RPR: RPR Ser Ql: NONREACTIVE

## 2018-05-28 MED ORDER — ZOLPIDEM TARTRATE 5 MG PO TABS: 5.0000 mg | ORAL_TABLET | Freq: Every evening | ORAL | Status: DC | PRN

## 2018-05-28 MED ORDER — SODIUM CHLORIDE 0.9% FLUSH: 3.0000 mL | INTRAVENOUS | Status: DC | PRN

## 2018-05-28 MED ORDER — SODIUM CHLORIDE 0.9 % IV SOLN: 250.0000 mL | INTRAVENOUS | Status: DC | PRN

## 2018-05-28 MED ORDER — SENNOSIDES-DOCUSATE SODIUM 8.6-50 MG PO TABS
2.0000 | ORAL_TABLET | ORAL | Status: DC
  Administered 2018-05-28: 2 via ORAL
  Filled 2018-05-28: qty 2

## 2018-05-28 MED ORDER — DIPHENHYDRAMINE HCL 25 MG PO CAPS: 25.0000 mg | ORAL_CAPSULE | Freq: Four times a day (QID) | ORAL | Status: DC | PRN

## 2018-05-28 MED ORDER — BISACODYL 10 MG RE SUPP: 10.0000 mg | Freq: Every day | RECTAL | Status: DC | PRN

## 2018-05-28 MED ORDER — COCONUT OIL OIL: 1.0000 "application " | TOPICAL_OIL | Status: DC | PRN

## 2018-05-28 MED ORDER — TETANUS-DIPHTH-ACELL PERTUSSIS 5-2.5-18.5 LF-MCG/0.5 IM SUSP: 0.5000 mL | Freq: Once | INTRAMUSCULAR | Status: DC

## 2018-05-28 MED ORDER — WITCH HAZEL-GLYCERIN EX PADS: 1.0000 "application " | MEDICATED_PAD | CUTANEOUS | Status: DC | PRN

## 2018-05-28 MED ORDER — ONDANSETRON HCL 4 MG PO TABS: 4.0000 mg | ORAL_TABLET | ORAL | Status: DC | PRN

## 2018-05-28 MED ORDER — OXYTOCIN 40 UNITS IN LACTATED RINGERS INFUSION - SIMPLE MED: 2.5000 [IU]/h | INTRAVENOUS | Status: DC

## 2018-05-28 MED ORDER — MEASLES, MUMPS & RUBELLA VAC IJ SOLR
0.5000 mL | Freq: Once | INTRAMUSCULAR | Status: DC
  Filled 2018-05-28: qty 0.5

## 2018-05-28 MED ORDER — SIMETHICONE 80 MG PO CHEW: 80.0000 mg | CHEWABLE_TABLET | ORAL | Status: DC | PRN

## 2018-05-28 MED ORDER — SODIUM CHLORIDE 0.9% FLUSH: 3.0000 mL | Freq: Two times a day (BID) | INTRAVENOUS | Status: DC

## 2018-05-28 MED ORDER — PRENATAL MULTIVITAMIN CH
1.0000 | ORAL_TABLET | Freq: Every day | ORAL | Status: DC
  Administered 2018-05-29: 1 via ORAL
  Filled 2018-05-28 (×2): qty 1

## 2018-05-28 MED ORDER — ONDANSETRON HCL 4 MG/2ML IJ SOLN: 4.0000 mg | INTRAMUSCULAR | Status: DC | PRN

## 2018-05-28 MED ORDER — FLEET ENEMA 7-19 GM/118ML RE ENEM: 1.0000 | ENEMA | Freq: Every day | RECTAL | Status: DC | PRN

## 2018-05-28 MED ORDER — AMLODIPINE BESYLATE 10 MG PO TABS
10.0000 mg | ORAL_TABLET | Freq: Every day | ORAL | Status: DC
  Administered 2018-05-28 – 2018-05-29 (×2): 10 mg via ORAL
  Filled 2018-05-28 (×3): qty 1

## 2018-05-28 MED ORDER — DIBUCAINE 1 % RE OINT: 1.0000 "application " | TOPICAL_OINTMENT | RECTAL | Status: DC | PRN

## 2018-05-28 MED ORDER — ACETAMINOPHEN 325 MG PO TABS
650.0000 mg | ORAL_TABLET | ORAL | Status: DC | PRN
  Administered 2018-05-28 – 2018-05-29 (×4): 650 mg via ORAL
  Filled 2018-05-28 (×4): qty 2

## 2018-05-28 MED ORDER — BENZOCAINE-MENTHOL 20-0.5 % EX AERO: 1.0000 "application " | INHALATION_SPRAY | CUTANEOUS | Status: DC | PRN

## 2018-05-28 NOTE — Lactation Note (Signed)
This note was copied from a baby's chart. Lactation Consultation Note Baby 3 hrs old. Baby grunting. Encouraged mom to hold baby STS. Mom concerned baby needs to feed. Baby fed OK in L&D per mom.  Mom has dx: PCOS. Mom has tubular breast w/widespace breast, everted nipples at bottom end of breast. Hand expression taught w/drops of colostrum noted. Rubbed on baby's gums. Baby made no effort to suckle on gloved finger.  Newborn behavior, STS, I&O, milk storage, breast massage.  Mom shown how to use DEBP & how to disassemble, clean, & reassemble parts. Mom knows to pump q3h for 15-20 min.  Encouraged mom when baby calms will try to BF.  [place] brochure given w/resources, support groups and [place] services.  Patient Name: Latoya Mora OIZTI'W Date: 05/28/2018 Reason for consult: Initial assessment;Early term 37-38.6wks;1st time breastfeeding   Maternal Data Has patient been taught Hand Expression?: Yes Does the patient have breastfeeding experience prior to this delivery?: No  Feeding Feeding Type: Breast Milk  LATCH Score Latch: Too sleepy or reluctant, no latch achieved, no sucking elicited.  Audible Swallowing: None  Type of Nipple: Everted at rest and after stimulation  Comfort (Breast/Nipple): Soft / non-tender  Hold (Positioning): Assistance needed to correctly position infant at breast and maintain latch.  LATCH Score: 7  Interventions Interventions: Breast feeding basics reviewed;Skin to skin;Expressed milk;Breast massage;Hand express;DEBP;Breast compression  Lactation Tools Discussed/Used Tools: Pump Breast pump type: Double-Electric Breast Pump WIC Program: Yes Pump Review: Setup, frequency, and cleaning;Milk Storage Initiated by:: Allayne Stack RN IBCLC Date initiated:: 05/28/18   Consult Status Consult Status: Follow-up Date: 05/28/18 Follow-up type: In-patient    Theodoro Kalata 05/28/2018, 4:40 AM

## 2018-05-28 NOTE — Telephone Encounter (Signed)
Left message for patient explaining that she been scheduled for BP check and postpartum. Explained that she can find the appointment dates and times in My Chart, requested that she call office to reschedule if the appointments will not work for her schedule.

## 2018-05-28 NOTE — Progress Notes (Signed)
Patient ID: Latoya Mora, female   DOB: [birth year], 22 y.o.   MRN: [id number] Latoya Mora is a 22 y.o. G1P0000 at [redacted]w[redacted]d admitted for induction of labor due to [facility].  Subjective: Feeling pressure  Objective: BP 139/88   Pulse (!) 110   Temp 98.6 F (37 C) (Oral)   Resp 18   Ht 5\' 8"  (1.727 m)   Wt (!) 146.6 kg   LMP 09/10/2017 (LMP Unknown)   BMI 49.14 kg/m  Total I/O In: -  Out: 1400 [Urine:1400]  FHT:  FHR: 145 bpm, variability: moderate,  accelerations:  Present,  decelerations:  Absent UC:   regular, every 2-3 minutes MVUs 270  SVE:   Dilation: 7 Effacement (%): 80 Station: -2, -1 Exam by:: Sports coach    Pitocin @ 6 mu/min  Labs: Lab Results  Component Value Date   WBC 8.4 05/27/2018   HGB 11.5 (L) 05/27/2018   HCT 37.4 05/27/2018   MCV 74.4 (L) 05/27/2018   PLT 212 05/27/2018    Assessment / Plan: Induction of labor due to [facility],  progressing well on pitocin  Labor: Progressing normally Fetal Wellbeing:  Category I Pain Control:  Epidural Pre-eclampsia: n/a I/D:  neg Anticipated MOD:  NSVD  [name] CNM, WHNP-BC 05/28/2018, 12:48 AM

## 2018-05-28 NOTE — Progress Notes (Signed)
   05/28/18 1136  Vital Signs  Pulse Rate (!) 109  BP (!) 144/104  md called on [id number]  in a delivery

## 2018-05-28 NOTE — Anesthesia Postprocedure Evaluation (Signed)
Anesthesia Post Note  Patient: Latoya Mora  Procedure(s) Performed: AN AD [place]     Patient location during evaluation: Mother Baby Anesthesia Type: Epidural Level of consciousness: awake, awake and alert and oriented Pain management: pain level controlled Respiratory status: spontaneous breathing Cardiovascular status: blood pressure returned to baseline Postop Assessment: no headache, no backache, epidural receding, patient able to bend at knees, no apparent nausea or vomiting, adequate PO intake and able to ambulate Anesthetic complications: no    Last Vitals:  Vitals:   05/28/18 0337 05/28/18 0440  BP: (!) 145/84 (!) 144/93  Pulse: (!) 112 (!) 113  Resp: 20 20  Temp: 37 C 36.8 C  SpO2:  97%    Last Pain:  Vitals:   05/28/18 0440  TempSrc: Oral  PainSc: 0-No pain   Pain Goal:                 Bufford Spikes

## 2018-05-28 NOTE — Lactation Note (Signed)
This note was copied from a baby's chart. Lactation Consultation Note Baby 5 hrs old. Mom called out states baby is cueing. Baby occasionally opening mouth cont. Grunting at intervals. No labored breathing noted. Baby has BM. LC showing FOB how to change a diaper. Noted baby very jittery. LC didn't observe jitteriness after admitted to rm. Parents stated they hadn't seen it either. Notified RN. RN witnessed jitteriness, ordered labs.  Attempted to latch in football position. Baby mainly grunting, slightly opens mouth. Taught mom "C" hold. Placed baby in football hold for baby to have optimal upright position for easier breathing.  Baby didn't BF, just attempted. Mom worried baby isn't feeding.  Baby has distended abd. Reviewed again newborn behavior and feeding habits. Mom used DEBP. Dusting to one flange of colostrum. Mom rubbed on baby's gums.  Mom and FOB has been doing STS.  Mom has PCOS, encouraged to pump 5-6 times a day. If baby isn't feeding then pump every 3 hrs for stimulation.  Answered mom's frequent questions.   Patient Name: Boy Khrystyne Arpin HEKBT'C Date: 05/28/2018 Reason for consult: Mother's request;Difficult latch;Early term 37-38.6wks   Maternal Data Has patient been taught Hand Expression?: Yes Does the patient have breastfeeding experience prior to this delivery?: No  Feeding Feeding Type: Breast Fed  LATCH Score Latch: Too sleepy or reluctant, no latch achieved, no sucking elicited.  Audible Swallowing: None  Type of Nipple: Everted at rest and after stimulation  Comfort (Breast/Nipple): Soft / non-tender  Hold (Positioning): Assistance needed to correctly position infant at breast and maintain latch.  LATCH Score: 5  Interventions Interventions: Breast feeding basics reviewed;Support pillows;Assisted with latch;Position options;Skin to skin;Breast massage;Breast compression;Adjust position;DEBP  Lactation Tools Discussed/Used Tools: Pump Breast pump  type: Double-Electric Breast Pump WIC Program: Yes Pump Review: Setup, frequency, and cleaning;Milk Storage Initiated by:: Allayne Stack RN IBCLC Date initiated:: 05/28/18   Consult Status Consult Status: Follow-up Date: 05/28/18 Follow-up type: In-patient    Theodoro Kalata 05/28/2018, 6:39 AM

## 2018-05-28 NOTE — Progress Notes (Signed)
05/28/18 1136  Vital Signs  Pulse Rate (!) 109  BP (!) 144/104  md called on [id number]  in a delivery  Md notified on 28915, stated they will check chart and follow up

## 2018-05-29 ENCOUNTER — Encounter (HOSPITAL_COMMUNITY): Payer: Self-pay

## 2018-05-29 MED ORDER — AMLODIPINE BESYLATE 10 MG PO TABS: 10.0000 mg | ORAL_TABLET | Freq: Every day | ORAL | 0 refills | Status: DC

## 2018-05-29 MED ORDER — BREAST PUMP MISC: 1.0000 | Freq: Once | 0 refills | Status: AC

## 2018-05-29 MED ORDER — BREAST PUMP MISC: 1.0000 | Freq: Every day | 0 refills | Status: DC | PRN

## 2018-05-29 NOTE — Progress Notes (Signed)
Post Partum Day 1 Subjective: no complaints, up ad lib, voiding, tolerating PO, + flatus and having difficulty with having latching. She is also having mild stress incontinence.   Objective: Blood pressure 130/81, pulse 96, temperature 98.6 F (37 C), temperature source Oral, resp. rate 16, height 5\' 8"  (1.727 m), weight (!) 146.6 kg, last menstrual period 09/10/2017, SpO2 99 %, unknown if currently breastfeeding.  Physical Exam:  General: alert, cooperative, appears stated age and no distress Lochia: appropriate Uterine Fundus: firm Incision: none DVT Evaluation: No evidence of DVT seen on physical exam.  Recent Labs    05/27/18 0732 05/28/18 0244  HGB 11.5* 10.6*  HCT 37.4 34.8*    Assessment/Plan: Plan for discharge tomorrow, Breastfeeding and Lactation consult   LOS: 2 days   Chelsey L Anderson 05/29/2018, 8:29 AM

## 2018-05-29 NOTE — Discharge Summary (Addendum)
Postpartum Discharge Summary     Patient Name: Latoya Mora DOB: [birth year] MRN: [id number]  Date of admission: 05/27/2018 Delivering Provider: Wells Guiles R   Date of discharge: 05/29/2018  Admitting diagnosis: 37wks induction  Intrauterine pregnancy: [redacted]w[redacted]d     Secondary diagnosis:  Active Problems:   Anxiety and depression   Obesity, Class III, BMI 40-49.9 (morbid obesity) ([place])   Supervision of high-risk pregnancy   Chronic hypertension complicating pregnancy, antepartum   Abnormal liver function tests   Hypertension in pregnancy  Additional problems: transaminitis     Discharge diagnosis: Term Pregnancy Delivered and CHTN                                                                                                Post partum procedures:none  Augmentation: AROM, Pitocin, Cytotec and Foley Balloon  Complications: None  Hospital course:  Induction of Labor With Vaginal Delivery   22 y.o. yo G1P1001 at [redacted]w[redacted]d was admitted to the hospital 05/27/2018 for induction of labor.  Indication for induction: CHTN and elevated liver enzymes. Pt had an antenatal admission for pre-eclampsia rule-out at 34+6 suspicious for labetalol liver toxicity with possible role in transaminases. With this admission she was induced using the usual methods and she progressed to SVD within approx 16hrs. Her LFTs were still slightly elevated on admission, but were lower than on 11/25; p/c ratio was neg. Patient had an uncomplicated labor course as follows: Membrane Rupture Time/Date: 10:09 PM ,05/27/2018   Intrapartum Procedures: Episiotomy: None [1]                                         Lacerations:  None [1]  Patient had delivery of a Viable infant.  Information for the patient's newborn:  Feather, Berrie [[id number]]  Delivery Method: Vaginal, Spontaneous(Filed from Delivery Summary)   05/28/2018  Details of delivery can be found in separate delivery note.  Patient had a routine postpartum  course. There were 2 elevated BPs- one within a couple of hours of delivery and the second >24h prior to d/c. She was started on Norvasc 10mg . Patient is discharged home 05/29/18 per her request for early d/c.  Magnesium Sulfate recieved: No BMZ received: No  Physical exam  Vitals:   05/28/18 1207 05/28/18 1644 05/28/18 2300 05/29/18 0637  BP: 118/75 129/86 126/84 130/81  Pulse: (!) 108 (!) 107 100 96  Resp:  18 18 16   Temp:  98.2 F (36.8 C) 98.4 F (36.9 C) 98.6 F (37 C)  TempSrc:  Oral Oral Oral  SpO2:  98% 100% 99%  Weight:      Height:       General: alert, cooperative and no distress Lochia: appropriate Uterine Fundus: firm Incision: N/A DVT Evaluation: No evidence of DVT seen on physical exam. Labs: Lab Results  Component Value Date   WBC 15.3 (H) 05/28/2018   HGB 10.6 (L) 05/28/2018   HCT 34.8 (L) 05/28/2018   MCV 74.0 (L) 05/28/2018   PLT 224  05/28/2018   CMP Latest Ref Rng & Units 05/26/2018  Glucose 70 - 99 mg/dL 115(H)  BUN 6 - 20 mg/dL 7  Creatinine 0.44 - 1.00 mg/dL 0.37(L)  Sodium 135 - 145 mmol/L 133(L)  Potassium 3.5 - 5.1 mmol/L 4.0  Chloride 98 - 111 mmol/L 106  CO2 22 - 32 mmol/L 18(L)  Calcium 8.9 - 10.3 mg/dL 8.6(L)  Total Protein 6.5 - 8.1 g/dL 6.7  Total Bilirubin 0.3 - 1.2 mg/dL 0.2(L)  Alkaline Phos 38 - 126 U/L 316(H)  AST 15 - 41 U/L 44(H)  ALT 0 - 44 U/L 78(H)    Discharge instruction: per After Visit Summary and "Baby and Me Booklet".  After visit meds:  Allergies as of 05/29/2018      Reactions   Etodolac Nausea Only   Meloxicam Other (See Comments)   Abdominal pain   Flexeril [cyclobenzaprine] Nausea Only   Nsaids Other (See Comments)   Stomach pain   Prozac [fluoxetine Hcl] Other (See Comments)   Felt bad/awful      Medication List    STOP taking these medications   calcium carbonate 500 MG chewable tablet Commonly known as:  TUMS - dosed in mg elemental calcium   COMFORT FIT MATERNITY SUPP LG Misc   NIFEdipine  60 MG 24 hr tablet Commonly known as:  ADALAT CC     TAKE these medications   amLODipine 10 MG tablet Commonly known as:  NORVASC Take 1 tablet (10 mg total) by mouth daily.   hydrOXYzine 25 MG tablet Commonly known as:  ATARAX/VISTARIL Take 1-2 tablets (25-50 mg total) by mouth every 6 (six) hours as needed for anxiety.   PREPLUS 27-1 MG Tabs Take 1 tablet by mouth daily.       Diet: routine diet  Activity: Advance as tolerated. Pelvic rest for 6 weeks.   Outpatient follow up:4 weeks plus one week check for BP Follow up Appt: Future Appointments  Date Time Provider [place]  06/03/2018 10:00 AM [facility] NURSE [facility]  06/24/2018 10:00 AM Donnamae Jude, MD [facility]   Follow up Visit:   Please schedule this patient for Postpartum visit in: 1 week with the following provider: RN For C/S patients schedule nurse incision check in weeks 2 weeks: no High risk pregnancy complicated by: HTN Delivery mode:  SVD Anticipated Birth Control:  other/unsure PP Procedures needed: BP check  Schedule Integrated BH visit: no   Newborn Data: Live born female  Birth Weight: 7 lb 6.8 oz (3368 g) APGAR: 7, 9  Newborn Delivery   Birth date/time:  05/28/2018 01:33:00 Delivery type:  Vaginal, Spontaneous     Baby Feeding: Breast Disposition:home with mother   05/29/2018 Richarda Osmond, DO  CNM attestation I have seen and examined this patient and agree with above documentation in the resident's note.   Latoya Mora is a 22 y.o. G1P1001 s/p SVD.   Pain is well controlled.  Plan for birth control is unsure.  Method of Feeding: breast  PE:  BP 130/81 (BP Location: Right Arm)   Pulse 96   Temp 98.6 F (37 C) (Oral)   Resp 16   Ht 5\' 8"  (1.727 m)   Wt (!) 146.6 kg   LMP 09/10/2017 (LMP Unknown)   SpO2 99%   Breastfeeding? Unknown   BMI 49.14 kg/m  Fundus firm  Recent Labs    05/27/18 0732 05/28/18 0244  HGB 11.5* 10.6*  HCT  37.4 34.8*  Plan: discharge today - postpartum care discussed - f/u clinic in 1wk for BP check then 4wks for postpartum visit  Myrtis Ser, CNM 12:57 PM  05/29/2018

## 2018-05-29 NOTE — Discharge Instructions (Signed)
Kegel Exercises Kegel exercises help strengthen the muscles that support the rectum, vagina, small intestine, bladder, and uterus. Doing Kegel exercises can help:  Improve bladder and bowel control.  Improve sexual response.  Reduce problems and discomfort during pregnancy.  Kegel exercises involve squeezing your pelvic floor muscles, which are the same muscles you squeeze when you try to stop the flow of urine. The exercises can be done while sitting, standing, or lying down, but it is best to vary your position. Phase 1 exercises 1. Squeeze your pelvic floor muscles tight. You should feel a tight lift in your rectal area. If you are a female, you should also feel a tightness in your vaginal area. Keep your stomach, buttocks, and legs relaxed. 2. Hold the muscles tight for up to 10 seconds. 3. Relax your muscles. Repeat this exercise 50 times a day or as many times as told by your health care provider. Continue to do this exercise for at least 4-6 weeks or for as long as told by your health care provider. This information is not intended to replace advice given to you by your health care provider. Make sure you discuss any questions you have with your health care provider. Document Released: 05/28/2012 Document Revised: 02/04/2016 Document Reviewed: 05/01/2015 Elsevier Interactive Patient Education  2018 [employer].  Postpartum Care After Vaginal Delivery The period of time right after you deliver your newborn is called the postpartum period. What kind of medical care will I receive?  You may continue to receive fluids and medicines through an IV tube inserted into one of your veins.  If an incision was made near your vagina (episiotomy) or if you had some vaginal tearing during delivery, cold compresses may be placed on your episiotomy or your tear. This helps to reduce pain and swelling.  You may be given a squirt bottle to use when you go to the bathroom. You may use this until you  are comfortable wiping as usual. To use the squirt bottle, follow these steps: ? Before you urinate, fill the squirt bottle with warm water. Do not use hot water. ? After you urinate, while you are sitting on the toilet, use the squirt bottle to rinse the area around your urethra and vaginal opening. This rinses away any urine and blood. ? You may do this instead of wiping. As you start healing, you may use the squirt bottle before wiping yourself. Make sure to wipe gently. ? Fill the squirt bottle with clean water every time you use the bathroom.  You will be given sanitary pads to wear. How can I expect to feel?  You may not feel the need to urinate for several hours after delivery.  You will have some soreness and pain in your abdomen and vagina.  If you are breastfeeding, you may have uterine contractions every time you breastfeed for up to several weeks postpartum. Uterine contractions help your uterus return to its normal size.  It is normal to have vaginal bleeding (lochia) after delivery. The amount and appearance of lochia is often similar to a menstrual period in the first week after delivery. It will gradually decrease over the next few weeks to a dry, yellow-brown discharge. For most women, lochia stops completely by 6-8 weeks after delivery. Vaginal bleeding can vary from woman to woman.  Within the first few days after delivery, you may have breast engorgement. This is when your breasts feel heavy, full, and uncomfortable. Your breasts may also throb and feel hard,  tightly stretched, warm, and tender. After this occurs, you may have milk leaking from your breasts.Your health care provider can help you relieve discomfort due to breast engorgement. Breast engorgement should go away within a few days.  You may feel more sad or worried than normal due to hormonal changes after delivery. These feelings should not last more than a few days. If these feelings do not go away after several  days, speak with your health care provider. How should I care for myself?  Tell your health care provider if you have pain or discomfort.  Drink enough water to keep your urine clear or pale yellow.  Wash your hands thoroughly with soap and water for at least 20 seconds after changing your sanitary pads, after using the toilet, and before holding or feeding your baby.  If you are not breastfeeding, avoid touching your breasts a lot. Doing this can make your breasts produce more milk.  If you become weak or lightheaded, or you feel like you might faint, ask for help before: ? Getting out of bed. ? Showering.  Change your sanitary pads frequently. Watch for any changes in your flow, such as a sudden increase in volume, a change in color, the passing of large blood clots. If you pass a blood clot from your vagina, save it to show to your health care provider. Do not flush blood clots down the toilet without having your health care provider look at them.  Make sure that all your vaccinations are up to date. This can help protect you and your baby from getting certain diseases. You may need to have immunizations done before you leave the hospital.  If desired, talk with your health care provider about methods of family planning or birth control (contraception). How can I start bonding with my baby? Spending as much time as possible with your baby is very important. During this time, you and your baby can get to know each other and develop a bond. Having your baby stay with you in your room (rooming in) can give you time to get to know your baby. Rooming in can also help you become comfortable caring for your baby. Breastfeeding can also help you bond with your baby. How can I plan for returning home with my baby?  Make sure that you have a car seat installed in your vehicle. ? Your car seat should be checked by a certified car seat installer to make sure that it is installed safely. ? Make sure  that your baby fits into the car seat safely.  Ask your health care provider any questions you have about caring for yourself or your baby. Make sure that you are able to contact your health care provider with any questions after leaving the hospital. This information is not intended to replace advice given to you by your health care provider. Make sure you discuss any questions you have with your health care provider. Document Released: 04/08/2007 Document Revised: 11/14/2015 Document Reviewed: 05/16/2015 Elsevier Interactive Patient Education  [employer].

## 2018-05-29 NOTE — Progress Notes (Signed)
MOB was referred for history of depression/anxiety. * Referral screened out by Clinical Social Worker because none of the following criteria appear to apply: ~ History of anxiety/depression during this pregnancy, or of post-partum depression following prior delivery. ~ Diagnosis of anxiety and/or depression within last 3 years OR * MOB's symptoms currently being treated with medication and/or therapy. MOB has a Rx for Vistaril.   Please contact the Clinical Social Worker if needs arise, by [facility] request, or if MOB scores greater than 9/yes to question 10 on [place] Postpartum Depression Screen.  Laurey Arrow, MSW, LCSW Clinical Social Work [phone]

## 2018-05-29 NOTE — Lactation Note (Signed)
This note was copied from a baby's chart. Lactation Consultation Note  Patient Name: Latoya Mora [id number] Date: 05/29/2018 Reason for consult: Follow-up assessment;Difficult latch;Early term 39-38.6wks Mom and baby will be discharged today.  Baby continues to have difficulty with latch.  Mom would like a plan for discharge.  Stressed importance of establishing a good milk supply and feeding baby per feeding guidelines.  Mom will purchase a DEBP for home use.  Instructed to pump 8-12 times in 24 hours.  Baby will be fed with cues but at least every 3 hours per volume guidelines.  Mom has Dr. Owens Shark bottles at home.  Mom concerned about tight labial frenulum.  Recommended and outpatient appointment early next week for feeding assessment.  Mom very agreeable and motivated.  Maternal Data    Feeding Feeding Type: Formula  LATCH Score Latch: Too sleepy or reluctant, no latch achieved, no sucking elicited.  Audible Swallowing: None  Type of Nipple: Everted at rest and after stimulation  Comfort (Breast/Nipple): Soft / non-tender  Hold (Positioning): Full assist, staff holds infant at breast  LATCH Score: 4  Interventions Interventions: Breast feeding basics reviewed;Support pillows;Assisted with latch;Position options;Skin to skin;Breast massage;Hand express;Breast compression;Adjust position  Lactation Tools Discussed/Used Tools: 8F feeding tube / Syringe   Consult Status Consult Status: Complete Date: 05/29/18 Follow-up type: Call as needed    Ave Filter 05/29/2018, 10:29 AM

## 2018-05-29 NOTE — Lactation Note (Signed)
This note was copied from a baby's chart. Lactation Consultation Note Baby 38 hrs old. Hasn't BF since 0230. Baby has been feeding poorly, difficulty latching. Baby will not open his mouth hardly, sucks on top lip, tongue thrust. W/gloved finger attempted suck training, baby clamps down, bites, sucks very tightly on finger.  Baby has little interest at the breast. 5 fr. Tubing put to breast. Baby wouldn't open mouth to get tubing in mouth, when you get the nipple in its just on the tip and then he pushes out. W/gloved finger baby took 7 ml Similac formula 5 fr. Tubing.and syring. Mom wants the baby to eat and welcomed any suggestions. Discussed w/mom it may take several different things to try before he takes to feeding. Mom is very patient. Paint praised mom. Mona heard baby screaming a lot at intervals. Unless he's STS he cries mom stated.  After feeding baby was sleeping soundly.  Mom pumped once in the night. Encouraged 5-6 times a days.  Encouraged mom to call for [facility] assistance when time to BF again w/supplement or latching assistance. explained to mom it takes times for babies to get the suck swallow coordination for feeding.  Baby has labial frenulum. Upper lip needs flanged frequently. Baby has mobility in tongue. May have slight posterior tongue tie, can see only slight heart shape at tip of tongue. LC is more concern w/getting baby to relax jaw. Cheek massage and jaw massage as well as finger massage tongue w/gloved finger. Discussed w/mom may need to teach baby w/nipple but will be last resort. Mom stated it didn't matter as long as baby eats.  Baby may need ST evaluation.   Patient Name: [name] LPNPY'Y Date: 05/29/2018 Reason for consult: Follow-up assessment;Difficult latch;Infant weight loss;1st time breastfeeding   Maternal Data    Feeding Feeding Type: Formula  LATCH Score Latch: Too sleepy or reluctant, no latch achieved, no sucking elicited.  Audible Swallowing:  None  Type of Nipple: Everted at rest and after stimulation  Comfort (Breast/Nipple): Soft / non-tender  Hold (Positioning): Full assist, staff holds infant at breast  LATCH Score: 4  Interventions Interventions: Breast feeding basics reviewed;Support pillows;Assisted with latch;Position options;Skin to skin;Breast massage;Hand express;Breast compression;Adjust position  Lactation Tools Discussed/Used Tools: 59F feeding tube / Syringe   Consult Status Consult Status: Follow-up Date: 05/29/18 Follow-up type: In-patient    Theodoro Kalata 05/29/2018, 6:32 AM

## 2018-05-30 ENCOUNTER — Ambulatory Visit: Payer: Self-pay

## 2018-05-30 NOTE — Lactation Note (Signed)
This note was copied from a baby's chart. Lactation Consultation Note  Patient Name: Latoya Mora EBXID'H Date: 05/30/2018 Reason for consult: Follow-up assessment Mom is pumping and obtained a few mls of colostrum from each breast this morning.  Baby is tolerating formula feedings well.  Mom will plan on making outpatient appointment for follow up.  Questions answered.  Maternal Data    Feeding Feeding Type: Bottle Fed - Formula Nipple Type: Slow - flow  LATCH Score                   Interventions    Lactation Tools Discussed/Used     Consult Status Consult Status: Follow-up Follow-up type: [place], [place] 05/30/2018, 9:21 AM

## 2018-05-31 ENCOUNTER — Ambulatory Visit: Payer: Self-pay

## 2018-05-31 NOTE — Lactation Note (Signed)
This note was copied from a baby's chart. Lactation Consultation Note  Patient Name: Latoya Mora ADGNP'H Date: 05/31/2018    Mom says she is pumping q3 hrs, except at night. She feels that her milk is transitioning in. Mom is comfortable w/pumping. Parents preparing to give infant a bath. Mom has my # to call for me to return.  Matthias Hughs [facility] 05/31/2018, 9:31 AM

## 2018-05-31 NOTE — Lactation Note (Signed)
This note was copied from a baby's chart. Lactation Consultation Note  Patient Name: Latoya Mora Today's Date: 05/31/2018   Mom says she has been leaking since 6 months pregnant. Mom feels that her breasts have gotten bigger since birth. Mom has been pumping and getting a little bit of EBM, but she has been discarding it. Mom was encouraged to feed it to infant & then offer formula.  Hand expression was taught to Mom. It is easy to express from Mom, but she finds it uncomfortable. Currently when Mom tries, it is not as effective, but she will practice.   The hand-out from the CDC, "How to Keep Your Breast Pump Kit Clean," was provided to parents and parents understand about separating all parts when cleaning & sterilizing once/day.   Mom has been pumping 5-6 times/day. I encouraged Mom to aim for 8 times/day.   Mom has my # to call if she has additional questions.   Mom noted to be on the following meds: -Amlodipine 10mg L3 -hydroxizine (L2)    Richey, Kimberely Hamilton 05/31/2018, 10:49 AM    

## 2018-06-04 ENCOUNTER — Other Ambulatory Visit: Payer: Self-pay | Admitting: Obstetrics & Gynecology

## 2018-06-04 ENCOUNTER — Other Ambulatory Visit: Payer: Self-pay | Admitting: Advanced Practice Midwife

## 2018-06-05 ENCOUNTER — Ambulatory Visit: Payer: Self-pay

## 2018-06-05 NOTE — Lactation Note (Signed)
This note was copied from a baby's chart. 06/05/2018  Name: Latoya Mora MRN: [id number] Date of Birth: 05/28/2020 Gestational Age: Gestational Age: [redacted]w[redacted]d Birth Weight: 118.8 oz Weight today:    7 pounds 8.1 ounces (3406 grams) with clean newborn diaper  78 day old ET infant presents today with mom and dad for feeding assessment. mom reports infant is not latching well and her milk supply is not enough.   Infant has gained 171 grams in the last 5 days with an average daily weight gain of 34 grams a day.  Infant self awakens to feed and is gaining weight well. Reviewed supply and demand and importance of breast stimulation and breast emptying. Enc mom to pump as often as infant gets a bottle. Mom tried the SNS in the hospital, she does not wish to continue.   Attempted to latch infant to the right breast in the football hold. Infant not able to sustain latch despite good positioning, support and deep latch. Infant tongue thrusting and pulling on the off the breast. Mom reports he only BF for about 20 sec at a time. # 24 NS applied and then infant relatched. Mom was pleased infant able to sustain latch. Infant fed needing stimulation. He transferred 4 ml. Feeding finished with bottle feeding.   Parents reports infant feeds best with Dr. Saul Fordyce Bottle with less drooling and choking. Enc them to feed using paced bottle feeding method.   Infant to follow up with Dr. Raul Del on 12/16. Mom aware of BF Support Groups. Infant to follow up with Lactation in 1 week.      General Information: Mother's reason for visit: Feeding assessment, ET infant, not latching well Consult: Initial Lactation consultant: Nonah Mattes RN,IBCLC Breastfeeding experience: not latching well, supplementing well Maternal medical conditions: Polycystic ovarian syndrome, Pregnancy induced hypertension Maternal medications: Pre-natal vitamin, Other(Amlodipine)  Breastfeeding History: Frequency of breast feeding: tries  with each feeding Duration of feeding: 20 sec, few sucks  Supplementation: Supplement method: bottle(Dr. Brown's ) Brand: Fawn Kirk Formula volume: 2-4 ounces Formula frequency: 6-7 x a day   Breast milk volume: 2 ounces Breast milk frequency: 2 x a day   Pump type: Other([facility] Baby) Pump frequency: tries for 7-8 x a day Pump volume: 2 ounces  Infant Output Assessment: Voids per 24 hours: 8 Urine color: Clear yellow Stools per 24 hours: 3-4 Stool color: Yellow  Breast Assessment: Breast: Soft, Compressible Nipple: Erect Pain level: 0 Pain interventions: Bra, Breast pump  Feeding Assessment: Infant oral assessment: Variance Infant oral assessment comment: Infant with thin labial frenulum that inserts at the bottom of the gum ridge, upper lip flanges well. infant seems to tongue thrust on the breast and unable to sustain latch despite good positioing and support. Nipples are compressible and everted. Tongue with good extension and lateralization Positioning: Football(right breast, 5 minutes) Latch: 1 - Repeated attempts needed to sustain latch, nipple held in mouth throughout feeding, stimulation needed to elicit sucking reflex. Audible swallowing: 1 - A few with stimulation Type of nipple: 2 - Everted at rest and after stimulation Comfort: 2 - Soft/non-tender Hold: 1 - Assistance needed to correctly position infant at breast and maintain latch LATCH score: 7 Latch assessment: Deep Lips flanged: Yes Suck assessment: Displays both Tools: Nipple shield 24 mm Pre-feed weight: 3406 grams Post feed weight: 3410 grams Amount transferred: 4 ml Amount supplemented: 2 ounces formula via bottle  Additional Feeding Assessment:  Totals: Total amount transferred: 4 ml Total supplement given: 60 ml Total amount pumped post feed: did not pump   Plan:  1. Offer infant the breast with feeding cues, Limit breast feeding to  20 minutes if infant sleepy at the breast. Try to put to breast at least 4 x a day to practice Breast feeding 2. Use the # 24 Nipple Shield with feeding as needed. Try each day without it to see when he can feed without it. 3. Keep infant awake at the breast as needed 4. Massage/intermittently compress the breast with feedings when infant slows suckling rate 5. Offer infant a bottle of pumped breast milk or formula after breast feeding 6. Continue to use the Dr. Saul Fordyce Bottle with feeding 7. Use the paced bottle feeding method with feeding infant (video on [email or web address].[email or web address]) 8. Infant needs about 64-85 ml (2-3 ounces) for 8 feedings a day or 510-680 ml (17-23 ounces) in 24 hours. Infant may eat more or less depending on how often he feeds. Feed him until he is satisfied 9. Would recommend that you pump 7-8 x a day to promote and protect milk supply. Continue to pump both breasts at the same. Massage breasts while pumping and finish with hand expression.  10. Keep up the good work 69. Thank you for allowing me to assist you today 12. Please call with any questions/concerns as needed [phone] 13. Follow up with Lactation in 1 week.    [place] RN, IBCLC                                                      Debby Freiberg Hice 06/05/2018, 10:57 AM

## 2018-06-10 ENCOUNTER — Encounter: Payer: Self-pay | Admitting: Radiology

## 2018-06-17 ENCOUNTER — Other Ambulatory Visit: Payer: Self-pay | Admitting: Advanced Practice Midwife

## 2018-06-21 ENCOUNTER — Other Ambulatory Visit: Payer: Self-pay | Admitting: Obstetrics & Gynecology

## 2018-06-24 ENCOUNTER — Ambulatory Visit (INDEPENDENT_AMBULATORY_CARE_PROVIDER_SITE_OTHER): Payer: BLUE CROSS/BLUE SHIELD | Admitting: Family Medicine

## 2018-06-24 VITALS — BP 134/83 | HR 92 | Ht 68.0 in | Wt 306.0 lb

## 2018-06-24 DIAGNOSIS — E282 Polycystic ovarian syndrome: Secondary | ICD-10-CM

## 2018-06-24 DIAGNOSIS — Z8759 Personal history of other complications of pregnancy, childbirth and the puerperium: Secondary | ICD-10-CM

## 2018-06-24 DIAGNOSIS — Z1389 Encounter for screening for other disorder: Secondary | ICD-10-CM

## 2018-06-24 DIAGNOSIS — F32A Depression, unspecified: Secondary | ICD-10-CM

## 2018-06-24 DIAGNOSIS — F419 Anxiety disorder, unspecified: Principal | ICD-10-CM

## 2018-06-24 DIAGNOSIS — F329 Major depressive disorder, single episode, unspecified: Secondary | ICD-10-CM

## 2018-06-24 MED ORDER — METFORMIN HCL 500 MG PO TABS: 500.0000 mg | ORAL_TABLET | Freq: Two times a day (BID) | ORAL | 2 refills | Status: DC

## 2018-06-24 MED ORDER — VENLAFAXINE HCL ER 75 MG PO CP24: 75.0000 mg | ORAL_CAPSULE | Freq: Every day | ORAL | 3 refills | Status: DC

## 2018-06-24 NOTE — Progress Notes (Signed)
Post Partum Exam  Latoya Mora is a 22 y.o. G84P1001 female who presents for a postpartum visit. She is 4 weeks postpartum following a spontaneous vaginal delivery. I have fully reviewed the prenatal and intrapartum course. The delivery was at 37.1 gestational weeks.  Anesthesia: epidural. Postpartum course has been uncomplicated. Baby's course has been uncomplicated. Baby is feeding by both breast and bottle - gerber gentla pro. Bleeding no bleeding. Bowel function is normal. Bladder function is normal. Patient is sexually active. Contraception method is undecided. Postpartum depression screening is positive. She is tearful, sad, overwhelmed and sleep deprived. Has h/o depression and anxiety. Feels like she is not making enough milk, which has her stressed out.  I have independently verified the above.  The following portions of the patient's history were reviewed and updated as appropriate: allergies, current medications, past family history, past medical history, past social history, past surgical history and problem list. Last pap smear done 2019 and was Normal  Review of Systems Pertinent items noted in HPI and remainder of comprehensive ROS otherwise negative.    Objective:  Blood pressure 134/83, pulse 92, height 5\' 8"  (1.727 m), weight (!) 306 lb (138.8 kg), last menstrual period 09/10/2017, unknown if currently breastfeeding.  General:  alert, cooperative and appears stated age  Lungs: normal effort  Heart:  regular rate and rhythm  Abdomen: soft, non-tender; bowel sounds normal; no masses,  no organomegaly        Assessment:    Normal postpartum exam. Pap smear not done at today's visit.   Plan:   1. Contraception: coitus interruptus and condoms 2. Trial of Effexor 3. Follow up in: 4 weeks for f/u depression or as needed.  4. CPE in 3-09/2018 5. Fenugreek or donor milk prn

## 2018-06-26 ENCOUNTER — Encounter: Payer: Self-pay | Admitting: Family Medicine

## 2018-06-26 DIAGNOSIS — Z8759 Personal history of other complications of pregnancy, childbirth and the puerperium: Secondary | ICD-10-CM | POA: Insufficient documentation

## 2018-06-26 HISTORY — DX: Personal history of other complications of pregnancy, childbirth and the puerperium: Z87.59

## 2018-06-26 NOTE — Assessment & Plan Note (Signed)
Resume her Metformin

## 2018-06-26 NOTE — Assessment & Plan Note (Signed)
Weaned off her meds prior to pregnancy--will resume these at this time.

## 2018-07-28 ENCOUNTER — Ambulatory Visit: Payer: BLUE CROSS/BLUE SHIELD | Admitting: Family Medicine

## 2018-07-28 ENCOUNTER — Encounter: Payer: Self-pay | Admitting: Family Medicine

## 2018-07-28 NOTE — Progress Notes (Signed)
Patient did not keep appointment today. She will be called to reschedule.  

## 2018-07-29 ENCOUNTER — Telehealth: Payer: Self-pay | Admitting: *Deleted

## 2018-07-29 NOTE — Telephone Encounter (Signed)
Left message for patient to call us back, called to check on her since she missed her appointment yesterday.

## 2018-07-29 NOTE — Telephone Encounter (Signed)
Called patient again to follow up on no showing her appointment on 2/3. Pt states she sorry she got busy and forgot, but that she is feeling better and feels the meds are helping. Will call office tomorrow to make another appointment.  Crosby Oyster, RN

## 2018-10-01 ENCOUNTER — Telehealth: Payer: Self-pay | Admitting: *Deleted

## 2018-10-01 NOTE — Telephone Encounter (Signed)
Pt called into office stating she is has PCOS and can fell a cyst on her right ovary that is causing pressure in her lower abdomen and "pinching" when she moves. Pt states she is taking only Tylenol for this and wanted to know when she should see a physician. Explained that we are trying to limit visit at this time and instructed patient that she can alternate Tylenol and Ibuprofen to see if that help with the discomfort and the cyst should resolve.

## 2018-11-13 ENCOUNTER — Encounter: Payer: Self-pay | Admitting: Obstetrics & Gynecology

## 2018-11-13 ENCOUNTER — Ambulatory Visit (INDEPENDENT_AMBULATORY_CARE_PROVIDER_SITE_OTHER): Payer: BLUE CROSS/BLUE SHIELD | Admitting: Obstetrics & Gynecology

## 2018-11-13 ENCOUNTER — Other Ambulatory Visit: Payer: Self-pay

## 2018-11-13 VITALS — BP 134/78 | HR 72 | Wt 321.2 lb

## 2018-11-13 DIAGNOSIS — Z8742 Personal history of other diseases of the female genital tract: Secondary | ICD-10-CM | POA: Diagnosis not present

## 2018-11-13 DIAGNOSIS — E282 Polycystic ovarian syndrome: Secondary | ICD-10-CM | POA: Diagnosis not present

## 2018-11-13 DIAGNOSIS — N83292 Other ovarian cyst, left side: Secondary | ICD-10-CM

## 2018-11-13 DIAGNOSIS — N941 Unspecified dyspareunia: Secondary | ICD-10-CM | POA: Diagnosis not present

## 2018-11-13 NOTE — Progress Notes (Signed)
   GYNECOLOGY OFFICE VISIT NOTE  History:   Lavonna Lampron is a 23 y.o. G1P1001 here today for evaluation of possible bilateral ovarian cyst.  History of ovarian cysts and PCOS, not on any hormonal suppression due to personal preference and history of HTN. Reports bilateral pain, worsening during intercourse, not alleviated by Motrin or Tylenol. Reminds her of previous episodes of ovarian cysts but this has persisted for longer time. She denies any abnormal vaginal discharge, bleeding, urinary or bowel symptoms or other concerns.    Past Medical History:  Diagnosis Date  . Allergy   . Anxiety   . Depression   . Elevated liver enzymes   . Frequent headaches   . Hypertension   . PCOS (polycystic ovarian syndrome)   . Pre-eclampsia 05/19/2018    Past Surgical History:  Procedure Laterality Date  . TOOTH EXTRACTION      The following portions of the patient's history were reviewed and updated as appropriate: allergies, current medications, past family history, past medical history, past social history, past surgical history and problem list.   Health Maintenance:  Normal pap in 2017.  Review of Systems:  Pertinent items noted in HPI and remainder of comprehensive ROS otherwise negative.  Physical Exam:  BP 134/78   Pulse 72   Wt (!) 321 lb 3.2 oz (145.7 kg)   LMP 10/06/2018   BMI 48.84 kg/m  CONSTITUTIONAL: Well-developed, well-nourished female in no acute distress.  HEENT:  Normocephalic, atraumatic. External right and left ear normal. No scleral icterus.  NECK: Normal range of motion, supple, no masses noted on observation SKIN: No rash noted. Not diaphoretic. No erythema. No pallor. MUSCULOSKELETAL: Normal range of motion. No edema noted. NEUROLOGIC: Alert and oriented to person, place, and time. Normal muscle tone coordination. No cranial nerve deficit noted. PSYCHIATRIC: Normal mood and affect. Normal behavior. Normal judgment and thought content. CARDIOVASCULAR: Normal  heart rate noted RESPIRATORY: Effort and breath sounds normal, no problems with respiration noted ABDOMEN: No masses noted. Obese abdomen. No other overt distention noted.  +Bilateral lower quadrant tenderness on palpation, no rebound or guarding. PELVIC: Deferred      Assessment and Plan:    1. Dyspareunia in female 2. PCOS (polycystic ovarian syndrome) 3. History of ovarian cyst Likely recurrent physiologic cysts, will obtain ultrasound for further evaluation.  NSAIDs (Ibuprofen 600-800 mg tid prn pain) recommended for pain.  Will continue to monitor closely.  - US PELVIC COMPLETE WITH TRANSVAGINAL; Future  Routine preventative health maintenance measures emphasized. Please refer to After Visit Summary for other counseling recommendations.   Return for any gynecologic concerns.    Total face-to-face time with patient: 15 minutes.  Over 50% of encounter was spent on counseling and coordination of care.   Verita Schneiders, MD, [place] for [facility], [place]

## 2018-11-13 NOTE — Progress Notes (Signed)
Patient reports having some pain after intercourse and thinks she may have a cyst. This pain started about two months ago.

## 2018-11-13 NOTE — Patient Instructions (Signed)
Return to clinic for any scheduled appointments or for any gynecologic concerns as needed.   

## 2018-11-16 ENCOUNTER — Other Ambulatory Visit: Payer: Self-pay | Admitting: Family Medicine

## 2018-11-18 ENCOUNTER — Other Ambulatory Visit: Payer: Self-pay

## 2018-11-18 ENCOUNTER — Ambulatory Visit (HOSPITAL_COMMUNITY)
Admission: RE | Admit: 2018-11-18 | Discharge: 2018-11-18 | Disposition: A | Discharge status: Home / Self Care | Payer: BLUE CROSS/BLUE SHIELD | Source: Ambulatory Visit | Attending: Obstetrics & Gynecology | Admitting: Obstetrics & Gynecology

## 2018-11-18 DIAGNOSIS — E282 Polycystic ovarian syndrome: Secondary | ICD-10-CM | POA: Diagnosis present

## 2018-11-18 DIAGNOSIS — Z8742 Personal history of other diseases of the female genital tract: Secondary | ICD-10-CM | POA: Diagnosis present

## 2018-11-18 DIAGNOSIS — N941 Unspecified dyspareunia: Secondary | ICD-10-CM | POA: Diagnosis not present

## 2018-11-19 ENCOUNTER — Telehealth: Payer: Self-pay | Admitting: *Deleted

## 2018-11-19 ENCOUNTER — Other Ambulatory Visit: Payer: BLUE CROSS/BLUE SHIELD

## 2018-11-19 ENCOUNTER — Other Ambulatory Visit: Payer: Self-pay | Admitting: *Deleted

## 2018-11-19 DIAGNOSIS — N83292 Other ovarian cyst, left side: Secondary | ICD-10-CM

## 2018-11-19 NOTE — Progress Notes (Addendum)
Imaging Addendum  US Pelvic Complete With Transvaginal  Result Date: 11/18/2018 CLINICAL DATA:  Pelvic pain for 2 months. Dyspareunia. History of ovarian cysts. EXAM: TRANSABDOMINAL AND TRANSVAGINAL ULTRASOUND OF PELVIS TECHNIQUE: Both transabdominal and transvaginal ultrasound examinations of the pelvis were performed. Transabdominal technique was performed for global imaging of the pelvis including uterus, ovaries, adnexal regions, and pelvic cul-de-sac. It was necessary to proceed with endovaginal exam following the transabdominal exam to visualize the endometrium and ovaries. COMPARISON:  None FINDINGS: Uterus Measurements: 7.2 x 4.0 x 5.1 cm = volume: 77 mL. No fibroids or other mass visualized. Endometrium Thickness: 8 mm.  No focal abnormality visualized. Right ovary Measurements: 3.6 x 2.3 x 3.3 cm = volume: 14.0 mL. Normal appearance/no adnexal mass. Left ovary Measurements: No normal ovary visualized. A complex cystic lesion is seen in the left adnexa which shows several mural nodular soft tissue densities. On some images, these solid mural nodules appear hyperechoic, which could be due to internal fat. This lesion measures 6.7 by 4.7 x 6.6 cm. Other findings No abnormal free fluid. IMPRESSION: 6.7 cm complex cystic mass in left adnexa, with several mural soft tissue nodules which appear somewhat hyperechoic. This could represent a benign ovarian dermoid, although other cystic ovarian neoplasms cannot be excluded. Recommend pelvis MRI without and with contrast for further characterization. Electronically Signed   By: Earle Gell M.D.   On: 11/18/2018 15:57    Patient has an abnormal large left ovarian cyst that replaces the left ovary. Needs gynecologic surgical consultation ASAP (has to be with one of my partners as I am on PAL and will not be office for the next few weeks). I tried to call patient to discuss results but got voicemail. I sent message to office nurses to call to inform patient of  results and recommendations. Will release results to patient on MyChart after she knows the results, the recommendation on the study is for pelvic MRI for further characterization but will leave that to the gynecologic surgeon's recommendations.  In the meantime, patient will come to office for lab draw for tumor markers given complexity of cyst, orders placed. Will follow up results and manage accordingly.   Latoya Schneiders, MD, [place] for [facility], [place]

## 2018-11-19 NOTE — Telephone Encounter (Signed)
-----   Message from Osborne Oman, MD sent at 11/19/2018 11:11 AM EDT ----- Patient has an abnormal left ovarian cyst that replaces the left ovary. Needs surgical consultation ASAP.  I tried to call patient to discuss results but got voicemail. Please call to inform patient of results and recommendations. Please release results to patient on MyChart after she knows the results, the recommendation on the study is for pelvic MRI for further characterization but will leave that to the surgeon's recommendations.   Verita Schneiders, MD, [place] for [facility], [place]

## 2018-11-19 NOTE — Telephone Encounter (Signed)
-----   Message from Allena Earing, NT sent at 11/19/2018  4:05 PM EDT ----- Regarding: requesting a call back Please call patient she needs to speak with you

## 2018-11-19 NOTE — Telephone Encounter (Signed)
Pt called wanting something for anxiety. Informed that I would check with MD. Pt also informed of changing appointments with MD's and once labs come back will determine if pt will need a MRI or not. Pt verbalizes and understands POC.

## 2018-11-19 NOTE — Telephone Encounter (Signed)
Pt informed of ultrasound results and POC.

## 2018-11-19 NOTE — Addendum Note (Signed)
Addended by: Verita Schneiders A on: 11/19/2018 11:55 AM   Modules accepted: Orders

## 2018-11-19 NOTE — Progress Notes (Signed)
Erroneous encounter

## 2018-11-20 ENCOUNTER — Telehealth: Payer: Self-pay | Admitting: *Deleted

## 2018-11-20 LAB — BETA HCG QUANT (REF LAB): hCG Quant: <1 m[IU]/mL

## 2018-11-20 LAB — AFP TUMOR MARKER: AFP, Serum, Tumor Marker: 1.1 ng/mL (ref 0.0–8.3)

## 2018-11-20 LAB — CA 125: Cancer Antigen (CA) 125: 11.3 U/mL (ref 0.0–38.1)

## 2018-11-20 LAB — LACTATE DEHYDROGENASE: LDH: 135 IU/L (ref 119–226)

## 2018-11-20 MED ORDER — BUSPIRONE HCL 10 MG PO TABS: 10.0000 mg | ORAL_TABLET | Freq: Three times a day (TID) | ORAL | 1 refills | Status: DC

## 2018-11-20 NOTE — Telephone Encounter (Signed)
Left message for pt that Dr Harolyn Rutherford sent in RX for her anxiety.

## 2018-11-21 ENCOUNTER — Other Ambulatory Visit: Payer: BLUE CROSS/BLUE SHIELD

## 2018-11-25 ENCOUNTER — Ambulatory Visit: Payer: BLUE CROSS/BLUE SHIELD | Admitting: Obstetrics and Gynecology

## 2018-11-26 ENCOUNTER — Telehealth: Payer: BLUE CROSS/BLUE SHIELD | Admitting: Nurse Practitioner

## 2018-11-26 DIAGNOSIS — B372 Candidiasis of skin and nail: Secondary | ICD-10-CM

## 2018-11-26 MED ORDER — NYSTATIN 100000 UNIT/GM EX CREA: 1.0000 "application " | TOPICAL_CREAM | Freq: Two times a day (BID) | CUTANEOUS | 0 refills | Status: DC

## 2018-11-26 NOTE — Progress Notes (Signed)
E Visit for Rash  We are sorry that you are not feeling well. Here is how we plan to help!   Based upon your presentation it appears you have a fungal infection.  I have prescribed: and Nystatin cream apply to the affected area twice daily   HOME CARE:   Take cool showers and avoid direct sunlight.  Let area air out if possible  Keep area as dry as possible   Gold bond powder can help  GET HELP RIGHT AWAY IF:   Symptoms don't go away after treatment.  Severe itching that persists.  If you rash spreads or swells.  If you rash begins to smell.  If it blisters and opens or develops a yellow-brown crust.  You develop a fever.  You have a sore throat.  You become short of breath.  MAKE SURE YOU:  Understand these instructions. Will watch your condition. Will get help right away if you are not doing well or get worse.  Thank you for choosing an e-visit. Your e-visit answers were reviewed by a board certified advanced clinical practitioner to complete your personal care plan. Depending upon the condition, your plan could have included both over the counter or prescription medications. Please review your pharmacy choice. Be sure that the pharmacy you have chosen is open so that you can pick up your prescription now.  If there is a problem you may message your provider in [place] to have the prescription routed to another pharmacy. Your safety is important to [place]. If you have drug allergies check your prescription carefully.  For the next 24 hours, you can use MyChart to ask questions about today's visit, request a non-urgent call back, or ask for a work or school excuse from your e-visit provider. You will get an email in the next two days asking about your experience. I hope that your e-visit has been valuable and will speed your recovery.   5-10 minutes spent reviewing and documenting in chart.

## 2018-12-11 ENCOUNTER — Other Ambulatory Visit: Payer: Self-pay

## 2018-12-11 ENCOUNTER — Encounter: Payer: Self-pay | Admitting: Obstetrics & Gynecology

## 2018-12-11 ENCOUNTER — Ambulatory Visit (INDEPENDENT_AMBULATORY_CARE_PROVIDER_SITE_OTHER): Payer: BC Managed Care – PPO | Admitting: Obstetrics & Gynecology

## 2018-12-11 VITALS — BP 136/84 | HR 91 | Ht 68.0 in | Wt 325.0 lb

## 2018-12-11 DIAGNOSIS — N83202 Unspecified ovarian cyst, left side: Secondary | ICD-10-CM

## 2018-12-11 NOTE — Progress Notes (Signed)
GYNECOLOGY OFFICE VISIT NOTE  History:   Latoya Mora is a 23 y.o. G1P1001 here today for discussion of management of symptomatic left large ovarian cyst.  She was having some increased pain a few weeks ago, and almost went to the ER, but pain subsided. Does have mild pain occasionally controlled on NSAIDs but still has dyspareunia.  Really concerned about this cyst. She denies any abnormal vaginal discharge, bleeding or other concerns.    Past Medical History:  Diagnosis Date   Allergy    Anxiety    Depression    Elevated liver enzymes    Frequent headaches    History of pre-eclampsia 06/26/2018   Hypertension    PCOS (polycystic ovarian syndrome)    Pre-eclampsia 05/19/2018    Past Surgical History:  Procedure Laterality Date   TOOTH EXTRACTION      The following portions of the patient's history were reviewed and updated as appropriate: allergies, current medications, past family history, past medical history, past social history, past surgical history and problem list.   Health Maintenance:  Normal pap in 2017.    Review of Systems:  Pertinent items noted in HPI and remainder of comprehensive ROS otherwise negative.  Physical Exam:  BP 136/84    Pulse 91    Ht 5\' 8"  (1.727 m)    Wt (!) 325 lb (147.4 kg)    LMP 11/12/2018 (Exact Date)    BMI 49.42 kg/m  CONSTITUTIONAL: Well-developed, well-nourished female in no acute distress.  HEENT:  Normocephalic, atraumatic. External right and left ear normal. No scleral icterus.  NECK: Normal range of motion, supple, no masses noted on observation SKIN: No rash noted. Not diaphoretic. No erythema. No pallor. MUSCULOSKELETAL: Normal range of motion. No edema noted. NEUROLOGIC: Alert and oriented to person, place, and time. Normal muscle tone coordination. No cranial nerve deficit noted. PSYCHIATRIC: Normal mood and affect. Normal behavior. Normal judgment and thought content. CARDIOVASCULAR: Normal heart rate  noted RESPIRATORY: Effort and breath sounds normal, no problems with respiration noted ABDOMEN: No masses noted. No other overt distention noted.   PELVIC: Deferred  Labs and Imaging Results for orders placed or performed in visit on 11/19/18 (from the past 672 hour(s))  CA 125   Collection Time: 11/19/18  3:25 PM  Result Value Ref Range   Cancer Antigen (CA) 125 11.3 0.0 - 38.1 U/mL  HCG, Tumor Marker   Collection Time: 11/19/18  3:25 PM  Result Value Ref Range   hCG Quant <1 mIU/mL  Lactate Dehydrogenase (LDH)   Collection Time: 11/19/18  3:25 PM  Result Value Ref Range   LDH 135 119 - 226 IU/L  AFP tumor marker   Collection Time: 11/19/18  3:25 PM  Result Value Ref Range   AFP, Serum, Tumor Marker 1.1 0.0 - 8.3 ng/mL    US Pelvic Complete With Transvaginal  Result Date: 11/18/2018 CLINICAL DATA:  Pelvic pain for 2 months. Dyspareunia. History of ovarian cysts. EXAM: TRANSABDOMINAL AND TRANSVAGINAL ULTRASOUND OF PELVIS TECHNIQUE: Both transabdominal and transvaginal ultrasound examinations of the pelvis were performed. Transabdominal technique was performed for global imaging of the pelvis including uterus, ovaries, adnexal regions, and pelvic cul-de-sac. It was necessary to proceed with endovaginal exam following the transabdominal exam to visualize the endometrium and ovaries. COMPARISON:  None FINDINGS: Uterus Measurements: 7.2 x 4.0 x 5.1 cm = volume: 77 mL. No fibroids or other mass visualized. Endometrium Thickness: 8 mm.  No focal abnormality visualized. Right ovary Measurements: 3.6 x 2.3  x 3.3 cm = volume: 14.0 mL. Normal appearance/no adnexal mass. Left ovary Measurements: No normal ovary visualized. A complex cystic lesion is seen in the left adnexa which shows several mural nodular soft tissue densities. On some images, these solid mural nodules appear hyperechoic, which could be due to internal fat. This lesion measures 6.7 by 4.7 x 6.6 cm. Other findings No abnormal free  fluid. IMPRESSION: 6.7 cm complex cystic mass in left adnexa, with several mural soft tissue nodules which appear somewhat hyperechoic. This could represent a benign ovarian dermoid, although other cystic ovarian neoplasms cannot be excluded. Recommend pelvis MRI without and with contrast for further characterization. Electronically Signed   By: Earle Gell M.D.   On: 11/18/2018 15:57      Assessment and Plan:    1. Cyst of left ovary Given cyst's size and patient's symptoms, surgical management was recommended.  I proposed doing laparoscopic unilateral ovarian cystectomy, possible salpingectomy, possible oophorectomy.  Given the ultrasound report and lack of finding any normal ovarian tissue on the left, oophorectomy was highly likely.  Patient was assured that as long as the other ovary and fallopian tube were normal, her fertility chances will not be adversely affected.   Of note, she had negative tumor markers which were reassuring but was told that the only sure way to eliminate possibility of neoplasia was via post-surgical pathologic analysis.  The risks of surgery were discussed in detail with the patient including but not limited to: bleeding which may require transfusion, further operations or reoperation; infection (or peritonitis with regards to dermoid cyst) which may require prolonged hospitalization or re-hospitalization and antibiotic therapy; injury to bowel, bladder, ureters and major vessels or other surrounding organs; need for additional procedures including laparotomy; thromboembolic phenomenon, incisional problems and other postoperative or anesthesia complications.  Patient was told that the likelihood that her condition and symptoms will be treated effectively with this surgical management was very high; the postoperative expectations were also discussed in detail. The patient also understands the alternative treatment options which were discussed in full. All questions were answered.   She was told that she will be contacted by our surgical scheduler regarding the time and date of her surgery; routine preoperative instructions of having nothing to eat or drink after midnight on the day prior to surgery and also coming to the hospital 1.5 hours prior to her time of surgery were also emphasized. Need for preprocedural COVID testing, and need to be quarantined from the time of test until the time of surgery was emphasized.    She was told she may be called for a preoperative appointment about a week prior to surgery and will be given further preoperative instructions at that visit. Printed patient education handouts about the procedure were given to the patient to review at home.  She will take NSAIDs as needed for pain for now, pain/torsion precautions reviewed with patient.   Return for any gynecologic concerns.    Total face-to-face time with patient: 25 minutes.  Over 50% of encounter was spent on counseling and coordination of care.   Verita Schneiders, MD, [place] [facility] [employer], [place]

## 2018-12-11 NOTE — Patient Instructions (Signed)
Unilateral Salpingo-Oophorectomy Unilateral salpingo-oophorectomy is the surgical removal of one fallopian tube and one ovary. The ovaries are the female reproductive organs that produce eggs. The fallopian tubes allow eggs to move from the ovaries to the uterus. You may need this procedure if you have:  An infection in the fallopian tube and ovary.  Scar tissue (adhesions) in the fallopian tube and ovary.  A cyst or tumor on the ovary.  Your uterus removed.  Cancer of the fallopian tube or ovary. There are three techniques that can be used for this procedure:  Open. One large incision will be made in your abdomen.  Laparoscopic. A thin, lighted tube with a camera (laparoscope) will be used to perform the procedure. The laparoscope will allow your surgeon to make several small incisions in the abdomen instead of one large incision.  Robot-assisted. A computer will be used to control surgical instruments that are attached to robotic arms. A laparoscope may also be used with this technique. This procedure:  Will not stop you from becoming pregnant.  Will not cause menopause.  Will not cause problems with your menstrual periods.  Will not affect your sex drive. Tell a health care provider about:  Any allergies you have.  All medicines you are taking, including vitamins, herbs, eye drops, creams, and over-the-counter medicines.  Any problems you or family members have had with anesthetic medicines.  Any blood disorders you have.  Any surgeries you have had.  Any medical conditions you have.  Whether you are pregnant or may be pregnant. What are the risks? Generally, this is a safe procedure. However, problems may occur, including:  Infection.  Bleeding.  Allergic reactions to medicines.  Damage to other structures or organs.  Blood clots in the legs or lungs. What happens before the procedure? Staying hydrated Follow instructions from your health care provider  about hydration, which may include:  Up to 2 hours before the procedure - you may continue to drink clear liquids, such as water, clear fruit juice, black coffee, and plain tea. Eating and drinking restrictions Follow instructions from your health care provider about eating and drinking, which may include:  8 hours before the procedure - stop eating heavy meals or foods such as meat, fried foods, or fatty foods.  6 hours before the procedure - stop eating light meals or foods, such as toast or cereal.  6 hours before the procedure - stop drinking milk or drinks that contain milk.  2 hours before the procedure - stop drinking clear liquids. Medicines  Ask your health care provider about: ? Changing or stopping your regular medicines. This is especially important if you are taking diabetes medicines or blood thinners. ? Taking over-the-counter medicines, vitamins, herbs, and supplements. ? Taking medicines such as aspirin and ibuprofen. These medicines can thin your blood. Do not take these medicines unless your health care provider tells you to take them.  You may be given antibiotic medicine to help prevent infection. General instructions  Do not smoke for at least 2 weeks before your procedure, or as told by your health care provider.  You may have an exam or testing.  You may have a blood or urine sample taken.  Ask your health care provider how your surgical site will be marked or identified.  Plan to have someone take you home from the hospital or clinic.  If you will be going home right after the procedure, plan to have someone with you for 24 hours. What happens  during the procedure?  To reduce your risk of infection: ? Your health care team will wash or sanitize their hands. ? Hair may be removed from the surgical area. ? Your skin will be washed with soap.  An IV will be inserted into one of your veins.  You will be given one or both of the following: ? A medicine  to help you relax (sedative). ? A medicine to make you fall asleep (general anesthetic).  A small, thin tube (catheter) will be inserted through your urethra and into your bladder. The catheter will drain urine during the procedure.  Depending on the type of surgery you are having, your surgeon will do one of the following: ? Make one incision in your abdomen (open surgery). ? Make two small incisions in your abdomen (laparoscopic surgery). The laparoscope will be passed through one incision, and surgical instruments will be passed through the other. ? Make several small incisions in your abdomen (robot-assisted surgery). A laparoscope and other surgical instruments may be passed through the incisions.  Your fallopian tube and ovary will be cut away from the uterus and removed.  Your blood vessels will be clamped and tied to prevent too much bleeding.  The incisions in your abdomen will be closed with stitches (sutures) or staples.  A bandage (dressing) may be placed over your incisions. The procedure may vary among health care providers and hospitals. What happens after the procedure?  Your blood pressure, heart rate, breathing rate, and blood oxygen level will be monitored until the medicines you were given have worn off.  You may continue to receive fluids and medicines through an IV.  You may continue to have a catheter draining your urine.  You may have to wear compression stockings. These stockings help to prevent blood clots and reduce swelling in your legs.  You will be given pain medicine as needed.  Do not drive for 24 hours if you received a sedative. Summary  Unilateral salpingo-oophorectomy is the surgical removal of one fallopian tube and one ovary.  There are three techniques that can be used for this procedure: open, laparoscopic, and robotic. Ask your health care provider which procedure will be used in your case.  This procedure will not stop you from becoming  pregnant, or cause problems with your menstrual periods or sex drive. This information is not intended to replace advice given to you by your health care provider. Make sure you discuss any questions you have with your health care provider. Document Released: 04/08/2009 Document Revised: 09/20/2016 Document Reviewed: 09/20/2016 Elsevier Interactive Patient Education  2019 [employer].

## 2018-12-11 NOTE — Addendum Note (Signed)
Addended by: Verita Schneiders A on: 12/11/2018 10:49 AM   Modules accepted: Orders, SmartSet

## 2018-12-13 ENCOUNTER — Other Ambulatory Visit: Payer: Self-pay | Admitting: Obstetrics & Gynecology

## 2018-12-16 ENCOUNTER — Ambulatory Visit: Payer: BLUE CROSS/BLUE SHIELD | Admitting: Obstetrics & Gynecology

## 2018-12-22 ENCOUNTER — Ambulatory Visit: Payer: BC Managed Care – PPO

## 2018-12-22 ENCOUNTER — Other Ambulatory Visit: Payer: Self-pay

## 2018-12-22 ENCOUNTER — Emergency Department (HOSPITAL_COMMUNITY)
Admission: EM | Admit: 2018-12-22 | Discharge: 2018-12-23 | Disposition: A | Discharge status: Home / Self Care | Payer: BC Managed Care – PPO | Attending: Emergency Medicine | Admitting: Emergency Medicine

## 2018-12-22 ENCOUNTER — Encounter (HOSPITAL_COMMUNITY): Payer: Self-pay | Admitting: *Deleted

## 2018-12-22 VITALS — BP 149/101 | HR 72

## 2018-12-22 DIAGNOSIS — R030 Elevated blood-pressure reading, without diagnosis of hypertension: Secondary | ICD-10-CM | POA: Diagnosis present

## 2018-12-22 DIAGNOSIS — Z3201 Encounter for pregnancy test, result positive: Secondary | ICD-10-CM

## 2018-12-22 DIAGNOSIS — Z79899 Other long term (current) drug therapy: Secondary | ICD-10-CM | POA: Insufficient documentation

## 2018-12-22 DIAGNOSIS — D509 Iron deficiency anemia, unspecified: Secondary | ICD-10-CM

## 2018-12-22 DIAGNOSIS — Z3491 Encounter for supervision of normal pregnancy, unspecified, first trimester: Secondary | ICD-10-CM | POA: Diagnosis not present

## 2018-12-22 LAB — BASIC METABOLIC PANEL
Anion gap: 10 (ref 5–15)
BUN: 13 mg/dL (ref 6–20)
CO2: 21 mmol/L — ABNORMAL LOW (ref 22–32)
Calcium: 8.4 mg/dL — ABNORMAL LOW (ref 8.9–10.3)
Chloride: 104 mmol/L (ref 98–111)
Creatinine, Ser: 0.58 mg/dL (ref 0.44–1.00)
GFR calc Af Amer: >60 mL/min (ref >60)
GFR calc non Af Amer: >60 mL/min (ref >60)
Glucose, Bld: 95 mg/dL (ref 70–99)
Potassium: 3.5 mmol/L (ref 3.5–5.1)
Sodium: 135 mmol/L (ref 135–145)

## 2018-12-22 LAB — CBC WITH DIFFERENTIAL/PLATELET
Abs Immature Granulocytes: 0.06 10*3/uL (ref 0.00–0.07)
Basophils Absolute: 0.1 10*3/uL (ref 0.0–0.1)
Basophils Relative: 1 %
Eosinophils Absolute: 0.1 10*3/uL (ref 0.0–0.5)
Eosinophils Relative: 1 %
HCT: 36.3 % (ref 36.0–46.0)
Hemoglobin: 10.6 g/dL — ABNORMAL LOW (ref 12.0–15.0)
Immature Granulocytes: 1 %
Lymphocytes Relative: 29 %
Lymphs Abs: 3.7 10*3/uL (ref 0.7–4.0)
MCH: 21.3 pg — ABNORMAL LOW (ref 26.0–34.0)
MCHC: 29.2 g/dL — ABNORMAL LOW (ref 30.0–36.0)
MCV: 72.9 fL — ABNORMAL LOW (ref 80.0–100.0)
Monocytes Absolute: 0.7 10*3/uL (ref 0.1–1.0)
Monocytes Relative: 5 %
Neutro Abs: 8 10*3/uL — ABNORMAL HIGH (ref 1.7–7.7)
Neutrophils Relative %: 63 %
Platelets: 312 10*3/uL (ref 150–400)
RBC: 4.98 MIL/uL (ref 3.87–5.11)
RDW: 19 % — ABNORMAL HIGH (ref 11.5–15.5)
WBC: 12.6 10*3/uL — ABNORMAL HIGH (ref 4.0–10.5)
nRBC: 0 % (ref 0.0–0.2)

## 2018-12-22 LAB — HCG, QUANTITATIVE, PREGNANCY: hCG, Beta Chain, Quant, S: 103 m[IU]/mL — ABNORMAL HIGH (ref <5)

## 2018-12-22 NOTE — ED Triage Notes (Signed)
Pt c/o abd pain, elevated blood pressure, pt reports that she was seen by obgyn today for ? Pregnancy, last period was 11/10/2018, abd pain has been ongoing for the past week, blood pressure was elevated in office today 150/90.

## 2018-12-22 NOTE — Progress Notes (Addendum)
Ms. Vanbuskirk presents today for UPT. She does not have any pregnancy complaints. She is nervous today and thinks her blood pressure ma be elevated due to her being nervous. Patient advised to recheck pressure when she gets home to make sure she it has come down. Received called from patient her blood pressure is 149/101. I have advised her to check again in another 1-2 hours to see if it settle down. Patient reports no signs of blurred vision or dizziness at this time. Advised to seek medical attention if her blood pressure does not come down this afternoon.  Patient voice understanding. LMP: 11/10/2018    OBJECTIVE: Appears well, in no apparent distress.  OB History    Gravida  1   Para  1   Term  1   Preterm  0   AB  0   Living  1     SAB  0   TAB  0   Ectopic  0   Multiple  0   Live Births  1          Home UPT Result: positive  In-Office UPT result: negative  I have reviewed the patient's medical, obstetrical, social, and family histories, and medications.   ASSESSMENT:Negative pregnancy test and will follow HCG per Dr.Anyanwu since patient is scheduled to have surgery. PLAN Will follow up with patient once the results come back.

## 2018-12-23 ENCOUNTER — Telehealth: Payer: Self-pay | Admitting: *Deleted

## 2018-12-23 LAB — BETA HCG QUANT (REF LAB): hCG Quant: 78 m[IU]/mL

## 2018-12-23 NOTE — ED Notes (Signed)
Pt given sprite per request 

## 2018-12-23 NOTE — Discharge Instructions (Addendum)
Please check your blood pressure once a day and keep a record of it. You will not need to be on medication unless it is persistently elevated.  Return to the Emergency Department if your abdominal pain gets worse, or if you have some vaginal bleeding.

## 2018-12-23 NOTE — Telephone Encounter (Signed)
Called pt to inform her of lab results and needing to come in for a repeat HCG. Pending results and confirmation of viable pregnancy we will then cancel her surgery. Pt verbalizes and understands. Will come in Thursday for a lab appointment

## 2018-12-23 NOTE — ED Provider Notes (Signed)
[facility] EMERGENCY DEPARTMENT Provider Note   CSN: [id number] Arrival date & time: 12/22/18  1839    History   Chief Complaint Chief Complaint  Patient presents with  . Hypertension    HPI Latoya Mora is a 23 y.o. female.   The history is provided by the patient.  Hypertension  She has history of pregnancy-induced hypertension and polycystic ovarian syndrome and comes in because of elevated blood pressure.  She was seen in her PCPs office today because of a positive home pregnancy test but had a negative pregnancy test at the office.  Blood pressure was noted to be mildly elevated to 150/90.  She was told to recheck her blood pressure at home, and it had actually gone a little higher so she came to the ED.  She periodically checks her blood pressure at home and usually has systolics in the 706C and diastolics usually below 80.  She denies headache, tinnitus, visual change, chest pain.  Last menses was May 18, but she has irregular menses.  She is gravida 1 para 1.  She has been noting some vague abdominal discomfort which is similar to what she had with her first pregnancy.  Past Medical History:  Diagnosis Date  . Allergy   . Anxiety   . Depression   . Elevated liver enzymes   . Frequent headaches   . History of pre-eclampsia 06/26/2018  . Hypertension   . PCOS (polycystic ovarian syndrome)   . Pre-eclampsia 05/19/2018    Patient Active Problem List   Diagnosis Date Noted  . Ovarian cyst 11/04/2017  . PCOS (polycystic ovarian syndrome) 10/03/2017  . Obesity, Class III, BMI 40-49.9 (morbid obesity) ([place]) 04/03/2017  . Seasonal allergies 09/18/2016  . Anxiety and depression 09/18/2016  . Frequent headaches 09/18/2016    Past Surgical History:  Procedure Laterality Date  . TOOTH EXTRACTION       OB History    Gravida  1   Para  1   Term  1   Preterm  0   AB  0   Living  1     SAB  0   TAB  0   Ectopic  0   Multiple  0   Live Births  1           Home Medications    Prior to Admission medications   Medication Sig Start Date End Date Taking? Authorizing Provider  busPIRone (BUSPAR) 10 MG tablet TAKE 1 TABLET BY MOUTH THREE TIMES A DAY Patient taking differently: Take 10 mg by mouth 3 (three) times daily.  12/13/18  Yes Anyanwu, Sallyanne Havers, MD  Prenatal Vit-Fe Fumarate-FA (PRENATAL MULTIVITAMIN) TABS tablet Take 1 tablet by mouth daily at 12 noon.   Yes [provider]  venlafaxine XR (EFFEXOR-XR) 75 MG 24 hr capsule Take 1 capsule (75 mg total) by mouth daily. 06/24/18  Yes Donnamae Jude, MD  nystatin cream (MYCOSTATIN) Apply 1 application topically 2 (two) times daily. Patient not taking: Reported on 12/22/2018 11/26/18   Chevis Pretty, FNP  busPIRone (BUSPAR) 10 MG tablet Take 1 tablet (10 mg total) by mouth 3 (three) times daily. 11/20/18   Osborne Oman, MD    Family History Family History  Problem Relation Age of Onset  . Alcohol abuse Mother   . Drug abuse Mother   . Depression Mother   . Anxiety disorder Mother   . Heart disease Mother   . Skin cancer Mother   . Scoliosis  Mother   . Alcohol abuse Father   . Bipolar disorder Father   . Depression Father   . Osteochondroma Father   . COPD Maternal Grandmother   . Scoliosis Maternal Grandmother   . COPD Maternal Grandfather   . Skin cancer Maternal Grandfather   . Arthritis Paternal Grandmother   . Bipolar disorder Paternal Grandfather   . Osteochondroma Sister   . Osteochondroma Brother   . Cancer Neg Hx     Social History Social History   Tobacco Use  . Smoking status: Never Smoker  . Smokeless tobacco: Never Used  Substance Use Topics  . Alcohol use: Not Currently  . Drug use: No     Allergies   Etodolac, Meloxicam, Flexeril [cyclobenzaprine], Nsaids, and Prozac [fluoxetine hcl]   Review of Systems Review of Systems  All other systems reviewed and are negative.    Physical Exam Updated Vital Signs BP 105/60   Pulse  95   Temp 99 F (37.2 C) (Oral)   Resp 18   LMP 11/10/2018   SpO2 99%   Physical Exam Vitals signs and nursing note reviewed.    23 year old female, resting comfortably and in no acute distress. Vital signs are normal. Oxygen saturation is 99%, which is normal. Head is normocephalic and atraumatic. PERRLA, EOMI. Oropharynx is clear. Neck is nontender and supple without adenopathy or JVD. Back is nontender and there is no CVA tenderness. Lungs are clear without rales, wheezes, or rhonchi. Chest is nontender. Heart has regular rate and rhythm without murmur. Abdomen is soft, flat, nontender without masses or hepatosplenomegaly and peristalsis is normoactive. Extremities have trace edema, full range of motion is present. Skin is warm and dry without rash. Neurologic: Mental status is normal, cranial nerves are intact, there are no motor or sensory deficits.  ED Treatments / Results  Labs (all labs ordered are listed, but only abnormal results are displayed) Labs Reviewed  HCG, QUANTITATIVE, PREGNANCY - Abnormal; Notable for the following components:      Result Value   hCG, Beta Chain, Quant, S 103 (*)    All other components within normal limits  CBC WITH DIFFERENTIAL/PLATELET - Abnormal; Notable for the following components:   WBC 12.6 (*)    Hemoglobin 10.6 (*)    MCV 72.9 (*)    MCH 21.3 (*)    MCHC 29.2 (*)    RDW 19.0 (*)    Neutro Abs 8.0 (*)    All other components within normal limits  BASIC METABOLIC PANEL - Abnormal; Notable for the following components:   CO2 21 (*)    Calcium 8.4 (*)    All other components within normal limits   Procedures Procedures  Medications Ordered in ED Medications - No data to display   Initial Impression / Assessment and Plan / ED Course  I have reviewed the triage vital signs and the nursing notes.  Pertinent lab results that were available during my care of the patient were reviewed by me and considered in my medical  decision making (see chart for details).  Transient elevation of blood pressure.  Initial blood pressure here was mildly elevated, but has come down to normal with simple observation.  Screening labs are obtained showing stable anemia.  Quantitative hCG level is 100 indicating early pregnancy.  Patient was advised of these findings, advised to my continue monitoring her blood pressure at home but told that there was no indication for initiating medication for blood pressure.  Follow-up with  her OB/GYN regarding positive pregnancy test.  Advised that if she is concerned, recheck hCG level in 3 days would be appropriate.  Advised to return if she develops worsening abdominal pain or vaginal bleeding.  Old records were reviewed confirming office visit earlier today with mildly elevated blood pressure.  Final Clinical Impressions(s) / ED Diagnoses   Final diagnoses:  Elevated blood-pressure reading, without diagnosis of hypertension  First trimester pregnancy  Microcytic anemia    ED Discharge Orders    None       Delora Fuel, MD 74/08/14 [phone]

## 2018-12-25 ENCOUNTER — Other Ambulatory Visit: Payer: BC Managed Care – PPO

## 2018-12-25 ENCOUNTER — Other Ambulatory Visit: Payer: Self-pay

## 2018-12-25 DIAGNOSIS — Z3201 Encounter for pregnancy test, result positive: Secondary | ICD-10-CM

## 2018-12-26 LAB — BETA HCG QUANT (REF LAB): hCG Quant: 322 m[IU]/mL

## 2018-12-27 NOTE — Progress Notes (Signed)
Rising appropriately, but still pregnancy of unknown location at this point.  Recheck HCG on 12/29/18. If still rising appropriately, will need ultrasound in about 2 weeks afterwards for viability of pregnancy.  Future surgery cancellation is very likely as HCG levels continue to increase, will address this after the values on 12/29/18.  Results were released to MyChart and patient was given recommendations as indicated.  Verita Schneiders, MD

## 2018-12-29 ENCOUNTER — Other Ambulatory Visit: Payer: BC Managed Care – PPO

## 2018-12-29 ENCOUNTER — Other Ambulatory Visit: Payer: Self-pay

## 2018-12-29 DIAGNOSIS — Z3201 Encounter for pregnancy test, result positive: Secondary | ICD-10-CM

## 2018-12-30 ENCOUNTER — Telehealth: Payer: Self-pay | Admitting: *Deleted

## 2018-12-30 LAB — BETA HCG QUANT (REF LAB): hCG Quant: 1933 m[IU]/mL

## 2018-12-30 NOTE — Telephone Encounter (Signed)
Pt informed of rising HCG and needing to do a viability scan in 7-10 days. Surgery will be cancelled. Pt verbalizes .

## 2019-01-07 ENCOUNTER — Other Ambulatory Visit: Payer: Self-pay | Admitting: Advanced Practice Midwife

## 2019-01-07 ENCOUNTER — Ambulatory Visit: Payer: BC Managed Care – PPO | Admitting: Advanced Practice Midwife

## 2019-01-07 ENCOUNTER — Ambulatory Visit (HOSPITAL_COMMUNITY): Admission: RE | Admit: 2019-01-07 | Payer: BC Managed Care – PPO | Source: Ambulatory Visit

## 2019-01-07 DIAGNOSIS — O3680X0 Pregnancy with inconclusive fetal viability, not applicable or unspecified: Secondary | ICD-10-CM

## 2019-01-07 NOTE — Progress Notes (Signed)
Rise in [place] hCG. See result note from Dr. Cheri Kearns, CNM 01/07/19 8:10 AM

## 2019-01-07 NOTE — Progress Notes (Signed)
Opened in error

## 2019-01-12 ENCOUNTER — Ambulatory Visit (INDEPENDENT_AMBULATORY_CARE_PROVIDER_SITE_OTHER): Payer: BC Managed Care – PPO | Admitting: Obstetrics & Gynecology

## 2019-01-12 ENCOUNTER — Encounter: Payer: Self-pay | Admitting: Obstetrics & Gynecology

## 2019-01-12 ENCOUNTER — Other Ambulatory Visit: Payer: Self-pay

## 2019-01-12 VITALS — BP 154/82 | HR 109 | Wt 332.0 lb

## 2019-01-12 DIAGNOSIS — O10919 Unspecified pre-existing hypertension complicating pregnancy, unspecified trimester: Secondary | ICD-10-CM | POA: Diagnosis not present

## 2019-01-12 DIAGNOSIS — O3680X0 Pregnancy with inconclusive fetal viability, not applicable or unspecified: Secondary | ICD-10-CM

## 2019-01-12 DIAGNOSIS — D271 Benign neoplasm of left ovary: Secondary | ICD-10-CM | POA: Diagnosis not present

## 2019-01-12 DIAGNOSIS — O10911 Unspecified pre-existing hypertension complicating pregnancy, first trimester: Secondary | ICD-10-CM | POA: Diagnosis not present

## 2019-01-12 DIAGNOSIS — O219 Vomiting of pregnancy, unspecified: Secondary | ICD-10-CM | POA: Diagnosis not present

## 2019-01-12 DIAGNOSIS — Z3A01 Less than 8 weeks gestation of pregnancy: Secondary | ICD-10-CM

## 2019-01-12 MED ORDER — LABETALOL HCL 200 MG PO TABS: 200.0000 mg | ORAL_TABLET | Freq: Two times a day (BID) | ORAL | 3 refills | Status: DC

## 2019-01-12 MED ORDER — BONJESTA 20-20 MG PO TBCR: 1.0000 | EXTENDED_RELEASE_TABLET | Freq: Every evening | ORAL | 1 refills | Status: DC | PRN

## 2019-01-12 NOTE — Progress Notes (Signed)
DATING AND VIABILITY SONOGRAM   [name] is a 23 y.o. year old G2P1001 with LMP Patient's last menstrual period was 11/14/2018. which would correlate to  [redacted]w[redacted]d weeks gestation.  She has irregular menstrual cycles.   She is here today for a confirmatory initial sonogram.    GESTATION: SINGLETON yes      FETAL ACTIVITY:          Heart rate       137          The fetus is active.   GESTATIONAL AGE AND  BIOMETRICS:  Gestational criteria: Estimated Date of Delivery: 08/28/19 by early ultrasound now at [redacted]w[redacted]d  Previous Scans:0  GESTATIONAL SAC                 CROWN RUMP LENGTH           1.26 mm         7 weeks                                                   AVERAGE EGA(BY THIS SCAN):  7 weeks 3 days  WORKING EDD( early ultrasound ):  08/28/2019     TECHNICIAN COMMENTS:  Patient informed that the ultrasound is considered a limited obstetric ultrasound and is not intended to be a complete ultrasound exam. Patient also informed that the ultrasound is not being completed with the intent of assessing for fetal or placental anomalies or any pelvic abnormalities. Explained that the purpose of today's ultrasound is to assess for fetal heart rate. Patient acknowledges the purpose of the exam and the limitations of the study.     A copy of this report including all images has been saved and backed up to a second source for retrieval if needed. All measures and details of the anatomical scan, placentation, fluid volume and pelvic anatomy are contained in that report.  Crosby Oyster 01/12/2019 3:36 PM

## 2019-01-12 NOTE — Patient Instructions (Signed)
First Trimester of Pregnancy The first trimester of pregnancy is from week 1 until the end of week 13 (months 1 through 3). A week after a sperm fertilizes an egg, the egg will implant on the wall of the uterus. This embryo will begin to develop into a baby. Genes from you and your partner will form the baby. The female genes will determine whether the baby will be a boy or a girl. At 6-8 weeks, the eyes and face will be formed, and the heartbeat can be seen on ultrasound. At the end of 12 weeks, all the baby's organs will be formed. Now that you are pregnant, you will want to do everything you can to have a healthy baby. Two of the most important things are to get good prenatal care and to follow your health care provider's instructions. Prenatal care is all the medical care you receive before the baby's birth. This care will help prevent, find, and treat any problems during the pregnancy and childbirth. Body changes during your first trimester Your body goes through many changes during pregnancy. The changes vary from woman to woman.  You may gain or lose a couple of pounds at first.  You may feel sick to your stomach (nauseous) and you may throw up (vomit). If the vomiting is uncontrollable, call your health care provider.  You may tire easily.  You may develop headaches that can be relieved by medicines. All medicines should be approved by your health care provider.  You may urinate more often. Painful urination may mean you have a bladder infection.  You may develop heartburn as a result of your pregnancy.  You may develop constipation because certain hormones are causing the muscles that push stool through your intestines to slow down.  You may develop hemorrhoids or swollen veins (varicose veins).  Your breasts may begin to grow larger and become tender. Your nipples may stick out more, and the tissue that surrounds them (areola) may become darker.  Your gums may bleed and may be  sensitive to brushing and flossing.  Dark spots or blotches (chloasma, mask of pregnancy) may develop on your face. This will likely fade after the baby is born.  Your menstrual periods will stop.  You may have a loss of appetite.  You may develop cravings for certain kinds of food.  You may have changes in your emotions from day to day, such as being excited to be pregnant or being concerned that something may go wrong with the pregnancy and baby.  You may have more vivid and strange dreams.  You may have changes in your hair. These can include thickening of your hair, rapid growth, and changes in texture. Some women also have hair loss during or after pregnancy, or hair that feels dry or thin. Your hair will most likely return to normal after your baby is born. What to expect at prenatal visits During a routine prenatal visit:  You will be weighed to make sure you and the baby are growing normally.  Your blood pressure will be taken.  Your abdomen will be measured to track your baby's growth.  The fetal heartbeat will be listened to between weeks 10 and 14 of your pregnancy.  Test results from any previous visits will be discussed. Your health care provider may ask you:  How you are feeling.  If you are feeling the baby move.  If you have had any abnormal symptoms, such as leaking fluid, bleeding, severe headaches, or abdominal   cramping.  If you are using any tobacco products, including cigarettes, chewing tobacco, and electronic cigarettes.  If you have any questions. Other tests that may be performed during your first trimester include:  Blood tests to find your blood type and to check for the presence of any previous infections. The tests will also be used to check for low iron levels (anemia) and protein on red blood cells (Rh antibodies). Depending on your risk factors, or if you previously had diabetes during pregnancy, you may have tests to check for high blood sugar  that affects pregnant women (gestational diabetes).  Urine tests to check for infections, diabetes, or protein in the urine.  An ultrasound to confirm the proper growth and development of the baby.  Fetal screens for spinal cord problems (spina bifida) and Down syndrome.  HIV (human immunodeficiency virus) testing. Routine prenatal testing includes screening for HIV, unless you choose not to have this test.  You may need other tests to make sure you and the baby are doing well. Follow these instructions at home: Medicines  Follow your health care provider's instructions regarding medicine use. Specific medicines may be either safe or unsafe to take during pregnancy.  Take a prenatal vitamin that contains at least 600 micrograms (mcg) of folic acid.  If you develop constipation, try taking a stool softener if your health care provider approves. Eating and drinking   Eat a balanced diet that includes fresh fruits and vegetables, whole grains, good sources of protein such as meat, eggs, or tofu, and low-fat dairy. Your health care provider will help you determine the amount of weight gain that is right for you.  Avoid raw meat and uncooked cheese. These carry germs that can cause birth defects in the baby.  Eating four or five small meals rather than three large meals a day may help relieve nausea and vomiting. If you start to feel nauseous, eating a few soda crackers can be helpful. Drinking liquids between meals, instead of during meals, also seems to help ease nausea and vomiting.  Limit foods that are high in fat and processed sugars, such as fried and sweet foods.  To prevent constipation: ? Eat foods that are high in fiber, such as fresh fruits and vegetables, whole grains, and beans. ? Drink enough fluid to keep your urine clear or pale yellow. Activity  Exercise only as directed by your health care provider. Most women can continue their usual exercise routine during  pregnancy. Try to exercise for 30 minutes at least 5 days a week. Exercising will help you: ? Control your weight. ? Stay in shape. ? Be prepared for labor and delivery.  Experiencing pain or cramping in the lower abdomen or lower back is a good sign that you should stop exercising. Check with your health care provider before continuing with normal exercises.  Try to avoid standing for long periods of time. Move your legs often if you must stand in one place for a long time.  Avoid heavy lifting.  Wear low-heeled shoes and practice good posture.  You may continue to have sex unless your health care provider tells you not to. Relieving pain and discomfort  Wear a good support bra to relieve breast tenderness.  Take warm sitz baths to soothe any pain or discomfort caused by hemorrhoids. Use hemorrhoid cream if your health care provider approves.  Rest with your legs elevated if you have leg cramps or low back pain.  If you develop varicose veins in   your legs, wear support hose. Elevate your feet for 15 minutes, 3-4 times a day. Limit salt in your diet. Prenatal care  Schedule your prenatal visits by the twelfth week of pregnancy. They are usually scheduled monthly at first, then more often in the last 2 months before delivery.  Write down your questions. Take them to your prenatal visits.  Keep all your prenatal visits as told by your health care provider. This is important. Safety  Wear your seat belt at all times when driving.  Make a list of emergency phone numbers, including numbers for family, friends, the hospital, and police and fire departments. General instructions  Ask your health care provider for a referral to a local prenatal education class. Begin classes no later than the beginning of month 6 of your pregnancy.  Ask for help if you have counseling or nutritional needs during pregnancy. Your health care provider can offer advice or refer you to specialists for help  with various needs.  Do not use hot tubs, steam rooms, or saunas.  Do not douche or use tampons or scented sanitary pads.  Do not cross your legs for long periods of time.  Avoid cat litter boxes and soil used by cats. These carry germs that can cause birth defects in the baby and possibly loss of the fetus by miscarriage or stillbirth.  Avoid all smoking, herbs, alcohol, and medicines not prescribed by your health care provider. Chemicals in these products affect the formation and growth of the baby.  Do not use any products that contain nicotine or tobacco, such as cigarettes and e-cigarettes. If you need help quitting, ask your health care provider. You may receive counseling support and other resources to help you quit.  Schedule a dentist appointment. At home, brush your teeth with a soft toothbrush and be gentle when you floss. Contact a health care provider if:  You have dizziness.  You have mild pelvic cramps, pelvic pressure, or nagging pain in the abdominal area.  You have persistent nausea, vomiting, or diarrhea.  You have a bad smelling vaginal discharge.  You have pain when you urinate.  You notice increased swelling in your face, hands, legs, or ankles.  You are exposed to fifth disease or chickenpox.  You are exposed to German measles (rubella) and have never had it. Get help right away if:  You have a fever.  You are leaking fluid from your vagina.  You have spotting or bleeding from your vagina.  You have severe abdominal cramping or pain.  You have rapid weight gain or loss.  You vomit blood or material that looks like coffee grounds.  You develop a severe headache.  You have shortness of breath.  You have any kind of trauma, such as from a fall or a car accident. Summary  The first trimester of pregnancy is from week 1 until the end of week 13 (months 1 through 3).  Your body goes through many changes during pregnancy. The changes vary from  woman to woman.  You will have routine prenatal visits. During those visits, your health care provider will examine you, discuss any test results you may have, and talk with you about how you are feeling. This information is not intended to replace advice given to you by your health care provider. Make sure you discuss any questions you have with your health care provider. Document Released: 06/05/2001 Document Revised: 05/24/2017 Document Reviewed: 05/23/2016 Elsevier Patient Education  2020 Else[employer] Inc.  

## 2019-01-12 NOTE — Progress Notes (Signed)
OFFICE VISIT NOTE  History:   Latoya Mora is a 23 y.o. G2P1001 here today for viability scan.  She denies any abnormal vaginal discharge, bleeding, pelvic pain or other concerns. Was originally scheduled for dermoid cyst surgery (has 6.7cm left ovarian dermoid cyst) this month, this was cancelled due to pregnancy.    Past Medical History:  Diagnosis Date  . Allergy   . Anxiety   . Depression   . Dermoid cyst of left ovary 11/04/2017  . Elevated liver enzymes   . Frequent headaches   . History of pre-eclampsia 06/26/2018  . Hypertension   . PCOS (polycystic ovarian syndrome)   . Pre-eclampsia 05/19/2018    Past Surgical History:  Procedure Laterality Date  . TOOTH EXTRACTION      The following portions of the patient's history were reviewed and updated as appropriate: allergies, current medications, past family history, past medical history, past social history, past surgical history and problem list.   Health Maintenance:  Normal pap in 2017.  Review of Systems:  Pertinent items noted in HPI and remainder of comprehensive ROS otherwise negative.  Physical Exam:  BP (!) 154/82   Pulse (!) 109   Wt (!) 332 lb (150.6 kg)   LMP 11/14/2018   BMI 50.48 kg/m  CONSTITUTIONAL: Well-developed, well-nourished female in no acute distress.  HEENT:  Normocephalic, atraumatic. External right and left ear normal. No scleral icterus.  NECK: Normal range of motion, supple, no masses noted on observation SKIN: No rash noted. Not diaphoretic. No erythema. No pallor. MUSCULOSKELETAL: Normal range of motion. No edema noted. NEUROLOGIC: Alert and oriented to person, place, and time. Normal muscle tone coordination. No cranial nerve deficit noted. PSYCHIATRIC: Normal mood and affect. Normal behavior. Normal judgment and thought content. CARDIOVASCULAR: Mildly elevated heart rate noted RESPIRATORY: Effort and breath sounds normal, no problems with respiration noted ABDOMEN: Obese. No masses  noted. No other overt distention noted.   PELVIC: Normal appearing external genitalia; normal appearing distal vaginal mucosa. Transvaginal scan done.  Labs and Imaging Patient informed that the ultrasound is considered a limited obstetric ultrasound and is not intended to be a complete ultrasound exam.  Patient also informed that the ultrasound is not being completed with the intent of assessing for fetal or placental anomalies or any pelvic abnormalities.  Explained that the purpose of today's ultrasound is to assess for fetal heart rate/viability.  Patient acknowledges the purpose of the exam and the limitations of the study.   Please see ultrasound report by Crosby Oyster, RN.  I performed scan with her and reviewed findings with patient. Ultrasound showed SIUP, [redacted]w[redacted]d, with FHR 137 bpm.     Assessment and Plan:      1. Encounter to determine fetal viability of pregnancy, single or unspecified fetus - US OB Transvaginal done today, viability confirmed. Continue prenatal vitamins, New OB visit soon. Re-enrolled in Babyscripts.   2. Preexisting hypertension complicating pregnancy, antepartum Elevated BP today, wil start meds. Start ASA later after 12 weeks. Will discuss more during NOB - labetalol (NORMODYNE) 200 MG tablet; Take 1 tablet (200 mg total) by mouth 2 (two) times daily.  Dispense: 60 tablet; Refill: 3  3. Nausea and vomiting during pregnancy Bonjesta samples given, also prescribed. Will follow up response. - Doxylamine-Pyridoxine ER (BONJESTA) 20-20 MG TBCR; Take 1 tablet by mouth at bedtime as needed.  Dispense: 60 tablet; Refill: 1   Routine preventative health maintenance measures emphasized. Please refer to After Visit Summary for other counseling  recommendations.   Return in about 4 weeks (around 02/09/2019) for New OB Visit.    Total face-to-face time with patient: 15 minutes.  Over 50% of encounter was spent on counseling and coordination of care.   Verita Schneiders,  MD, [place] for [employer], [place]

## 2019-01-15 ENCOUNTER — Encounter: Payer: Self-pay | Admitting: *Deleted

## 2019-01-15 ENCOUNTER — Other Ambulatory Visit: Payer: Self-pay | Admitting: *Deleted

## 2019-01-15 MED ORDER — DOXYLAMINE-PYRIDOXINE 10-10 MG PO TBEC: 2.0000 | DELAYED_RELEASE_TABLET | Freq: Every day | ORAL | 5 refills | Status: DC

## 2019-01-20 ENCOUNTER — Ambulatory Visit: Admit: 2019-01-20 | Payer: BC Managed Care – PPO | Admitting: Obstetrics & Gynecology

## 2019-01-20 SURGERY — EXCISION, CYST, OVARY, LAPAROSCOPIC
Anesthesia: Choice | Laterality: Left

## 2019-01-22 ENCOUNTER — Other Ambulatory Visit: Payer: Self-pay | Admitting: *Deleted

## 2019-01-22 MED ORDER — NITROFURANTOIN MONOHYD MACRO 100 MG PO CAPS: 100.0000 mg | ORAL_CAPSULE | Freq: Two times a day (BID) | ORAL | 0 refills | Status: DC

## 2019-01-29 ENCOUNTER — Other Ambulatory Visit: Payer: Self-pay

## 2019-02-03 ENCOUNTER — Encounter: Payer: BC Managed Care – PPO | Admitting: Family Medicine

## 2019-02-09 ENCOUNTER — Ambulatory Visit (INDEPENDENT_AMBULATORY_CARE_PROVIDER_SITE_OTHER): Payer: BC Managed Care – PPO | Admitting: Family Medicine

## 2019-02-09 ENCOUNTER — Encounter: Payer: Self-pay | Admitting: Family Medicine

## 2019-02-09 ENCOUNTER — Other Ambulatory Visit: Payer: Self-pay

## 2019-02-09 ENCOUNTER — Other Ambulatory Visit (HOSPITAL_COMMUNITY)
Admission: RE | Admit: 2019-02-09 | Discharge: 2019-02-09 | Disposition: A | Discharge status: Home / Self Care | Payer: BC Managed Care – PPO | Source: Ambulatory Visit | Attending: Family Medicine | Admitting: Family Medicine

## 2019-02-09 VITALS — BP 136/80 | HR 91 | Wt 330.0 lb

## 2019-02-09 DIAGNOSIS — O0990 Supervision of high risk pregnancy, unspecified, unspecified trimester: Secondary | ICD-10-CM | POA: Diagnosis present

## 2019-02-09 DIAGNOSIS — Z3A11 11 weeks gestation of pregnancy: Secondary | ICD-10-CM

## 2019-02-09 DIAGNOSIS — F329 Major depressive disorder, single episode, unspecified: Secondary | ICD-10-CM

## 2019-02-09 DIAGNOSIS — O10911 Unspecified pre-existing hypertension complicating pregnancy, first trimester: Secondary | ICD-10-CM

## 2019-02-09 DIAGNOSIS — O99011 Anemia complicating pregnancy, first trimester: Secondary | ICD-10-CM

## 2019-02-09 DIAGNOSIS — O99211 Obesity complicating pregnancy, first trimester: Secondary | ICD-10-CM

## 2019-02-09 DIAGNOSIS — O219 Vomiting of pregnancy, unspecified: Secondary | ICD-10-CM

## 2019-02-09 DIAGNOSIS — O10919 Unspecified pre-existing hypertension complicating pregnancy, unspecified trimester: Secondary | ICD-10-CM

## 2019-02-09 DIAGNOSIS — D271 Benign neoplasm of left ovary: Secondary | ICD-10-CM

## 2019-02-09 DIAGNOSIS — O99341 Other mental disorders complicating pregnancy, first trimester: Secondary | ICD-10-CM

## 2019-02-09 DIAGNOSIS — F32A Depression, unspecified: Secondary | ICD-10-CM

## 2019-02-09 DIAGNOSIS — O0991 Supervision of high risk pregnancy, unspecified, first trimester: Secondary | ICD-10-CM

## 2019-02-09 DIAGNOSIS — O09899 Supervision of other high risk pregnancies, unspecified trimester: Secondary | ICD-10-CM

## 2019-02-09 DIAGNOSIS — F419 Anxiety disorder, unspecified: Secondary | ICD-10-CM

## 2019-02-09 MED ORDER — ASPIRIN EC 81 MG PO TBEC: 162.0000 mg | DELAYED_RELEASE_TABLET | Freq: Every day | ORAL | 3 refills | Status: DC

## 2019-02-09 NOTE — Progress Notes (Signed)
Subjective:   Emmylou Bieker is a 23 y.o. G2P1001 at 104w3d by early ultrasound being seen today for her first obstetrical visit.  Her obstetrical history is significant for obesity, pre-eclampsia and CHTN. Patient does intend to breast feed. Pregnancy history fully reviewed.  Patient reports nausea.  HISTORY: OB History  Gravida Para Term Preterm AB Living  2 1 1  0 0 1  SAB TAB Ectopic Multiple Live Births  0 0 0 0 1    # Outcome Date GA Lbr Len/2nd Weight Sex Delivery Anes PTL Lv  2 Current           1 Term 05/28/18 [redacted]w[redacted]d 02:00 / 00:13 7 lb 6.8 oz (3.368 kg) M Vag-Spont EPI  LIV     Name: Kloss,BOY Graclyn     Apgar1: 7  Apgar5: 9   Last pap smear was  10/2017 and was normal Past Medical History:  Diagnosis Date  . Allergy   . Anxiety   . Depression   . Dermoid cyst of left ovary 11/04/2017  . Elevated liver enzymes   . Frequent headaches   . History of pre-eclampsia 06/26/2018  . Hypertension   . PCOS (polycystic ovarian syndrome)   . Pre-eclampsia 05/19/2018   Past Surgical History:  Procedure Laterality Date  . TOOTH EXTRACTION     Family History  Problem Relation Age of Onset  . Depression Mother   . Anxiety disorder Mother   . Heart disease Mother   . Skin cancer Mother   . Scoliosis Mother   . Alcohol abuse Father   . Bipolar disorder Father   . Depression Father   . Osteochondroma Father   . COPD Maternal Grandmother   . Scoliosis Maternal Grandmother   . COPD Maternal Grandfather   . Skin cancer Maternal Grandfather   . Arthritis Paternal Grandmother   . Bipolar disorder Paternal Grandfather   . Osteochondroma Sister   . Osteochondroma Brother   . Cancer Neg Hx    Social History   Tobacco Use  . Smoking status: Never Smoker  . Smokeless tobacco: Never Used  Substance Use Topics  . Alcohol use: Not Currently  . Drug use: No   Allergies  Allergen Reactions  . Etodolac Nausea Only  . Meloxicam Other (See Comments)    Abdominal pain  .  Flexeril [Cyclobenzaprine] Nausea Only  . Nsaids Other (See Comments)    Stomach pain  . Prozac [Fluoxetine Hcl] Other (See Comments)    Felt bad/awful   Current Outpatient Medications on File Prior to Visit  Medication Sig Dispense Refill  . Doxylamine-Pyridoxine (DICLEGIS) 10-10 MG TBEC Take 2 tablets by mouth at bedtime. If symptoms persist, add one tablet in the morning and one in the afternoon 100 tablet 5  . labetalol (NORMODYNE) 200 MG tablet Take 1 tablet (200 mg total) by mouth 2 (two) times daily. 60 tablet 3  . Prenatal Vit-Fe Fumarate-FA (PRENATAL MULTIVITAMIN) TABS tablet Take 1 tablet by mouth daily at 12 noon.    . [DISCONTINUED] busPIRone (BUSPAR) 10 MG tablet Take 1 tablet (10 mg total) by mouth 3 (three) times daily. 90 tablet 1   No current facility-administered medications on file prior to visit.      Exam   Vitals:   02/09/19 1318  BP: 136/80  Pulse: 91  Weight: (!) 330 lb (149.7 kg)   Fetal Heart Rate (bpm): 178  System: General: well-developed, well-nourished female in no acute distress   Skin: normal  coloration and turgor, no rashes   Neurologic: oriented, normal, negative, normal mood   Extremities: normal strength, tone, and muscle mass, ROM of all joints is normal   HEENT PERRLA, extraocular movement intact and sclera clear, anicteric   Mouth/Teeth mucous membranes moist, pharynx normal without lesions and dental hygiene good   Neck supple and no masses   Cardiovascular: regular rate and rhythm   Respiratory:  no respiratory distress, normal breath sounds   Abdomen: soft, non-tender; bowel sounds normal; no masses,  no organomegaly     Assessment:   Pregnancy: G2P1001 Patient Active Problem List   Diagnosis Date Noted  . Nausea and vomiting during pregnancy 01/12/2019  . Chronic hypertension complicating pregnancy, antepartum 04/24/2018  . Short interval between pregnancies affecting pregnancy, antepartum 11/04/2017  . Dermoid cyst of left  ovary 11/04/2017  . PCOS (polycystic ovarian syndrome) 10/03/2017  . Obesity, Class III, BMI 40-49.9 (morbid obesity) ([place]) 04/03/2017  . Seasonal allergies 09/18/2016  . Anxiety and depression 09/18/2016  . Frequent headaches 09/18/2016     Plan:  1. Supervision of high risk pregnancy, antepartum New OB labs - Obstetric Panel, Including HIV - Culture, OB Urine - Genetic Screening - GC/Chlamydia probe amp ()not at [facility] - [place] MFM OB DETAIL +14 WK; Future - Enroll Patient in Babyscripts - Babyscripts Schedule Optimization - Hemoglobin A1c  2. Chronic hypertension complicating pregnancy, antepartum On Labetalol--BP is well controlled today, ASA added on 02/24/2019 Baseline labs - Comprehensive metabolic panel - Protein / creatinine ratio, urine - TSH - aspirin EC 81 MG tablet; Take 2 tablets (162 mg total) by mouth daily.  Dispense: 180 tablet; Refill: 3  3. Obesity, Class III, BMI 40-49.9 (morbid obesity) ([facility]) - TSH - Hemoglobin A1c  4. Dermoid cyst of left ovary F/u at anatomy US  5. Nausea and vomiting during pregnancy On Bonjesta   Initial labs drawn. Continue prenatal vitamins. Genetic Screening discussed, NIPS: ordered. Ultrasound discussed; fetal anatomic survey: ordered. Problem list reviewed and updated. The nature of [place] with multiple MDs and other Advanced Practice Providers was explained to patient; also emphasized that residents, students are part of our team. Routine obstetric precautions reviewed. Return in 4 weeks (on 03/09/2019).

## 2019-02-09 NOTE — Patient Instructions (Signed)

## 2019-02-09 NOTE — Progress Notes (Signed)
Last PAP in 2019, [facility]

## 2019-02-10 LAB — COMPREHENSIVE METABOLIC PANEL
ALT: 24 IU/L (ref 0–32)
AST: 18 IU/L (ref 0–40)
Albumin/Globulin Ratio: 1.2 (ref 1.2–2.2)
Albumin: 3.9 g/dL (ref 3.9–5.0)
Alkaline Phosphatase: 106 IU/L (ref 39–117)
BUN/Creatinine Ratio: 12 (ref 9–23)
BUN: 6 mg/dL (ref 6–20)
Bilirubin Total: <0.2 mg/dL (ref 0.0–1.2)
CO2: 19 mmol/L — ABNORMAL LOW (ref 20–29)
Calcium: 9.3 mg/dL (ref 8.7–10.2)
Chloride: 101 mmol/L (ref 96–106)
Creatinine, Ser: 0.49 mg/dL — ABNORMAL LOW (ref 0.57–1.00)
GFR calc Af Amer: 157 mL/min/{1.73_m2} (ref >59)
GFR calc non Af Amer: 136 mL/min/{1.73_m2} (ref >59)
Globulin, Total: 3.3 g/dL (ref 1.5–4.5)
Glucose: 108 mg/dL — ABNORMAL HIGH (ref 65–99)
Potassium: 4.1 mmol/L (ref 3.5–5.2)
Sodium: 136 mmol/L (ref 134–144)
Total Protein: 7.2 g/dL (ref 6.0–8.5)

## 2019-02-10 LAB — OBSTETRIC PANEL, INCLUDING HIV
Antibody Screen: NEGATIVE
Basophils Absolute: 0 10*3/uL (ref 0.0–0.2)
Basos: 0 %
EOS (ABSOLUTE): 0.2 10*3/uL (ref 0.0–0.4)
Eos: 2 %
HIV Screen 4th Generation wRfx: NONREACTIVE
Hematocrit: 34.2 % (ref 34.0–46.6)
Hemoglobin: 10.5 g/dL — ABNORMAL LOW (ref 11.1–15.9)
Hepatitis B Surface Ag: NEGATIVE
Immature Grans (Abs): 0 10*3/uL (ref 0.0–0.1)
Immature Granulocytes: 0 %
Lymphocytes Absolute: 2.5 10*3/uL (ref 0.7–3.1)
Lymphs: 24 %
MCH: 21.3 pg — ABNORMAL LOW (ref 26.6–33.0)
MCHC: 30.7 g/dL — ABNORMAL LOW (ref 31.5–35.7)
MCV: 69 fL — ABNORMAL LOW (ref 79–97)
Monocytes Absolute: 0.4 10*3/uL (ref 0.1–0.9)
Monocytes: 4 %
Neutrophils Absolute: 7.1 10*3/uL — ABNORMAL HIGH (ref 1.4–7.0)
Neutrophils: 70 %
Platelets: 310 10*3/uL (ref 150–450)
RBC: 4.93 x10E6/uL (ref 3.77–5.28)
RDW: 18.2 % — ABNORMAL HIGH (ref 11.7–15.4)
RPR Ser Ql: NONREACTIVE
Rh Factor: POSITIVE
Rubella Antibodies, IGG: 1.01 index (ref >0.99)
WBC: 10.3 10*3/uL (ref 3.4–10.8)

## 2019-02-10 LAB — HEMOGLOBIN A1C
Est. average glucose Bld gHb Est-mCnc: 103 mg/dL
Hgb A1c MFr Bld: 5.2 % (ref 4.8–5.6)

## 2019-02-10 LAB — TSH: TSH: 1.76 u[IU]/mL (ref 0.450–4.500)

## 2019-02-10 LAB — PROTEIN / CREATININE RATIO, URINE
Creatinine, Urine: 189.9 mg/dL
Protein, Ur: 21.6 mg/dL
Protein/Creat Ratio: 114 mg/g creat (ref 0–200)

## 2019-02-10 LAB — GC/CHLAMYDIA PROBE AMP (~~LOC~~) NOT AT ARMC
Chlamydia: NEGATIVE
Neisseria Gonorrhea: NEGATIVE

## 2019-02-11 DIAGNOSIS — O99019 Anemia complicating pregnancy, unspecified trimester: Secondary | ICD-10-CM | POA: Insufficient documentation

## 2019-02-11 DIAGNOSIS — O09899 Supervision of other high risk pregnancies, unspecified trimester: Secondary | ICD-10-CM | POA: Insufficient documentation

## 2019-02-11 LAB — CULTURE, OB URINE

## 2019-02-11 LAB — URINE CULTURE, OB REFLEX

## 2019-02-11 MED ORDER — FERROUS SULFATE 325 (65 FE) MG PO TABS: 325.0000 mg | ORAL_TABLET | Freq: Every day | ORAL | 1 refills | Status: DC

## 2019-02-11 NOTE — Addendum Note (Signed)
Addended by: Donnamae Jude on: 02/11/2019 07:48 AM   Modules accepted: Orders

## 2019-02-24 ENCOUNTER — Other Ambulatory Visit: Payer: Self-pay

## 2019-02-24 ENCOUNTER — Ambulatory Visit (INDEPENDENT_AMBULATORY_CARE_PROVIDER_SITE_OTHER): Payer: BC Managed Care – PPO | Admitting: Obstetrics & Gynecology

## 2019-02-24 VITALS — BP 124/84 | HR 78 | Wt 330.1 lb

## 2019-02-24 DIAGNOSIS — O10911 Unspecified pre-existing hypertension complicating pregnancy, first trimester: Secondary | ICD-10-CM

## 2019-02-24 DIAGNOSIS — Z23 Encounter for immunization: Secondary | ICD-10-CM

## 2019-02-24 DIAGNOSIS — O0990 Supervision of high risk pregnancy, unspecified, unspecified trimester: Secondary | ICD-10-CM

## 2019-02-24 DIAGNOSIS — Z3A13 13 weeks gestation of pregnancy: Secondary | ICD-10-CM

## 2019-02-24 DIAGNOSIS — D271 Benign neoplasm of left ovary: Secondary | ICD-10-CM

## 2019-02-24 DIAGNOSIS — O10919 Unspecified pre-existing hypertension complicating pregnancy, unspecified trimester: Secondary | ICD-10-CM

## 2019-02-24 DIAGNOSIS — O09899 Supervision of other high risk pregnancies, unspecified trimester: Secondary | ICD-10-CM

## 2019-02-24 DIAGNOSIS — O0991 Supervision of high risk pregnancy, unspecified, first trimester: Secondary | ICD-10-CM

## 2019-02-24 DIAGNOSIS — O99211 Obesity complicating pregnancy, first trimester: Secondary | ICD-10-CM

## 2019-02-24 DIAGNOSIS — F439 Reaction to severe stress, unspecified: Secondary | ICD-10-CM

## 2019-02-24 NOTE — Progress Notes (Signed)
   PRENATAL VISIT NOTE  Subjective:  Latoya Mora is a 23 y.o. G2P1001 at [redacted]w[redacted]d being seen today for ongoing prenatal care.  She is currently monitored for the following issues for this high-risk pregnancy and has Seasonal allergies; Anxiety and depression; Frequent headaches; Obesity, Class III, BMI 40-49.9 (morbid obesity) ([place]); PCOS (polycystic ovarian syndrome); Supervision of high risk pregnancy, antepartum; Dermoid cyst of left ovary; Chronic hypertension complicating pregnancy, antepartum; Nausea and vomiting during pregnancy; Anemia in pregnancy; and Short interval between pregnancies affecting pregnancy, antepartum on their problem list.  Patient reports having nightmares that she is losing motor function, speech function. almost like a panic attack, almost feels drugged in her dreams. . These started prior to pregnancy, she stopped taking her effexor and buspar when she found out that she was pregnant She isn't ready to restart those now She denies HI/SI  .  Marland Kitchen  Movement: Absent. Denies leaking of fluid.   The following portions of the patient's history were reviewed and updated as appropriate: allergies, current medications, past family history, past medical history, past social history, past surgical history and problem list.   Objective:   Vitals:   02/24/19 1015  BP: 124/84  Pulse: 78  Weight: (!) 330 lb 1.6 oz (149.7 kg)    Fetal Status: Fetal Heart Rate (bpm): 166   Movement: Absent     General:  Alert, oriented and cooperative. Patient is in no acute distress.  Skin: Skin is warm and dry. No rash noted.   Cardiovascular: Normal heart rate noted  Respiratory: Normal respiratory effort, no problems with respiration noted  Abdomen: Soft, gravid, appropriate for gestational age.  Pain/Pressure: Absent     Pelvic: Cervical exam deferred        Extremities: Normal range of motion.  Edema: None  Mental Status: Normal mood and affect. Normal behavior. Normal judgment and thought  content.   Assessment and Plan:  Pregnancy: G2P1001 at [redacted]w[redacted]d 1. Supervision of high risk pregnancy, antepartum - flu vaccine today  2. Dermoid cyst of left ovary - discussed torsion precautions  3. Chronic hypertension complicating pregnancy, antepartum - good BP with labetalol BID - rec start baby asa now  4. Obesity, Class III, BMI 40-49.9 (morbid obesity) ([place]) -rec no more than 20 pound weight gain - 2 hour GTT and HBA1C this week (not fasting today)  5. Short interval between pregnancies affecting pregnancy, antepartum - considering contraceptive options  6. Nightmares/panic attacks - I offered psychiatrist and Integrated behaviour health. She wants to start with integrated. Preterm labor symptoms and general obstetric precautions including but not limited to vaginal bleeding, contractions, leaking of fluid and fetal movement were reviewed in detail with the patient. Please refer to After Visit Summary for other counseling recommendations.   Return for this week for 2 hour GTT and HBA1C, ROB visit in person in 4 weeks.  Future Appointments  Date Time Provider [place]  03/09/2019  1:30 PM Lavonia Drafts, MD [facility]  04/06/2019  8:00 AM [place] [facility]  04/06/2019  8:00 AM [facility] [place] 3 [facility]    Emily Filbert, MD

## 2019-02-24 NOTE — Progress Notes (Signed)
.  obso

## 2019-02-24 NOTE — Progress Notes (Signed)
Patient c/o left side pain has gotten worse since she became pregnant.

## 2019-02-25 ENCOUNTER — Other Ambulatory Visit: Payer: BC Managed Care – PPO

## 2019-02-27 ENCOUNTER — Other Ambulatory Visit: Payer: BC Managed Care – PPO

## 2019-02-27 ENCOUNTER — Other Ambulatory Visit: Payer: Self-pay

## 2019-02-27 DIAGNOSIS — O0990 Supervision of high risk pregnancy, unspecified, unspecified trimester: Secondary | ICD-10-CM

## 2019-02-28 LAB — GLUCOSE TOLERANCE, 2 HOURS W/ 1HR
Glucose, 1 hour: 114 mg/dL (ref 65–179)
Glucose, 2 hour: 118 mg/dL (ref 65–152)
Glucose, Fasting: 84 mg/dL (ref 65–91)

## 2019-02-28 LAB — HEMOGLOBIN A1C
Est. average glucose Bld gHb Est-mCnc: 103 mg/dL
Hgb A1c MFr Bld: 5.2 % (ref 4.8–5.6)

## 2019-03-03 ENCOUNTER — Telehealth: Payer: Self-pay | Admitting: Radiology

## 2019-03-03 ENCOUNTER — Encounter: Payer: Self-pay | Admitting: Radiology

## 2019-03-03 NOTE — Telephone Encounter (Signed)
Patient informed of Panorama results

## 2019-03-04 ENCOUNTER — Encounter (HOSPITAL_COMMUNITY): Payer: Self-pay | Admitting: Emergency Medicine

## 2019-03-04 ENCOUNTER — Emergency Department (HOSPITAL_COMMUNITY)
Admission: EM | Admit: 2019-03-04 | Discharge: 2019-03-05 | Disposition: A | Discharge status: Home / Self Care | Payer: BC Managed Care – PPO | Attending: Emergency Medicine | Admitting: Emergency Medicine

## 2019-03-04 ENCOUNTER — Other Ambulatory Visit: Payer: Self-pay

## 2019-03-04 DIAGNOSIS — Z3491 Encounter for supervision of normal pregnancy, unspecified, first trimester: Secondary | ICD-10-CM

## 2019-03-04 DIAGNOSIS — Z3A15 15 weeks gestation of pregnancy: Secondary | ICD-10-CM | POA: Insufficient documentation

## 2019-03-04 DIAGNOSIS — O26892 Other specified pregnancy related conditions, second trimester: Secondary | ICD-10-CM | POA: Diagnosis present

## 2019-03-04 DIAGNOSIS — M79662 Pain in left lower leg: Secondary | ICD-10-CM | POA: Diagnosis not present

## 2019-03-04 DIAGNOSIS — Z79899 Other long term (current) drug therapy: Secondary | ICD-10-CM | POA: Insufficient documentation

## 2019-03-04 DIAGNOSIS — M79605 Pain in left leg: Secondary | ICD-10-CM

## 2019-03-04 DIAGNOSIS — Z7982 Long term (current) use of aspirin: Secondary | ICD-10-CM | POA: Insufficient documentation

## 2019-03-04 DIAGNOSIS — I10 Essential (primary) hypertension: Secondary | ICD-10-CM | POA: Insufficient documentation

## 2019-03-04 NOTE — ED Triage Notes (Signed)
Pt c/o lower left leg pain that started today.

## 2019-03-05 ENCOUNTER — Ambulatory Visit (HOSPITAL_COMMUNITY)
Admission: RE | Admit: 2019-03-05 | Discharge: 2019-03-05 | Disposition: A | Discharge status: Home / Self Care | Payer: BC Managed Care – PPO | Source: Ambulatory Visit | Attending: Emergency Medicine | Admitting: Emergency Medicine

## 2019-03-05 DIAGNOSIS — M79605 Pain in left leg: Secondary | ICD-10-CM

## 2019-03-05 DIAGNOSIS — O26892 Other specified pregnancy related conditions, second trimester: Secondary | ICD-10-CM | POA: Diagnosis not present

## 2019-03-05 MED ORDER — ENOXAPARIN SODIUM 150 MG/ML ~~LOC~~ SOLN
1.0000 mg/kg | Freq: Once | SUBCUTANEOUS | Status: AC
  Administered 2019-03-05: 149.7 mg via SUBCUTANEOUS
  Filled 2019-03-05: qty 1

## 2019-03-05 NOTE — ED Provider Notes (Signed)
[facility] Provider Note   CSN: [id number] Arrival date & time: 03/04/19  2248    History   Chief Complaint Chief Complaint  Patient presents with  . Leg Pain    HPI Latoya Mora is a 23 y.o. female.   The history is provided by the patient.  She has history of polycystic ovary syndrome and is currently pregnant at [redacted] weeks gestation and comes in because of concern for possible DVT.  For the last 2 days, she has had aching in various joints on the left side of her body.  These include her wrist, elbow, fingers, ankle, knee.  Today, she started having pain in her ankle which seemed to spread up into her posterior calf, so she was advised to come here to rule out DVT.  Pain waxes and wanes, currently rated at 4/10.  She denies any swelling.  She denies fever or chills.  She denies any history of rash or tick bite.  She denies exposure to COVID-19.  She denies any pain in her chest or shortness of breath.  She has not taken anything for pain.  Past Medical History:  Diagnosis Date  . Allergy   . Anxiety   . Depression   . Dermoid cyst of left ovary 11/04/2017  . Elevated liver enzymes   . Frequent headaches   . History of pre-eclampsia 06/26/2018  . Hypertension   . PCOS (polycystic ovarian syndrome)   . Pre-eclampsia 05/19/2018    Patient Active Problem List   Diagnosis Date Noted  . Anemia in pregnancy 02/11/2019  . Short interval between pregnancies affecting pregnancy, antepartum 02/11/2019  . Nausea and vomiting during pregnancy 01/12/2019  . Chronic hypertension complicating pregnancy, antepartum 04/24/2018  . Supervision of high risk pregnancy, antepartum 11/04/2017  . Dermoid cyst of left ovary 11/04/2017  . PCOS (polycystic ovarian syndrome) 10/03/2017  . Obesity, Class III, BMI 40-49.9 (morbid obesity) ([place]) 04/03/2017  . Seasonal allergies 09/18/2016  . Anxiety and depression 09/18/2016  . Frequent headaches 09/18/2016    Past Surgical  History:  Procedure Laterality Date  . TOOTH EXTRACTION       OB History    Gravida  2   Para  1   Term  1   Preterm  0   AB  0   Living  1     SAB  0   TAB  0   Ectopic  0   Multiple  0   Live Births  1            Home Medications    Prior to Admission medications   Medication Sig Start Date End Date Taking? Authorizing Provider  aspirin EC 81 MG tablet Take 2 tablets (162 mg total) by mouth daily. 02/09/19   Donnamae Jude, MD  Doxylamine-Pyridoxine (DICLEGIS) 10-10 MG TBEC Take 2 tablets by mouth at bedtime. If symptoms persist, add one tablet in the morning and one in the afternoon 01/15/19   Anyanwu, Laray Anger A, MD  ferrous sulfate (FERROUSUL) 325 (65 FE) MG tablet Take 1 tablet (325 mg total) by mouth daily. 02/11/19   Donnamae Jude, MD  labetalol (NORMODYNE) 200 MG tablet Take 1 tablet (200 mg total) by mouth 2 (two) times daily. 01/12/19   Anyanwu, Sallyanne Havers, MD  Prenatal Vit-Fe Fumarate-FA (PRENATAL MULTIVITAMIN) TABS tablet Take 1 tablet by mouth daily at 12 noon.    [provider]  busPIRone (BUSPAR) 10 MG tablet Take 1 tablet (10  mg total) by mouth 3 (three) times daily. 11/20/18   Osborne Oman, MD    Family History Family History  Problem Relation Age of Onset  . Depression Mother   . Anxiety disorder Mother   . Heart disease Mother   . Skin cancer Mother   . Scoliosis Mother   . Alcohol abuse Father   . Bipolar disorder Father   . Depression Father   . Osteochondroma Father   . COPD Maternal Grandmother   . Scoliosis Maternal Grandmother   . COPD Maternal Grandfather   . Skin cancer Maternal Grandfather   . Arthritis Paternal Grandmother   . Bipolar disorder Paternal Grandfather   . Osteochondroma Sister   . Osteochondroma Brother   . Cancer Neg Hx     Social History Social History   Tobacco Use  . Smoking status: Never Smoker  . Smokeless tobacco: Never Used  Substance Use Topics  . Alcohol use: Not Currently  . Drug  use: No     Allergies   Etodolac, Meloxicam, Flexeril [cyclobenzaprine], Nsaids, and Prozac [fluoxetine hcl]   Review of Systems Review of Systems  All other systems reviewed and are negative.    Physical Exam Updated Vital Signs BP 116/89   Pulse (!) 106   Temp 99.5 F (37.5 C)   Resp 17   LMP 11/14/2018   SpO2 100%   Physical Exam Vitals signs and nursing note reviewed.    23 year old female, resting comfortably and in no acute distress. Vital signs are significant for mildly elevated heart rate. Oxygen saturation is 100%, which is normal. Head is normocephalic and atraumatic. PERRLA, EOMI. Oropharynx is clear. Neck is nontender and supple without adenopathy or JVD. Back is nontender and there is no CVA tenderness. Lungs are clear without rales, wheezes, or rhonchi. Chest is nontender. Heart has regular rate and rhythm without murmur. Abdomen is soft, flat, nontender without masses or hepatosplenomegaly and peristalsis is normoactive. Extremities have no cyanosis or edema, full range of motion is present.  There is no joint swelling.  There is no calf tenderness.  Homans sign is negative.  Calf circumference is symmetric. Skin is warm and dry without rash. Neurologic: Mental status is normal, cranial nerves are intact, there are no motor or sensory deficits.  ED Treatments / Results   Procedures Procedures (including critical care time)  Medications Ordered in ED Medications - No data to display   Initial Impression / Assessment and Plan / ED Course  I have reviewed the triage vital signs and the nursing notes.  Migratory polyarthralgia concerning for possible viral syndrome.  Left calf pain, doubt DVT but will send for venous Doppler in the morning.  First trimester pregnancy.  Old records are reviewed confirming that she is being monitored as a high risk pregnancy.  She is given a dose of enoxaparin and will be brought back for venous Doppler in the morning.   Final Clinical Impressions(s) / ED Diagnoses   Final diagnoses:  Pain of left calf  First trimester pregnancy    ED Discharge Orders         Ordered    US Venous Img Lower Unilateral Left     03/05/19 [id number]           Delora Fuel, MD [id number]

## 2019-03-05 NOTE — ED Provider Notes (Signed)
Blood pressure 107/68, pulse 96, temperature 99.5 F (37.5 C), resp. rate 17, last menstrual period 11/14/2018, SpO2 100 %, unknown if currently breastfeeding.   In short, Latoya Mora is a 23 y.o. female with a chief complaint of Leg Pain .  Refer to the original H&P for additional details.  Patient return to the emergency department for lower extremity ultrasound which was negative for DVT.  Discussed the results with the patient.  She will follow with her OB and PCP moving forward.  Discussed compression and elevation strategies for home.     Margette Fast, MD 03/05/19 1406

## 2019-03-05 NOTE — Discharge Instructions (Addendum)
Take acetaminophen as needed for pain.  Return in the morning for an ultrasound to make sure you do not have a blood clot in your leg.

## 2019-03-09 ENCOUNTER — Encounter: Payer: BC Managed Care – PPO | Admitting: Obstetrics & Gynecology

## 2019-03-24 ENCOUNTER — Ambulatory Visit (INDEPENDENT_AMBULATORY_CARE_PROVIDER_SITE_OTHER): Payer: BC Managed Care – PPO | Admitting: Family Medicine

## 2019-03-24 ENCOUNTER — Other Ambulatory Visit: Payer: Self-pay

## 2019-03-24 VITALS — BP 128/85 | HR 105 | Wt 332.4 lb

## 2019-03-24 DIAGNOSIS — Z3A17 17 weeks gestation of pregnancy: Secondary | ICD-10-CM

## 2019-03-24 DIAGNOSIS — F32A Depression, unspecified: Secondary | ICD-10-CM

## 2019-03-24 DIAGNOSIS — F329 Major depressive disorder, single episode, unspecified: Secondary | ICD-10-CM

## 2019-03-24 DIAGNOSIS — O10912 Unspecified pre-existing hypertension complicating pregnancy, second trimester: Secondary | ICD-10-CM

## 2019-03-24 DIAGNOSIS — O10919 Unspecified pre-existing hypertension complicating pregnancy, unspecified trimester: Secondary | ICD-10-CM

## 2019-03-24 DIAGNOSIS — F419 Anxiety disorder, unspecified: Secondary | ICD-10-CM

## 2019-03-24 DIAGNOSIS — O0990 Supervision of high risk pregnancy, unspecified, unspecified trimester: Secondary | ICD-10-CM

## 2019-03-24 DIAGNOSIS — O0992 Supervision of high risk pregnancy, unspecified, second trimester: Secondary | ICD-10-CM

## 2019-03-24 NOTE — Progress Notes (Signed)
Pt. Experienced vaginal bleeding last night 03/24/2019 with back pain.   Pt. Wants to discuss anxiety and anxiety attacks that are happening at least once a week.  Pt. Already had flu shot

## 2019-03-24 NOTE — Patient Instructions (Signed)

## 2019-03-24 NOTE — Progress Notes (Signed)
    PRENATAL VISIT NOTE  Subjective:  Latoya Mora is a 23 y.o. G2P1001 at [redacted]w[redacted]d being seen today for ongoing prenatal care.  She is currently monitored for the following issues for this high-risk pregnancy and has Seasonal allergies; Anxiety and depression; Frequent headaches; Obesity, Class III, BMI 40-49.9 (morbid obesity) ([place]); PCOS (polycystic ovarian syndrome); Supervision of high risk pregnancy, antepartum; Dermoid cyst of left ovary; Chronic hypertension complicating pregnancy, antepartum; Nausea and vomiting during pregnancy; Anemia in pregnancy; and Short interval between pregnancies affecting pregnancy, antepartum on their problem list.  Patient reports vaginal bleeding, cramping.  Contractions: Not present. Vag. Bleeding: Small.  Movement: Present. Denies leaking of fluid.   The following portions of the patient's history were reviewed and updated as appropriate: allergies, current medications, past family history, past medical history, past social history, past surgical history and problem list.   Objective:   Vitals:   03/24/19 1000  BP: 128/85  Pulse: (!) 105  Weight: (!) 332 lb 6.4 oz (150.8 kg)    Fetal Status: Fetal Heart Rate (bpm): 152   Movement: Present     General:  Alert, oriented and cooperative. Patient is in no acute distress.  Skin: Skin is warm and dry. No rash noted.   Cardiovascular: Normal heart rate noted  Respiratory: Normal respiratory effort, no problems with respiration noted  Abdomen: Soft, gravid, appropriate for gestational age.  Pain/Pressure: Present     Pelvic: Cervical exam performed Dilation: Closed Effacement (%): Thick Station: Ballotable  Extremities: Normal range of motion.  Edema: Trace  Mental Status: Normal mood and affect. Normal behavior. Normal judgment and thought content.   Assessment and Plan:  Pregnancy: G2P1001 at [redacted]w[redacted]d 1. Chronic hypertension complicating pregnancy, antepartum On labetalol and ASA-- BP is well controlled   2. Supervision of high risk pregnancy, antepartum New bleeding, recent intercourse, one day prior-- Threatened Ab precautions reviewed. Pelvic rest, will move up her anatomy US to next week.  3. Anxiety and depression Declines meds, but stress effect on pregnancy reviewed. - Ambulatory referral to [facility]  Preterm labor symptoms and general obstetric precautions including but not limited to vaginal bleeding, contractions, leaking of fluid and fetal movement were reviewed in detail with the patient. Please refer to After Visit Summary for other counseling recommendations.   Return in 4 weeks (on 04/21/2019) for in person.  Future Appointments  Date Time Provider [place]  03/31/2019  2:45 PM [facility] [place] 2 [facility]  03/31/2019  2:50 PM [place] NURSE [place]  04/21/2019  9:00 AM Anyanwu, Sallyanne Havers, MD [facility]    Donnamae Jude, MD

## 2019-03-31 ENCOUNTER — Ambulatory Visit (HOSPITAL_COMMUNITY)
Admission: RE | Admit: 2019-03-31 | Discharge: 2019-03-31 | Disposition: A | Discharge status: Home / Self Care | Payer: BC Managed Care – PPO | Source: Ambulatory Visit | Attending: Obstetrics and Gynecology | Admitting: Obstetrics and Gynecology

## 2019-03-31 ENCOUNTER — Ambulatory Visit (INDEPENDENT_AMBULATORY_CARE_PROVIDER_SITE_OTHER): Payer: Medicaid Other | Admitting: Clinical

## 2019-03-31 ENCOUNTER — Ambulatory Visit (HOSPITAL_COMMUNITY): Payer: BC Managed Care – PPO | Admitting: *Deleted

## 2019-03-31 ENCOUNTER — Other Ambulatory Visit: Payer: Self-pay

## 2019-03-31 ENCOUNTER — Encounter (HOSPITAL_COMMUNITY): Payer: Self-pay

## 2019-03-31 DIAGNOSIS — O99011 Anemia complicating pregnancy, first trimester: Secondary | ICD-10-CM | POA: Insufficient documentation

## 2019-03-31 DIAGNOSIS — O10019 Pre-existing essential hypertension complicating pregnancy, unspecified trimester: Secondary | ICD-10-CM

## 2019-03-31 DIAGNOSIS — O99212 Obesity complicating pregnancy, second trimester: Secondary | ICD-10-CM | POA: Diagnosis not present

## 2019-03-31 DIAGNOSIS — O0990 Supervision of high risk pregnancy, unspecified, unspecified trimester: Secondary | ICD-10-CM

## 2019-03-31 DIAGNOSIS — F419 Anxiety disorder, unspecified: Secondary | ICD-10-CM | POA: Diagnosis not present

## 2019-03-31 DIAGNOSIS — O10012 Pre-existing essential hypertension complicating pregnancy, second trimester: Secondary | ICD-10-CM

## 2019-03-31 DIAGNOSIS — Z3A19 19 weeks gestation of pregnancy: Secondary | ICD-10-CM

## 2019-03-31 DIAGNOSIS — O09292 Supervision of pregnancy with other poor reproductive or obstetric history, second trimester: Secondary | ICD-10-CM

## 2019-03-31 DIAGNOSIS — O09299 Supervision of pregnancy with other poor reproductive or obstetric history, unspecified trimester: Secondary | ICD-10-CM

## 2019-03-31 DIAGNOSIS — Z363 Encounter for antenatal screening for malformations: Secondary | ICD-10-CM

## 2019-03-31 DIAGNOSIS — O09899 Supervision of other high risk pregnancies, unspecified trimester: Secondary | ICD-10-CM

## 2019-03-31 NOTE — BH Specialist Note (Signed)
Integrated Behavioral Health Initial Visit  MRN: [id number] Name: Latoya Mora  Number of [place] Clinician visits:: 1/6 Session Start time: 4:40  Session End time: 5:15 Total time: 35 minutes  Type of Service: [place] Interpretor:No. Interpretor Name and Language: n/a   Warm Hand Off Completed.       SUBJECTIVE: Latoya Mora is a 23 y.o. female accompanied by n/a Patient was referred by Darron Doom, MD for depression/anxiety. Patient reports the following symptoms/concerns: Pt states her primary concern is wanting to prevent postpartum depression as she experienced after last pregnancy; pt prefers no medication at this time for symptoms of anxiety and depression. Pt coped best on Effexor prior to last pregnancy/between pregnancies, and may reconsider medication postpartum.  Duration of problem: Current pregnancy; Severity of problem: moderate  OBJECTIVE: Mood: Normal and Affect: Appropriate Risk of harm to self or others: No plan to harm self or others  LIFE CONTEXT: Family and Social: Pt lives with husband and 40mo son School/Work: - Self-Care: - Life Changes: Current pregnancy  GOALS ADDRESSED: Patient will: 1. Reduce symptoms of: anxiety, depression and compulsive thoughts/nightmares 2. Increase knowledge and/or ability of: coping skills, healthy habits and stress reduction  3. Demonstrate ability to: Increase healthy adjustment to current life circumstances and Increase motivation to adhere to plan of care  INTERVENTIONS: Interventions utilized: Mindfulness or Psychologist, educational and Psychoeducation and/or Health Education  Standardized Assessments completed: Not Needed  ASSESSMENT: Patient currently experiencing Anxiety disorder, unspecified   Patient may benefit from psychoeducation and brief therapeutic interventions regarding coping with symptoms of anxiety and depression .  PLAN: 1. Follow up with  behavioral health clinician on : One month 2. Behavioral recommendations:  -Use modified relaxation breathing/muscle tension exercise twice daily (morning; at bedtime) -Continue using self-coping strategies that have worked in the past, daily, as needed; consider adding apps, as discussed -Continue taking prenatal vitamin daily 3. Referral(s): [place] (In Clinic) 4. "From scale of 1-10, how likely are you to follow plan?": [place], LCSW

## 2019-04-01 ENCOUNTER — Other Ambulatory Visit (HOSPITAL_COMMUNITY): Payer: Self-pay | Admitting: *Deleted

## 2019-04-01 DIAGNOSIS — O10912 Unspecified pre-existing hypertension complicating pregnancy, second trimester: Secondary | ICD-10-CM

## 2019-04-06 ENCOUNTER — Other Ambulatory Visit (HOSPITAL_COMMUNITY): Payer: BC Managed Care – PPO

## 2019-04-06 ENCOUNTER — Ambulatory Visit (HOSPITAL_COMMUNITY): Payer: BC Managed Care – PPO

## 2019-04-08 ENCOUNTER — Telehealth: Payer: BC Managed Care – PPO | Admitting: Family

## 2019-04-08 DIAGNOSIS — R399 Unspecified symptoms and signs involving the genitourinary system: Secondary | ICD-10-CM

## 2019-04-08 DIAGNOSIS — Z3490 Encounter for supervision of normal pregnancy, unspecified, unspecified trimester: Secondary | ICD-10-CM

## 2019-04-08 NOTE — Progress Notes (Signed)
Based on what you shared with me, I feel your condition warrants further evaluation and I recommend that you be seen for a face to face office visit.  Given your symptoms of UTI and you are pregnant you need to be seen face-to-face for a urine culture to make sure that this is a true UTI.  NOTE: If you entered your credit card information for this eVisit, you will not be charged. You may see a "hold" on your card for the $35 but that hold will drop off and you will not have a charge processed.  If you are having a true medical emergency please call 911.     For an urgent face to face visit, [facility] has four urgent care centers for your convenience:   . [facility]    [phone]                  Get Driving Directions  [id number] [place], [place] [address] . 10 am to 8 pm Monday-Friday . 12 pm to 8 pm Saturday-Sunday   . [facility] at [place]                  Get Driving Directions  [phone] [address] [place], [place], [place] [address] . 8 am to 8 pm Monday-Friday . 9 am to 6 pm Saturday . 11 am to 6 pm Sunday   . [facility] at [place]                  Get Driving Directions   [address] [place], [place] [address] . 8 am to 8 pm Monday-Friday . 8 am to 4 pm Saturday-Sunday    . [facility] at [place]                    Get Driving Directions  [id number]  [address] [place], [place] [address]  . Monday-Friday, 12 PM to 6 PM    Your e-visit answers were reviewed by a board certified advanced clinical practitioner to complete your personal care plan.  Thank you for using e-Visits.

## 2019-04-21 ENCOUNTER — Other Ambulatory Visit: Payer: Self-pay

## 2019-04-21 ENCOUNTER — Ambulatory Visit (INDEPENDENT_AMBULATORY_CARE_PROVIDER_SITE_OTHER): Payer: BC Managed Care – PPO | Admitting: Obstetrics & Gynecology

## 2019-04-21 VITALS — BP 123/83 | HR 78 | Wt 332.2 lb

## 2019-04-21 DIAGNOSIS — O0990 Supervision of high risk pregnancy, unspecified, unspecified trimester: Secondary | ICD-10-CM

## 2019-04-21 DIAGNOSIS — O0992 Supervision of high risk pregnancy, unspecified, second trimester: Secondary | ICD-10-CM

## 2019-04-21 DIAGNOSIS — O99891 Other specified diseases and conditions complicating pregnancy: Secondary | ICD-10-CM

## 2019-04-21 DIAGNOSIS — O10912 Unspecified pre-existing hypertension complicating pregnancy, second trimester: Secondary | ICD-10-CM

## 2019-04-21 DIAGNOSIS — O10919 Unspecified pre-existing hypertension complicating pregnancy, unspecified trimester: Secondary | ICD-10-CM

## 2019-04-21 DIAGNOSIS — Z3A22 22 weeks gestation of pregnancy: Secondary | ICD-10-CM

## 2019-04-21 DIAGNOSIS — N393 Stress incontinence (female) (male): Secondary | ICD-10-CM | POA: Insufficient documentation

## 2019-04-21 NOTE — Progress Notes (Signed)
PRENATAL VISIT NOTE  Subjective:  Latoya Mora is a 23 y.o. G2P1001 at [redacted]w[redacted]d being seen today for ongoing prenatal care.  She is currently monitored for the following issues for this high-risk pregnancy and has Seasonal allergies; Anxiety and depression; Frequent headaches; Obesity, Class III, BMI 40-49.9 (morbid obesity) ([place]); PCOS (polycystic ovarian syndrome); Supervision of high risk pregnancy, antepartum; Dermoid cyst of left ovary; Chronic hypertension complicating pregnancy, antepartum; Nausea and vomiting during pregnancy; Anemia in pregnancy; Short interval between pregnancies affecting pregnancy, antepartum; and Stress incontinence during pregnancy on their problem list.  Patient reports worsening urinary incontinence, has to wear Poise pads everyday.  No dysuria, suprapubic pain or other symptoms.    Contractions: Not present.  .  Movement: Present. Denies leaking of fluid.   The following portions of the patient's history were reviewed and updated as appropriate: allergies, current medications, past family history, past medical history, past social history, past surgical history and problem list.   Objective:   Vitals:   04/21/19 0909  BP: 123/83  Pulse: 78  Weight: (!) 332 lb 3.2 oz (150.7 kg)    Fetal Status: Fetal Heart Rate (bpm): 134   Movement: Present     General:  Alert, oriented and cooperative. Patient is in no acute distress.  Skin: Skin is warm and dry. No rash noted.   Cardiovascular: Normal heart rate noted  Respiratory: Normal respiratory effort, no problems with respiration noted  Abdomen: Soft, gravid, appropriate for gestational age.  Pain/Pressure: Absent     Pelvic: Cervical exam deferred        Extremities: Normal range of motion.  Edema: None  Mental Status: Normal mood and affect. Normal behavior. Normal judgment and thought content.   Imaging: [place] Mfm Ob Detail +14 Wk  Result Date:  03/31/2019 ----------------------------------------------------------------------  OBSTETRICS REPORT                    (Corrected Final 03/31/2019 04:14 pm) ---------------------------------------------------------------------- Patient Info  ID #:       [id number]                          D.O.B.:  03/09/1996 (23 yrs)  Name:       Latoya Mora                    Visit Date: 03/31/2019 03:50 pm ---------------------------------------------------------------------- Performed By  Performed By:     Rodrigo Ran BS      Ref. Address:     [address]                                                             [address]  Attending:        Sander Nephew      [facility]                    MD  Fetal Care  Referred By:      [facility] ---------------------------------------------------------------------- Orders   #  Description                          Code         Ordered By   1  [place] MFM OB DETAIL +14 WK              76811.01     Darron Doom  ----------------------------------------------------------------------   #  Order #                    Accession #                 Episode #   1  [id number]                  [id number]                  [id number]  ---------------------------------------------------------------------- Indications   Antenatal screening for malformations          123456   Obesity complicating pregnancy, second         O99.212   trimester (pregravid BMI 50)   Hypertension - Chronic/Pre-existing            O10.019   (labetalol)   Poor obstetric history: Previous preeclampsia  O09.299   [redacted] weeks gestation of pregnancy                Z3A.19  ---------------------------------------------------------------------- Fetal Evaluation  Num Of Fetuses:         1  Fetal Heart Rate(bpm):  154  Cardiac Activity:       Observed  Presentation:           Breech  Placenta:               Posterior  P. Cord Insertion:       Visualized  Amniotic Fluid  AFI FV:      Within normal limits                              Largest Pocket(cm)                              5.49 ---------------------------------------------------------------------- Biometry  BPD:      45.2  mm     G. Age:  19w 5d         78  %    CI:        74.51   %    70 - 86                                                          FL/HC:      14.6   %    16.1 - 18.3  HC:      166.2  mm     G. Age:  19w 2d         58  %    HC/AC:      1.17        1.09 - 1.39  AC:      141.7  mm     G. Age:  19w 4d         64  %    FL/BPD:     53.8   %  FL:       24.3  mm     G. Age:  17w 2d          3  %    FL/AC:      17.1   %    20 - 24  HUM:      25.6  mm     G. Age:  18w 0d         23  %  Est. FW:     249  gm      0 lb 9 oz     25  % ---------------------------------------------------------------------- OB History  Gravidity:    2         Term:   1        Prem:   0        SAB:   0  TOP:          0       Ectopic:  0        Living: 1 ---------------------------------------------------------------------- Gestational Age  LMP:           19w 4d        Date:  11/14/18                 EDD:   08/21/19  U/S Today:     19w 0d                                        EDD:   08/25/19  Best:          19w 0d     Det. By:  U/S (03/31/19)           EDD:   08/25/19 ---------------------------------------------------------------------- Anatomy  Cranium:               Appears normal         LVOT:                   Appears normal  Cavum:                 Not well visualized    Aortic Arch:            Not well visualized  Ventricles:            Not well visualized    Ductal Arch:            Not well visualized  Choroid Plexus:        Appears normal         Diaphragm:              Appears normal  Cerebellum:            Not well visualized    Stomach:                Appears normal, left  sided  Posterior Fossa:       Not well visualized    Abdomen:                 Appears normal  Nuchal Fold:           Not well visualized    Abdominal Wall:         Appears nml (cord                                                                        insert, abd wall)  Face:                  Profile nl; orbits not Cord Vessels:           Appears normal (3                         well visualized                                vessel cord)  Lips:                  Not well visualized    Kidneys:                Not well visualized  Palate:                Not well visualized    Bladder:                Appears normal  Thoracic:              Appears normal         Spine:                  Not well visualized  Heart:                 Not well visualized    Upper Extremities:      Visualized  RVOT:                  Not well visualized    Lower Extremities:      Visualized  Other:  5th digits visualized. Nasal bone visualized. Technically difficult due          to maternal habitus and fetal position. ---------------------------------------------------------------------- Cervix Uterus Adnexa  Cervix  Length:            3.1  cm.  Normal appearance by transabdominal scan.  Left Ovary  Known complex ovarian dermoid. Ovary measures 9.6 x 7.9 x 5.3 cm.  Arterial and venous flow noted.  Right Ovary  Within normal limits.  Cul De Sac  No free fluid seen.  Adnexa  No abnormality visualized. ---------------------------------------------------------------------- Impression  Normal interval growth.  No ultrasonic evidence of structural  fetal anomalies.  Dating by today's examination  Dermoid ovary 9.6 x 7.9 x 5.3 cm  Suboptimal views of the fetal anatomy was obtained  secondary position.  BMI > 50  Chronic hypertension with medication ---------------------------------------------------------------------- Recommendations  Follow up growth in 4 weeks. ----------------------------------------------------------------------  Sander Nephew, MD Electronically Signed Corrected Final  Report  03/31/2019 04:14 pm ----------------------------------------------------------------------   Assessment and Plan:  Pregnancy: G2P1001 at [redacted]w[redacted]d 1. Chronic hypertension complicating pregnancy, antepartum Stable BP.  Checks BP every week, told to write them down and bring to appointments. Has bad internet connections, unable to easily access MyChart or Babyscripts.  Follow up scans ordered as per MFM, antenatal testing later in pregnancy.  2. Stress incontinence during pregnancy No infection symptoms, but will check urine culture. Told to do Kegel exercises to see if this will help.   Risk factors: short interval since last birth (she reports having this also during last pregnancy, but it is worse this time), BMI >50, enlarging uterus and large dermoid effect on bladder.  Will continue to monitor symptoms.   - Culture, OB Urine  3. Supervision of high risk pregnancy, antepartum Preterm labor symptoms and general obstetric precautions including but not limited to vaginal bleeding, contractions, leaking of fluid and fetal movement were reviewed in detail with the patient. Please refer to After Visit Summary for other counseling recommendations.   Return in about 4 weeks (around 05/19/2019) for OFFICE OB Visit.  Future Appointments  Date Time Provider [place]  04/28/2019  9:15 AM [place] [facility]  04/28/2019  9:15 AM [place] 4 [facility]  04/28/2019 10:15 AM [place]  05/19/2019  9:00 AM Aletha Halim, MD [facility]    Verita Schneiders, MD

## 2019-04-21 NOTE — Patient Instructions (Signed)
Return to office for any scheduled appointments. Call the office or go to the MAU at [facility] if:  You begin to have strong, frequent contractions  Your water breaks.  Sometimes it is a big gush of fluid, sometimes it is just a trickle that keeps getting your panties wet or running down your legs  You have vaginal bleeding.  It is normal to have a small amount of spotting if your cervix was checked.   You do not feel your baby moving like normal.  If you do not, get something to eat and drink and lay down and focus on feeling your baby move.   If your baby is still not moving like normal, you should call the office or go to MAU.  Any other obstetric concerns.   

## 2019-04-23 LAB — URINE CULTURE, OB REFLEX

## 2019-04-23 LAB — CULTURE, OB URINE

## 2019-04-28 ENCOUNTER — Encounter (HOSPITAL_COMMUNITY): Payer: Self-pay

## 2019-04-28 ENCOUNTER — Ambulatory Visit: Payer: BC Managed Care – PPO | Admitting: Clinical

## 2019-04-28 ENCOUNTER — Other Ambulatory Visit: Payer: Self-pay

## 2019-04-28 ENCOUNTER — Ambulatory Visit (HOSPITAL_COMMUNITY)
Admission: RE | Admit: 2019-04-28 | Discharge: 2019-04-28 | Disposition: A | Discharge status: Home / Self Care | Payer: BC Managed Care – PPO | Source: Ambulatory Visit | Attending: Maternal & Fetal Medicine | Admitting: Maternal & Fetal Medicine

## 2019-04-28 ENCOUNTER — Other Ambulatory Visit (HOSPITAL_COMMUNITY): Payer: Self-pay | Admitting: *Deleted

## 2019-04-28 ENCOUNTER — Ambulatory Visit (HOSPITAL_COMMUNITY): Payer: BC Managed Care – PPO | Admitting: *Deleted

## 2019-04-28 DIAGNOSIS — O09292 Supervision of pregnancy with other poor reproductive or obstetric history, second trimester: Secondary | ICD-10-CM | POA: Diagnosis not present

## 2019-04-28 DIAGNOSIS — O3482 Maternal care for other abnormalities of pelvic organs, second trimester: Secondary | ICD-10-CM

## 2019-04-28 DIAGNOSIS — Z362 Encounter for other antenatal screening follow-up: Secondary | ICD-10-CM

## 2019-04-28 DIAGNOSIS — O10012 Pre-existing essential hypertension complicating pregnancy, second trimester: Secondary | ICD-10-CM

## 2019-04-28 DIAGNOSIS — O09899 Supervision of other high risk pregnancies, unspecified trimester: Secondary | ICD-10-CM

## 2019-04-28 DIAGNOSIS — Z3A23 23 weeks gestation of pregnancy: Secondary | ICD-10-CM

## 2019-04-28 DIAGNOSIS — O99212 Obesity complicating pregnancy, second trimester: Secondary | ICD-10-CM

## 2019-04-28 DIAGNOSIS — D279 Benign neoplasm of unspecified ovary: Secondary | ICD-10-CM

## 2019-04-28 DIAGNOSIS — O0990 Supervision of high risk pregnancy, unspecified, unspecified trimester: Secondary | ICD-10-CM | POA: Insufficient documentation

## 2019-04-28 DIAGNOSIS — O10912 Unspecified pre-existing hypertension complicating pregnancy, second trimester: Secondary | ICD-10-CM | POA: Diagnosis present

## 2019-04-28 DIAGNOSIS — O10919 Unspecified pre-existing hypertension complicating pregnancy, unspecified trimester: Secondary | ICD-10-CM

## 2019-04-28 DIAGNOSIS — O09892 Supervision of other high risk pregnancies, second trimester: Secondary | ICD-10-CM

## 2019-04-28 NOTE — BH Specialist Note (Signed)
No show

## 2019-05-03 ENCOUNTER — Other Ambulatory Visit: Payer: Self-pay

## 2019-05-03 ENCOUNTER — Encounter: Payer: Self-pay | Admitting: Certified Nurse Midwife

## 2019-05-03 ENCOUNTER — Observation Stay
Admission: EM | Admit: 2019-05-03 | Discharge: 2019-05-03 | Disposition: A | Discharge status: Home / Self Care | Payer: BC Managed Care – PPO | Attending: Certified Nurse Midwife | Admitting: Certified Nurse Midwife

## 2019-05-03 DIAGNOSIS — Z3A23 23 weeks gestation of pregnancy: Secondary | ICD-10-CM | POA: Diagnosis not present

## 2019-05-03 DIAGNOSIS — O10912 Unspecified pre-existing hypertension complicating pregnancy, second trimester: Secondary | ICD-10-CM | POA: Diagnosis present

## 2019-05-03 DIAGNOSIS — Z79899 Other long term (current) drug therapy: Secondary | ICD-10-CM | POA: Insufficient documentation

## 2019-05-03 DIAGNOSIS — O162 Unspecified maternal hypertension, second trimester: Secondary | ICD-10-CM | POA: Diagnosis present

## 2019-05-03 DIAGNOSIS — Z888 Allergy status to other drugs, medicaments and biological substances status: Secondary | ICD-10-CM | POA: Insufficient documentation

## 2019-05-03 LAB — COMPREHENSIVE METABOLIC PANEL
ALT: 18 U/L (ref 0–44)
AST: 14 U/L — ABNORMAL LOW (ref 15–41)
Albumin: 3 g/dL — ABNORMAL LOW (ref 3.5–5.0)
Alkaline Phosphatase: 120 U/L (ref 38–126)
Anion gap: 9 (ref 5–15)
BUN: 6 mg/dL (ref 6–20)
CO2: 21 mmol/L — ABNORMAL LOW (ref 22–32)
Calcium: 8.8 mg/dL — ABNORMAL LOW (ref 8.9–10.3)
Chloride: 105 mmol/L (ref 98–111)
Creatinine, Ser: 0.47 mg/dL (ref 0.44–1.00)
GFR calc Af Amer: >60 mL/min (ref >60)
GFR calc non Af Amer: >60 mL/min (ref >60)
Glucose, Bld: 108 mg/dL — ABNORMAL HIGH (ref 70–99)
Potassium: 3.6 mmol/L (ref 3.5–5.1)
Sodium: 135 mmol/L (ref 135–145)
Total Bilirubin: 0.2 mg/dL — ABNORMAL LOW (ref 0.3–1.2)
Total Protein: 7.4 g/dL (ref 6.5–8.1)

## 2019-05-03 LAB — CBC
HCT: 37 % (ref 36.0–46.0)
Hemoglobin: 11.5 g/dL — ABNORMAL LOW (ref 12.0–15.0)
MCH: 22.5 pg — ABNORMAL LOW (ref 26.0–34.0)
MCHC: 31.1 g/dL (ref 30.0–36.0)
MCV: 72.4 fL — ABNORMAL LOW (ref 80.0–100.0)
Platelets: 283 10*3/uL (ref 150–400)
RBC: 5.11 MIL/uL (ref 3.87–5.11)
RDW: 17.3 % — ABNORMAL HIGH (ref 11.5–15.5)
WBC: 11.8 10*3/uL — ABNORMAL HIGH (ref 4.0–10.5)
nRBC: 0 % (ref 0.0–0.2)

## 2019-05-03 LAB — FERRITIN: Ferritin: 16 ng/mL (ref 11–307)

## 2019-05-03 LAB — PROTEIN / CREATININE RATIO, URINE
Creatinine, Urine: 152 mg/dL
Protein Creatinine Ratio: 0.11 mg/mg{Cre} (ref 0.00–0.15)
Total Protein, Urine: 16 mg/dL

## 2019-05-03 MED ORDER — LABETALOL HCL 100 MG PO TABS
200.0000 mg | ORAL_TABLET | Freq: Two times a day (BID) | ORAL | Status: DC
  Administered 2019-05-03: 12:00:00 200 mg via ORAL
  Filled 2019-05-03: qty 2

## 2019-05-03 MED ORDER — DIPHENHYDRAMINE HCL 25 MG PO CAPS: 25.0000 mg | ORAL_CAPSULE | Freq: Four times a day (QID) | ORAL | 0 refills | Status: DC | PRN

## 2019-05-03 MED ORDER — ACETAMINOPHEN 500 MG PO TABS
1000.0000 mg | ORAL_TABLET | Freq: Four times a day (QID) | ORAL | Status: DC | PRN
  Administered 2019-05-03: 1000 mg via ORAL
  Filled 2019-05-03: qty 2

## 2019-05-03 MED ORDER — ACETAMINOPHEN 500 MG PO TABS: 1000.0000 mg | ORAL_TABLET | Freq: Four times a day (QID) | ORAL | 0 refills | Status: DC | PRN

## 2019-05-03 MED ORDER — DIPHENHYDRAMINE HCL 25 MG PO CAPS
25.0000 mg | ORAL_CAPSULE | Freq: Four times a day (QID) | ORAL | Status: DC | PRN
  Administered 2019-05-03: 25 mg via ORAL
  Filled 2019-05-03: qty 1

## 2019-05-03 MED ORDER — MENTHOL 3 MG MT LOZG
1.0000 | LOZENGE | OROMUCOSAL | Status: DC | PRN
  Filled 2019-05-03: qty 9

## 2019-05-03 NOTE — Progress Notes (Signed)
Patient discharged to home after serial blood pressures and lab results. She was sent home with instructions of when to return or call for increased blood pressures. Throat culture pending. Patient verbalizes understanding of instructions.

## 2019-05-03 NOTE — OB Triage Note (Signed)
Patient here for elevated blood pressure at urgent care, had history of pre e with her first baby. She also has chronic HTN and is on labetolol she did not take her medication this morning due to not feeling well.

## 2019-05-03 NOTE — Final Progress Note (Signed)
Physician Final Progress Note  Patient ID: Latoya Mora MRN: [id number] DOB/AGE: 17-Dec-1995 23 y.o.  Admit date: 05/03/2019 Admitting provider: Gae Dry, MD/ Jesus Genera. Danise Mina, [place] Discharge date: 05/03/2019   Admission Diagnoses: IUP at 23wk5d Elevated blood pressure in pregnancy Chronic hypertension  Discharge Diagnoses:  Active Problems:   Elevated blood pressure complicating pregnancy in second trimester, antepartum  Chronic hypertension Seasonal allergies Possible URI  Consults: None  Significant Findings/ Diagnostic Studies:    HPI:  Latoya Mora is a 23 y.o. G37P1001 female at [redacted]w[redacted]d with an EDC=08/25/2019  Her pregnancy has been complicated by anxiety/depression, obesity, chronic hypertension, a history of preeclampsia with her first baby,  a dermoid cyst on her left ovary and anemia..  She presents to L&D for evaluation of her elevated blood pressure.  She went to an Urgent Care in [place] for complaints of nasal congestion, burning and itching of top of mouth, post nasal drainage and mild sore throat. Her blood pressure was 170/100 and she was advised to go to the nearest hospital for an evaluation. Normally she takes 200 mgm labetalol BID, but she forgot to take her morning dose before she rushed off to Urgent Care.  She denies visual changes, nausea, vomiting, chest pain. Has had some mild frontal headache she feels is from the sinus congestion.  Baby active. No vaginal bleeding.     Prenatal care site: Prenatal care at [facility] has also  been remarkable for  Nursing Staff Provider  Office Location  [place] Dating  LMP  Language   English Anatomy US  WNL  Flu Vaccine   02-24-19 Genetic Screen  NIPS: WNL  AFP:      TDaP vaccine    Hgb A1C or  GTT Early 5.2 Third trimester: normal  Rhogam   Pill  Vs PP IUD   LAB RESULTS   Feeding Plan  breast Blood Type B/Positive/-- (08/17 1408)   Contraception  ? Antibody Negative (08/17 1408)  Circumcision   Rubella 1.01 (08/17 1408)  Pediatrician   [facility] RPR Non Reactive (08/17 1408)   Support Person  husband HBsAg Negative (08/17 1408)   Prenatal Classes  online prn HIV Non Reactive (08/17 1408)  BTL Consent  n/a GBS Negative (08/25 0000)(For PCN allergy, check sensitivities)   VBAC Consent  n/a Pap 10/2017 WNL    Hgb Electro    BP Cuff  yes CF Neg    SMA     Waterbirth  [ ]  Class [ ]  Consent [ ]  CNM visit         Maternal Medical History:   Past Medical History:  Diagnosis Date  . Allergy   . Anxiety   . Depression   . Dermoid cyst of left ovary 11/04/2017  . Elevated liver enzymes   . Frequent headaches   . History of pre-eclampsia 06/26/2018  . Hypertension   . PCOS (polycystic ovarian syndrome)   . Pre-eclampsia 05/19/2018    Past Surgical History:  Procedure Laterality Date  . TOOTH EXTRACTION      Allergies  Allergen Reactions  . Etodolac Nausea Only  . Meloxicam Other (See Comments)    Abdominal pain  . Flexeril [Cyclobenzaprine] Nausea Only  . Nsaids Other (See Comments)    Stomach pain  . Prozac [Fluoxetine Hcl] Other (See Comments)    Felt bad/awful    Prior to Admission medications   Medication Sig Start Date End Date Taking? Authorizing Provider  aspirin EC 81 MG tablet Take 2 tablets (162 mg total) by mouth daily. 02/09/19   Donnamae Jude, MD         ferrous sulfate (FERROUSUL) 325 (65 FE) MG tablet Take 1 tablet (325 mg total) by mouth daily. 02/11/19   Donnamae Jude, MD         labetalol (NORMODYNE) 200 MG tablet Take 1 tablet (200 mg total) by mouth 2 (two) times daily. 01/12/19   Anyanwu, Sallyanne Havers, MD  Prenatal Vit-Fe Fumarate-FA (PRENATAL MULTIVITAMIN) TABS tablet Take 1 tablet by mouth daily at 12 noon.    [provider]                 Social History: She  reports that she has never smoked. She has never used smokeless tobacco. She reports previous alcohol use. She reports that she does not use  drugs.  Family History: family history includes Alcohol abuse in her father; Anxiety disorder in her mother; Arthritis in her paternal grandmother; Bipolar disorder in her father and paternal grandfather; COPD in her maternal grandfather and maternal grandmother; Depression in her father and mother; Heart disease in her mother; Osteochondroma in her brother, father, and sister; Scoliosis in her maternal grandmother and mother; Skin cancer in her maternal grandfather and mother.   Review of Systems: Negative x 10 systems reviewed except as noted in the HPI.      Physical Exam:  Vital Signs: 143/83, 145/78, 127/77, 138/79, 129/80, 144/69, 149/85 98.7-116-20 General: gravid WF in no acute distress.  HEENT: normocephalic, atraumatic  Mouth: moist mucous membranes  OP: no inflammation or exudates, enlarged tonsils Neck: no cervical lymphadenopathy Heart: Tachycardia, regular rhythm.  No murmurs/rubs/gallops Lungs: clear to auscultation bilaterally Abdomen: soft, gravid, non-tender  Extremities: non-tender, symmetric, no edema bilaterally.   Neurologic: Alert & oriented x 3.   Baseline FHR: 150  Protein / creatinine ratio, urine     Status: None   Collection Time: 05/03/19 11:52 AM  Result Value Ref Range   Creatinine, Urine 152 mg/dL   Total Protein, Urine 16 mg/dL    Comment: NO NORMAL RANGE ESTABLISHED FOR THIS TEST   Protein Creatinine Ratio 0.11 0.00 - 0.15 mg/mg[Cre]    Comment: Performed at [facility], [address] [place], [place] [address]  Culture, group A strep     Status: None   Collection Time: 05/03/19 12:20 PM   Specimen: Throat  Result Value Ref Range   Specimen Description      THROAT Performed at [facility], [address] [place], [place] [address]    Special Requests      NONE Performed at [facility], [place], [place] [address]    Culture      NO GROUP A STREP (S.PYOGENES) ISOLATED Performed at [facility], [place] [address], [place], [place] [address]    Report Status 05/05/2019 FINAL   Ferritin     Status: None   Collection Time: 05/03/19 12:46 PM  Result Value Ref Range   Ferritin 16 11 - 307 ng/mL    Comment: Performed at [facility], [address] [place], [place] [address]  CBC     Status: Abnormal   Collection Time: 05/03/19 12:46 PM  Result Value Ref Range   WBC 11.8 (H) 4.0 - 10.5 K/uL   RBC 5.11 3.87 - 5.11 MIL/uL   Hemoglobin 11.5 (L) 12.0 - 15.0 g/dL   HCT 37.0 36.0 -  46.0 %   MCV 72.4 (L) 80.0 - 100.0 fL   MCH 22.5 (L) 26.0 - 34.0 pg   MCHC 31.1 30.0 - 36.0 g/dL   RDW 17.3 (H) 11.5 - 15.5 %   Platelets 283 150 - 400 K/uL   nRBC 0.0 0.0 - 0.2 %    Comment: Performed at [facility], [address] [place], [place] [address]  Comprehensive metabolic panel     Status: Abnormal   Collection Time: 05/03/19 12:46 PM  Result Value Ref Range   Sodium 135 135 - 145 mmol/L   Potassium 3.6 3.5 - 5.1 mmol/L   Chloride 105 98 - 111 mmol/L   CO2 21 (L) 22 - 32 mmol/L   Glucose, Bld 108 (H) 70 - 99 mg/dL   BUN 6 6 - 20 mg/dL   Creatinine, Ser 0.47 0.44 - 1.00 mg/dL   Calcium 8.8 (L) 8.9 - 10.3 mg/dL   Total Protein 7.4 6.5 - 8.1 g/dL   Albumin 3.0 (L) 3.5 - 5.0 g/dL   AST 14 (L) 15 - 41 U/L   ALT 18 0 - 44 U/L   Alkaline Phosphatase 120 38 - 126 U/L   Total Bilirubin 0.2 (L) 0.3 - 1.2 mg/dL   GFR calc non Af Amer >60 >60 mL/min   GFR calc Af Amer >60 >60 mL/min   Anion gap 9 5 - 15    Comment: Performed at [facility], [address], [place], [place] [address]   Assessment:  Latoya Mora is a 23 y.o. G51P1001 female at 23wk 5days CHTN-with elevated blood pressures in the mild range after forgetting to take labetalol this AM  No evidence of preeclampsia Seasonal allergies   Plan: Discharge home. Continue labetalol 200 mgm BID Tylenol, Benadryl, Flonase for seasonal allergies  Avoid decongestants as they may increase blood  pressure Will call with throat culture results [facility]  Procedures: none  Discharge Condition: stable  Disposition: Discharge disposition: 01-Home or Self Care       Diet: Regular diet  Discharge Activity: Activity as tolerated   Allergies as of 05/03/2019      Reactions   Etodolac Nausea Only   Meloxicam Other (See Comments)   Abdominal pain   Flexeril [cyclobenzaprine] Nausea Only   Nsaids Other (See Comments)   Stomach pain   Prozac [fluoxetine Hcl] Other (See Comments)   Felt bad/awful      Medication List    STOP taking these medications   Doxylamine-Pyridoxine 10-10 MG Tbec Commonly known as: Diclegis     TAKE these medications   acetaminophen 500 MG tablet Commonly known as: TYLENOL Take 2 tablets (1,000 mg total) by mouth every 6 (six) hours as needed for fever or headache.   aspirin EC 81 MG tablet Take 2 tablets (162 mg total) by mouth daily.   diphenhydrAMINE 25 mg capsule Commonly known as: BENADRYL Take 1 capsule (25 mg total) by mouth every 6 (six) hours as needed for allergies.   ferrous sulfate 325 (65 FE) MG tablet Commonly known as: FerrouSul Take 1 tablet (325 mg total) by mouth daily.   labetalol 200 MG tablet Commonly known as: NORMODYNE Take 1 tablet (200 mg total) by mouth 2 (two) times daily.   prenatal multivitamin Tabs tablet Take 1 tablet by mouth daily at 12 noon.        Total time spent taking care of this patient: 20 minutes  Signed: Dalia Heading 05/03/2019, 2:23  PM

## 2019-05-04 ENCOUNTER — Telehealth: Payer: BC Managed Care – PPO | Admitting: Physician Assistant

## 2019-05-04 DIAGNOSIS — J309 Allergic rhinitis, unspecified: Secondary | ICD-10-CM | POA: Diagnosis not present

## 2019-05-04 DIAGNOSIS — R05 Cough: Secondary | ICD-10-CM

## 2019-05-04 DIAGNOSIS — R059 Cough, unspecified: Secondary | ICD-10-CM

## 2019-05-04 MED ORDER — FLUTICASONE PROPIONATE 50 MCG/ACT NA SUSP: 2.0000 | Freq: Every day | NASAL | 6 refills | Status: DC

## 2019-05-04 NOTE — Progress Notes (Signed)
E visit for Allergic Rhinitis We are sorry that you are not feeling well.  Here is how we plan to help!  Based on what you have shared with me it looks like you have Allergic Rhinitis.  Rhinitis is when a reaction occurs that causes nasal congestion, runny nose, sneezing, and itching.  Most types of rhinitis are caused by an inflammation and are associated with symptoms in the eyes ears or throat. There are several types of rhinitis.  The most common are acute rhinitis, which is usually caused by a viral illness, allergic or seasonal rhinitis, and nonallergic or year-round rhinitis.  Nasal allergies occur certain times of the year.  Allergic rhinitis is caused when allergens in the air trigger the release of histamine in the body.  Histamine causes itching, swelling, and fluid to build up in the fragile linings of the nasal passages, sinuses and eyelids.  An itchy nose and clear discharge are common.  I also would recommend a nasal spray: Flonase 2 sprays into each nostril once daily and Saline 1 spray into each nostril as needed  You may also benefit from eye drops such as: Systane 1-2 driops each eye twice daily as needed  HOME CARE:   You can use an over-the-counter saline nasal spray as needed  Avoid areas where there is heavy dust, mites, or molds  Stay indoors on windy days during the pollen season  Keep windows closed in home, at least in bedroom; use air conditioner.  Use high-efficiency house air filter  Keep windows closed in car, turn AC on re-circulate  Avoid playing out with dog during pollen season  GET HELP RIGHT AWAY IF:   If your symptoms do not improve within 10 days  You become short of breath  You develop yellow or green discharge from your nose for over 3 days  You have coughing fits  MAKE SURE YOU:   Understand these instructions  Will watch your condition  Will get help right away if you are not doing well or get worse  Thank you for choosing an  e-visit. Your e-visit answers were reviewed by a board certified advanced clinical practitioner to complete your personal care plan. Depending upon the condition, your plan could have included both over the counter or prescription medications. Please review your pharmacy choice. Be sure that the pharmacy you have chosen is open so that you can pick up your prescription now.  If there is a problem you may message your provider in [place] to have the prescription routed to another pharmacy. Your safety is important to [place]. If you have drug allergies check your prescription carefully.  For the next 24 hours, you can use MyChart to ask questions about today's visit, request a non-urgent call back, or ask for a work or school excuse from your e-visit provider. You will get an email in the next two days asking about your experience. I hope that your e-visit has been valuable and will speed your recovery.    Greater than 5 minutes, yet less than 10 minutes of time have been spent researching, coordinating and implementing care for this patient today.

## 2019-05-05 ENCOUNTER — Other Ambulatory Visit: Payer: Self-pay

## 2019-05-05 ENCOUNTER — Ambulatory Visit: Payer: Medicaid Other | Admitting: Clinical

## 2019-05-05 DIAGNOSIS — Z5329 Procedure and treatment not carried out because of patient's decision for other reasons: Secondary | ICD-10-CM

## 2019-05-05 DIAGNOSIS — Z91199 Patient's noncompliance with other medical treatment and regimen due to unspecified reason: Secondary | ICD-10-CM

## 2019-05-05 LAB — CULTURE, GROUP A STREP (THRC)

## 2019-05-05 NOTE — BH Specialist Note (Signed)
Pt did not arrive to video visit and did not answer the phone; Left HIPPA-compliant message to call back Roselyn Reef from [facility] at [phone], and left MyChart message for patient.   [place] via Telemedicine Video Visit  05/05/2019 Sadiya Cuttino [id number]  Garlan Fair

## 2019-05-19 ENCOUNTER — Encounter: Payer: BC Managed Care – PPO | Admitting: Obstetrics and Gynecology

## 2019-05-23 ENCOUNTER — Encounter (HOSPITAL_COMMUNITY): Payer: Self-pay

## 2019-05-23 ENCOUNTER — Other Ambulatory Visit: Payer: Self-pay

## 2019-05-23 ENCOUNTER — Inpatient Hospital Stay (HOSPITAL_COMMUNITY)
Admission: AD | Admit: 2019-05-23 | Discharge: 2019-05-23 | Disposition: A | Discharge status: Home / Self Care | Payer: BC Managed Care – PPO | Source: Ambulatory Visit | Attending: Obstetrics and Gynecology | Admitting: Obstetrics and Gynecology

## 2019-05-23 DIAGNOSIS — R109 Unspecified abdominal pain: Secondary | ICD-10-CM | POA: Diagnosis not present

## 2019-05-23 DIAGNOSIS — Z3A26 26 weeks gestation of pregnancy: Secondary | ICD-10-CM

## 2019-05-23 DIAGNOSIS — O99282 Endocrine, nutritional and metabolic diseases complicating pregnancy, second trimester: Secondary | ICD-10-CM | POA: Insufficient documentation

## 2019-05-23 DIAGNOSIS — Z7982 Long term (current) use of aspirin: Secondary | ICD-10-CM | POA: Insufficient documentation

## 2019-05-23 DIAGNOSIS — O162 Unspecified maternal hypertension, second trimester: Secondary | ICD-10-CM | POA: Insufficient documentation

## 2019-05-23 DIAGNOSIS — O26892 Other specified pregnancy related conditions, second trimester: Secondary | ICD-10-CM | POA: Diagnosis present

## 2019-05-23 DIAGNOSIS — M549 Dorsalgia, unspecified: Secondary | ICD-10-CM | POA: Diagnosis not present

## 2019-05-23 DIAGNOSIS — Z79899 Other long term (current) drug therapy: Secondary | ICD-10-CM | POA: Insufficient documentation

## 2019-05-23 DIAGNOSIS — E282 Polycystic ovarian syndrome: Secondary | ICD-10-CM | POA: Diagnosis not present

## 2019-05-23 DIAGNOSIS — R1084 Generalized abdominal pain: Secondary | ICD-10-CM | POA: Insufficient documentation

## 2019-05-23 DIAGNOSIS — R110 Nausea: Secondary | ICD-10-CM | POA: Insufficient documentation

## 2019-05-23 LAB — COMPREHENSIVE METABOLIC PANEL
ALT: 32 U/L (ref 0–44)
AST: 23 U/L (ref 15–41)
Albumin: 2.5 g/dL — ABNORMAL LOW (ref 3.5–5.0)
Alkaline Phosphatase: 145 U/L — ABNORMAL HIGH (ref 38–126)
Anion gap: 9 (ref 5–15)
BUN: 5 mg/dL — ABNORMAL LOW (ref 6–20)
CO2: 21 mmol/L — ABNORMAL LOW (ref 22–32)
Calcium: 8.8 mg/dL — ABNORMAL LOW (ref 8.9–10.3)
Chloride: 106 mmol/L (ref 98–111)
Creatinine, Ser: 0.61 mg/dL (ref 0.44–1.00)
GFR calc Af Amer: >60 mL/min (ref >60)
GFR calc non Af Amer: >60 mL/min (ref >60)
Glucose, Bld: 90 mg/dL (ref 70–99)
Potassium: 3.7 mmol/L (ref 3.5–5.1)
Sodium: 136 mmol/L (ref 135–145)
Total Bilirubin: 0.3 mg/dL (ref 0.3–1.2)
Total Protein: 6.6 g/dL (ref 6.5–8.1)

## 2019-05-23 LAB — CBC WITH DIFFERENTIAL/PLATELET
Abs Immature Granulocytes: 0.05 10*3/uL (ref 0.00–0.07)
Basophils Absolute: 0 10*3/uL (ref 0.0–0.1)
Basophils Relative: 0 %
Eosinophils Absolute: 0.1 10*3/uL (ref 0.0–0.5)
Eosinophils Relative: 1 %
HCT: 36.1 % (ref 36.0–46.0)
Hemoglobin: 11.2 g/dL — ABNORMAL LOW (ref 12.0–15.0)
Immature Granulocytes: 0 %
Lymphocytes Relative: 20 %
Lymphs Abs: 2.3 10*3/uL (ref 0.7–4.0)
MCH: 23 pg — ABNORMAL LOW (ref 26.0–34.0)
MCHC: 31 g/dL (ref 30.0–36.0)
MCV: 74.1 fL — ABNORMAL LOW (ref 80.0–100.0)
Monocytes Absolute: 0.7 10*3/uL (ref 0.1–1.0)
Monocytes Relative: 6 %
Neutro Abs: 8.4 10*3/uL — ABNORMAL HIGH (ref 1.7–7.7)
Neutrophils Relative %: 73 %
Platelets: 279 10*3/uL (ref 150–400)
RBC: 4.87 MIL/uL (ref 3.87–5.11)
RDW: 17 % — ABNORMAL HIGH (ref 11.5–15.5)
WBC: 11.5 10*3/uL — ABNORMAL HIGH (ref 4.0–10.5)
nRBC: 0 % (ref 0.0–0.2)

## 2019-05-23 LAB — URINALYSIS, ROUTINE W REFLEX MICROSCOPIC
Bilirubin Urine: NEGATIVE
Glucose, UA: NEGATIVE mg/dL
Hgb urine dipstick: NEGATIVE
Ketones, ur: NEGATIVE mg/dL
Leukocytes,Ua: NEGATIVE
Nitrite: NEGATIVE
Protein, ur: NEGATIVE mg/dL
Specific Gravity, Urine: 1.014 (ref 1.005–1.030)
pH: 6 (ref 5.0–8.0)

## 2019-05-23 LAB — LIPASE, BLOOD: Lipase: 23 U/L (ref 11–51)

## 2019-05-23 LAB — AMYLASE: Amylase: 35 U/L (ref 28–100)

## 2019-05-23 MED ORDER — ACETAMINOPHEN 500 MG PO TABS
1000.0000 mg | ORAL_TABLET | Freq: Once | ORAL | Status: AC
  Administered 2019-05-23: 1000 mg via ORAL
  Filled 2019-05-23: qty 2

## 2019-05-23 NOTE — Discharge Instructions (Signed)

## 2019-05-23 NOTE — MAU Note (Signed)
Pt states she started having some back pain last night that was so bad it made her nauseated. Today she said her stomach hurts "pretty much everywhere below my ribs". Stated she has had some braxton hicks contractions some painful but are not regular. Denies LOF/VB. States she is on medication for high blood pressure but has not taken it today. Rating her abdominal pain 3/10, describing it as a "very intense discomfort". Reports good fetal movement.

## 2019-05-23 NOTE — MAU Provider Note (Signed)
Chief Complaint:  Abdominal Pain and Back Pain   First Provider Initiated Contact with Patient 05/23/19 1806     HPI: Latoya Mora is a 23 y.o. G2P1001 at [redacted]w[redacted]d who presents to maternity admissions reporting abdominal pain. Woke up in the middle of the night last night with pain between her shoulder blades. Since then has had generalized abdominal pain & back pain has decreased. Abdominal pain is throughout her abdomen but worse under her ribs. Describes as aching pain. Nothing makes better or worse. Took 1 BASA this morning without relief. Has had 4 episode of nausea today but no vomiting. Had 1 watery stool earlier today. Has been eating Thanksgiving type food for the last 2 days.  Denies dysuria, fever/chills, LOF, or vaginal bleeding. Good fetal movement.  Location: abdomen Quality: aching Severity: 3/10 in pain scale Duration: 1 day Timing: constant Modifying factors: none Associated signs and symptoms: none  Pregnancy Course: [facility], hx of preeclampsia, CHTN  Past Medical History:  Diagnosis Date  . Allergy   . Anxiety   . Depression   . Dermoid cyst of left ovary 11/04/2017  . Elevated liver enzymes   . Frequent headaches   . History of pre-eclampsia 06/26/2018  . Hypertension   . PCOS (polycystic ovarian syndrome)    OB History  Gravida Para Term Preterm AB Living  2 1 1  0 0 1  SAB TAB Ectopic Multiple Live Births  0 0 0 0 1    # Outcome Date GA Lbr Len/2nd Weight Sex Delivery Anes PTL Lv  2 Current           1 Term 05/28/18 [redacted]w[redacted]d 02:00 / 00:13 3368 g M Vag-Spont EPI  LIV   Past Surgical History:  Procedure Laterality Date  . TOOTH EXTRACTION     Family History  Problem Relation Age of Onset  . Depression Mother   . Anxiety disorder Mother   . Heart disease Mother   . Skin cancer Mother   . Scoliosis Mother   . Alcohol abuse Father   . Bipolar disorder Father   . Depression Father   . Osteochondroma Father   . COPD Maternal Grandmother   . Scoliosis  Maternal Grandmother   . COPD Maternal Grandfather   . Skin cancer Maternal Grandfather   . Arthritis Paternal Grandmother   . Bipolar disorder Paternal Grandfather   . Osteochondroma Sister   . Osteochondroma Brother   . Cancer Neg Hx    Social History   Tobacco Use  . Smoking status: Never Smoker  . Smokeless tobacco: Never Used  Substance Use Topics  . Alcohol use: Not Currently  . Drug use: No   Allergies  Allergen Reactions  . Etodolac Nausea Only  . Meloxicam Other (See Comments)    Abdominal pain  . Flexeril [Cyclobenzaprine] Nausea Only  . Nsaids Other (See Comments)    Stomach pain  . Prozac [Fluoxetine Hcl] Other (See Comments)    Felt bad/awful   Medications Prior to Admission  Medication Sig Dispense Refill Last Dose  . acetaminophen (TYLENOL) 500 MG tablet Take 2 tablets (1,000 mg total) by mouth every 6 (six) hours as needed for fever or headache. 30 tablet 0   . aspirin EC 81 MG tablet Take 2 tablets (162 mg total) by mouth daily. 180 tablet 3   . diphenhydrAMINE (BENADRYL) 25 mg capsule Take 1 capsule (25 mg total) by mouth every 6 (six) hours as needed for allergies. 30 capsule 0   .  ferrous sulfate (FERROUSUL) 325 (65 FE) MG tablet Take 1 tablet (325 mg total) by mouth daily. 60 tablet 1   . fluticasone (FLONASE) 50 MCG/ACT nasal spray Place 2 sprays into both nostrils daily. 16 g 6   . labetalol (NORMODYNE) 200 MG tablet Take 1 tablet (200 mg total) by mouth 2 (two) times daily. 60 tablet 3   . Prenatal Vit-Fe Fumarate-FA (PRENATAL MULTIVITAMIN) TABS tablet Take 1 tablet by mouth daily at 12 noon.       I have reviewed patient's Past Medical Hx, Surgical Hx, Family Hx, Social Hx, medications and allergies.   ROS:  Review of Systems  Constitutional: Negative.   Gastrointestinal: Positive for abdominal pain, diarrhea and nausea. Negative for constipation and vomiting.  Genitourinary: Negative.     Physical Exam   Patient Vitals for the past 24  hrs:  BP Pulse Resp SpO2 Height Weight  05/23/19 1821 129/75 (!) 119 - 98 % - -  05/23/19 1731 135/83 (!) 127 17 - - -  05/23/19 1728 - - - - 5\' 8"  (1.727 m) (!) 151 kg    Constitutional: Well-developed, well-nourished female in no acute distress.  Cardiovascular: normal rate & rhythm, no murmur Respiratory: normal effort, lung sounds clear throughout GI: Abd soft, non-tender, gravid appropriate for gestational age. Pos BS x 4. Negative murphy's sign MS: Extremities nontender, no edema, normal ROM Neurologic: Alert and oriented x 4.  GU:    Dilation: Closed Effacement (%): Thick Cervical Position: Posterior Station: -3 Exam by:: Jorje Guild NP  NST:  Baseline: 150 bpm, Variability: Good {> 6 bpm), Accelerations: Reactive and Decelerations: Absent   Labs: Results for orders placed or performed during the hospital encounter of 05/23/19 (from the past 24 hour(s))  Urinalysis, Routine w reflex microscopic     Status: Abnormal   Collection Time: 05/23/19  5:53 PM  Result Value Ref Range   Color, Urine YELLOW YELLOW   APPearance HAZY (A) CLEAR   Specific Gravity, Urine 1.014 1.005 - 1.030   pH 6.0 5.0 - 8.0   Glucose, UA NEGATIVE NEGATIVE mg/dL   Hgb urine dipstick NEGATIVE NEGATIVE   Bilirubin Urine NEGATIVE NEGATIVE   Ketones, ur NEGATIVE NEGATIVE mg/dL   Protein, ur NEGATIVE NEGATIVE mg/dL   Nitrite NEGATIVE NEGATIVE   Leukocytes,Ua NEGATIVE NEGATIVE  CBC with Differential     Status: Abnormal   Collection Time: 05/23/19  6:34 PM  Result Value Ref Range   WBC 11.5 (H) 4.0 - 10.5 K/uL   RBC 4.87 3.87 - 5.11 MIL/uL   Hemoglobin 11.2 (L) 12.0 - 15.0 g/dL   HCT 36.1 36.0 - 46.0 %   MCV 74.1 (L) 80.0 - 100.0 fL   MCH 23.0 (L) 26.0 - 34.0 pg   MCHC 31.0 30.0 - 36.0 g/dL   RDW 17.0 (H) 11.5 - 15.5 %   Platelets 279 150 - 400 K/uL   nRBC 0.0 0.0 - 0.2 %   Neutrophils Relative % 73 %   Neutro Abs 8.4 (H) 1.7 - 7.7 K/uL   Lymphocytes Relative 20 %   Lymphs Abs 2.3 0.7 - 4.0  K/uL   Monocytes Relative 6 %   Monocytes Absolute 0.7 0.1 - 1.0 K/uL   Eosinophils Relative 1 %   Eosinophils Absolute 0.1 0.0 - 0.5 K/uL   Basophils Relative 0 %   Basophils Absolute 0.0 0.0 - 0.1 K/uL   Immature Granulocytes 0 %   Abs Immature Granulocytes 0.05 0.00 - 0.07 K/uL  Comprehensive metabolic panel     Status: Abnormal   Collection Time: 05/23/19  6:34 PM  Result Value Ref Range   Sodium 136 135 - 145 mmol/L   Potassium 3.7 3.5 - 5.1 mmol/L   Chloride 106 98 - 111 mmol/L   CO2 21 (L) 22 - 32 mmol/L   Glucose, Bld 90 70 - 99 mg/dL   BUN 5 (L) 6 - 20 mg/dL   Creatinine, Ser 0.61 0.44 - 1.00 mg/dL   Calcium 8.8 (L) 8.9 - 10.3 mg/dL   Total Protein 6.6 6.5 - 8.1 g/dL   Albumin 2.5 (L) 3.5 - 5.0 g/dL   AST 23 15 - 41 U/L   ALT 32 0 - 44 U/L   Alkaline Phosphatase 145 (H) 38 - 126 U/L   Total Bilirubin 0.3 0.3 - 1.2 mg/dL   GFR calc non Af Amer >60 >60 mL/min   GFR calc Af Amer >60 >60 mL/min   Anion gap 9 5 - 15  Lipase, blood     Status: None   Collection Time: 05/23/19  6:34 PM  Result Value Ref Range   Lipase 23 11 - 51 U/L  Amylase     Status: None   Collection Time: 05/23/19  6:34 PM  Result Value Ref Range   Amylase 35 28 - 100 U/L    Imaging:  No results found.  MAU Course: Orders Placed This Encounter  Procedures  . Urinalysis, Routine w reflex microscopic  . CBC with Differential  . Comprehensive metabolic panel  . Lipase, blood  . Amylase  . Discharge patient   Meds ordered this encounter  Medications  . acetaminophen (TYLENOL) tablet 1,000 mg    MDM: Reactive fetal tracing. No contractions. Cervix closed/thick Benign abdominal exam Tylenol 1 gm PO CBC, CMP, amylase, & lipase ordered Pt with CHTN - normotensive in MAU  Labs pregnant normal Pt reports improvement in pain after tylenol  Assessment: 1. Abdominal pain during pregnancy in second trimester   2. [redacted] weeks gestation of pregnancy     Plan: Discharge home in stable  condition.  Preterm Labor precautions and fetal kick counts Discussed reasons to return to MAU  Follow-up Information    [facility] Follow up.   Specialty: Obstetrics and Gynecology Why: return for worsening symptoms Contact information: [address] [id number] [place] [phone]          Allergies as of 05/23/2019      Reactions   Etodolac Nausea Only   Meloxicam Other (See Comments)   Abdominal pain   Flexeril [cyclobenzaprine] Nausea Only   Nsaids Other (See Comments)   Stomach pain   Prozac [fluoxetine Hcl] Other (See Comments)   Felt bad/awful      Medication List    TAKE these medications   acetaminophen 500 MG tablet Commonly known as: TYLENOL Take 2 tablets (1,000 mg total) by mouth every 6 (six) hours as needed for fever or headache.   aspirin EC 81 MG tablet Take 2 tablets (162 mg total) by mouth daily.   diphenhydrAMINE 25 mg capsule Commonly known as: BENADRYL Take 1 capsule (25 mg total) by mouth every 6 (six) hours as needed for allergies.   ferrous sulfate 325 (65 FE) MG tablet Commonly known as: FerrouSul Take 1 tablet (325 mg total) by mouth daily.   fluticasone 50 MCG/ACT nasal spray Commonly known as: FLONASE Place 2 sprays into both nostrils daily.   labetalol 200  MG tablet Commonly known as: NORMODYNE Take 1 tablet (200 mg total) by mouth 2 (two) times daily.   prenatal multivitamin Tabs tablet Take 1 tablet by mouth daily at 12 noon.       Jorje Guild, NP 05/23/2019 7:22 PM

## 2019-06-01 ENCOUNTER — Ambulatory Visit (INDEPENDENT_AMBULATORY_CARE_PROVIDER_SITE_OTHER): Payer: BC Managed Care – PPO | Admitting: Obstetrics & Gynecology

## 2019-06-01 ENCOUNTER — Other Ambulatory Visit: Payer: Self-pay

## 2019-06-01 VITALS — BP 119/78 | HR 72 | Wt 332.0 lb

## 2019-06-01 DIAGNOSIS — O99012 Anemia complicating pregnancy, second trimester: Secondary | ICD-10-CM

## 2019-06-01 DIAGNOSIS — N393 Stress incontinence (female) (male): Secondary | ICD-10-CM

## 2019-06-01 DIAGNOSIS — O0992 Supervision of high risk pregnancy, unspecified, second trimester: Secondary | ICD-10-CM

## 2019-06-01 DIAGNOSIS — Z23 Encounter for immunization: Secondary | ICD-10-CM | POA: Diagnosis not present

## 2019-06-01 DIAGNOSIS — O99891 Other specified diseases and conditions complicating pregnancy: Secondary | ICD-10-CM

## 2019-06-01 DIAGNOSIS — Z3A27 27 weeks gestation of pregnancy: Secondary | ICD-10-CM

## 2019-06-01 DIAGNOSIS — O99019 Anemia complicating pregnancy, unspecified trimester: Secondary | ICD-10-CM

## 2019-06-01 DIAGNOSIS — O10912 Unspecified pre-existing hypertension complicating pregnancy, second trimester: Secondary | ICD-10-CM

## 2019-06-01 DIAGNOSIS — O0990 Supervision of high risk pregnancy, unspecified, unspecified trimester: Secondary | ICD-10-CM

## 2019-06-01 DIAGNOSIS — O10919 Unspecified pre-existing hypertension complicating pregnancy, unspecified trimester: Secondary | ICD-10-CM

## 2019-06-01 NOTE — Progress Notes (Signed)
-  tdap today

## 2019-06-01 NOTE — Progress Notes (Signed)
   PRENATAL VISIT NOTE  Subjective:  Latoya Mora is a 23 y.o. G2P1001 at [redacted]w[redacted]d being seen today for ongoing prenatal care.  She is currently monitored for the following issues for this high-risk pregnancy and has Seasonal allergies; Anxiety and depression; Frequent headaches; Obesity, Class III, BMI 40-49.9 (morbid obesity) ([place]); PCOS (polycystic ovarian syndrome); Supervision of high risk pregnancy, antepartum; Dermoid cyst of left ovary; Chronic hypertension complicating pregnancy, antepartum; Anemia in pregnancy; Short interval between pregnancies affecting pregnancy, antepartum; Stress incontinence during pregnancy; and Elevated blood pressure complicating pregnancy in second trimester, antepartum on their problem list.  Patient reocc BH ctx sometimes 4 at a time then they resolve. Contractions: Not present.  .  Movement: Present. Denies leaking of fluid. NO VB.   The following portions of the patient's history were reviewed and updated as appropriate: allergies, current medications, past family history, past medical history, past social history, past surgical history and problem list.   Objective:   Vitals:   06/01/19 1400  BP: 119/78  Pulse: 72  Weight: (!) 332 lb (150.6 kg)    Fetal Status: Fetal Heart Rate (bpm): 144   Movement: Present     General:  Alert, oriented and cooperative. Patient is in no acute distress.  Skin: Skin is warm and dry. No rash noted.   Cardiovascular: Normal heart rate noted  Respiratory: Normal respiratory effort, no problems with respiration noted  Abdomen: Soft, gravid, appropriate for gestational age.  Pain/Pressure: Present     Pelvic: Cervical exam deferred        Extremities: Normal range of motion.  Edema: None  Mental Status: Normal mood and affect. Normal behavior. Normal judgment and thought content.   Assessment and Plan:  Pregnancy: G2P1001 at [redacted]w[redacted]d 1. Supervision of high risk pregnancy, antepartum Good FM S>D has appt in am to check  size.   2. Stress incontinence during pregnancy Unchanged.   3. Chronic hypertension complicating pregnancy, antepartum BP looks good on Normodyne Baby ASA daily  4. Antepartum anemia On Ferrous sulfate. Reviewed how to take.   Preterm labor symptoms and general obstetric precautions including but not limited to vaginal bleeding, contractions, leaking of fluid and fetal movement were reviewed in detail with the patient. Please refer to After Visit Summary for other counseling recommendations.   No follow-ups on file.  Future Appointments  Date Time Provider [place]  06/02/2019  8:45 AM [place] [facility]  06/02/2019  8:45 AM [place] [facility]    Lavonia Drafts, MD

## 2019-06-01 NOTE — Patient Instructions (Signed)
Third Trimester of Pregnancy The third trimester is from week 28 through week 40 (months 7 through 9). The third trimester is a time when the unborn baby (fetus) is growing rapidly. At the end of the ninth month, the fetus is about 20 inches in length and weighs 6-10 pounds. Body changes during your third trimester Your body will continue to go through many changes during pregnancy. The changes vary from woman to woman. During the third trimester:  Your weight will continue to increase. You can expect to gain 25-35 pounds (11-16 kg) by the end of the pregnancy.  You may begin to get stretch marks on your hips, abdomen, and breasts.  You may urinate more often because the fetus is moving lower into your pelvis and pressing on your bladder.  You may develop or continue to have heartburn. This is caused by increased hormones that slow down muscles in the digestive tract.  You may develop or continue to have constipation because increased hormones slow digestion and cause the muscles that push waste through your intestines to relax.  You may develop hemorrhoids. These are swollen veins (varicose veins) in the rectum that can itch or be painful.  You may develop swollen, bulging veins (varicose veins) in your legs.  You may have increased body aches in the pelvis, back, or thighs. This is due to weight gain and increased hormones that are relaxing your joints.  You may have changes in your hair. These can include thickening of your hair, rapid growth, and changes in texture. Some women also have hair loss during or after pregnancy, or hair that feels dry or thin. Your hair will most likely return to normal after your baby is born.  Your breasts will continue to grow and they will continue to become tender. A yellow fluid (colostrum) may leak from your breasts. This is the first milk you are producing for your baby.  Your belly button may stick out.  You may notice more swelling in your hands,  face, or ankles.  You may have increased tingling or numbness in your hands, arms, and legs. The skin on your belly may also feel numb.  You may feel short of breath because of your expanding uterus.  You may have more problems sleeping. This can be caused by the size of your belly, increased need to urinate, and an increase in your body's metabolism.  You may notice the fetus "dropping," or moving lower in your abdomen (lightening).  You may have increased vaginal discharge.  You may notice your joints feel loose and you may have pain around your pelvic bone. What to expect at prenatal visits You will have prenatal exams every 2 weeks until week 36. Then you will have weekly prenatal exams. During a routine prenatal visit:  You will be weighed to make sure you and the baby are growing normally.  Your blood pressure will be taken.  Your abdomen will be measured to track your baby's growth.  The fetal heartbeat will be listened to.  Any test results from the previous visit will be discussed.  You may have a cervical check near your due date to see if your cervix has softened or thinned (effaced).  You will be tested for Group B streptococcus. This happens between 35 and 37 weeks. Your health care provider may ask you:  What your birth plan is.  How you are feeling.  If you are feeling the baby move.  If you have had any abnormal   symptoms, such as leaking fluid, bleeding, severe headaches, or abdominal cramping.  If you are using any tobacco products, including cigarettes, chewing tobacco, and electronic cigarettes.  If you have any questions. Other tests or screenings that may be performed during your third trimester include:  Blood tests that check for low iron levels (anemia).  Fetal testing to check the health, activity level, and growth of the fetus. Testing is done if you have certain medical conditions or if there are problems during the pregnancy.  Nonstress test  (NST). This test checks the health of your baby to make sure there are no signs of problems, such as the baby not getting enough oxygen. During this test, a belt is placed around your belly. The baby is made to move, and its heart rate is monitored during movement. What is false labor? False labor is a condition in which you feel small, irregular tightenings of the muscles in the womb (contractions) that usually go away with rest, changing position, or drinking water. These are called Braxton Hicks contractions. Contractions may last for hours, days, or even weeks before true labor sets in. If contractions come at regular intervals, become more frequent, increase in intensity, or become painful, you should see your health care provider. What are the signs of labor?  Abdominal cramps.  Regular contractions that start at 10 minutes apart and become stronger and more frequent with time.  Contractions that start on the top of the uterus and spread down to the lower abdomen and back.  Increased pelvic pressure and dull back pain.  A watery or bloody mucus discharge that comes from the vagina.  Leaking of amniotic fluid. This is also known as your "water breaking." It could be a slow trickle or a gush. Let your health care provider know if it has a color or strange odor. If you have any of these signs, call your health care provider right away, even if it is before your due date. Follow these instructions at home: Medicines  Follow your health care provider's instructions regarding medicine use. Specific medicines may be either safe or unsafe to take during pregnancy.  Take a prenatal vitamin that contains at least 600 micrograms (mcg) of folic acid.  If you develop constipation, try taking a stool softener if your health care provider approves. Eating and drinking   Eat a balanced diet that includes fresh fruits and vegetables, whole grains, good sources of protein such as meat, eggs, or tofu,  and low-fat dairy. Your health care provider will help you determine the amount of weight gain that is right for you.  Avoid raw meat and uncooked cheese. These carry germs that can cause birth defects in the baby.  If you have low calcium intake from food, talk to your health care provider about whether you should take a daily calcium supplement.  Eat four or five small meals rather than three large meals a day.  Limit foods that are high in fat and processed sugars, such as fried and sweet foods.  To prevent constipation: ? Drink enough fluid to keep your urine clear or pale yellow. ? Eat foods that are high in fiber, such as fresh fruits and vegetables, whole grains, and beans. Activity  Exercise only as directed by your health care provider. Most women can continue their usual exercise routine during pregnancy. Try to exercise for 30 minutes at least 5 days a week. Stop exercising if you experience uterine contractions.  Avoid heavy lifting.  Do   not exercise in extreme heat or humidity, or at high altitudes.  Wear low-heel, comfortable shoes.  Practice good posture.  You may continue to have sex unless your health care provider tells you otherwise. Relieving pain and discomfort  Take frequent breaks and rest with your legs elevated if you have leg cramps or low back pain.  Take warm sitz baths to soothe any pain or discomfort caused by hemorrhoids. Use hemorrhoid cream if your health care provider approves.  Wear a good support bra to prevent discomfort from breast tenderness.  If you develop varicose veins: ? Wear support pantyhose or compression stockings as told by your healthcare provider. ? Elevate your feet for 15 minutes, 3-4 times a day. Prenatal care  Write down your questions. Take them to your prenatal visits.  Keep all your prenatal visits as told by your health care provider. This is important. Safety  Wear your seat belt at all times when driving.  Make  a list of emergency phone numbers, including numbers for family, friends, the hospital, and police and fire departments. General instructions  Avoid cat litter boxes and soil used by cats. These carry germs that can cause birth defects in the baby. If you have a cat, ask someone to clean the litter box for you.  Do not travel far distances unless it is absolutely necessary and only with the approval of your health care provider.  Do not use hot tubs, steam rooms, or saunas.  Do not drink alcohol.  Do not use any products that contain nicotine or tobacco, such as cigarettes and e-cigarettes. If you need help quitting, ask your health care provider.  Do not use any medicinal herbs or unprescribed drugs. These chemicals affect the formation and growth of the baby.  Do not douche or use tampons or scented sanitary pads.  Do not cross your legs for long periods of time.  To prepare for the arrival of your baby: ? Take prenatal classes to understand, practice, and ask questions about labor and delivery. ? Make a trial run to the hospital. ? Visit the hospital and tour the maternity area. ? Arrange for maternity or paternity leave through employers. ? Arrange for family and friends to take care of pets while you are in the hospital. ? Purchase a rear-facing car seat and make sure you know how to install it in your car. ? Pack your hospital bag. ? Prepare the baby's nursery. Make sure to remove all pillows and stuffed animals from the baby's crib to prevent suffocation.  Visit your dentist if you have not gone during your pregnancy. Use a soft toothbrush to brush your teeth and be gentle when you floss. Contact a health care provider if:  You are unsure if you are in labor or if your water has broken.  You become dizzy.  You have mild pelvic cramps, pelvic pressure, or nagging pain in your abdominal area.  You have lower back pain.  You have persistent nausea, vomiting, or diarrhea.   You have an unusual or bad smelling vaginal discharge.  You have pain when you urinate. Get help right away if:  Your water breaks before 37 weeks.  You have regular contractions less than 5 minutes apart before 37 weeks.  You have a fever.  You are leaking fluid from your vagina.  You have spotting or bleeding from your vagina.  You have severe abdominal pain or cramping.  You have rapid weight loss or weight gain.  You have  shortness of breath with chest pain.  You notice sudden or extreme swelling of your face, hands, ankles, feet, or legs.  Your baby makes fewer than 10 movements in 2 hours.  You have severe headaches that do not go away when you take medicine.  You have vision changes. Summary  The third trimester is from week 28 through week 40, months 7 through 9. The third trimester is a time when the unborn baby (fetus) is growing rapidly.  During the third trimester, your discomfort may increase as you and your baby continue to gain weight. You may have abdominal, leg, and back pain, sleeping problems, and an increased need to urinate.  During the third trimester your breasts will keep growing and they will continue to become tender. A yellow fluid (colostrum) may leak from your breasts. This is the first milk you are producing for your baby.  False labor is a condition in which you feel small, irregular tightenings of the muscles in the womb (contractions) that eventually go away. These are called Braxton Hicks contractions. Contractions may last for hours, days, or even weeks before true labor sets in.  Signs of labor can include: abdominal cramps; regular contractions that start at 10 minutes apart and become stronger and more frequent with time; watery or bloody mucus discharge that comes from the vagina; increased pelvic pressure and dull back pain; and leaking of amniotic fluid. This information is not intended to replace advice given to you by your health  care provider. Make sure you discuss any questions you have with your health care provider. Document Released: 06/05/2001 Document Revised: 10/02/2018 Document Reviewed: 07/17/2016 Elsevier Patient Education  2020 [employer].

## 2019-06-02 ENCOUNTER — Other Ambulatory Visit (HOSPITAL_COMMUNITY): Payer: Self-pay | Admitting: *Deleted

## 2019-06-02 ENCOUNTER — Encounter (HOSPITAL_COMMUNITY): Payer: Self-pay

## 2019-06-02 ENCOUNTER — Ambulatory Visit (HOSPITAL_COMMUNITY)
Admission: RE | Admit: 2019-06-02 | Discharge: 2019-06-02 | Disposition: A | Discharge status: Home / Self Care | Payer: BC Managed Care – PPO | Source: Ambulatory Visit | Attending: Maternal & Fetal Medicine | Admitting: Maternal & Fetal Medicine

## 2019-06-02 ENCOUNTER — Ambulatory Visit (HOSPITAL_COMMUNITY): Payer: BC Managed Care – PPO | Admitting: *Deleted

## 2019-06-02 DIAGNOSIS — O09893 Supervision of other high risk pregnancies, third trimester: Secondary | ICD-10-CM

## 2019-06-02 DIAGNOSIS — O09899 Supervision of other high risk pregnancies, unspecified trimester: Secondary | ICD-10-CM | POA: Diagnosis present

## 2019-06-02 DIAGNOSIS — O10013 Pre-existing essential hypertension complicating pregnancy, third trimester: Secondary | ICD-10-CM

## 2019-06-02 DIAGNOSIS — O0990 Supervision of high risk pregnancy, unspecified, unspecified trimester: Secondary | ICD-10-CM | POA: Insufficient documentation

## 2019-06-02 DIAGNOSIS — E669 Obesity, unspecified: Secondary | ICD-10-CM

## 2019-06-02 DIAGNOSIS — Z3A28 28 weeks gestation of pregnancy: Secondary | ICD-10-CM

## 2019-06-02 DIAGNOSIS — O09293 Supervision of pregnancy with other poor reproductive or obstetric history, third trimester: Secondary | ICD-10-CM | POA: Diagnosis not present

## 2019-06-02 DIAGNOSIS — D279 Benign neoplasm of unspecified ovary: Secondary | ICD-10-CM

## 2019-06-02 DIAGNOSIS — O3483 Maternal care for other abnormalities of pelvic organs, third trimester: Secondary | ICD-10-CM | POA: Diagnosis not present

## 2019-06-02 DIAGNOSIS — O99213 Obesity complicating pregnancy, third trimester: Secondary | ICD-10-CM | POA: Diagnosis not present

## 2019-06-02 DIAGNOSIS — Z362 Encounter for other antenatal screening follow-up: Secondary | ICD-10-CM | POA: Diagnosis not present

## 2019-06-02 DIAGNOSIS — O10919 Unspecified pre-existing hypertension complicating pregnancy, unspecified trimester: Secondary | ICD-10-CM | POA: Diagnosis not present

## 2019-06-05 IMAGING — US US ABDOMEN LIMITED
1 series · 15 of 25 positions shown · non-contrast
Comparison: None.

CLINICAL DATA: Abnormal liver function tests.

EXAM:
ULTRASOUND ABDOMEN LIMITED RIGHT UPPER QUADRANT

[Series 1: us abdomen limited · 15 of 39 slices shown]
[im 1/39]
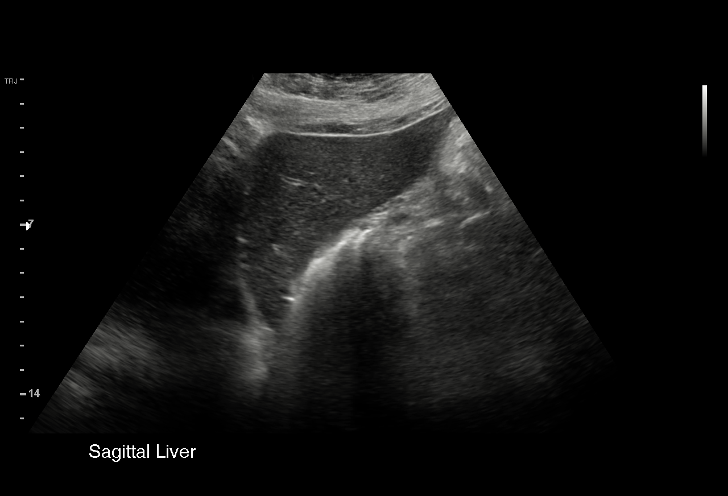
[im 4/39]
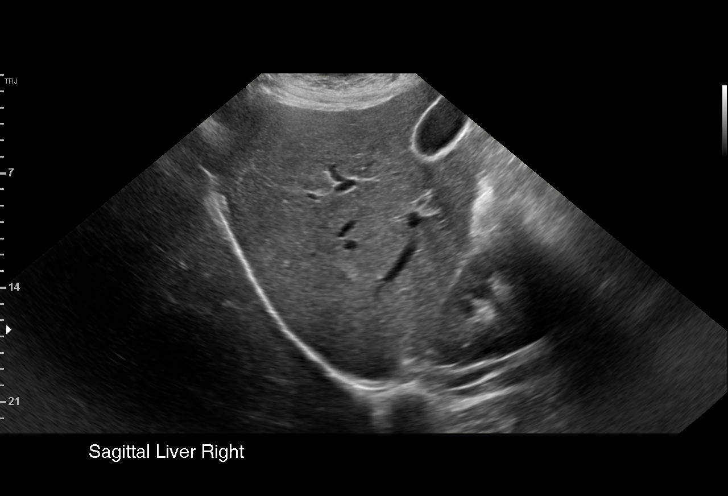
[im 7/39]
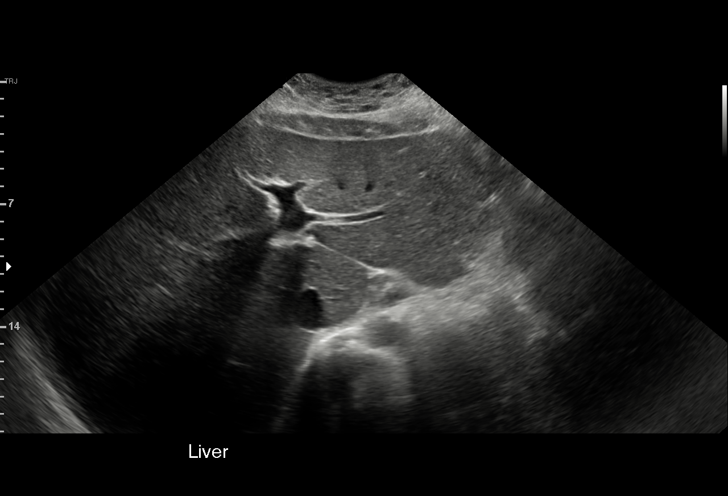
[im 8/39]
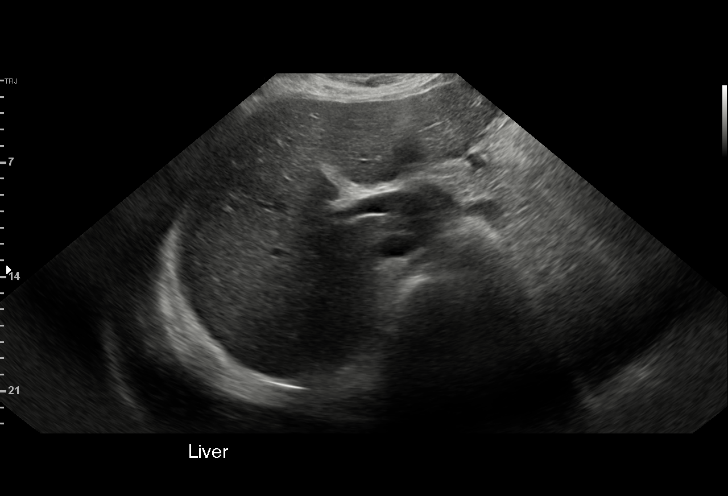
[im 12/39]
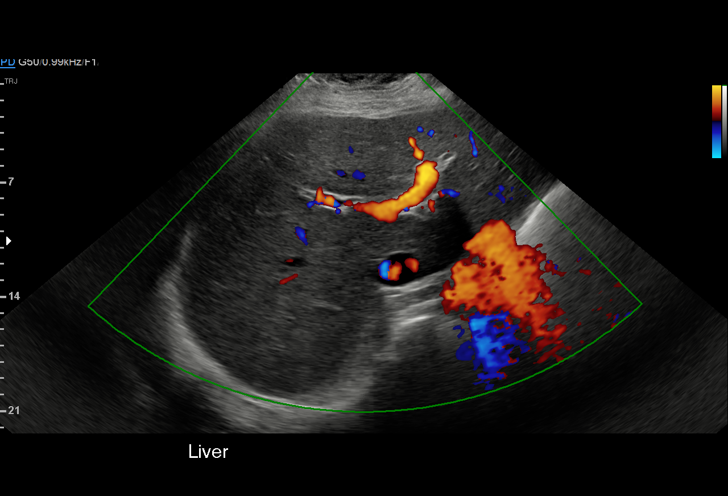
[im 15/39]
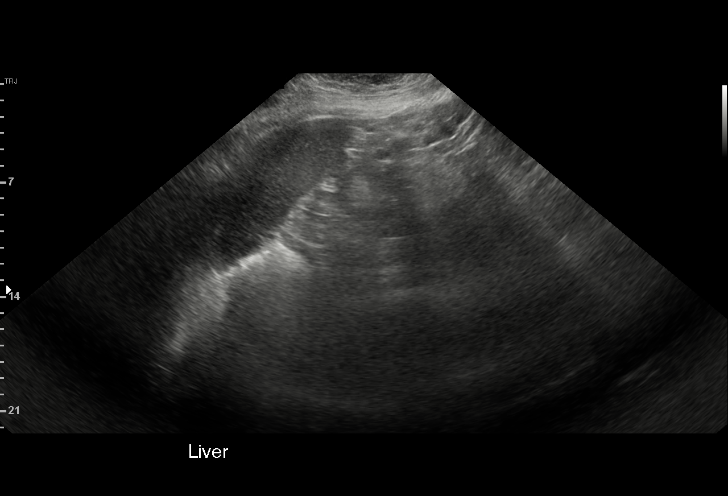
[im 16/39]
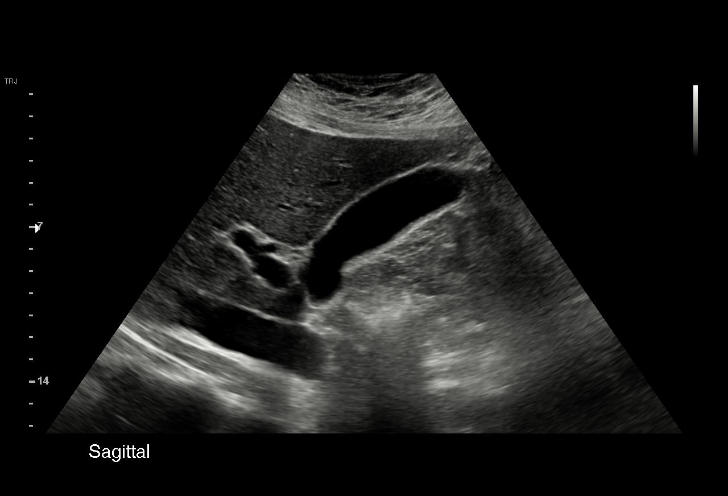
[im 20/39]
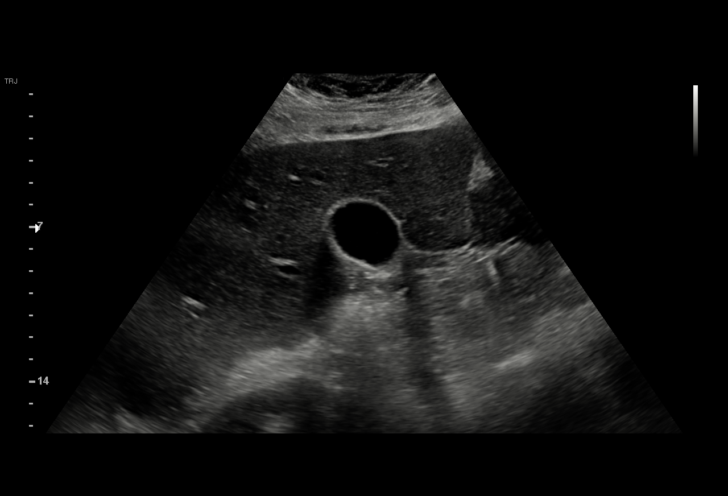
[im 23/39]
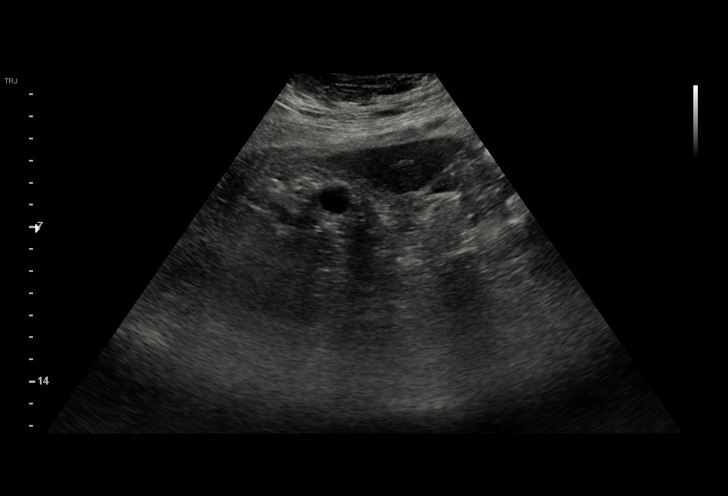
[im 24/39]
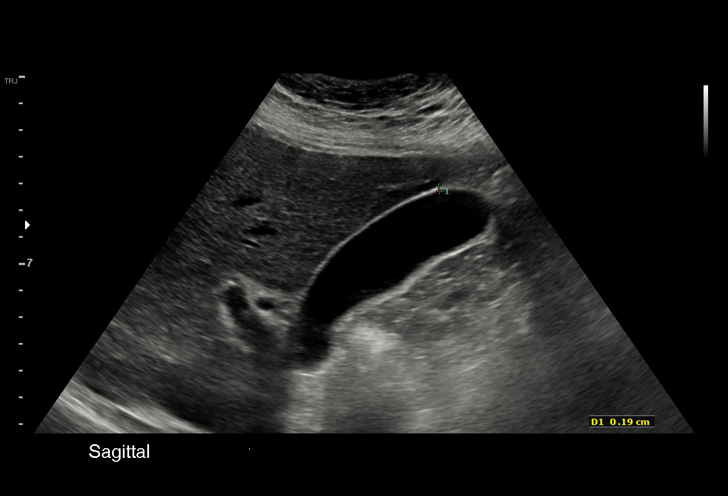
[im 27/39]
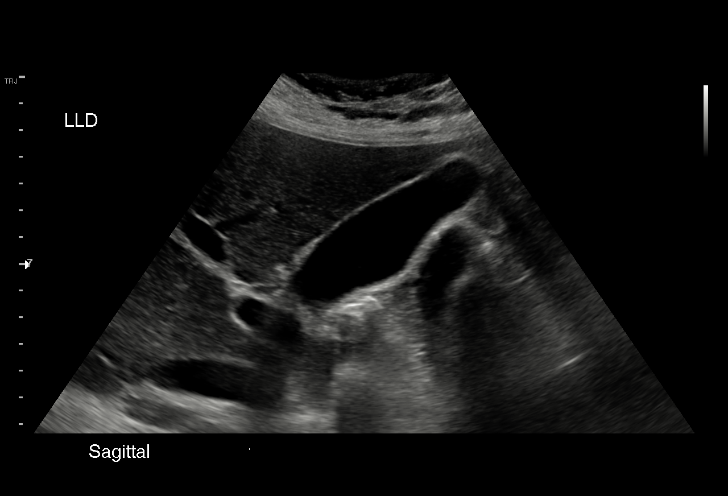
[im 31/39]
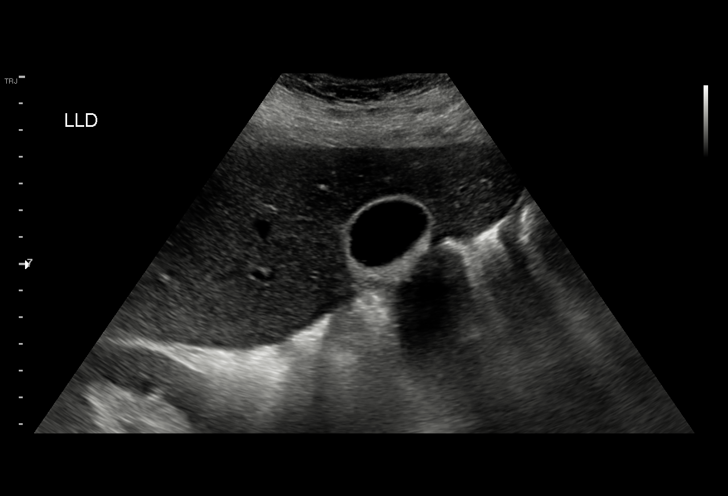
[im 32/39]
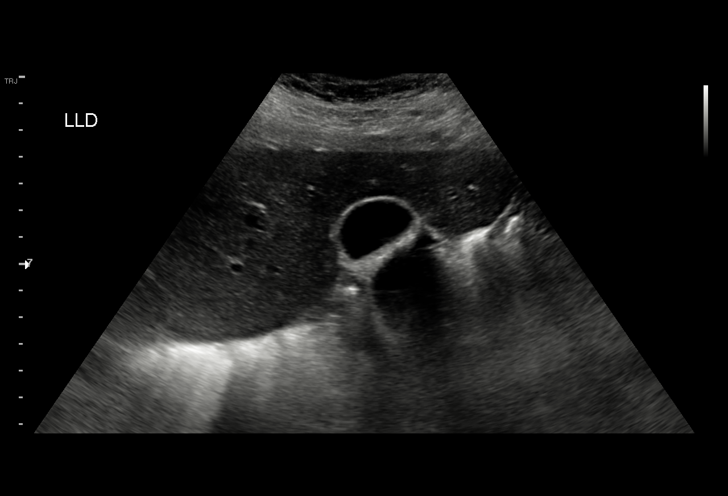
[im 35/39]
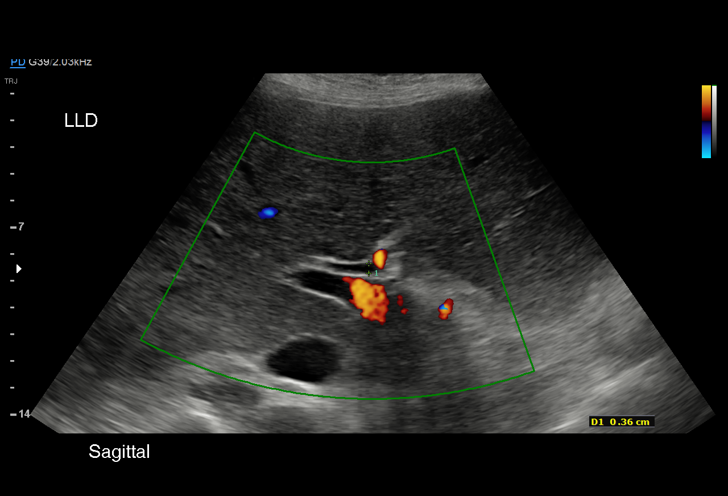
[im 39/39]
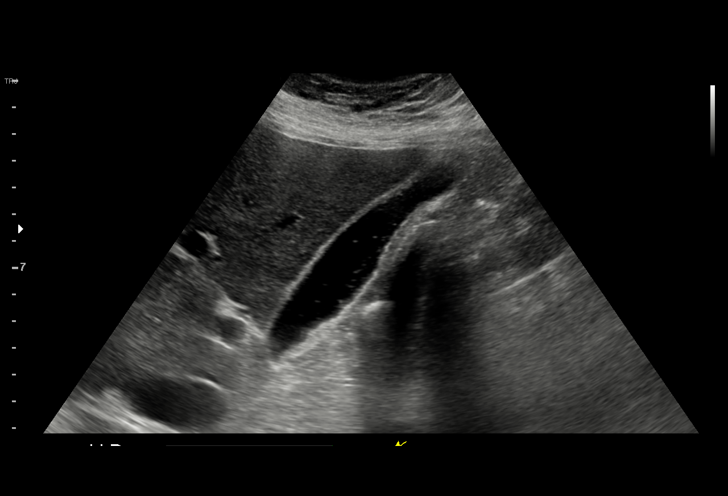

[15 of 25 positions shown; findings below may reference images not displayed]

FINDINGS: Gallbladder:

No discrete gallstones. There are floating echogenic foci consistent
with cholesterol crystals. No wall thickening or pericholecystic
fluid.

Common bile duct:

Diameter: 4 mm

Liver:

No focal lesion identified. Within normal limits in parenchymal
echogenicity. Portal vein is patent on color Doppler imaging with
normal direction of blood flow towards the liver.
IMPRESSION: 1. No acute findings.
2. Some floating debris in the gallstone consistent with cholesterol
crystals. No gallstones or evidence of acute cholecystitis.
3. Normal sonographic appearance of the liver.

## 2019-06-09 ENCOUNTER — Other Ambulatory Visit: Payer: Self-pay

## 2019-06-09 ENCOUNTER — Other Ambulatory Visit: Payer: BC Managed Care – PPO

## 2019-06-09 DIAGNOSIS — O0990 Supervision of high risk pregnancy, unspecified, unspecified trimester: Secondary | ICD-10-CM

## 2019-06-10 ENCOUNTER — Other Ambulatory Visit: Payer: Self-pay | Admitting: *Deleted

## 2019-06-10 LAB — RPR: RPR Ser Ql: NONREACTIVE

## 2019-06-10 LAB — CBC
Hematocrit: 37 % (ref 34.0–46.6)
Hemoglobin: 11.4 g/dL (ref 11.1–15.9)
MCH: 22.4 pg — ABNORMAL LOW (ref 26.6–33.0)
MCHC: 30.8 g/dL — ABNORMAL LOW (ref 31.5–35.7)
MCV: 73 fL — ABNORMAL LOW (ref 79–97)
Platelets: 262 10*3/uL (ref 150–450)
RBC: 5.08 x10E6/uL (ref 3.77–5.28)
RDW: 17 % — ABNORMAL HIGH (ref 11.7–15.4)
WBC: 11.4 10*3/uL — ABNORMAL HIGH (ref 3.4–10.8)

## 2019-06-10 LAB — HIV ANTIBODY (ROUTINE TESTING W REFLEX): HIV Screen 4th Generation wRfx: NONREACTIVE

## 2019-06-10 LAB — GLUCOSE TOLERANCE, 2 HOURS W/ 1HR
Glucose, 1 hour: 163 mg/dL (ref 65–179)
Glucose, 2 hour: 101 mg/dL (ref 65–152)
Glucose, Fasting: 96 mg/dL — ABNORMAL HIGH (ref 65–91)

## 2019-06-10 MED ORDER — BUTALBITAL-APAP-CAFFEINE 50-325-40 MG PO CAPS: 1.0000 | ORAL_CAPSULE | Freq: Four times a day (QID) | ORAL | 1 refills | Status: DC | PRN

## 2019-06-11 ENCOUNTER — Telehealth: Payer: Self-pay | Admitting: *Deleted

## 2019-06-11 ENCOUNTER — Encounter: Payer: Self-pay | Admitting: Obstetrics & Gynecology

## 2019-06-11 ENCOUNTER — Encounter: Payer: Self-pay | Admitting: *Deleted

## 2019-06-11 DIAGNOSIS — O24419 Gestational diabetes mellitus in pregnancy, unspecified control: Secondary | ICD-10-CM

## 2019-06-11 DIAGNOSIS — O0990 Supervision of high risk pregnancy, unspecified, unspecified trimester: Secondary | ICD-10-CM

## 2019-06-11 DIAGNOSIS — O24415 Gestational diabetes mellitus in pregnancy, controlled by oral hypoglycemic drugs: Secondary | ICD-10-CM | POA: Insufficient documentation

## 2019-06-11 MED ORDER — ACCU-CHEK GUIDE VI STRP: ORAL_STRIP | 12 refills | Status: DC

## 2019-06-11 MED ORDER — ACCU-CHEK SOFTCLIX LANCETS MISC: 12 refills | Status: DC

## 2019-06-11 MED ORDER — ACCU-CHEK GUIDE W/DEVICE KIT: 1.0000 | PACK | Freq: Four times a day (QID) | 0 refills | Status: DC

## 2019-06-11 NOTE — Telephone Encounter (Signed)
-----   Message from Osborne Oman, MD sent at 06/11/2019 11:19 AM EST ----- Patient's 2 hr GTT is abnormal and is consistent with Gestational Diabetes [ ]  GDM education and testing supplies [ ]  Nutrition consult [ ]  Growth scan around time of diagnosis, if indicated [ ]  Enroll in [employer] Diabetes Program Please call to inform patient of results and recommendations.

## 2019-06-11 NOTE — Telephone Encounter (Signed)
Called pt to inform her of GDM diagnosis and recommendations. Informed supplies will be sent to pharmacy and informed how often to check blood sugars. Informed of referral being sent into Diabetes and Nutrition and will send another link to [facility] app, pt understands that if she has trouble with the app to write down blood sugars to be able to show providers at her visits.

## 2019-06-12 ENCOUNTER — Telehealth: Payer: Self-pay

## 2019-06-12 NOTE — Telephone Encounter (Signed)
Call received from [employer] with elevated BP alert. BP 142/81; rechecked 125/80. Pt not seen at [facility], encounter routed to correct office for follow-up.

## 2019-06-17 ENCOUNTER — Other Ambulatory Visit: Payer: Self-pay | Admitting: *Deleted

## 2019-06-17 IMAGING — US US MFM FETAL BPP W/O NON-STRESS
1 series · 16 of 28 positions shown · non-contrast
Comparison: none

[Series 1: us mfm fetal bpp w/o non-stress · 37 acquisitions, 16 frames shown]
[im 1/37]
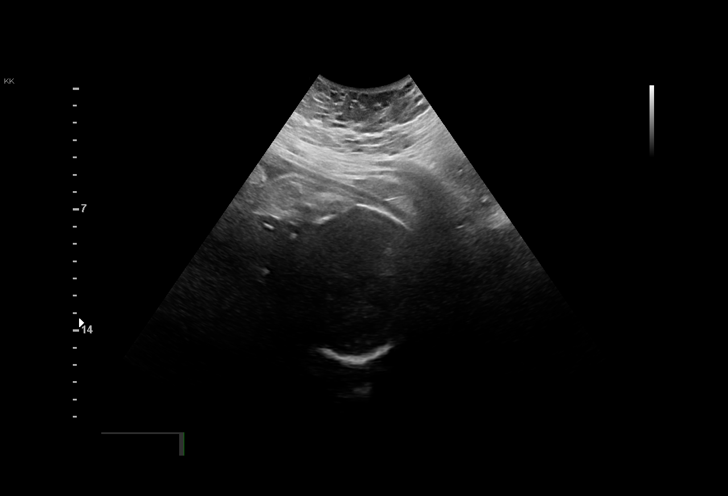
[im 3/37]
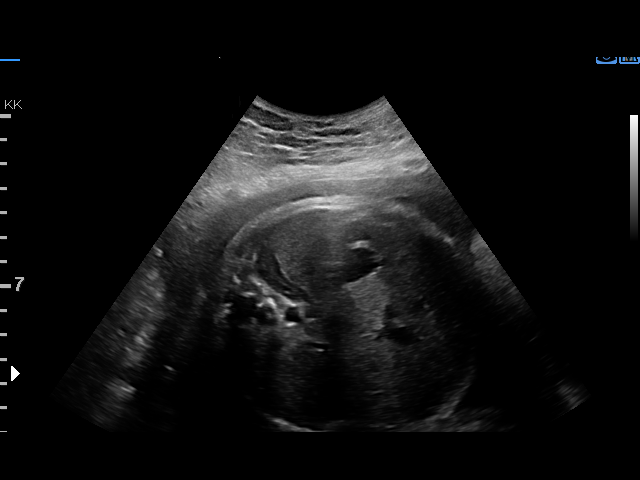
[im 6/37]
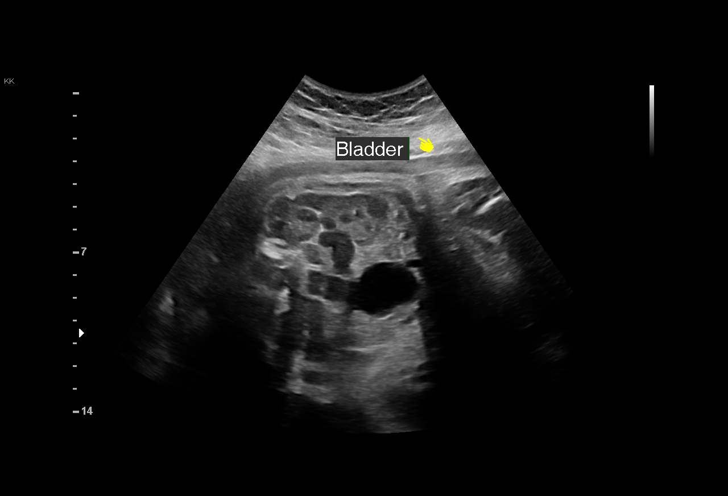
[im 9/37]
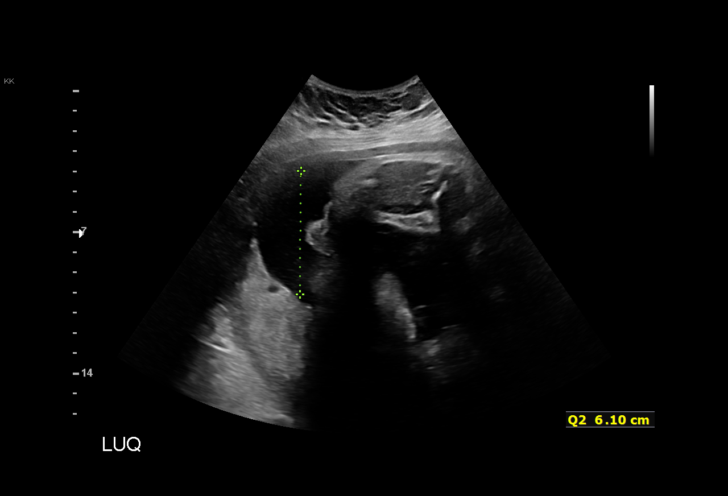
[im 10/37]
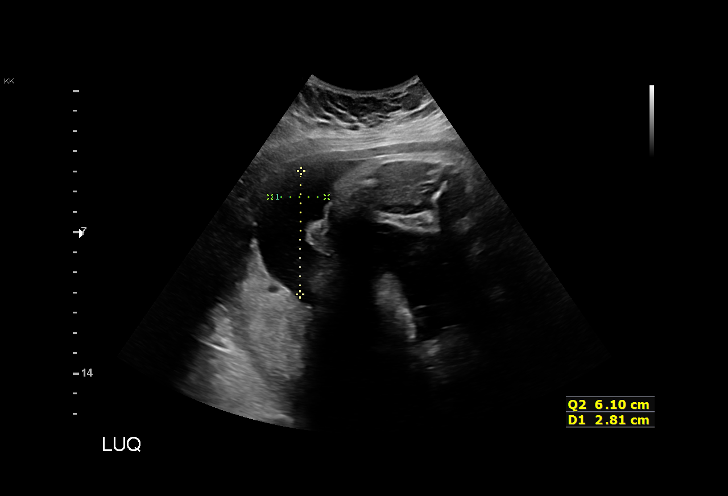
[im 13/37]
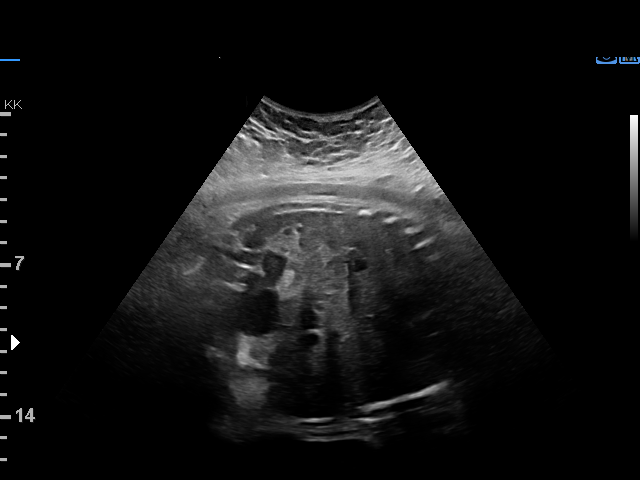
[im 15/37]
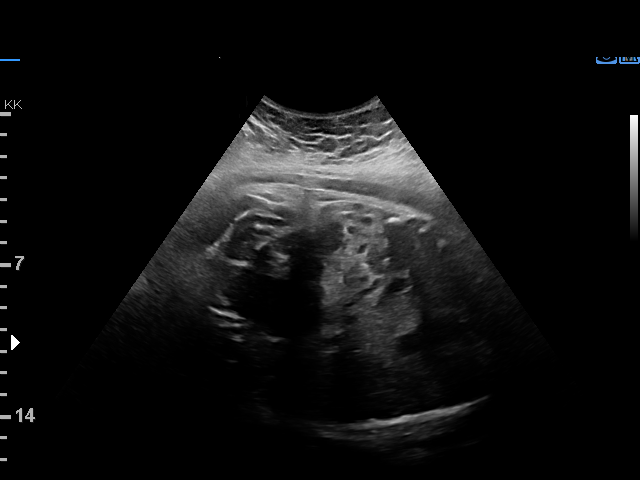
[im 18/37]
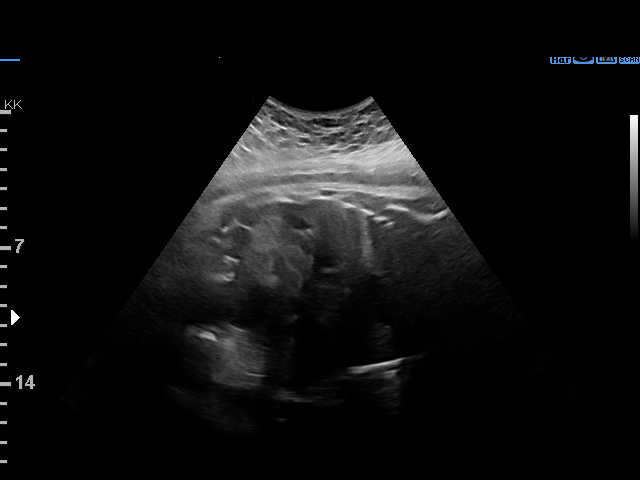
[im 19/37]
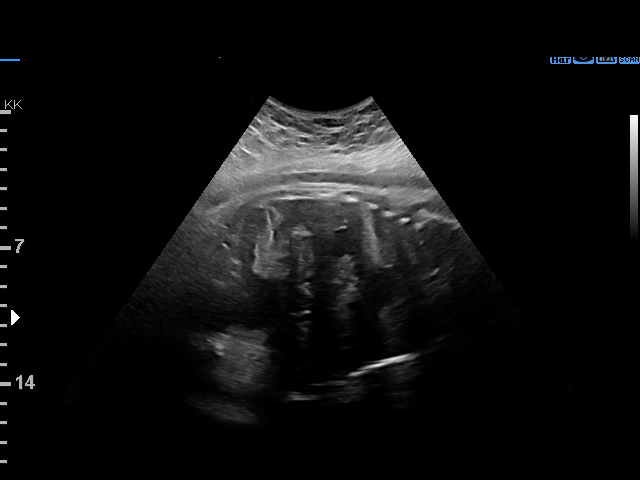
[im 22/37]
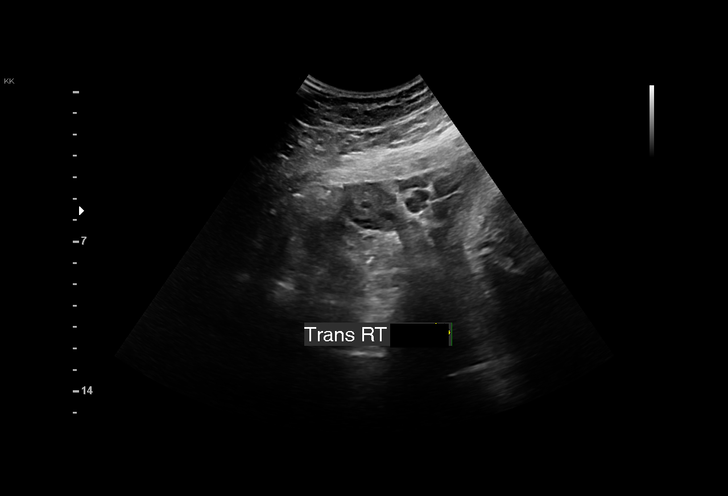
[im 25/37]
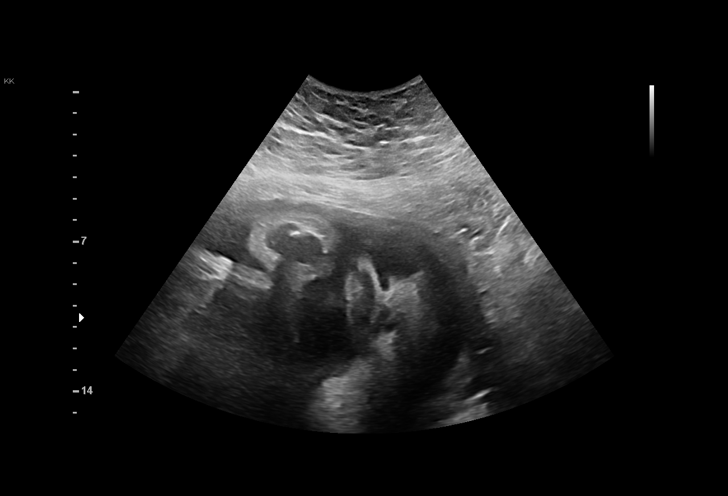
[im 27/37]
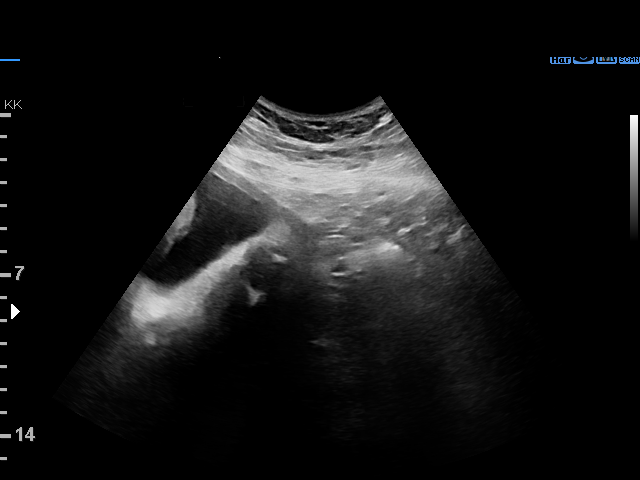
[im 29/37]
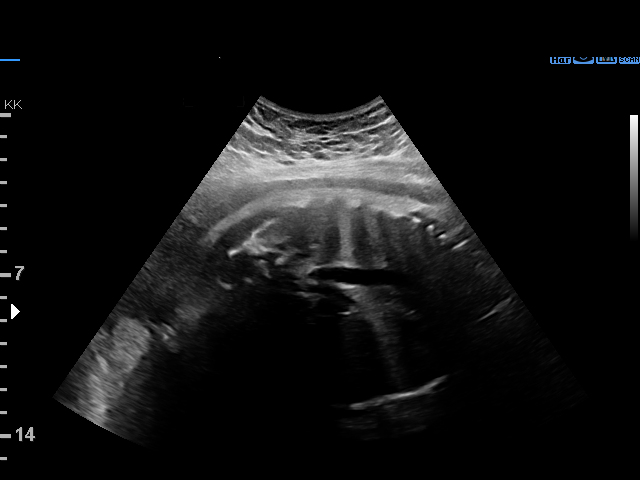
[im 31/37]
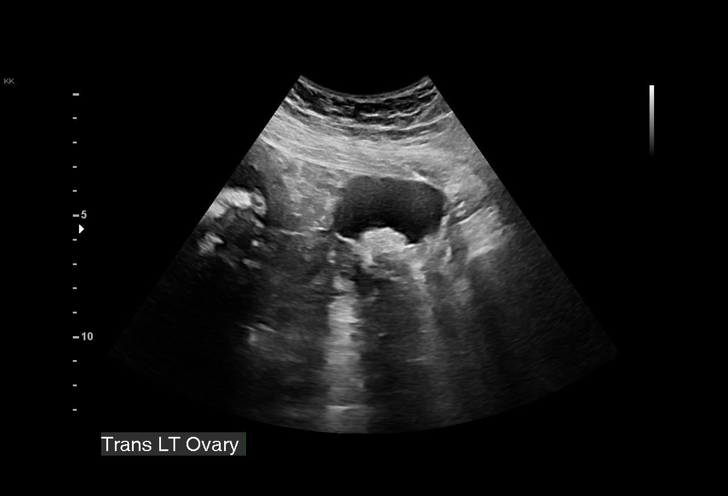
[im 34/37]
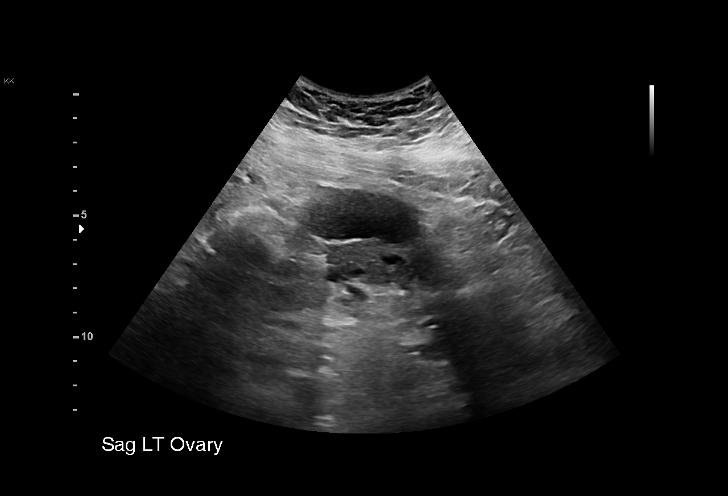
[im 37/37]
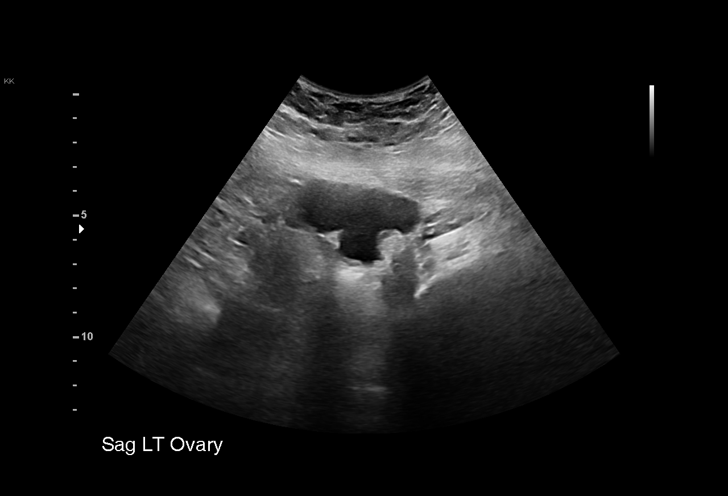

[16 of 28 positions shown; findings below may reference images not displayed]

Attending:        Adam Janusz Fuka      Secondary Phy.:    3rd Nursing- [employer]
                                                             OB

 ----------------------------------------------------------------------

 ----------------------------------------------------------------------
Indications

  35 weeks gestation of pregnancy
  Obesity complicating pregnancy, third
  trimester
  Hypertension - Chronic/Pre-existing
  (labetalol)
  35 weeks gestation of pregnancy
 ----------------------------------------------------------------------
Vital Signs

 BMI:
Fetal Evaluation

 Num Of Fetuses:          1
 Fetal Heart Rate(bpm):   158
 Cardiac Activity:        Observed
 Presentation:            Cephalic

 Amniotic Fluid
 AFI FV:      Within normal limits

 AFI Sum(cm)     %Tile       Largest Pocket(cm)
 11.42           32

 RUQ(cm)                     LUQ(cm)        LLQ(cm)

Biophysical Evaluation

 Amniotic F.V:   Pocket => 2 cm two         F. Tone:         Observed
                 planes
 F. Movement:    Observed                   Score:           [DATE]
 F. Breathing:   Observed
OB History

 Gravidity:    1         Term:   0        Prem:   0        SAB:   0
 TOP:          0       Ectopic:  0        Living: 0
Gestational Age

 LMP:           35w 6d        Date:  09/10/17                 EDD:   06/17/18
 Best:          35w 6d     Det. By:  LMP  (09/10/17)          EDD:   06/17/18
Impression

 Biophysical profile [DATE]
 Inpatient management for elevated LFT's
Recommendations

 Follow up per inpatient team
 If discharged continue serial growth and weekly testing.

## 2019-06-17 MED ORDER — ACCU-CHEK GUIDE VI STRP: ORAL_STRIP | 0 refills | Status: DC

## 2019-06-22 ENCOUNTER — Other Ambulatory Visit: Payer: Self-pay

## 2019-06-22 ENCOUNTER — Encounter: Payer: Self-pay | Admitting: Obstetrics and Gynecology

## 2019-06-22 ENCOUNTER — Telehealth (INDEPENDENT_AMBULATORY_CARE_PROVIDER_SITE_OTHER): Payer: BC Managed Care – PPO | Admitting: Obstetrics and Gynecology

## 2019-06-22 VITALS — BP 128/72

## 2019-06-22 DIAGNOSIS — F32A Depression, unspecified: Secondary | ICD-10-CM

## 2019-06-22 DIAGNOSIS — O10919 Unspecified pre-existing hypertension complicating pregnancy, unspecified trimester: Secondary | ICD-10-CM

## 2019-06-22 DIAGNOSIS — G4700 Insomnia, unspecified: Secondary | ICD-10-CM

## 2019-06-22 DIAGNOSIS — O0993 Supervision of high risk pregnancy, unspecified, third trimester: Secondary | ICD-10-CM

## 2019-06-22 DIAGNOSIS — Z3A30 30 weeks gestation of pregnancy: Secondary | ICD-10-CM

## 2019-06-22 DIAGNOSIS — D271 Benign neoplasm of left ovary: Secondary | ICD-10-CM

## 2019-06-22 DIAGNOSIS — O24415 Gestational diabetes mellitus in pregnancy, controlled by oral hypoglycemic drugs: Secondary | ICD-10-CM

## 2019-06-22 DIAGNOSIS — F329 Major depressive disorder, single episode, unspecified: Secondary | ICD-10-CM

## 2019-06-22 DIAGNOSIS — O99013 Anemia complicating pregnancy, third trimester: Secondary | ICD-10-CM

## 2019-06-22 DIAGNOSIS — O10913 Unspecified pre-existing hypertension complicating pregnancy, third trimester: Secondary | ICD-10-CM

## 2019-06-22 DIAGNOSIS — O0990 Supervision of high risk pregnancy, unspecified, unspecified trimester: Secondary | ICD-10-CM

## 2019-06-22 MED ORDER — METFORMIN HCL 500 MG PO TABS: ORAL_TABLET | ORAL | 2 refills | Status: DC

## 2019-06-22 NOTE — Progress Notes (Signed)
TELEHEALTH VIRTUAL OBSTETRICS VISIT ENCOUNTER NOTE  Clinic: [facility]  I connected with Sindee Lichtenberg on 06/22/19 at  1:15 PM EST by telephone at home and verified that I am speaking with the correct person using two identifiers.   I discussed the limitations, risks, security and privacy concerns of performing an evaluation and management service by telephone and the availability of in person appointments. I also discussed with the patient that there may be a patient responsible charge related to this service. The patient expressed understanding and agreed to proceed.  Subjective:  Latoya Mora is a 23 y.o. G2P1001 at [redacted]w[redacted]d being followed for ongoing prenatal care.  She is currently monitored for the following issues for this high-risk pregnancy and has Seasonal allergies; Anxiety and depression; Frequent headaches; Obesity, Class III, BMI 40-49.9 (morbid obesity) ([place]); PCOS (polycystic ovarian syndrome); Supervision of high risk pregnancy, antepartum; Dermoid cyst of left ovary; Chronic hypertension complicating pregnancy, antepartum; Short interval between pregnancies affecting pregnancy, antepartum; Stress incontinence during pregnancy; and Gestational diabetes mellitus, antepartum on their problem list.  Patient reports see below. Reports fetal movement. Denies any contractions, bleeding or leaking of fluid.   The following portions of the patient's history were reviewed and updated as appropriate: allergies, current medications, past family history, past medical history, past social history, past surgical history and problem list.   Objective:   Vitals:   06/22/19 1308  BP: 128/72    [employer] Data Reviewed: yes  General:  Alert, oriented and cooperative.   Mental Status: Normal mood and affect perceived. Normal judgment and thought content.  Rest of physical exam deferred due to type of encounter  Assessment and Plan:  Pregnancy: G2P1001 at  [redacted]w[redacted]d 1. Chronic hypertension complicating pregnancy, antepartum Doing well on labetaol 200 bid and low dose asa Rpt growth next week and starts qwk bpp with mfm then Delivery around 39wks  2. Dermoid cyst of left ovary F/u on u/s next week  3. Supervision of high risk pregnancy, antepartum D/w pt more re: BC nv  4. Gestational diabetes mellitus (GDM) controlled on oral hypoglycemic drug, antepartum See RN note for CBG values. Pt eats dinner around 8pm and sleep around midnight to 1am and has a snack in between dinner and bed. Recommend doing metformin 500 with her snack.   5. Anemia during pregnancy in third trimester resolved  6. Insomnia, unspecified type D/w her re: behavioral strategies  7. Anxiety and depression Not on any meds. Continue follow. Partially attributable to medical issues during pregnancy. Refer to [facility] PRN  Preterm labor symptoms and general obstetric precautions including but not limited to vaginal bleeding, contractions, leaking of fluid and fetal movement were reviewed in detail with the patient.  I discussed the assessment and treatment plan with the patient. The patient was provided an opportunity to ask questions and all were answered. The patient agreed with the plan and demonstrated an understanding of the instructions. The patient was advised to call back or seek an in-person office evaluation/go to [facility] at [facility] for any urgent or concerning symptoms. Please refer to After Visit Summary for other counseling recommendations.   I provided 10 minutes of non-face-to-face time during this encounter. The visit was conducted via MyChart-medicine  Return in about 1 week (around 06/29/2019) for virtual visit, high risk.  Future Appointments  Date Time Provider [place]  06/24/2019  9:00 AM [place], Guadalupe Maple, RN [facility] None  07/03/2019  3:15 PM [place] 4  [facility] M[facility]-US  07/03/2019  3:20 PM [place] NURSE [place] [facility]-US  07/08/2019   8:45 AM [place] 2 [facility]-MFCUS MFC-US  07/08/2019  8:50 AM WH-MFC NURSE WH-MFC MFC-US  07/08/2019  2:00 PM Ernestina Patches, Juanita Craver, MD [facility]    Aletha Halim, [place] for [facility], [place]

## 2019-06-22 NOTE — Progress Notes (Signed)
I connected with  Latoya Mora on 06/22/19 at  1:15 PM EST by telephone and verified that I am speaking with the correct person using two identifiers.   I discussed the limitations, risks, security and privacy concerns of performing an evaluation and management service by telephone and the availability of in person appointments. I also discussed with the patient that there may be a patient responsible charge related to this service. The patient expressed understanding and agreed to proceed.  Crosby Oyster, RN 06/22/2019  1:09 PM

## 2019-06-24 ENCOUNTER — Ambulatory Visit: Payer: BC Managed Care – PPO | Admitting: *Deleted

## 2019-06-25 ENCOUNTER — Encounter: Payer: Self-pay | Admitting: *Deleted

## 2019-06-25 ENCOUNTER — Other Ambulatory Visit: Payer: Self-pay

## 2019-06-25 ENCOUNTER — Encounter: Payer: BC Managed Care – PPO | Attending: Obstetrics & Gynecology | Admitting: *Deleted

## 2019-06-25 VITALS — BP 134/84 | Ht 68.0 in | Wt 329.8 lb

## 2019-06-25 DIAGNOSIS — Z3A00 Weeks of gestation of pregnancy not specified: Secondary | ICD-10-CM | POA: Diagnosis not present

## 2019-06-25 DIAGNOSIS — Z713 Dietary counseling and surveillance: Secondary | ICD-10-CM | POA: Diagnosis not present

## 2019-06-25 DIAGNOSIS — O24419 Gestational diabetes mellitus in pregnancy, unspecified control: Secondary | ICD-10-CM | POA: Insufficient documentation

## 2019-06-25 DIAGNOSIS — O0990 Supervision of high risk pregnancy, unspecified, unspecified trimester: Secondary | ICD-10-CM | POA: Insufficient documentation

## 2019-06-25 DIAGNOSIS — O24415 Gestational diabetes mellitus in pregnancy, controlled by oral hypoglycemic drugs: Secondary | ICD-10-CM

## 2019-06-25 NOTE — Patient Instructions (Signed)
Read booklet on Gestational Diabetes Follow Gestational Meal Planning Guidelines Don't skip meals Avoid cold cereal for breakfast Include protein serving with snacks - especially when eating fruit Complete a 3 Day Food Record and have available at next appointment Check blood sugars 4 x day - before breakfast and 2 hrs after every meal and record  Bring blood sugar log to all appointments Purchase urine ketone strips if ordered by MD and check urine ketones every am:  If + increase bedtime snack to 1 protein and 2 carbohydrate servings Walk 20-30 minutes at least 5 x week if permitted by MD

## 2019-06-25 NOTE — Progress Notes (Signed)
Diabetes Self-Management Education  Visit Type: First/Initial  Appt. Start Time: 1410 Appt. End Time: [id number]  06/25/2019  Latoya Mora, identified by name and date of birth, is a 23 y.o. female with a diagnosis of Diabetes: Gestational Diabetes.   ASSESSMENT  Blood pressure 134/84, height 5\' 8"  (1.727 m), weight (!) 329 lb 12.8 oz (149.6 kg), last menstrual period 11/14/2018, estimated date of delivery 08/25/2019. Body mass index is 50.15 kg/m.  Diabetes Self-Management Education - 06/25/19 1516      Visit Information   Visit Type  First/Initial      Initial Visit   Diabetes Type  Gestational Diabetes    Are you currently following a meal plan?  Yes    What type of meal plan do you follow?  "lower carb intake"    Are you taking your medications as prescribed?  Yes    Date Diagnosed  less than 2 weeks      Health Coping   How would you rate your overall health?  Good      Psychosocial Assessment   Patient Belief/Attitude about Diabetes  Motivated to manage diabetes    Self-care barriers  None    Self-management support  Doctor's office;Family    Patient Concerns  Nutrition/Meal planning;Glycemic Control;Monitoring    Special Needs  None    Preferred Learning Style  Visual    Learning Readiness  Change in progress    How often do you need to have someone help you when you read instructions, pamphlets, or other written materials from your doctor or pharmacy?  1 - Never    What is the last grade level you completed in school?  some college      Pre-Education Assessment   Patient understands the diabetes disease and treatment process.  Needs Instruction    Patient understands incorporating nutritional management into lifestyle.  Needs Instruction    Patient undertands incorporating physical activity into lifestyle.  Needs Instruction    Patient understands using medications safely.  Needs Review    Patient understands monitoring blood glucose, interpreting and using  results  Needs Review    Patient understands prevention, detection, and treatment of acute complications.  Needs Instruction    Patient understands prevention, detection, and treatment of chronic complications.  Needs Instruction    Patient understands how to develop strategies to address psychosocial issues.  Needs Instruction    Patient understands how to develop strategies to promote health/change behavior.  Needs Instruction      Complications   Last HgB A1C per patient/outside source  5.2 %   02/27/2019   How often do you check your blood sugar?  3-4 times/day    Fasting Blood glucose range (mg/dL)  70-129   Pt reports FBG's 100-110 mg/dL   Postprandial Blood glucose range (mg/dL)  70-129   Pt reports pp's 97-117 mg/dL.   Have you had a dilated eye exam in the past 12 months?  Yes    Have you had a dental exam in the past 12 months?  Yes    Are you checking your feet?  No      Dietary Intake   Breakfast  eggs, toast, [place] bacon; ceral and milk    Lunch  usually skips - may have a meat sandwich    Snack (afternoon)  fruit - orange, banana, grapes    Dinner  chicken, beef, pork; potatoes, beans, corn, green beans, pasta, broccoli, salads with lettuce, tomatoes, peppers, onions    Snack (  evening)  chips, popcorn    Beverage(s)  water, milk      Exercise   Exercise Type  ADL's      Patient Education   Previous Diabetes Education  No    Disease state   Definition of diabetes, type 1 and 2, and the diagnosis of diabetes;Factors that contribute to the development of diabetes    Nutrition management   Role of diet in the treatment of diabetes and the relationship between the three main macronutrients and blood glucose level;Food label reading, portion sizes and measuring food.;Reviewed blood glucose goals for pre and post meals and how to evaluate the patients' food intake on their blood glucose level.    Physical activity and exercise   Role of exercise on diabetes management, blood  pressure control and cardiac health.    Medications  Reviewed patients medication for diabetes, action, purpose, timing of dose and side effects.    Monitoring  Purpose and frequency of SMBG.;Taught/discussed recording of test results and interpretation of SMBG.;Identified appropriate SMBG and/or A1C goals.;Ketone testing, when, how.    Chronic complications  Relationship between chronic complications and blood glucose control    Psychosocial adjustment  Identified and addressed patients feelings and concerns about diabetes    Preconception care  Pregnancy and GDM  Role of pre-pregnancy blood glucose control on the development of the fetus;Reviewed with patient blood glucose goals with pregnancy;Role of family planning for patients with diabetes      Individualized Goals (developed by patient)   Reducing Risk  Improve blood sugars Prevent diabetes complications     Outcomes   Expected Outcomes  Demonstrated interest in learning. Expect positive outcomes       Individualized Plan for Diabetes Self-Management Training:   Learning Objective:  Patient will have a greater understanding of diabetes self-management. Patient education plan is to attend individual and/or group sessions per assessed needs and concerns.   Plan:  Read booklet on Gestational Diabetes Follow Gestational Meal Planning Guidelines Don't skip meals Avoid cold cereal for breakfast Include protein serving with snacks - especially when eating fruit Complete a 3 Day Food Record and have available at next appointment Check blood sugars 4 x day - before breakfast and 2 hrs after every meal and record  Bring blood sugar log to all appointments Purchase urine ketone strips if ordered by MD and check urine ketones every am:  If + increase bedtime snack to 1 protein and 2 carbohydrate servings Walk 20-30 minutes at least 5 x week if permitted by MD  Expected Outcomes:  Demonstrated interest in learning. Expect positive  outcomes  Education material provided:  Gestational Booklet Gestational Meal Planning Guidelines Simple Meal Plan Viewed Gestational Diabetes Video 3 Day Food Record Goals for a Healthy Pregnancy Food Safety Smart and Sound Snacking  If problems or questions, patient to contact team via:  Johny Drilling, [place], [place], CDE [phone]  Future DSME appointment:  July 02, 2019 for video visit with dietitian

## 2019-06-26 NOTE — L&D Delivery Note (Signed)
Patient: Latoya Mora MRN: [id number]  GBS status: Negative, IAP given: None   Patient is a 24 y.o. now G2P2 s/p NSVD at [redacted]w[redacted]d, who was admitted for IOL for A2GDM, [place]. AROM 2h 38m prior to delivery with clear fluid.    Delivery Note At 4:43 PM a viable female was delivered via  (Presentation:  OA).  APGAR: 4, 8; weight pending.   Placenta status: spontaneous , intact  .  Cord: 3 vessel with the following complications:none.   Anesthesia: Epidural Episiotomy:  N/A  Lacerations:  None  Suture Repair: N/A Est. Blood Loss (mL):  126   Head delivered OA. No nuchal cord present. Shoulder and body delivered in usual fashion. Infant with spontaneous cry, placed on mother's abdomen, dried and bulb suctioned. Cord clamped x 2 after 1-minute delay, and cut by family member. Cord blood drawn. Infant noted to have poor respiratory effort, taken to the warmer for PPV and stimulation by RNs. Placenta delivered spontaneously with gentle cord traction. Fundus firm with massage and Pitocin. Perineum inspected and found to have no lacerations.   Mom to postpartum.  Baby to Couplet care / Skin to Skin.  Melina Schools 08/04/2019, 5:01 PM

## 2019-07-01 ENCOUNTER — Ambulatory Visit (HOSPITAL_COMMUNITY): Payer: BC Managed Care – PPO

## 2019-07-01 ENCOUNTER — Telehealth: Payer: Self-pay | Admitting: *Deleted

## 2019-07-01 ENCOUNTER — Other Ambulatory Visit: Payer: Self-pay | Admitting: Obstetrics & Gynecology

## 2019-07-01 NOTE — Telephone Encounter (Signed)
Called pt in regards to her BP's that baby scripts has called [place] on 1/3-131/80, 1/4-132/83, 1/6-129/92. Pt Informed pt that she only needs to take her BP once a week unless she is feeling bad, or had any pre E symptoms and if she has an appointment with [place] or MFM that can be her weekly BP check. Pt thought she had to take it daily. Pt  verbalizes and understands.

## 2019-07-02 ENCOUNTER — Encounter: Payer: BC Managed Care – PPO | Attending: Obstetrics & Gynecology | Admitting: Dietician

## 2019-07-02 ENCOUNTER — Other Ambulatory Visit: Payer: Self-pay

## 2019-07-02 ENCOUNTER — Telehealth: Payer: BC Managed Care – PPO | Admitting: Dietician

## 2019-07-02 ENCOUNTER — Encounter: Payer: BC Managed Care – PPO | Admitting: Dietician

## 2019-07-02 DIAGNOSIS — O24415 Gestational diabetes mellitus in pregnancy, controlled by oral hypoglycemic drugs: Secondary | ICD-10-CM

## 2019-07-02 DIAGNOSIS — Z713 Dietary counseling and surveillance: Secondary | ICD-10-CM | POA: Insufficient documentation

## 2019-07-02 DIAGNOSIS — Z3A00 Weeks of gestation of pregnancy not specified: Secondary | ICD-10-CM | POA: Insufficient documentation

## 2019-07-02 DIAGNOSIS — O0990 Supervision of high risk pregnancy, unspecified, unspecified trimester: Secondary | ICD-10-CM | POA: Insufficient documentation

## 2019-07-02 DIAGNOSIS — O24419 Gestational diabetes mellitus in pregnancy, unspecified control: Secondary | ICD-10-CM | POA: Diagnosis not present

## 2019-07-02 NOTE — Progress Notes (Signed)
.   Patient reports recent fasting BGs ranging 95-107, and post-meal BGs ranging < 120mg /dl. . Patient's diet recall indicates control of carbohydrate intake and inclusion of protein sources with meals. She reports some challenge including protein with snacks. . Reviewed basic meal planning; discussed options for snacks, particularly betime snack-- advised choices that include fiber and protein such as protein or low sugar granola bar, graham crackers with peanut butter, fruit with nuts or cheese, mix of dry cereal with dried fruit and nuts. . Patient reports understanding of food groups and meal planning.  . Patient has previously been instructed patient on food safety, including avoidance of Listeriosis, and limiting mercury from fish. . Discussed importance of maintaining healthy lifestyle habits to reduce risk of Type 2 DM as well as Gestational DM with any future pregnancies. . Advised patient to use any remaining testing supplies to test some BGs after delivery, and to have BG tested ideally annually, as well as prior to attempting future pregnancies.

## 2019-07-03 ENCOUNTER — Other Ambulatory Visit: Payer: Self-pay | Admitting: Obstetrics & Gynecology

## 2019-07-03 ENCOUNTER — Encounter: Payer: Self-pay | Admitting: Family Medicine

## 2019-07-03 ENCOUNTER — Encounter (HOSPITAL_COMMUNITY): Payer: Self-pay

## 2019-07-03 ENCOUNTER — Ambulatory Visit (HOSPITAL_COMMUNITY): Payer: BC Managed Care – PPO | Admitting: *Deleted

## 2019-07-03 ENCOUNTER — Ambulatory Visit (HOSPITAL_COMMUNITY)
Admission: RE | Admit: 2019-07-03 | Discharge: 2019-07-03 | Disposition: A | Discharge status: Home / Self Care | Payer: BC Managed Care – PPO | Source: Ambulatory Visit | Attending: Maternal & Fetal Medicine | Admitting: Maternal & Fetal Medicine

## 2019-07-03 DIAGNOSIS — O10013 Pre-existing essential hypertension complicating pregnancy, third trimester: Secondary | ICD-10-CM | POA: Diagnosis not present

## 2019-07-03 DIAGNOSIS — Z362 Encounter for other antenatal screening follow-up: Secondary | ICD-10-CM

## 2019-07-03 DIAGNOSIS — O0990 Supervision of high risk pregnancy, unspecified, unspecified trimester: Secondary | ICD-10-CM | POA: Diagnosis present

## 2019-07-03 DIAGNOSIS — O10919 Unspecified pre-existing hypertension complicating pregnancy, unspecified trimester: Secondary | ICD-10-CM

## 2019-07-03 DIAGNOSIS — O99213 Obesity complicating pregnancy, third trimester: Secondary | ICD-10-CM

## 2019-07-03 DIAGNOSIS — D279 Benign neoplasm of unspecified ovary: Secondary | ICD-10-CM

## 2019-07-03 DIAGNOSIS — O24415 Gestational diabetes mellitus in pregnancy, controlled by oral hypoglycemic drugs: Secondary | ICD-10-CM

## 2019-07-03 DIAGNOSIS — O09893 Supervision of other high risk pregnancies, third trimester: Secondary | ICD-10-CM

## 2019-07-03 DIAGNOSIS — O09899 Supervision of other high risk pregnancies, unspecified trimester: Secondary | ICD-10-CM | POA: Insufficient documentation

## 2019-07-03 DIAGNOSIS — O09293 Supervision of pregnancy with other poor reproductive or obstetric history, third trimester: Secondary | ICD-10-CM

## 2019-07-03 DIAGNOSIS — O3483 Maternal care for other abnormalities of pelvic organs, third trimester: Secondary | ICD-10-CM

## 2019-07-03 DIAGNOSIS — Z3A32 32 weeks gestation of pregnancy: Secondary | ICD-10-CM

## 2019-07-06 ENCOUNTER — Other Ambulatory Visit (HOSPITAL_COMMUNITY): Payer: Self-pay | Admitting: *Deleted

## 2019-07-06 DIAGNOSIS — O24415 Gestational diabetes mellitus in pregnancy, controlled by oral hypoglycemic drugs: Secondary | ICD-10-CM

## 2019-07-07 MED ORDER — OMEPRAZOLE MAGNESIUM 20 MG PO TBEC: 20.0000 mg | DELAYED_RELEASE_TABLET | Freq: Every day | ORAL | 3 refills | Status: DC

## 2019-07-07 NOTE — Addendum Note (Signed)
Addended by: Donnamae Jude on: 07/07/2019 10:30 AM   Modules accepted: Orders

## 2019-07-08 ENCOUNTER — Encounter (HOSPITAL_COMMUNITY): Payer: Self-pay

## 2019-07-08 ENCOUNTER — Encounter: Payer: BC Managed Care – PPO | Admitting: Family Medicine

## 2019-07-08 ENCOUNTER — Other Ambulatory Visit: Payer: Self-pay

## 2019-07-08 ENCOUNTER — Ambulatory Visit (HOSPITAL_COMMUNITY)
Admission: RE | Admit: 2019-07-08 | Discharge: 2019-07-08 | Disposition: A | Discharge status: Home / Self Care | Payer: BC Managed Care – PPO | Source: Ambulatory Visit | Attending: Obstetrics and Gynecology | Admitting: Obstetrics and Gynecology

## 2019-07-08 ENCOUNTER — Telehealth (INDEPENDENT_AMBULATORY_CARE_PROVIDER_SITE_OTHER): Payer: BC Managed Care – PPO | Admitting: Family Medicine

## 2019-07-08 ENCOUNTER — Ambulatory Visit (HOSPITAL_COMMUNITY): Payer: BC Managed Care – PPO | Admitting: *Deleted

## 2019-07-08 ENCOUNTER — Other Ambulatory Visit (HOSPITAL_COMMUNITY): Payer: Self-pay | Admitting: *Deleted

## 2019-07-08 VITALS — Wt 329.0 lb

## 2019-07-08 DIAGNOSIS — Z3A33 33 weeks gestation of pregnancy: Secondary | ICD-10-CM

## 2019-07-08 DIAGNOSIS — O10919 Unspecified pre-existing hypertension complicating pregnancy, unspecified trimester: Secondary | ICD-10-CM

## 2019-07-08 DIAGNOSIS — O10913 Unspecified pre-existing hypertension complicating pregnancy, third trimester: Secondary | ICD-10-CM

## 2019-07-08 DIAGNOSIS — O09899 Supervision of other high risk pregnancies, unspecified trimester: Secondary | ICD-10-CM

## 2019-07-08 DIAGNOSIS — O0990 Supervision of high risk pregnancy, unspecified, unspecified trimester: Secondary | ICD-10-CM | POA: Insufficient documentation

## 2019-07-08 DIAGNOSIS — O09293 Supervision of pregnancy with other poor reproductive or obstetric history, third trimester: Secondary | ICD-10-CM | POA: Diagnosis not present

## 2019-07-08 DIAGNOSIS — O10013 Pre-existing essential hypertension complicating pregnancy, third trimester: Secondary | ICD-10-CM | POA: Diagnosis not present

## 2019-07-08 DIAGNOSIS — F32A Depression, unspecified: Secondary | ICD-10-CM

## 2019-07-08 DIAGNOSIS — O0993 Supervision of high risk pregnancy, unspecified, third trimester: Secondary | ICD-10-CM

## 2019-07-08 DIAGNOSIS — O09893 Supervision of other high risk pregnancies, third trimester: Secondary | ICD-10-CM

## 2019-07-08 DIAGNOSIS — O24415 Gestational diabetes mellitus in pregnancy, controlled by oral hypoglycemic drugs: Secondary | ICD-10-CM

## 2019-07-08 DIAGNOSIS — O3483 Maternal care for other abnormalities of pelvic organs, third trimester: Secondary | ICD-10-CM

## 2019-07-08 DIAGNOSIS — O99343 Other mental disorders complicating pregnancy, third trimester: Secondary | ICD-10-CM

## 2019-07-08 DIAGNOSIS — F419 Anxiety disorder, unspecified: Secondary | ICD-10-CM

## 2019-07-08 DIAGNOSIS — D279 Benign neoplasm of unspecified ovary: Secondary | ICD-10-CM

## 2019-07-08 DIAGNOSIS — O99213 Obesity complicating pregnancy, third trimester: Secondary | ICD-10-CM

## 2019-07-08 DIAGNOSIS — F329 Major depressive disorder, single episode, unspecified: Secondary | ICD-10-CM

## 2019-07-08 MED ORDER — METFORMIN HCL 1000 MG PO TABS: ORAL_TABLET | ORAL | 2 refills | Status: DC

## 2019-07-08 MED ORDER — SERTRALINE HCL 25 MG PO TABS: 25.0000 mg | ORAL_TABLET | Freq: Every day | ORAL | 3 refills | Status: DC

## 2019-07-08 NOTE — Progress Notes (Signed)
I connected with  Latoya Mora on 07/08/19 at  2:00 PM EST by telephone and verified that I am speaking with the correct person using two identifiers.   I discussed the limitations, risks, security and privacy concerns of performing an evaluation and management service by telephone and the availability of in person appointments. I also discussed with the patient that there may be a patient responsible charge related to this service. The patient expressed understanding and agreed to proceed.  [name], [place] 07/08/2019  2:01 PM

## 2019-07-08 NOTE — Progress Notes (Signed)
I connected with Governor Rooks on 07/08/19 at  2:00 PM EST by telephoneand verified that I am speaking with the correct person using two identifiers.  I discussed the limitations, risks, security and privacy concerns of performing an evaluation and management service by telephone and the availability of in person appointments. I also discussed with the patient that there may be a patient responsible charge related to this service. The patient expressed understanding and agreed to proceed.  Staff involved: Demetrice Phillip Heal    PRENATAL VISIT NOTE  Subjective:  Latoya Mora is a 24 y.o. G2P1001 at [redacted]w[redacted]d being seen today for ongoing prenatal care.  She is currently monitored for the following issues for this high-risk pregnancy and has Seasonal allergies; Anxiety and depression; Frequent headaches; Obesity, Class III, BMI 40-49.9 (morbid obesity) ([place]); PCOS (polycystic ovarian syndrome); Supervision of high risk pregnancy, antepartum; Dermoid cyst of left ovary; Chronic hypertension complicating pregnancy, antepartum; Short interval between pregnancies affecting pregnancy, antepartum; Stress incontinence during pregnancy; and Gestational diabetes mellitus, antepartum on their problem list.  Patient reports no complaints.  Contractions: Irregular. Vag. Bleeding: None.  Movement: Present. Denies leaking of fluid.   See blood sugar logs.  The following portions of the patient's history were reviewed and updated as appropriate: allergies, current medications, past family history, past medical history, past social history, past surgical history and problem list.   Objective:   Vitals:   07/08/19 1400  Weight: (!) 329 lb (149.2 kg)    Fetal Status:     Movement: Present     General:  Alert, oriented and cooperative. Patient is in no acute distress.  Skin: Skin is warm and dry. No rash noted.   Cardiovascular: Normal heart rate noted  Respiratory: Normal respiratory effort, no problems with  respiration noted  Abdomen: Soft, gravid, appropriate for gestational age.  Pain/Pressure: Present     Pelvic: Cervical exam deferred        Extremities: Normal range of motion.  Edema: None  Mental Status: Normal mood and affect. Normal behavior. Normal judgment and thought content.   Assessment and Plan:  Pregnancy: G2P1001 at [redacted]w[redacted]d  1. Short interval between pregnancies affecting pregnancy, antepartum  2. Supervision of high risk pregnancy, antepartum Up to date TWG is at goal-3 lb (-1.361 kg)   3. Chronic hypertension complicating pregnancy, antepartum BP at baseline (DBP in the 90s)  4. Gestational diabetes mellitus (GDM) controlled on oral hypoglycemic drug, antepartum FBG > 95 100% of reviewed values, typically >100. Has the occasional pp > 120. Currently on metformin 500 qhs. Will increase to 1000mg  QHS Diet recall appropriate and patient has tried nightly walking which has not improved sugars.  EFW= 99th% at 32 wk, has repeat US 2/5. Was told she might be offered primary CS if > 4250g Counseled on likely start of insulin if metformin does not control glucose levels. Voices understanding. Will have patient schedule 1 wk virtual to review sugars.   5. Anxiety/Insomnia Reports trying sleep hygiene and OTC sleep aids.  On interview she is having anxiety sx and is currently unmedicated, prior medication was Effexor with patient stopped during this pregnancy. Reviewed that her insomnia and uncontrolled anxiety are likely related. She reports anxious thoughts at night that keep her awake and worrying at night.  Reviewed safety profile of zoloft in pregnancy. She is willing to try medication.  - Start Zoloft 25mg  and increase in 1 week if no side effects present.  - Consider adding atarax for anxiety sx.  -  Encouraged use of unisom for sleep   Preterm labor symptoms and general obstetric precautions including but not limited to vaginal bleeding, contractions, leaking of fluid and  fetal movement were reviewed in detail with the patient. Please refer to After Visit Summary for other counseling recommendations.   Return in about 1 week (around 07/15/2019) for Routine prenatal care- check sugar log, Telehealth/Virtual health OB Visit.  Future Appointments  Date Time Provider [place]  07/17/2019  9:15 AM [place] MFC-US  07/17/2019  9:15 AM [place] 4 WH-MFCUS MFC-US  07/21/2019  1:15 PM Anyanwu, Sallyanne Havers, MD [facility]  07/23/2019  7:30 AM WH-MFC [place] 2 WH-MFCUS MFC-US  07/23/2019  7:40 AM WH-MFC NURSE WH-MFC MFC-US  07/31/2019  8:30 AM WH-MFC [place] 1 WH-MFCUS MFC-US  07/31/2019  8:45 AM WH-MFC NURSE WH-MFC MFC-US    Caren Macadam, MD

## 2019-07-17 ENCOUNTER — Other Ambulatory Visit: Payer: Self-pay

## 2019-07-17 ENCOUNTER — Encounter (HOSPITAL_COMMUNITY): Payer: Self-pay

## 2019-07-17 ENCOUNTER — Ambulatory Visit (HOSPITAL_COMMUNITY): Payer: BC Managed Care – PPO | Admitting: *Deleted

## 2019-07-17 ENCOUNTER — Ambulatory Visit (HOSPITAL_COMMUNITY)
Admission: RE | Admit: 2019-07-17 | Discharge: 2019-07-17 | Disposition: A | Discharge status: Home / Self Care | Payer: BC Managed Care – PPO | Source: Ambulatory Visit | Attending: Obstetrics and Gynecology | Admitting: Obstetrics and Gynecology

## 2019-07-17 DIAGNOSIS — Z3A34 34 weeks gestation of pregnancy: Secondary | ICD-10-CM | POA: Diagnosis not present

## 2019-07-17 DIAGNOSIS — O10013 Pre-existing essential hypertension complicating pregnancy, third trimester: Secondary | ICD-10-CM

## 2019-07-17 DIAGNOSIS — O09899 Supervision of other high risk pregnancies, unspecified trimester: Secondary | ICD-10-CM | POA: Insufficient documentation

## 2019-07-17 DIAGNOSIS — O0990 Supervision of high risk pregnancy, unspecified, unspecified trimester: Secondary | ICD-10-CM | POA: Insufficient documentation

## 2019-07-17 DIAGNOSIS — O24415 Gestational diabetes mellitus in pregnancy, controlled by oral hypoglycemic drugs: Secondary | ICD-10-CM | POA: Diagnosis present

## 2019-07-21 ENCOUNTER — Other Ambulatory Visit: Payer: Self-pay

## 2019-07-21 ENCOUNTER — Encounter (HOSPITAL_COMMUNITY): Payer: Self-pay | Admitting: Obstetrics & Gynecology

## 2019-07-21 ENCOUNTER — Inpatient Hospital Stay (HOSPITAL_COMMUNITY)
Admission: AD | Admit: 2019-07-21 | Discharge: 2019-07-24 | DRG: 833 | Disposition: A | Discharge status: Home / Self Care | Payer: Medicaid Other | Attending: Family Medicine | Admitting: Family Medicine

## 2019-07-21 ENCOUNTER — Telehealth: Payer: Self-pay | Admitting: *Deleted

## 2019-07-21 ENCOUNTER — Telehealth: Payer: BC Managed Care – PPO | Admitting: Obstetrics & Gynecology

## 2019-07-21 DIAGNOSIS — O26893 Other specified pregnancy related conditions, third trimester: Secondary | ICD-10-CM | POA: Diagnosis not present

## 2019-07-21 DIAGNOSIS — O99213 Obesity complicating pregnancy, third trimester: Secondary | ICD-10-CM | POA: Diagnosis present

## 2019-07-21 DIAGNOSIS — O24415 Gestational diabetes mellitus in pregnancy, controlled by oral hypoglycemic drugs: Secondary | ICD-10-CM | POA: Diagnosis present

## 2019-07-21 DIAGNOSIS — O10013 Pre-existing essential hypertension complicating pregnancy, third trimester: Secondary | ICD-10-CM | POA: Diagnosis present

## 2019-07-21 DIAGNOSIS — N83202 Unspecified ovarian cyst, left side: Secondary | ICD-10-CM | POA: Diagnosis present

## 2019-07-21 DIAGNOSIS — Z20822 Contact with and (suspected) exposure to covid-19: Secondary | ICD-10-CM | POA: Diagnosis present

## 2019-07-21 DIAGNOSIS — O10919 Unspecified pre-existing hypertension complicating pregnancy, unspecified trimester: Secondary | ICD-10-CM | POA: Diagnosis present

## 2019-07-21 DIAGNOSIS — R519 Headache, unspecified: Secondary | ICD-10-CM | POA: Diagnosis present

## 2019-07-21 DIAGNOSIS — O10913 Unspecified pre-existing hypertension complicating pregnancy, third trimester: Secondary | ICD-10-CM | POA: Diagnosis not present

## 2019-07-21 DIAGNOSIS — R7989 Other specified abnormal findings of blood chemistry: Secondary | ICD-10-CM | POA: Diagnosis present

## 2019-07-21 DIAGNOSIS — O3483 Maternal care for other abnormalities of pelvic organs, third trimester: Secondary | ICD-10-CM | POA: Diagnosis present

## 2019-07-21 DIAGNOSIS — O1413 Severe pre-eclampsia, third trimester: Secondary | ICD-10-CM | POA: Diagnosis not present

## 2019-07-21 DIAGNOSIS — O113 Pre-existing hypertension with pre-eclampsia, third trimester: Secondary | ICD-10-CM | POA: Diagnosis present

## 2019-07-21 DIAGNOSIS — Z3A35 35 weeks gestation of pregnancy: Secondary | ICD-10-CM | POA: Diagnosis not present

## 2019-07-21 LAB — CBC
HCT: 38.5 % (ref 36.0–46.0)
Hemoglobin: 11.6 g/dL — ABNORMAL LOW (ref 12.0–15.0)
MCH: 22.6 pg — ABNORMAL LOW (ref 26.0–34.0)
MCHC: 30.1 g/dL (ref 30.0–36.0)
MCV: 75 fL — ABNORMAL LOW (ref 80.0–100.0)
Platelets: 272 10*3/uL (ref 150–400)
RBC: 5.13 MIL/uL — ABNORMAL HIGH (ref 3.87–5.11)
RDW: 17.9 % — ABNORMAL HIGH (ref 11.5–15.5)
WBC: 9.4 10*3/uL (ref 4.0–10.5)
nRBC: 0 % (ref 0.0–0.2)

## 2019-07-21 LAB — OB RESULTS CONSOLE GBS: GBS: NEGATIVE

## 2019-07-21 LAB — COMPREHENSIVE METABOLIC PANEL
ALT: 116 U/L — ABNORMAL HIGH (ref 0–44)
AST: 49 U/L — ABNORMAL HIGH (ref 15–41)
Albumin: 2.5 g/dL — ABNORMAL LOW (ref 3.5–5.0)
Alkaline Phosphatase: 283 U/L — ABNORMAL HIGH (ref 38–126)
Anion gap: 8 (ref 5–15)
BUN: 6 mg/dL (ref 6–20)
CO2: 19 mmol/L — ABNORMAL LOW (ref 22–32)
Calcium: 8.7 mg/dL — ABNORMAL LOW (ref 8.9–10.3)
Chloride: 108 mmol/L (ref 98–111)
Creatinine, Ser: 0.47 mg/dL (ref 0.44–1.00)
GFR calc Af Amer: >60 mL/min (ref >60)
GFR calc non Af Amer: >60 mL/min (ref >60)
Glucose, Bld: 83 mg/dL (ref 70–99)
Potassium: 3.9 mmol/L (ref 3.5–5.1)
Sodium: 135 mmol/L (ref 135–145)
Total Bilirubin: 0.6 mg/dL (ref 0.3–1.2)
Total Protein: 6.8 g/dL (ref 6.5–8.1)

## 2019-07-21 LAB — URINALYSIS, ROUTINE W REFLEX MICROSCOPIC
Bilirubin Urine: NEGATIVE
Glucose, UA: NEGATIVE mg/dL
Hgb urine dipstick: NEGATIVE
Ketones, ur: NEGATIVE mg/dL
Nitrite: NEGATIVE
Protein, ur: 30 mg/dL — AB
Specific Gravity, Urine: 1.025 (ref 1.005–1.030)
pH: 5 (ref 5.0–8.0)

## 2019-07-21 LAB — PROTEIN / CREATININE RATIO, URINE
Creatinine, Urine: 209.16 mg/dL
Protein Creatinine Ratio: 0.1 mg/mg{Cre} (ref 0.00–0.15)
Total Protein, Urine: 21 mg/dL

## 2019-07-21 LAB — GLUCOSE, CAPILLARY: Glucose-Capillary: 95 mg/dL (ref 70–99)

## 2019-07-21 LAB — SARS CORONAVIRUS 2 (TAT 6-24 HRS): SARS Coronavirus 2: NEGATIVE

## 2019-07-21 MED ORDER — DIPHENHYDRAMINE HCL 25 MG PO CAPS
25.0000 mg | ORAL_CAPSULE | Freq: Once | ORAL | Status: AC
  Administered 2019-07-21: 25 mg via ORAL
  Filled 2019-07-21: qty 1

## 2019-07-21 MED ORDER — ASPIRIN EC 81 MG PO TBEC
162.0000 mg | DELAYED_RELEASE_TABLET | Freq: Every day | ORAL | Status: DC
  Administered 2019-07-22 – 2019-07-24 (×3): 162 mg via ORAL
  Filled 2019-07-21 (×3): qty 2

## 2019-07-21 MED ORDER — INSULIN ASPART 100 UNIT/ML ~~LOC~~ SOLN
0.0000 [IU] | Freq: Three times a day (TID) | SUBCUTANEOUS | Status: DC
  Administered 2019-07-21 – 2019-07-22 (×2): 2 [IU] via SUBCUTANEOUS
  Administered 2019-07-22: 3 [IU] via SUBCUTANEOUS
  Administered 2019-07-23 (×2): 2 [IU] via SUBCUTANEOUS

## 2019-07-21 MED ORDER — ZOLPIDEM TARTRATE 5 MG PO TABS
5.0000 mg | ORAL_TABLET | Freq: Every evening | ORAL | Status: DC | PRN
  Administered 2019-07-21 – 2019-07-23 (×3): 5 mg via ORAL
  Filled 2019-07-21 (×3): qty 1

## 2019-07-21 MED ORDER — METOCLOPRAMIDE HCL 10 MG PO TABS
10.0000 mg | ORAL_TABLET | Freq: Once | ORAL | Status: AC
  Administered 2019-07-21: 10 mg via ORAL
  Filled 2019-07-21: qty 1

## 2019-07-21 MED ORDER — PRENATAL MULTIVITAMIN CH: 1.0000 | ORAL_TABLET | Freq: Every day | ORAL | Status: DC

## 2019-07-21 MED ORDER — PANTOPRAZOLE SODIUM 40 MG PO TBEC
40.0000 mg | DELAYED_RELEASE_TABLET | Freq: Every day | ORAL | Status: DC
  Administered 2019-07-22 – 2019-07-24 (×3): 40 mg via ORAL
  Filled 2019-07-21 (×3): qty 1

## 2019-07-21 MED ORDER — CALCIUM CARBONATE ANTACID 500 MG PO CHEW: 2.0000 | CHEWABLE_TABLET | ORAL | Status: DC | PRN

## 2019-07-21 MED ORDER — DOCUSATE SODIUM 100 MG PO CAPS
100.0000 mg | ORAL_CAPSULE | Freq: Every day | ORAL | Status: DC
  Administered 2019-07-22 – 2019-07-24 (×3): 100 mg via ORAL
  Filled 2019-07-21 (×3): qty 1

## 2019-07-21 MED ORDER — BETAMETHASONE SOD PHOS & ACET 6 (3-3) MG/ML IJ SUSP
12.0000 mg | INTRAMUSCULAR | Status: AC
  Administered 2019-07-21 – 2019-07-22 (×2): 12 mg via INTRAMUSCULAR
  Filled 2019-07-21 (×2): qty 5

## 2019-07-21 MED ORDER — ACETAMINOPHEN 325 MG PO TABS
650.0000 mg | ORAL_TABLET | ORAL | Status: DC | PRN
  Administered 2019-07-21 – 2019-07-22 (×2): 650 mg via ORAL
  Filled 2019-07-21 (×2): qty 2

## 2019-07-21 MED ORDER — PRENATAL MULTIVITAMIN CH
1.0000 | ORAL_TABLET | Freq: Every day | ORAL | Status: DC
  Administered 2019-07-22: 1 via ORAL
  Filled 2019-07-21: qty 1

## 2019-07-21 MED ORDER — METFORMIN HCL 500 MG PO TABS
1000.0000 mg | ORAL_TABLET | Freq: Every day | ORAL | Status: DC
  Administered 2019-07-21 – 2019-07-23 (×3): 1000 mg via ORAL
  Filled 2019-07-21 (×3): qty 2

## 2019-07-21 MED ORDER — LABETALOL HCL 200 MG PO TABS
200.0000 mg | ORAL_TABLET | Freq: Two times a day (BID) | ORAL | Status: DC
  Administered 2019-07-21 – 2019-07-22 (×3): 200 mg via ORAL
  Filled 2019-07-21 (×3): qty 1

## 2019-07-21 NOTE — H&P (Addendum)
[name] is a 24 y.o. G2P1001 at [redacted]w[redacted]d who presents to [facility]U for preeclampsia evaluation after she took her BP at home after she started getting a bad headache that wasn't going away and she started seeing spots. Pt reports highest at home was 151/105, but was mostly in 140s/90s. Pt reports the HA started around 9AM this morning, which she took Tylenol around 11AM (2 extra strength), which did not help at all. Pt reports her HA is currently 5/10 at worst. Pt denies history of HA except with taking Effexor. Pt reports she has been seeing "flickers" of light like gnats, denies this currently, but pt reports it has happened in the past 2 hours.  Pt denies blurry vision, N/V, epigastric pain, swelling in face and hands, sudden weight gain. Pt denies chest pain and SOB.  Pt denies constipation, diarrhea, or urinary problems. Pt denies fever, chills, fatigue, sweating or changes in appetite. Pt denies dizziness, light-headedness, weakness.  Pt denies VB, ctx, LOF and reports good FM.  Current pregnancy problems? hx of preeclampsia with previous pregnancy Blood Type? B Positive Allergies? NSAIDs, Flexeril, Prozac, Etodolac, Meloxicam Current medications? ASA 81mg , labetalol 200mg  BID (took this morning as prescribed), metformin 1000mg  QHS, prilosec Current PNC & next appt? [facility], no next appt, was supposed to have appt at 115pm today  OB History    Gravida  2   Para  1   Term  1   Preterm  0   AB  0   Living  1     SAB  0   TAB  0   Ectopic  0   Multiple  0   Live Births  1          Past Medical History:  Diagnosis Date  . Allergy   . Anemia in pregnancy 02/11/2019   CBC Latest Ref Rng & Units 06/09/2019 05/23/2019 05/03/2019 WBC 3.4 - 10.8 x10E3/uL 11.4(H) 11.5(H) 11.8(H) Hemoglobin 11.1 - 15.9 g/dL 11.4 11.2(L) 11.5(L) Hematocrit 34.0 - 46.6 % 37.0 36.1 37.0 Platelets 150 - 450 x10E3/uL 262 279 283    . Anxiety   . Depression   . Dermoid cyst of left  ovary 11/04/2017  . Elevated liver enzymes   . Frequent headaches   . Gestational diabetes   . History of pre-eclampsia 06/26/2018  . Hypertension   . Infection    UTI  . PCOS (polycystic ovarian syndrome)    Past Surgical History:  Procedure Laterality Date  . TOOTH EXTRACTION     Family History: family history includes Alcohol abuse in her father; Anxiety disorder in her mother; Arthritis in her paternal grandmother; Bipolar disorder in her father and paternal grandfather; COPD in her maternal grandfather and maternal grandmother; Depression in her father and mother; Diabetes in her paternal grandmother; Heart disease in her mother; Osteochondroma in her brother, father, and sister; Parkinson's disease in her maternal grandfather; Scoliosis in her maternal grandmother and mother; Skin cancer in her maternal grandfather and mother. Social History:  reports that she has never smoked. She has never used smokeless tobacco. She reports previous alcohol use. She reports that she does not use drugs.    Maternal Diabetes: Yes:  Diabetes Type:  Insulin/Medication controlled - GDM, Metformin 1000mg  QHS Genetic Screening: Normal NIPS Maternal Ultrasounds/Referrals: Other: EFW 99% Fetal Ultrasounds or other Referrals:  None Maternal Substance Abuse:  No Significant Maternal Medications:  Meds include: Other: labetalol 200mg  BID, Metformin 1000mg  BID, baby ASA, prilosec Significant Maternal Lab Results:  Other: GDM, elevated liver enzymes Other Comments:  dermoid cyst on left ovary, stress incontinence, short interval between pregnancies, cHTN, PCOS, obesity, frequent headaches, anxiety/depression  Review of Systems  Constitutional: Negative for chills, diaphoresis, fatigue and fever.  Eyes: Positive for visual disturbance.  Respiratory: Negative for shortness of breath.   Cardiovascular: Negative for chest pain.  Gastrointestinal: Negative for abdominal pain, constipation, diarrhea, nausea and  vomiting.  Genitourinary: Negative for dysuria, flank pain, frequency, pelvic pain, urgency, vaginal bleeding and vaginal discharge.  Neurological: Positive for headaches. Negative for dizziness, weakness and light-headedness.   Dilation: 1 Effacement (%): Thick Exam by:: Aprille Sawhney Blood pressure 128/77, pulse (!) 102, temperature 98.7 F (37.1 C), temperature source Oral, resp. rate 18, height 5\' 8"  (1.727 m), weight (!) 148.7 kg, last menstrual period 11/14/2018, SpO2 100 %, unknown if currently breastfeeding.   Patient Vitals for the past 24 hrs:  BP Temp Temp src Pulse Resp SpO2 Height Weight  07/21/19 1739 128/77 98.7 F (37.1 C) Oral (!) 102 18 -- -- --  07/21/19 1601 115/70 -- -- (!) 102 -- -- -- --  07/21/19 1546 126/65 -- -- 92 -- -- -- --  07/21/19 1531 127/76 -- -- 93 -- -- -- --  07/21/19 1516 125/76 -- -- 93 -- -- -- --  07/21/19 1515 -- -- -- -- -- 100 % -- --  07/21/19 1505 -- -- -- -- -- 99 % -- --  07/21/19 1501 122/68 -- -- 89 -- -- -- --  07/21/19 1448 132/85 -- -- 94 -- -- -- --  07/21/19 1406 131/79 98.3 F (36.8 C) Oral 94 18 100 % 5\' 8"  (1.727 m) (!) 148.7 kg   Maternal Exam:  Introitus: Vagina is negative for discharge.    Physical Exam  Constitutional: She is oriented to person, place, and time. She appears well-developed and well-nourished. No distress.  HENT:  Head: Normocephalic and atraumatic.  Respiratory: Effort normal.  GI: Soft.  Genitourinary:    No vaginal discharge.   Neurological: She is alert and oriented to person, place, and time.  Skin: Skin is warm and dry. She is not diaphoretic.  Psychiatric: She has a normal mood and affect. Her behavior is normal. Judgment and thought content normal.    Prenatal labs: ABO, Rh: B/Positive/-- (08/17 1408) Antibody: Negative (08/17 1408) Rubella: 1.01 (08/17 1408) RPR: Non Reactive (12/15 0813)  HBsAg: Negative (08/17 1408)  HIV: Non Reactive (12/15 0813)  GBS:   not yet  done  Assessment/Plan: -HA resolved with meds, except when moving -admit to [facility] for observation -will order steroids, betamethasone, monitor BP, repeat labs in AM  Swannie Milius E Tiondra Fang 07/21/2019, 5:43 PM

## 2019-07-21 NOTE — Plan of Care (Signed)
  Problem: Health Behavior/Discharge Planning: Goal: Ability to manage health-related needs will improve Outcome: Completed/Met   Problem: Health Behavior/Discharge Planning: Goal: Ability to identify and utilize available resources and services will improve Outcome: Progressing

## 2019-07-21 NOTE — MAU Provider Note (Signed)
EFM: reactive       -baseline: 140       -variability: moderate       -accels: present, 15x15       -decels: absent       -TOCO: frequent, irregular ctx, pt not feeling  Cypress Fanfan, Gerrie Nordmann, NP  5:41 PM 07/21/2019

## 2019-07-21 NOTE — MAU Note (Signed)
Started feeling kind of crappy this morning.  Took Labetalol. Started getting a headache- Tylenol did not help, was seeing spots. Took BP and placed in 'babyscripts'.  Was called and told to come in .  Having a little bit of pelvic pressure, some braxton hicks, not new.

## 2019-07-21 NOTE — Telephone Encounter (Signed)
Pt called stating she is not feeling well today and she has a a headache that tylenol has not relieved and having spots in her vision. She did take her Bp and they were 124/94, 140/93 and pt did take her BP meds this AM. Per Dr Harolyn Rutherford pt needs to go to for evaluation. Pt verbalizes and understands, will cancel virtual appointment for this afternoon.

## 2019-07-22 DIAGNOSIS — O1413 Severe pre-eclampsia, third trimester: Secondary | ICD-10-CM

## 2019-07-22 DIAGNOSIS — R7989 Other specified abnormal findings of blood chemistry: Secondary | ICD-10-CM | POA: Diagnosis present

## 2019-07-22 LAB — CBC
HCT: 43.8 % (ref 36.0–46.0)
Hemoglobin: 13.2 g/dL (ref 12.0–15.0)
MCH: 22.6 pg — ABNORMAL LOW (ref 26.0–34.0)
MCHC: 30.1 g/dL (ref 30.0–36.0)
MCV: 75.1 fL — ABNORMAL LOW (ref 80.0–100.0)
Platelets: 272 10*3/uL (ref 150–400)
RBC: 5.83 MIL/uL — ABNORMAL HIGH (ref 3.87–5.11)
RDW: 18.7 % — ABNORMAL HIGH (ref 11.5–15.5)
WBC: 6.9 10*3/uL (ref 4.0–10.5)
nRBC: 0 % (ref 0.0–0.2)

## 2019-07-22 LAB — COMPREHENSIVE METABOLIC PANEL
ALT: 126 U/L — ABNORMAL HIGH (ref 0–44)
AST: 63 U/L — ABNORMAL HIGH (ref 15–41)
Albumin: 2.6 g/dL — ABNORMAL LOW (ref 3.5–5.0)
Alkaline Phosphatase: 335 U/L — ABNORMAL HIGH (ref 38–126)
Anion gap: 11 (ref 5–15)
BUN: <5 mg/dL — ABNORMAL LOW (ref 6–20)
CO2: 18 mmol/L — ABNORMAL LOW (ref 22–32)
Calcium: 9 mg/dL (ref 8.9–10.3)
Chloride: 106 mmol/L (ref 98–111)
Creatinine, Ser: 0.46 mg/dL (ref 0.44–1.00)
GFR calc Af Amer: >60 mL/min (ref >60)
GFR calc non Af Amer: >60 mL/min (ref >60)
Glucose, Bld: 106 mg/dL — ABNORMAL HIGH (ref 70–99)
Potassium: 4 mmol/L (ref 3.5–5.1)
Sodium: 135 mmol/L (ref 135–145)
Total Bilirubin: 0.4 mg/dL (ref 0.3–1.2)
Total Protein: 7.1 g/dL (ref 6.5–8.1)

## 2019-07-22 LAB — CULTURE, OB URINE: Culture: <10000 — AB

## 2019-07-22 LAB — TYPE AND SCREEN
ABO/RH(D): B POS
Antibody Screen: NEGATIVE

## 2019-07-22 LAB — GLUCOSE, CAPILLARY
Glucose-Capillary: 96 mg/dL (ref 70–99)
Glucose-Capillary: 79 mg/dL (ref 70–99)
Glucose-Capillary: 87 mg/dL (ref 70–99)
Glucose-Capillary: 136 mg/dL — ABNORMAL HIGH (ref 70–99)

## 2019-07-22 LAB — ABO/RH: ABO/RH(D): B POS

## 2019-07-22 NOTE — Progress Notes (Signed)
Dr. Roselie Awkward at bedside to evaluate patient.  May remove from monitor and continue to observe.

## 2019-07-22 NOTE — Progress Notes (Signed)
Patient ID: Latoya Mora, female   DOB: April 01, 1996, 24 y.o.   MRN: [id number] [place]) NOTE  Latoya Mora is a 24 y.o. G2P1001 at [redacted]w[redacted]d by best clinical estimate who is admitted for elevated BP and LFT.   Fetal presentation is cephalic. Length of Stay:  1  Days  Subjective: Brown spotting Patient reports the fetal movement as active. Patient reports uterine contraction  activity as none. Patient reports  vaginal bleeding as none Patient describes fluid per vagina as None.  Vitals:  Blood pressure 139/80, pulse 97, temperature 98.3 F (36.8 C), temperature source Oral, resp. rate 18, height 5\' 8"  (1.727 m), weight (!) 148.7 kg, last menstrual period 11/14/2018, SpO2 100 %, unknown if currently breastfeeding. Physical Examination:  General appearance - alert, well appearing, and in no distress Heart - normal rate and regular rhythm Abdomen - soft, nontender, nondistended Fundal Height:  size equals dates Cervical Exam: Not evaluated. Extremities: extremities normal, atraumatic, no cyanosis or edema and Homans sign is negative, no sign of DVT Membranes:intact  Fetal Monitoring:   Fetal Heart Rate A  Mode External filed at 07/22/2019 [id number]  Baseline Rate (A) 140 bpm filed at 07/22/2019 [id number]  Variability 6-25 BPM filed at 07/22/2019 0916  Accelerations 15 x 15 filed at 07/22/2019 [id number]  Decelerations None filed at 07/22/2019 [id number]     Labs:  Results for orders placed or performed during the hospital encounter of 07/21/19 (from the past 24 hour(s))  Urinalysis, Routine w reflex microscopic   Collection Time: 07/21/19  2:40 PM  Result Value Ref Range   Color, Urine AMBER (A) YELLOW   APPearance HAZY (A) CLEAR   Specific Gravity, Urine 1.025 1.005 - 1.030   pH 5.0 5.0 - 8.0   Glucose, UA NEGATIVE NEGATIVE mg/dL   Hgb urine dipstick NEGATIVE NEGATIVE   Bilirubin Urine NEGATIVE NEGATIVE   Ketones, ur NEGATIVE NEGATIVE mg/dL   Protein, ur 30 (A)  NEGATIVE mg/dL   Nitrite NEGATIVE NEGATIVE   Leukocytes,Ua SMALL (A) NEGATIVE   RBC / HPF 0-5 0 - 5 RBC/hpf   WBC, UA 6-10 0 - 5 WBC/hpf   Bacteria, UA RARE (A) NONE SEEN   Squamous Epithelial / LPF 0-5 0 - 5   Mucus PRESENT   Protein / creatinine ratio, urine   Collection Time: 07/21/19  3:31 PM  Result Value Ref Range   Creatinine, Urine 209.16 mg/dL   Total Protein, Urine 21 mg/dL   Protein Creatinine Ratio 0.10 0.00 - 0.15 mg/mg[Cre]  CBC   Collection Time: 07/21/19  3:38 PM  Result Value Ref Range   WBC 9.4 4.0 - 10.5 K/uL   RBC 5.13 (H) 3.87 - 5.11 MIL/uL   Hemoglobin 11.6 (L) 12.0 - 15.0 g/dL   HCT 38.5 36.0 - 46.0 %   MCV 75.0 (L) 80.0 - 100.0 fL   MCH 22.6 (L) 26.0 - 34.0 pg   MCHC 30.1 30.0 - 36.0 g/dL   RDW 17.9 (H) 11.5 - 15.5 %   Platelets 272 150 - 400 K/uL   nRBC 0.0 0.0 - 0.2 %  Comprehensive metabolic panel   Collection Time: 07/21/19  3:38 PM  Result Value Ref Range   Sodium 135 135 - 145 mmol/L   Potassium 3.9 3.5 - 5.1 mmol/L   Chloride 108 98 - 111 mmol/L   CO2 19 (L) 22 - 32 mmol/L   Glucose, Bld 83 70 - 99 mg/dL   BUN 6 6 - 20 mg/dL  Creatinine, Ser 0.47 0.44 - 1.00 mg/dL   Calcium 8.7 (L) 8.9 - 10.3 mg/dL   Total Protein 6.8 6.5 - 8.1 g/dL   Albumin 2.5 (L) 3.5 - 5.0 g/dL   AST 49 (H) 15 - 41 U/L   ALT 116 (H) 0 - 44 U/L   Alkaline Phosphatase 283 (H) 38 - 126 U/L   Total Bilirubin 0.6 0.3 - 1.2 mg/dL   GFR calc non Af Amer >60 >60 mL/min   GFR calc Af Amer >60 >60 mL/min   Anion gap 8 5 - 15  Culture, OB Urine   Collection Time: 07/21/19  4:00 PM   Specimen: Urine, Random  Result Value Ref Range   Specimen Description URINE, RANDOM    Special Requests NONE    Culture (A)     <10,000 COLONIES/mL INSIGNIFICANT GROWTH NO GROUP B STREP (S.AGALACTIAE) ISOLATED Performed at [facility], [address], [place], [place] [address]    Report Status 07/22/2019 FINAL   SARS CORONAVIRUS 2 (TAT 6-24 HRS) Nasopharyngeal Nasopharyngeal Swab    Collection Time: 07/21/19  5:19 PM   Specimen: Nasopharyngeal Swab  Result Value Ref Range   SARS Coronavirus 2 NEGATIVE NEGATIVE  Glucose, capillary   Collection Time: 07/21/19  9:03 PM  Result Value Ref Range   Glucose-Capillary 95 70 - 99 mg/dL  CBC   Collection Time: 07/22/19  5:37 AM  Result Value Ref Range   WBC 6.9 4.0 - 10.5 K/uL   RBC 5.83 (H) 3.87 - 5.11 MIL/uL   Hemoglobin 13.2 12.0 - 15.0 g/dL   HCT 43.8 36.0 - 46.0 %   MCV 75.1 (L) 80.0 - 100.0 fL   MCH 22.6 (L) 26.0 - 34.0 pg   MCHC 30.1 30.0 - 36.0 g/dL   RDW 18.7 (H) 11.5 - 15.5 %   Platelets 272 150 - 400 K/uL   nRBC 0.0 0.0 - 0.2 %  Comprehensive metabolic panel   Collection Time: 07/22/19  5:37 AM  Result Value Ref Range   Sodium 135 135 - 145 mmol/L   Potassium 4.0 3.5 - 5.1 mmol/L   Chloride 106 98 - 111 mmol/L   CO2 18 (L) 22 - 32 mmol/L   Glucose, Bld 106 (H) 70 - 99 mg/dL   BUN <5 (L) 6 - 20 mg/dL   Creatinine, Ser 0.46 0.44 - 1.00 mg/dL   Calcium 9.0 8.9 - 10.3 mg/dL   Total Protein 7.1 6.5 - 8.1 g/dL   Albumin 2.6 (L) 3.5 - 5.0 g/dL   AST 63 (H) 15 - 41 U/L   ALT 126 (H) 0 - 44 U/L   Alkaline Phosphatase 335 (H) 38 - 126 U/L   Total Bilirubin 0.4 0.3 - 1.2 mg/dL   GFR calc non Af Amer >60 >60 mL/min   GFR calc Af Amer >60 >60 mL/min   Anion gap 11 5 - 15  Type and screen   Collection Time: 07/22/19  5:37 AM  Result Value Ref Range   ABO/RH(D) B POS    Antibody Screen NEG    Sample Expiration      07/25/2019,2359 Performed at [facility], [address] [place], [place] [address]   ABO/Rh   Collection Time: 07/22/19  5:37 AM  Result Value Ref Range   ABO/RH(D)      B POS Performed at [facility] [address], [place], [place] [address]   Glucose, capillary   Collection Time: 07/22/19  6:31 AM  Result Value Ref Range   Glucose-Capillary 96 70 - 99 mg/dL     Medications:  Scheduled . aspirin EC  162 mg Oral Daily  . betamethasone acetate-betamethasone sodium  phosphate  12 mg Intramuscular Q24H  . docusate sodium  100 mg Oral Daily  . insulin aspart  0-16 Units Subcutaneous TID PC  . labetalol  200 mg Oral BID  . metFORMIN  1,000 mg Oral QHS  . pantoprazole  40 mg Oral Daily  . prenatal multivitamin  1 tablet Oral Q1200   I have reviewed the patient's current medications.  ASSESSMENT: Patient Active Problem List   Diagnosis Date Noted  . Preeclampsia, severe 07/22/2019  . Gestational diabetes mellitus (GDM) controlled on oral hypoglycemic drug 06/11/2019  . Stress incontinence during pregnancy 04/21/2019  . Short interval between pregnancies affecting pregnancy, antepartum 02/11/2019  . Chronic hypertension complicating pregnancy, antepartum 04/24/2018  . Supervision of high risk pregnancy, antepartum 11/04/2017  . Dermoid cyst of left ovary 11/04/2017  . PCOS (polycystic ovarian syndrome) 10/03/2017  . Obesity, Class III, BMI 40-49.9 (morbid obesity) ([place]) 04/03/2017  . Seasonal allergies 09/18/2016  . Anxiety and depression 09/18/2016  . Frequent headaches 09/18/2016    PLAN: Repeat LFT tomorrow Follow for increased BP   Emeterio Reeve 07/22/2019,10:36 AM

## 2019-07-22 NOTE — Progress Notes (Signed)
Inpatient Diabetes Program Recommendations  AACE/ADA: New Consensus Statement on Inpatient Glycemic Control (2015)  Target Ranges:  Prepandial:   less than 140 mg/dL      Peak postprandial:   less than 180 mg/dL (1-2 hours)      Critically ill patients:  140 - 180 mg/dL   Lab Results  Component Value Date   GLUCAP 87 07/22/2019   HGBA1C 5.2 02/27/2019    Glucose trends look good even while in setting of steroids.   Received call regarding concern for CHO intake per grams for snacks and meals. Dr Roselie Awkward provided order for snacks. Modified for nutritional services.   Spoke with patient regarding carb allotment during hospitalization. Patient is motivated and wants to count correctly, however, has been told multiple amounts for daily CHO intake. Educated on current inpatient glucose trends, impact of steroids, target goals, importance of being mindful with carbohydrate intake, impacts during pregnancy and reviewed recommended allotement for meals and snacks.  Patient expresses understanding. Will place dietitian consult to help further solidify numbers based on kg weight. Patient has seen outpatient dietitian on 07/02/19.   Thanks, Bronson Curb, MSN, RNC-OB Diabetes Coordinator [phone] (8a-5p)

## 2019-07-23 ENCOUNTER — Ambulatory Visit (HOSPITAL_COMMUNITY): Payer: BC Managed Care – PPO

## 2019-07-23 ENCOUNTER — Inpatient Hospital Stay (HOSPITAL_COMMUNITY): Payer: Medicaid Other

## 2019-07-23 ENCOUNTER — Ambulatory Visit (HOSPITAL_COMMUNITY): Admission: RE | Admit: 2019-07-23 | Payer: BC Managed Care – PPO | Source: Ambulatory Visit

## 2019-07-23 DIAGNOSIS — O10913 Unspecified pre-existing hypertension complicating pregnancy, third trimester: Secondary | ICD-10-CM

## 2019-07-23 LAB — CBC
HCT: 38.8 % (ref 36.0–46.0)
Hemoglobin: 11.5 g/dL — ABNORMAL LOW (ref 12.0–15.0)
MCH: 22.7 pg — ABNORMAL LOW (ref 26.0–34.0)
MCHC: 29.6 g/dL — ABNORMAL LOW (ref 30.0–36.0)
MCV: 76.7 fL — ABNORMAL LOW (ref 80.0–100.0)
Platelets: 260 10*3/uL (ref 150–400)
RBC: 5.06 MIL/uL (ref 3.87–5.11)
RDW: 17.9 % — ABNORMAL HIGH (ref 11.5–15.5)
WBC: 7.9 10*3/uL (ref 4.0–10.5)
nRBC: 0 % (ref 0.0–0.2)

## 2019-07-23 LAB — GLUCOSE, CAPILLARY
Glucose-Capillary: 90 mg/dL (ref 70–99)
Glucose-Capillary: 84 mg/dL (ref 70–99)
Glucose-Capillary: 99 mg/dL (ref 70–99)
Glucose-Capillary: 116 mg/dL — ABNORMAL HIGH (ref 70–99)

## 2019-07-23 LAB — COMPREHENSIVE METABOLIC PANEL
ALT: 163 U/L — ABNORMAL HIGH (ref 0–44)
AST: 93 U/L — ABNORMAL HIGH (ref 15–41)
Albumin: 2.5 g/dL — ABNORMAL LOW (ref 3.5–5.0)
Alkaline Phosphatase: 291 U/L — ABNORMAL HIGH (ref 38–126)
Anion gap: 16 — ABNORMAL HIGH (ref 5–15)
BUN: 5 mg/dL — ABNORMAL LOW (ref 6–20)
CO2: 18 mmol/L — ABNORMAL LOW (ref 22–32)
Calcium: 9.3 mg/dL (ref 8.9–10.3)
Chloride: 104 mmol/L (ref 98–111)
Creatinine, Ser: 0.5 mg/dL (ref 0.44–1.00)
GFR calc Af Amer: >60 mL/min (ref >60)
GFR calc non Af Amer: >60 mL/min (ref >60)
Glucose, Bld: 95 mg/dL (ref 70–99)
Potassium: 4.1 mmol/L (ref 3.5–5.1)
Sodium: 138 mmol/L (ref 135–145)
Total Bilirubin: 0.5 mg/dL (ref 0.3–1.2)
Total Protein: 6.9 g/dL (ref 6.5–8.1)

## 2019-07-23 LAB — CULTURE, BETA STREP (GROUP B ONLY)

## 2019-07-23 MED ORDER — LABETALOL HCL 200 MG PO TABS
200.0000 mg | ORAL_TABLET | Freq: Two times a day (BID) | ORAL | Status: DC
  Administered 2019-07-23 – 2019-07-24 (×3): 200 mg via ORAL
  Filled 2019-07-23 (×3): qty 1

## 2019-07-23 NOTE — Progress Notes (Signed)
Patient ID: Latoya Mora, female   DOB: 02/15/1996, 24 y.o.   MRN: [id number] [place]) NOTE  Latoya Mora is a 24 y.o. G2P1001 at [redacted]w[redacted]d by best clinical estimate who is admitted for [facility] and elevated LFTs.   Fetal presentation is cephalic. Length of Stay:  2  Days  Subjective: Patient denies any headaches, visual symptoms, RUQ/epigastric pain or other concerning symptoms. Very nervous about her LFTs.  Patient reports the fetal movement as active. Patient reports uterine contraction  activity as none. Patient reports  vaginal bleeding as none Patient describes fluid per vagina as none.  Vitals:  Blood pressure 113/70, pulse 97, temperature 98.5 F (36.9 C), temperature source Oral, resp. rate 18, height 5\' 8"  (1.727 m), weight (!) 148.7 kg, last menstrual period 11/14/2018, SpO2 100 %, unknown if currently breastfeeding.   Patient Vitals for the past 24 hrs:  BP Temp Temp src Pulse Resp SpO2  07/23/19 0631 113/70 98.5 F (36.9 C) Oral 97 18 100 %  07/22/19 2206 122/70 98.4 F (36.9 C) Oral 95 18 98 %  07/22/19 1923 132/66 97.8 F (36.6 C) Oral 98 17 100 %  07/22/19 1515 126/73 98 F (36.7 C) Oral 92 18 100 %  07/22/19 1213 (!) 111/59 97.8 F (36.6 C) Oral 90 18 100 %   Physical Examination: General appearance - alert, well appearing, and in no distress Heart - normal rate and regular rhythm Abdomen - soft, nontender, nondistended Fundal Height:  size equals dates Cervical Exam: Not evaluated. Extremities: extremities normal, atraumatic, no cyanosis or edema and Homans sign is negative, no sign of DVT Membranes:intact  Fetal Monitoring:   Fetal Heart Rate A  Mode External filed at 07/22/2019 2200  Baseline Rate (A) 140 bpm filed at 07/22/2019 2200  Variability 6-25 BPM filed at 07/22/2019 2200  Accelerations 15 x 15 filed at 07/22/2019 2200  Decelerations None filed at 07/22/2019 2200     Labs:  CBC Latest Ref Rng & Units 07/23/2019  07/22/2019 07/21/2019  WBC 4.0 - 10.5 K/uL 7.9 6.9 9.4  Hemoglobin 12.0 - 15.0 g/dL 11.5(L) 13.2 11.6(L)  Hematocrit 36.0 - 46.0 % 38.8 43.8 38.5  Platelets 150 - 400 K/uL 260 272 272   CMP Latest Ref Rng & Units 07/23/2019 07/22/2019 07/21/2019  Glucose 70 - 99 mg/dL 95 106(H) 83  BUN 6 - 20 mg/dL 5(L) <5(L) 6  Creatinine 0.44 - 1.00 mg/dL 0.50 0.46 0.47  Sodium 135 - 145 mmol/L 138 135 135  Potassium 3.5 - 5.1 mmol/L 4.1 4.0 3.9  Chloride 98 - 111 mmol/L 104 106 108  CO2 22 - 32 mmol/L 18(L) 18(L) 19(L)  Calcium 8.9 - 10.3 mg/dL 9.3 9.0 8.7(L)  Total Protein 6.5 - 8.1 g/dL 6.9 7.1 6.8  Total Bilirubin 0.3 - 1.2 mg/dL 0.5 0.4 0.6  Alkaline Phos 38 - 126 U/L 291(H) 335(H) 283(H)  AST 15 - 41 U/L 93(H) 63(H) 49(H)  ALT 0 - 44 U/L 163(H) 126(H) 116(H)   CBG (last 3)  Recent Labs    07/22/19 1514 07/22/19 2113 07/23/19 0629  GLUCAP 87 136* 90    Medications:  Scheduled . aspirin EC  162 mg Oral Daily  . docusate sodium  100 mg Oral Daily  . insulin aspart  0-16 Units Subcutaneous TID PC  . metFORMIN  1,000 mg Oral QHS  . pantoprazole  40 mg Oral Daily   I have reviewed the patient's current medications.  ASSESSMENT: Principal Problem:   Chronic hypertension  complicating pregnancy, antepartum Active Problems:   Obesity, Class III, BMI 40-49.9 (morbid obesity) ([facility])   Gestational diabetes mellitus (GDM) controlled on oral hypoglycemic drug   Elevated LFTs  PLAN: Called Dr. Tama High (MFM) and discussed increasing LFTs, with normal BP, normal Cr, normal CBC. No other severe features or symptoms concerning for cholestasis. He recommended to hold her Labetalol for now, discontinue Tylenol and PNVs and any other unnecessary medications.  Will check RUQ ultrasound; recheck labs tomorrow.  Plan discussed with patient. Reactive NSTs noted, continue BID.  BPP ordered for tomorrow, will get growth scan next week. Routine antenatal care  Verita Schneiders, MD 07/23/2019,8:07 AM

## 2019-07-23 NOTE — Progress Notes (Addendum)
Nutrition Consult:  GDM diet parameters  Pt has questions about what the CHO limits are for GDM diet in house. Has been told multiple different numbers for CHO limit  by [facility]  GDM meal plan is as follows: B: 30 - 35 g CHO L: 45-50 g CHO D: 45-50 g CHO All snacks  30 g CHO  Pt is agreeable to CHO limits. Have encouraged double protein portions to be requested by pt at all meals to help with hunger  Have encouraged pt to ask for snacks, as did not receive snacks yesterday.  Pt feels she has had adequate diet education ( 12/31, 1/7) and has no questions at this time.  Weyman Rodney M.Fredderick Severance LDN Neonatal Nutrition Support Specialist/RD III Pager [phone]      Phone [phone]

## 2019-07-24 ENCOUNTER — Inpatient Hospital Stay (HOSPITAL_COMMUNITY): Payer: Medicaid Other

## 2019-07-24 DIAGNOSIS — Z3A35 35 weeks gestation of pregnancy: Secondary | ICD-10-CM

## 2019-07-24 DIAGNOSIS — O24415 Gestational diabetes mellitus in pregnancy, controlled by oral hypoglycemic drugs: Secondary | ICD-10-CM

## 2019-07-24 DIAGNOSIS — O10913 Unspecified pre-existing hypertension complicating pregnancy, third trimester: Secondary | ICD-10-CM

## 2019-07-24 LAB — CBC
HCT: 37.2 % (ref 36.0–46.0)
Hemoglobin: 11 g/dL — ABNORMAL LOW (ref 12.0–15.0)
MCH: 22.7 pg — ABNORMAL LOW (ref 26.0–34.0)
MCHC: 29.6 g/dL — ABNORMAL LOW (ref 30.0–36.0)
MCV: 76.7 fL — ABNORMAL LOW (ref 80.0–100.0)
Platelets: 257 10*3/uL (ref 150–400)
RBC: 4.85 MIL/uL (ref 3.87–5.11)
RDW: 18.2 % — ABNORMAL HIGH (ref 11.5–15.5)
WBC: 8.9 10*3/uL (ref 4.0–10.5)
nRBC: 0 % (ref 0.0–0.2)

## 2019-07-24 LAB — COMPREHENSIVE METABOLIC PANEL
ALT: 179 U/L — ABNORMAL HIGH (ref 0–44)
AST: 83 U/L — ABNORMAL HIGH (ref 15–41)
Albumin: 2.5 g/dL — ABNORMAL LOW (ref 3.5–5.0)
Alkaline Phosphatase: 249 U/L — ABNORMAL HIGH (ref 38–126)
Anion gap: 9 (ref 5–15)
BUN: 9 mg/dL (ref 6–20)
CO2: 18 mmol/L — ABNORMAL LOW (ref 22–32)
Calcium: 8.5 mg/dL — ABNORMAL LOW (ref 8.9–10.3)
Chloride: 110 mmol/L (ref 98–111)
Creatinine, Ser: 0.62 mg/dL (ref 0.44–1.00)
GFR calc Af Amer: >60 mL/min (ref >60)
GFR calc non Af Amer: >60 mL/min (ref >60)
Glucose, Bld: 88 mg/dL (ref 70–99)
Potassium: 3.6 mmol/L (ref 3.5–5.1)
Sodium: 137 mmol/L (ref 135–145)
Total Bilirubin: 0.5 mg/dL (ref 0.3–1.2)
Total Protein: 6.4 g/dL — ABNORMAL LOW (ref 6.5–8.1)

## 2019-07-24 LAB — GLUCOSE, CAPILLARY
Glucose-Capillary: 80 mg/dL (ref 70–99)
Glucose-Capillary: 77 mg/dL (ref 70–99)

## 2019-07-24 NOTE — Discharge Summary (Signed)
Patient ID: Latoya Mora MRN: [id number] DOB/AGE: 19-Apr-1996 24 y.o.  Admit date: 07/21/2019 Discharge date: 07/24/2019  Admission Diagnoses:Chronic hypertension, elevated liver function test  Discharge Diagnoses: same  Prenatal Procedures: ultrasound  Consults: , Maternal Fetal Medicine  Hospital Course:  This is a 24 y.o. G2P1001 with IUP at 43w3dadmitted [facility] liver function tests were elevated and she was admitted to evaluate for preeclampsia. She has a similar condition with her last pregnancy and was induced at 37 weeks. Dr. SDonalee Citrinwas consulted. Her LFT rose only slightly during her stay. Abdominal UKoreashowed GB stones  She was deemed stable for discharge to home with outpatient follow up.  Discharge Exam: Temp:  [97.9 F (36.6 C)-99.1 F (37.3 C)] 99.1 F (37.3 C) (01/29 0809) Pulse Rate:  [87-99] 87 (01/29 0809) Resp:  [17-18] 17 (01/29 0417) BP: (116-137)/(58-94) 137/72 (01/29 0809) SpO2:  [97 %-100 %] 99 % (01/29 0417) Physical Examination: CONSTITUTIONAL: Well-developed, well-nourished female in no acute distress.  HENT:  Normocephalic, atraumatic, External right and left ear normal. Oropharynx is clear and moist EYES: Conjunctivae and EOM are normal. Pupils are equal, round, and reactive to light. No scleral icterus.  NECK: Normal range of motion, supple, no masses SKIN: Skin is warm and dry. No rash noted. Not diaphoretic. No erythema. No pallor. [place] Alert and oriented to person, place, and time. Normal reflexes, muscle tone coordination. No cranial nerve deficit noted. PSYCHIATRIC: Normal mood and affect. Normal behavior. Normal judgment and thought content. CARDIOVASCULAR: Normal heart rate noted, regular rhythm RESPIRATORY: Effort and breath sounds normal, no problems with respiration noted MUSCULOSKELETAL: Normal range of motion. No edema and no tenderness. 2+ distal pulses. ABDOMEN: Soft, nontender, nondistended, gravid. CERVIX:  Dilation: 1 Effacement (%): Thick Cervical Position: Posterior Station: Ballotable Presentation: Vertex Exam by:: m hoppenbauer rn   Fetal monitoring:  Fetal Heart Rate A  Mode External filed at 07/23/2019 2212  Baseline Rate (A) 135 bpm  [wandering baseline 135-140] filed at 07/23/2019 2212  Variability 6-25 BPM filed at 07/23/2019 2212  Accelerations 15 x 15 filed at 07/23/2019 2212  Decelerations None filed at 07/23/2019 2212     Significant Diagnostic Studies:  Results for orders placed or performed during the hospital encounter of 07/21/19 (from the past 168 hour(s))  Urinalysis, Routine w reflex microscopic   Collection Time: 07/21/19  2:40 PM  Result Value Ref Range   Color, Urine AMBER (A) YELLOW   APPearance HAZY (A) CLEAR   Specific Gravity, Urine 1.025 1.005 - 1.030   pH 5.0 5.0 - 8.0   Glucose, UA NEGATIVE NEGATIVE mg/dL   Hgb urine dipstick NEGATIVE NEGATIVE   Bilirubin Urine NEGATIVE NEGATIVE   Ketones, ur NEGATIVE NEGATIVE mg/dL   Protein, ur 30 (A) NEGATIVE mg/dL   Nitrite NEGATIVE NEGATIVE   Leukocytes,Ua SMALL (A) NEGATIVE   RBC / HPF 0-5 0 - 5 RBC/hpf   WBC, UA 6-10 0 - 5 WBC/hpf   Bacteria, UA RARE (A) NONE SEEN   Squamous Epithelial / LPF 0-5 0 - 5   Mucus PRESENT   Protein / creatinine ratio, urine   Collection Time: 07/21/19  3:31 PM  Result Value Ref Range   Creatinine, Urine 209.16 mg/dL   Total Protein, Urine 21 mg/dL   Protein Creatinine Ratio 0.10 0.00 - 0.15 mg/mg[Cre]  CBC   Collection Time: 07/21/19  3:38 PM  Result Value Ref Range   WBC 9.4 4.0 - 10.5 K/uL   RBC 5.13 (  H) 3.87 - 5.11 MIL/uL   Hemoglobin 11.6 (L) 12.0 - 15.0 g/dL   HCT 38.5 36.0 - 46.0 %   MCV 75.0 (L) 80.0 - 100.0 fL   MCH 22.6 (L) 26.0 - 34.0 pg   MCHC 30.1 30.0 - 36.0 g/dL   RDW 17.9 (H) 11.5 - 15.5 %   Platelets 272 150 - 400 K/uL   nRBC 0.0 0.0 - 0.2 %  Comprehensive metabolic panel   Collection Time: 07/21/19  3:38 PM  Result Value Ref Range   Sodium 135  135 - 145 mmol/L   Potassium 3.9 3.5 - 5.1 mmol/L   Chloride 108 98 - 111 mmol/L   CO2 19 (L) 22 - 32 mmol/L   Glucose, Bld 83 70 - 99 mg/dL   BUN 6 6 - 20 mg/dL   Creatinine, Ser 0.47 0.44 - 1.00 mg/dL   Calcium 8.7 (L) 8.9 - 10.3 mg/dL   Total Protein 6.8 6.5 - 8.1 g/dL   Albumin 2.5 (L) 3.5 - 5.0 g/dL   AST 49 (H) 15 - 41 U/L   ALT 116 (H) 0 - 44 U/L   Alkaline Phosphatase 283 (H) 38 - 126 U/L   Total Bilirubin 0.6 0.3 - 1.2 mg/dL   GFR calc non Af Amer >60 >60 mL/min   GFR calc Af Amer >60 >60 mL/min   Anion gap 8 5 - 15  Culture, OB Urine   Collection Time: 07/21/19  4:00 PM   Specimen: Urine, Random  Result Value Ref Range   Specimen Description URINE, RANDOM    Special Requests NONE    Culture (A)     <10,000 COLONIES/mL INSIGNIFICANT GROWTH NO GROUP B STREP (S.AGALACTIAE) ISOLATED Performed at [facility], [address], [place], [place] [address]    Report Status 07/22/2019 FINAL   SARS CORONAVIRUS 2 (TAT 6-24 HRS) Nasopharyngeal Nasopharyngeal Swab   Collection Time: 07/21/19  5:19 PM   Specimen: Nasopharyngeal Swab  Result Value Ref Range   SARS Coronavirus 2 NEGATIVE NEGATIVE  Culture, beta strep (group b only)   Collection Time: 07/21/19  5:34 PM   Specimen: Vaginal/Rectal; Genital  Result Value Ref Range   Specimen Description VAGINAL/RECTAL    Special Requests NONE    Culture      NO GROUP B STREP (S.AGALACTIAE) ISOLATED Performed at [facility], [place] [address], [place], [place] [address]    Report Status 07/23/2019 FINAL   Glucose, capillary   Collection Time: 07/21/19  9:03 PM  Result Value Ref Range   Glucose-Capillary 95 70 - 99 mg/dL  CBC   Collection Time: 07/22/19  5:37 AM  Result Value Ref Range   WBC 6.9 4.0 - 10.5 K/uL   RBC 5.83 (H) 3.87 - 5.11 MIL/uL   Hemoglobin 13.2 12.0 - 15.0 g/dL   HCT 43.8 36.0 - 46.0 %   MCV 75.1 (L) 80.0 - 100.0 fL   MCH 22.6 (L) 26.0 - 34.0 pg   MCHC 30.1 30.0 - 36.0 g/dL   RDW 18.7 (H) 11.5  - 15.5 %   Platelets 272 150 - 400 K/uL   nRBC 0.0 0.0 - 0.2 %  Comprehensive metabolic panel   Collection Time: 07/22/19  5:37 AM  Result Value Ref Range   Sodium 135 135 - 145 mmol/L   Potassium 4.0 3.5 - 5.1 mmol/L   Chloride 106 98 - 111 mmol/L   CO2 18 (L) 22 - 32 mmol/L   Glucose, Bld  106 (H) 70 - 99 mg/dL   BUN <5 (L) 6 - 20 mg/dL   Creatinine, Ser 0.46 0.44 - 1.00 mg/dL   Calcium 9.0 8.9 - 10.3 mg/dL   Total Protein 7.1 6.5 - 8.1 g/dL   Albumin 2.6 (L) 3.5 - 5.0 g/dL   AST 63 (H) 15 - 41 U/L   ALT 126 (H) 0 - 44 U/L   Alkaline Phosphatase 335 (H) 38 - 126 U/L   Total Bilirubin 0.4 0.3 - 1.2 mg/dL   GFR calc non Af Amer >60 >60 mL/min   GFR calc Af Amer >60 >60 mL/min   Anion gap 11 5 - 15  Type and screen   Collection Time: 07/22/19  5:37 AM  Result Value Ref Range   ABO/RH(D) B POS    Antibody Screen NEG    Sample Expiration      07/25/2019,2359 Performed at [facility], [place] [address], [place], [place] [address]   ABO/Rh   Collection Time: 07/22/19  5:37 AM  Result Value Ref Range   ABO/RH(D)      B POS Performed at [place] [address] [place],  [address]   Glucose, capillary   Collection Time: 07/22/19  6:31 AM  Result Value Ref Range   Glucose-Capillary 96 70 - 99 mg/dL  Glucose, capillary   Collection Time: 07/22/19 11:08 AM  Result Value Ref Range   Glucose-Capillary 79 70 - 99 mg/dL   Comment 1 Notify RN    Comment 2 Document in Chart   Glucose, capillary   Collection Time: 07/22/19  3:14 PM  Result Value Ref Range   Glucose-Capillary 87 70 - 99 mg/dL   Comment 1 Notify RN    Comment 2 Document in Chart   Glucose, capillary   Collection Time: 07/22/19  9:13 PM  Result Value Ref Range   Glucose-Capillary 136 (H) 70 - 99 mg/dL  CBC   Collection Time: 07/23/19  5:15 AM  Result Value Ref Range   WBC 7.9 4.0 - 10.5 K/uL   RBC 5.06 3.87 - 5.11 MIL/uL   Hemoglobin 11.5 (L) 12.0 - 15.0 g/dL   HCT 38.8 36.0 - 46.0 %     MCV 76.7 (L) 80.0 - 100.0 fL   MCH 22.7 (L) 26.0 - 34.0 pg   MCHC 29.6 (L) 30.0 - 36.0 g/dL   RDW 17.9 (H) 11.5 - 15.5 %   Platelets 260 150 - 400 K/uL   nRBC 0.0 0.0 - 0.2 %  Comprehensive metabolic panel   Collection Time: 07/23/19  5:15 AM  Result Value Ref Range   Sodium 138 135 - 145 mmol/L   Potassium 4.1 3.5 - 5.1 mmol/L   Chloride 104 98 - 111 mmol/L   CO2 18 (L) 22 - 32 mmol/L   Glucose, Bld 95 70 - 99 mg/dL   BUN 5 (L) 6 - 20 mg/dL   Creatinine, Ser 0.50 0.44 - 1.00 mg/dL   Calcium 9.3 8.9 - 10.3 mg/dL   Total Protein 6.9 6.5 - 8.1 g/dL   Albumin 2.5 (L) 3.5 - 5.0 g/dL   AST 93 (H) 15 - 41 U/L   ALT 163 (H) 0 - 44 U/L   Alkaline Phosphatase 291 (H) 38 - 126 U/L   Total Bilirubin 0.5 0.3 - 1.2 mg/dL   GFR calc non Af Amer >60 >60 mL/min   GFR calc Af Amer >60 >60 mL/min   Anion gap 16 (H) 5 -  15  Glucose, capillary   Collection Time: 07/23/19  6:29 AM  Result Value Ref Range   Glucose-Capillary 90 70 - 99 mg/dL  Glucose, capillary   Collection Time: 07/23/19 11:02 AM  Result Value Ref Range   Glucose-Capillary 84 70 - 99 mg/dL   Comment 1 Notify RN    Comment 2 Document in Chart   Glucose, capillary   Collection Time: 07/23/19  3:17 PM  Result Value Ref Range   Glucose-Capillary 99 70 - 99 mg/dL   Comment 1 Notify RN    Comment 2 Document in Chart   Glucose, capillary   Collection Time: 07/23/19  9:20 PM  Result Value Ref Range   Glucose-Capillary 116 (H) 70 - 99 mg/dL  CBC   Collection Time: 07/24/19  5:32 AM  Result Value Ref Range   WBC 8.9 4.0 - 10.5 K/uL   RBC 4.85 3.87 - 5.11 MIL/uL   Hemoglobin 11.0 (L) 12.0 - 15.0 g/dL   HCT 37.2 36.0 - 46.0 %   MCV 76.7 (L) 80.0 - 100.0 fL   MCH 22.7 (L) 26.0 - 34.0 pg   MCHC 29.6 (L) 30.0 - 36.0 g/dL   RDW 18.2 (H) 11.5 - 15.5 %   Platelets 257 150 - 400 K/uL   nRBC 0.0 0.0 - 0.2 %  Comprehensive metabolic panel   Collection Time: 07/24/19  5:32 AM  Result Value Ref Range   Sodium 137 135 - 145  mmol/L   Potassium 3.6 3.5 - 5.1 mmol/L   Chloride 110 98 - 111 mmol/L   CO2 18 (L) 22 - 32 mmol/L   Glucose, Bld 88 70 - 99 mg/dL   BUN 9 6 - 20 mg/dL   Creatinine, Ser 0.62 0.44 - 1.00 mg/dL   Calcium 8.5 (L) 8.9 - 10.3 mg/dL   Total Protein 6.4 (L) 6.5 - 8.1 g/dL   Albumin 2.5 (L) 3.5 - 5.0 g/dL   AST 83 (H) 15 - 41 U/L   ALT 179 (H) 0 - 44 U/L   Alkaline Phosphatase 249 (H) 38 - 126 U/L   Total Bilirubin 0.5 0.3 - 1.2 mg/dL   GFR calc non Af Amer >60 >60 mL/min   GFR calc Af Amer >60 >60 mL/min   Anion gap 9 5 - 15  Glucose, capillary   Collection Time: 07/24/19  6:39 AM  Result Value Ref Range   Glucose-Capillary 80 70 - 99 mg/dL    Discharge Condition: Stable  Disposition: Discharge disposition: 01-Home or Self Care        Discharge Instructions    Discharge patient   Complete by: As directed    Discharge disposition: 01-Home or Self Care   Discharge patient date: 07/24/2019     Allergies as of 07/24/2019      Reactions   Etodolac Nausea Only   Meloxicam Other (See Comments)   Abdominal pain   Flexeril [cyclobenzaprine] Nausea Only   Nsaids Other (See Comments)   Stomach pain   Prozac [fluoxetine Hcl] Other (See Comments)   Felt bad/awful      Medication List    STOP taking these medications   acetaminophen 500 MG tablet Commonly known as: TYLENOL     TAKE these medications   Accu-Chek Guide test strip Generic drug: glucose blood USE TO CHECK BLOOD SUGARS FOUR TIMES A DAY WAS INSTRUCTED   Accu-Chek Guide w/Device Kit 1 Device by Does not apply route 4 (four) times daily.   Accu-Chek Softclix Lancets  lancets Use as instructed   aspirin EC 81 MG tablet Take 2 tablets (162 mg total) by mouth daily.   butalbital-acetaminophen-caffeine 50-325-40 MG tablet Commonly known as: FIORICET Take 1-2 tablets by mouth every 6 (six) hours as needed for headache.   labetalol 200 MG tablet Commonly known as: NORMODYNE TAKE 1 TABLET BY MOUTH TWICE A  DAY   metFORMIN 1000 MG tablet Commonly known as: Glucophage 1 tab po with snack before bedtime. What changed:   how much to take  how to take this  when to take this   omeprazole 20 MG tablet Commonly known as: PriLOSEC OTC Take 1 tablet (20 mg total) by mouth daily.   prenatal multivitamin Tabs tablet Take 1 tablet by mouth daily at 12 noon.      Follow-up [place] for [facility] Follow up in 4 day(s).   Specialty: Obstetrics and Gynecology Contact information: [address] [place] [phone]          Signed: Emeterio Reeve M.D. 07/24/2019, 10:53 AM

## 2019-07-24 NOTE — Discharge Instructions (Signed)
Preeclampsia and Eclampsia Preeclampsia is a serious condition that may develop during pregnancy. This condition causes high blood pressure and increased protein in your urine along with other symptoms, such as headaches and vision changes. These symptoms may develop as the condition gets worse. Preeclampsia may occur at 20 weeks of pregnancy or later. Diagnosing and treating preeclampsia early is very important. If not treated early, it can cause serious problems for you and your baby. One problem it can lead to is eclampsia. Eclampsia is a condition that causes muscle jerking or shaking (convulsions or seizures) and other serious problems for the mother. During pregnancy, delivering your baby may be the best treatment for preeclampsia or eclampsia. For most women, preeclampsia and eclampsia symptoms go away after giving birth. In rare cases, a woman may develop preeclampsia after giving birth (postpartum preeclampsia). This usually occurs within 48 hours after childbirth but may occur up to 6 weeks after giving birth. What are the causes? The cause of preeclampsia is not known. What increases the risk? The following risk factors make you more likely to develop preeclampsia:  Being pregnant for the first time.  Having had preeclampsia during a past pregnancy.  Having a family history of preeclampsia.  Having high blood pressure.  Being pregnant with more than one baby.  Being 35 or older.  Being African-American.  Having kidney disease or diabetes.  Having medical conditions such as lupus or blood diseases.  Being very overweight (obese). What are the signs or symptoms? The most common symptoms are:  Severe headaches.  Vision problems, such as blurred or double vision.  Abdominal pain, especially upper abdominal pain. Other symptoms that may develop as the condition gets worse include:  Sudden weight gain.  Sudden swelling of the hands, face, legs, and feet.  Severe nausea  and vomiting.  Numbness in the face, arms, legs, and feet.  Dizziness.  Urinating less than usual.  Slurred speech.  Convulsions or seizures. How is this diagnosed? There are no screening tests for preeclampsia. Your health care provider will ask you about symptoms and check for signs of preeclampsia during your prenatal visits. You may also have tests that include:  Checking your blood pressure.  Urine tests to check for protein. Your health care provider will check for this at every prenatal visit.  Blood tests.  Monitoring your baby's heart rate.  Ultrasound. How is this treated? You and your health care provider will determine the treatment approach that is best for you. Treatment may include:  Having more frequent prenatal exams to check for signs of preeclampsia, if you have an increased risk for preeclampsia.  Medicine to lower your blood pressure.  Staying in the hospital, if your condition is severe. There, treatment will focus on controlling your blood pressure and the amount of fluids in your body (fluid retention).  Taking medicine (magnesium sulfate) to prevent seizures. This may be given as an injection or through an IV.  Taking a low-dose aspirin during your pregnancy.  Delivering your baby early. You may have your labor started with medicine (induced), or you may have a cesarean delivery. Follow these instructions at home: Eating and drinking   Drink enough fluid to keep your urine pale yellow.  Avoid caffeine. Lifestyle  Do not use any products that contain nicotine or tobacco, such as cigarettes and e-cigarettes. If you need help quitting, ask your health care provider.  Do not use alcohol or drugs.  Avoid stress as much as possible. Rest and get   plenty of sleep. General instructions  Take over-the-counter and prescription medicines only as told by your health care provider.  When lying down, lie on your left side. This keeps pressure off your  major blood vessels.  When sitting or lying down, raise (elevate) your feet. Try putting some pillows underneath your lower legs.  Exercise regularly. Ask your health care provider what kinds of exercise are best for you.  Keep all follow-up and prenatal visits as told by your health care provider. This is important. How is this prevented? There is no known way of preventing preeclampsia or eclampsia from developing. However, to lower your risk of complications and detect problems early:  Get regular prenatal care. Your health care provider may be able to diagnose and treat the condition early.  Maintain a healthy weight. Ask your health care provider for help managing weight gain during pregnancy.  Work with your health care provider to manage any long-term (chronic) health conditions you have, such as diabetes or kidney problems.  You may have tests of your blood pressure and kidney function after giving birth.  Your health care provider may have you take low-dose aspirin during your next pregnancy. Contact a health care provider if:  You have symptoms that your health care provider told you may require more treatment or monitoring, such as: ? Headaches. ? Nausea or vomiting. ? Abdominal pain. ? Dizziness. ? Light-headedness. Get help right away if:  You have severe: ? Abdominal pain. ? Headaches that do not get better. ? Dizziness. ? Vision problems. ? Confusion. ? Nausea or vomiting.  You have any of the following: ? A seizure. ? Sudden, rapid weight gain. ? Sudden swelling in your hands, ankles, or face. ? Trouble moving any part of your body. ? Numbness in any part of your body. ? Trouble speaking. ? Abnormal bleeding.  You faint. Summary  Preeclampsia is a serious condition that may develop during pregnancy.  This condition causes high blood pressure and increased protein in your urine along with other symptoms, such as headaches and vision  changes.  Diagnosing and treating preeclampsia early is very important. If not treated early, it can cause serious problems for you and your baby.  Get help right away if you have symptoms that your health care provider told you to watch for. This information is not intended to replace advice given to you by your health care provider. Make sure you discuss any questions you have with your health care provider. Document Revised: 02/11/2018 Document Reviewed: 01/16/2016 Elsevier Patient Education  2020 [employer]  

## 2019-07-25 LAB — BILE ACIDS, TOTAL: Bile Acids Total: 5.7 umol/L (ref 0.0–10.0)

## 2019-07-28 ENCOUNTER — Other Ambulatory Visit (HOSPITAL_COMMUNITY)
Admission: RE | Admit: 2019-07-28 | Discharge: 2019-07-28 | Disposition: A | Discharge status: Home / Self Care | Payer: BC Managed Care – PPO | Source: Ambulatory Visit | Attending: Family Medicine | Admitting: Family Medicine

## 2019-07-28 ENCOUNTER — Other Ambulatory Visit: Payer: Self-pay

## 2019-07-28 ENCOUNTER — Telehealth (HOSPITAL_COMMUNITY): Payer: Self-pay | Admitting: *Deleted

## 2019-07-28 ENCOUNTER — Encounter (HOSPITAL_COMMUNITY): Payer: Self-pay | Admitting: *Deleted

## 2019-07-28 ENCOUNTER — Ambulatory Visit (INDEPENDENT_AMBULATORY_CARE_PROVIDER_SITE_OTHER): Payer: BC Managed Care – PPO | Admitting: Family Medicine

## 2019-07-28 VITALS — BP 122/87 | HR 118 | Wt 322.0 lb

## 2019-07-28 DIAGNOSIS — O0990 Supervision of high risk pregnancy, unspecified, unspecified trimester: Secondary | ICD-10-CM

## 2019-07-28 DIAGNOSIS — O10919 Unspecified pre-existing hypertension complicating pregnancy, unspecified trimester: Secondary | ICD-10-CM

## 2019-07-28 DIAGNOSIS — R7989 Other specified abnormal findings of blood chemistry: Secondary | ICD-10-CM

## 2019-07-28 DIAGNOSIS — Z3A36 36 weeks gestation of pregnancy: Secondary | ICD-10-CM

## 2019-07-28 DIAGNOSIS — O24415 Gestational diabetes mellitus in pregnancy, controlled by oral hypoglycemic drugs: Secondary | ICD-10-CM

## 2019-07-28 NOTE — Telephone Encounter (Signed)
Preadmission screen  

## 2019-07-28 NOTE — Patient Instructions (Signed)

## 2019-07-28 NOTE — Progress Notes (Signed)
   PRENATAL VISIT NOTE  Subjective:  Latoya Mora is a 24 y.o. G2P1001 at [redacted]w[redacted]d being seen today for ongoing prenatal care.  She is currently monitored for the following issues for this high-risk pregnancy and has Seasonal allergies; Anxiety and depression; Frequent headaches; Obesity, Class III, BMI 40-49.9 (morbid obesity) ([place]); PCOS (polycystic ovarian syndrome); Supervision of high risk pregnancy, antepartum; Dermoid cyst of left ovary; Chronic hypertension complicating pregnancy, antepartum; Short interval between pregnancies affecting pregnancy, antepartum; Stress incontinence during pregnancy; Gestational diabetes mellitus (GDM) controlled on oral hypoglycemic drug; and Elevated LFTs on their problem list.  Patient reports nausea.  Contractions: Irritability. Vag. Bleeding: None.  Movement: Present. Denies leaking of fluid.   The following portions of the patient's history were reviewed and updated as appropriate: allergies, current medications, past family history, past medical history, past social history, past surgical history and problem list.   Objective:   Vitals:   07/28/19 0902  BP: 122/87  Pulse: (!) 118  Weight: (!) 322 lb (146.1 kg)    Fetal Status: Fetal Heart Rate (bpm): 150 Fundal Height: 39 cm Movement: Present  Presentation: Vertex  General:  Alert, oriented and cooperative. Patient is in no acute distress.  Skin: Skin is warm and dry. No rash noted.   Cardiovascular: Normal heart rate noted  Respiratory: Normal respiratory effort, no problems with respiration noted  Abdomen: Soft, gravid, appropriate for gestational age.  Pain/Pressure: Present     Pelvic: Cervical exam performed Dilation: 1.5 Effacement (%): 20 Station: -3  Extremities: Normal range of motion.  Edema: None  Mental Status: Normal mood and affect. Normal behavior. Normal judgment and thought content.   Assessment and Plan:  Pregnancy: G2P1001 at [redacted]w[redacted]d 1. Chronic hypertension complicating  pregnancy, antepartum With elevated LFT's, per pt. For delivery at 37 wks, not obviously pre-e, but worsening as in last pregnancy--IOL scheduled at 37 wks--wants to avoid a foley balloon if possible - Comprehensive metabolic panel  2. Gestational diabetes mellitus (GDM) in third trimester controlled on oral hypoglycemic drug In testing. CBGs are good, except fastings in the 100-105 range despite metformin 1000 at hs. Growth is at 99%.  3. Supervision of high risk pregnancy, antepartum GBS is negative - GC/Chlamydia probe amp ([place])not at [facility] Reports decreased FM NST:  Baseline: 150 bpm, Variability: Good {> 6 bpm), Accelerations: Reactive and Decelerations: Absent   4. Elevated LFTs - Comprehensive metabolic panel  Preterm labor symptoms and general obstetric precautions including but not limited to vaginal bleeding, contractions, leaking of fluid and fetal movement were reviewed in detail with the patient. Please refer to After Visit Summary for other counseling recommendations.   Return in 6 weeks (on 09/08/2019).  Future Appointments  Date Time Provider Junction  07/31/2019  8:30 AM WH-MFC [place] 1 WH-MFCUS MFC-US  07/31/2019  8:45 AM [place] MFC-US  08/01/2019  9:45 AM MC-SCREENING MC-SDSC None  08/04/2019 12:00 AM MC-LD SCHED ROOM MC-INDC None  09/08/2019 10:00 AM Aletha Halim, MD [facility]    Donnamae Jude, MD

## 2019-07-29 ENCOUNTER — Other Ambulatory Visit: Payer: Self-pay | Admitting: Advanced Practice Midwife

## 2019-07-29 LAB — COMPREHENSIVE METABOLIC PANEL
ALT: 163 IU/L — ABNORMAL HIGH (ref 0–32)
AST: 55 IU/L — ABNORMAL HIGH (ref 0–40)
Albumin/Globulin Ratio: 1.3 (ref 1.2–2.2)
Albumin: 3.8 g/dL — ABNORMAL LOW (ref 3.9–5.0)
Alkaline Phosphatase: 402 IU/L — ABNORMAL HIGH (ref 39–117)
BUN/Creatinine Ratio: 8 — ABNORMAL LOW (ref 9–23)
BUN: 4 mg/dL — ABNORMAL LOW (ref 6–20)
Bilirubin Total: 0.2 mg/dL (ref 0.0–1.2)
CO2: 16 mmol/L — ABNORMAL LOW (ref 20–29)
Calcium: 9.3 mg/dL (ref 8.7–10.2)
Chloride: 103 mmol/L (ref 96–106)
Creatinine, Ser: 0.51 mg/dL — ABNORMAL LOW (ref 0.57–1.00)
GFR calc Af Amer: 153 mL/min/{1.73_m2} (ref >59)
GFR calc non Af Amer: 133 mL/min/{1.73_m2} (ref >59)
Globulin, Total: 3 g/dL (ref 1.5–4.5)
Glucose: 84 mg/dL (ref 65–99)
Potassium: 4.7 mmol/L (ref 3.5–5.2)
Sodium: 135 mmol/L (ref 134–144)
Total Protein: 6.8 g/dL (ref 6.0–8.5)

## 2019-07-29 LAB — GC/CHLAMYDIA PROBE AMP (~~LOC~~) NOT AT ARMC
Chlamydia: NEGATIVE
Comment: NEGATIVE
Comment: NORMAL
Neisseria Gonorrhea: NEGATIVE

## 2019-07-30 ENCOUNTER — Other Ambulatory Visit (HOSPITAL_COMMUNITY): Payer: Self-pay | Admitting: Advanced Practice Midwife

## 2019-07-31 ENCOUNTER — Ambulatory Visit (HOSPITAL_COMMUNITY): Payer: BC Managed Care – PPO

## 2019-07-31 ENCOUNTER — Other Ambulatory Visit: Payer: Self-pay

## 2019-07-31 ENCOUNTER — Ambulatory Visit (HOSPITAL_COMMUNITY): Payer: BC Managed Care – PPO | Admitting: *Deleted

## 2019-07-31 ENCOUNTER — Ambulatory Visit (HOSPITAL_COMMUNITY)
Admission: RE | Admit: 2019-07-31 | Discharge: 2019-07-31 | Disposition: A | Discharge status: Home / Self Care | Payer: BC Managed Care – PPO | Source: Ambulatory Visit | Attending: Obstetrics and Gynecology | Admitting: Obstetrics and Gynecology

## 2019-07-31 ENCOUNTER — Encounter (HOSPITAL_COMMUNITY): Payer: Self-pay | Admitting: *Deleted

## 2019-07-31 DIAGNOSIS — O09899 Supervision of other high risk pregnancies, unspecified trimester: Secondary | ICD-10-CM

## 2019-07-31 DIAGNOSIS — Z3A36 36 weeks gestation of pregnancy: Secondary | ICD-10-CM

## 2019-07-31 DIAGNOSIS — O24415 Gestational diabetes mellitus in pregnancy, controlled by oral hypoglycemic drugs: Secondary | ICD-10-CM | POA: Insufficient documentation

## 2019-07-31 DIAGNOSIS — O10013 Pre-existing essential hypertension complicating pregnancy, third trimester: Secondary | ICD-10-CM | POA: Diagnosis not present

## 2019-07-31 DIAGNOSIS — O09893 Supervision of other high risk pregnancies, third trimester: Secondary | ICD-10-CM

## 2019-07-31 DIAGNOSIS — O0990 Supervision of high risk pregnancy, unspecified, unspecified trimester: Secondary | ICD-10-CM

## 2019-07-31 DIAGNOSIS — O09293 Supervision of pregnancy with other poor reproductive or obstetric history, third trimester: Secondary | ICD-10-CM

## 2019-07-31 DIAGNOSIS — O99213 Obesity complicating pregnancy, third trimester: Secondary | ICD-10-CM

## 2019-07-31 DIAGNOSIS — Z362 Encounter for other antenatal screening follow-up: Secondary | ICD-10-CM

## 2019-07-31 DIAGNOSIS — D279 Benign neoplasm of unspecified ovary: Secondary | ICD-10-CM

## 2019-07-31 DIAGNOSIS — O3483 Maternal care for other abnormalities of pelvic organs, third trimester: Secondary | ICD-10-CM

## 2019-08-01 ENCOUNTER — Other Ambulatory Visit (HOSPITAL_COMMUNITY)
Admission: RE | Admit: 2019-08-01 | Discharge: 2019-08-01 | Disposition: A | Discharge status: Home / Self Care | Payer: BC Managed Care – PPO | Source: Ambulatory Visit | Attending: Family Medicine | Admitting: Family Medicine

## 2019-08-01 DIAGNOSIS — Z20822 Contact with and (suspected) exposure to covid-19: Secondary | ICD-10-CM | POA: Diagnosis not present

## 2019-08-01 DIAGNOSIS — Z01812 Encounter for preprocedural laboratory examination: Secondary | ICD-10-CM | POA: Insufficient documentation

## 2019-08-01 LAB — SARS CORONAVIRUS 2 (TAT 6-24 HRS): SARS Coronavirus 2: NEGATIVE

## 2019-08-04 ENCOUNTER — Inpatient Hospital Stay (HOSPITAL_COMMUNITY): Payer: Medicaid Other | Admitting: Anesthesiology

## 2019-08-04 ENCOUNTER — Inpatient Hospital Stay (HOSPITAL_COMMUNITY)
Admission: AD | Admit: 2019-08-04 | Discharge: 2019-08-06 | DRG: 807 | Disposition: A | Discharge status: Home / Self Care | Payer: Medicaid Other | Attending: Family Medicine | Admitting: Family Medicine

## 2019-08-04 ENCOUNTER — Other Ambulatory Visit: Payer: Self-pay

## 2019-08-04 ENCOUNTER — Inpatient Hospital Stay (HOSPITAL_COMMUNITY): Admit: 2019-08-04 | Payer: BC Managed Care – PPO | Admitting: Family Medicine

## 2019-08-04 ENCOUNTER — Inpatient Hospital Stay (HOSPITAL_COMMUNITY): Payer: Medicaid Other

## 2019-08-04 ENCOUNTER — Encounter (HOSPITAL_COMMUNITY): Payer: Self-pay | Admitting: Family Medicine

## 2019-08-04 DIAGNOSIS — O99214 Obesity complicating childbirth: Secondary | ICD-10-CM | POA: Diagnosis present

## 2019-08-04 DIAGNOSIS — F329 Major depressive disorder, single episode, unspecified: Secondary | ICD-10-CM | POA: Diagnosis present

## 2019-08-04 DIAGNOSIS — F32A Depression, unspecified: Secondary | ICD-10-CM | POA: Diagnosis present

## 2019-08-04 DIAGNOSIS — Z3A37 37 weeks gestation of pregnancy: Secondary | ICD-10-CM

## 2019-08-04 DIAGNOSIS — O0990 Supervision of high risk pregnancy, unspecified, unspecified trimester: Secondary | ICD-10-CM

## 2019-08-04 DIAGNOSIS — O24425 Gestational diabetes mellitus in childbirth, controlled by oral hypoglycemic drugs: Secondary | ICD-10-CM | POA: Diagnosis present

## 2019-08-04 DIAGNOSIS — D271 Benign neoplasm of left ovary: Secondary | ICD-10-CM | POA: Diagnosis present

## 2019-08-04 DIAGNOSIS — O99344 Other mental disorders complicating childbirth: Secondary | ICD-10-CM | POA: Diagnosis present

## 2019-08-04 DIAGNOSIS — F419 Anxiety disorder, unspecified: Secondary | ICD-10-CM | POA: Diagnosis present

## 2019-08-04 DIAGNOSIS — O09899 Supervision of other high risk pregnancies, unspecified trimester: Secondary | ICD-10-CM

## 2019-08-04 DIAGNOSIS — O10919 Unspecified pre-existing hypertension complicating pregnancy, unspecified trimester: Secondary | ICD-10-CM | POA: Diagnosis present

## 2019-08-04 DIAGNOSIS — O139 Gestational [pregnancy-induced] hypertension without significant proteinuria, unspecified trimester: Secondary | ICD-10-CM | POA: Diagnosis present

## 2019-08-04 DIAGNOSIS — E282 Polycystic ovarian syndrome: Secondary | ICD-10-CM | POA: Diagnosis present

## 2019-08-04 DIAGNOSIS — O99892 Other specified diseases and conditions complicating childbirth: Secondary | ICD-10-CM | POA: Diagnosis present

## 2019-08-04 DIAGNOSIS — Z20822 Contact with and (suspected) exposure to covid-19: Secondary | ICD-10-CM | POA: Diagnosis present

## 2019-08-04 DIAGNOSIS — O24415 Gestational diabetes mellitus in pregnancy, controlled by oral hypoglycemic drugs: Secondary | ICD-10-CM | POA: Diagnosis present

## 2019-08-04 DIAGNOSIS — O1002 Pre-existing essential hypertension complicating childbirth: Principal | ICD-10-CM | POA: Diagnosis present

## 2019-08-04 DIAGNOSIS — R7989 Other specified abnormal findings of blood chemistry: Secondary | ICD-10-CM | POA: Diagnosis present

## 2019-08-04 LAB — CBC
HCT: 39.3 % (ref 36.0–46.0)
HCT: 40.7 % (ref 36.0–46.0)
Hemoglobin: 11.7 g/dL — ABNORMAL LOW (ref 12.0–15.0)
Hemoglobin: 12.2 g/dL (ref 12.0–15.0)
MCH: 22.7 pg — ABNORMAL LOW (ref 26.0–34.0)
MCH: 22.9 pg — ABNORMAL LOW (ref 26.0–34.0)
MCHC: 29.8 g/dL — ABNORMAL LOW (ref 30.0–36.0)
MCHC: 30 g/dL (ref 30.0–36.0)
MCV: 76.3 fL — ABNORMAL LOW (ref 80.0–100.0)
MCV: 76.4 fL — ABNORMAL LOW (ref 80.0–100.0)
Platelets: 271 10*3/uL (ref 150–400)
Platelets: 220 10*3/uL (ref 150–400)
RBC: 5.15 MIL/uL — ABNORMAL HIGH (ref 3.87–5.11)
RBC: 5.33 MIL/uL — ABNORMAL HIGH (ref 3.87–5.11)
RDW: 17.8 % — ABNORMAL HIGH (ref 11.5–15.5)
RDW: 18 % — ABNORMAL HIGH (ref 11.5–15.5)
WBC: 10 10*3/uL (ref 4.0–10.5)
WBC: 8.5 10*3/uL (ref 4.0–10.5)
nRBC: 0 % (ref 0.0–0.2)
nRBC: 0 % (ref 0.0–0.2)

## 2019-08-04 LAB — TYPE AND SCREEN
ABO/RH(D): B POS
Antibody Screen: NEGATIVE

## 2019-08-04 LAB — RPR: RPR Ser Ql: NONREACTIVE

## 2019-08-04 LAB — COMPREHENSIVE METABOLIC PANEL
ALT: 51 U/L — ABNORMAL HIGH (ref 0–44)
AST: 31 U/L (ref 15–41)
Albumin: 2.5 g/dL — ABNORMAL LOW (ref 3.5–5.0)
Alkaline Phosphatase: 373 U/L — ABNORMAL HIGH (ref 38–126)
Anion gap: 10 (ref 5–15)
BUN: 5 mg/dL — ABNORMAL LOW (ref 6–20)
CO2: 21 mmol/L — ABNORMAL LOW (ref 22–32)
Calcium: 8.9 mg/dL (ref 8.9–10.3)
Chloride: 103 mmol/L (ref 98–111)
Creatinine, Ser: 0.51 mg/dL (ref 0.44–1.00)
GFR calc Af Amer: >60 mL/min (ref >60)
GFR calc non Af Amer: >60 mL/min (ref >60)
Glucose, Bld: 102 mg/dL — ABNORMAL HIGH (ref 70–99)
Potassium: 4.1 mmol/L (ref 3.5–5.1)
Sodium: 134 mmol/L — ABNORMAL LOW (ref 135–145)
Total Bilirubin: 0.5 mg/dL (ref 0.3–1.2)
Total Protein: 6.1 g/dL — ABNORMAL LOW (ref 6.5–8.1)

## 2019-08-04 LAB — GLUCOSE, CAPILLARY
Glucose-Capillary: 104 mg/dL — ABNORMAL HIGH (ref 70–99)
Glucose-Capillary: 121 mg/dL — ABNORMAL HIGH (ref 70–99)
Glucose-Capillary: 114 mg/dL — ABNORMAL HIGH (ref 70–99)
Glucose-Capillary: 104 mg/dL — ABNORMAL HIGH (ref 70–99)

## 2019-08-04 MED ORDER — OXYTOCIN 40 UNITS IN NORMAL SALINE INFUSION - SIMPLE MED
2.5000 [IU]/h | INTRAVENOUS | Status: DC
  Filled 2019-08-04: qty 1000

## 2019-08-04 MED ORDER — IBUPROFEN 600 MG PO TABS
600.0000 mg | ORAL_TABLET | Freq: Four times a day (QID) | ORAL | Status: DC
  Administered 2019-08-04 – 2019-08-06 (×5): 600 mg via ORAL
  Filled 2019-08-04 (×6): qty 1

## 2019-08-04 MED ORDER — ACETAMINOPHEN 325 MG PO TABS
650.0000 mg | ORAL_TABLET | ORAL | Status: DC | PRN
  Administered 2019-08-05: 650 mg via ORAL
  Filled 2019-08-04: qty 2

## 2019-08-04 MED ORDER — FENTANYL-BUPIVACAINE-NACL 0.5-0.125-0.9 MG/250ML-% EP SOLN
12.0000 mL/h | EPIDURAL | Status: DC | PRN
  Filled 2019-08-04: qty 250

## 2019-08-04 MED ORDER — MEASLES, MUMPS & RUBELLA VAC IJ SOLR: 0.5000 mL | Freq: Once | INTRAMUSCULAR | Status: DC

## 2019-08-04 MED ORDER — METFORMIN HCL 500 MG PO TABS
1000.0000 mg | ORAL_TABLET | Freq: Every day | ORAL | Status: DC
  Administered 2019-08-04: 1000 mg via ORAL
  Filled 2019-08-04 (×2): qty 2

## 2019-08-04 MED ORDER — ZOLPIDEM TARTRATE 5 MG PO TABS: 5.0000 mg | ORAL_TABLET | Freq: Every evening | ORAL | Status: DC | PRN

## 2019-08-04 MED ORDER — ZOLPIDEM TARTRATE 5 MG PO TABS
5.0000 mg | ORAL_TABLET | Freq: Every evening | ORAL | Status: DC | PRN
  Administered 2019-08-04: 5 mg via ORAL
  Filled 2019-08-04: qty 1

## 2019-08-04 MED ORDER — ONDANSETRON HCL 4 MG/2ML IJ SOLN: 4.0000 mg | Freq: Four times a day (QID) | INTRAMUSCULAR | Status: DC | PRN

## 2019-08-04 MED ORDER — PRENATAL MULTIVITAMIN CH
1.0000 | ORAL_TABLET | Freq: Every day | ORAL | Status: DC
  Administered 2019-08-05 – 2019-08-06 (×2): 1 via ORAL
  Filled 2019-08-04 (×2): qty 1

## 2019-08-04 MED ORDER — MISOPROSTOL 100 MCG PO TABS
25.0000 ug | ORAL_TABLET | ORAL | Status: DC | PRN
  Administered 2019-08-04 (×2): 25 ug via VAGINAL
  Filled 2019-08-04 (×3): qty 1

## 2019-08-04 MED ORDER — LABETALOL HCL 200 MG PO TABS
200.0000 mg | ORAL_TABLET | Freq: Two times a day (BID) | ORAL | Status: DC
  Administered 2019-08-04 (×2): 200 mg via ORAL
  Filled 2019-08-04 (×2): qty 1

## 2019-08-04 MED ORDER — SIMETHICONE 80 MG PO CHEW: 80.0000 mg | CHEWABLE_TABLET | ORAL | Status: DC | PRN

## 2019-08-04 MED ORDER — SODIUM CHLORIDE (PF) 0.9 % IJ SOLN
INTRAMUSCULAR | Status: DC | PRN
  Administered 2019-08-04: 12 mL/h via EPIDURAL

## 2019-08-04 MED ORDER — DIPHENHYDRAMINE HCL 50 MG/ML IJ SOLN: 12.5000 mg | INTRAMUSCULAR | Status: DC | PRN

## 2019-08-04 MED ORDER — EPHEDRINE 5 MG/ML INJ: 10.0000 mg | INTRAVENOUS | Status: DC | PRN

## 2019-08-04 MED ORDER — OXYTOCIN BOLUS FROM INFUSION: 500.0000 mL | Freq: Once | INTRAVENOUS | Status: DC

## 2019-08-04 MED ORDER — BENZOCAINE-MENTHOL 20-0.5 % EX AERO: 1.0000 "application " | INHALATION_SPRAY | CUTANEOUS | Status: DC | PRN

## 2019-08-04 MED ORDER — WITCH HAZEL-GLYCERIN EX PADS: 1.0000 "application " | MEDICATED_PAD | CUTANEOUS | Status: DC | PRN

## 2019-08-04 MED ORDER — DIPHENHYDRAMINE HCL 25 MG PO CAPS: 25.0000 mg | ORAL_CAPSULE | Freq: Four times a day (QID) | ORAL | Status: DC | PRN

## 2019-08-04 MED ORDER — LACTATED RINGERS IV SOLN: 500.0000 mL | Freq: Once | INTRAVENOUS | Status: DC

## 2019-08-04 MED ORDER — OXYCODONE-ACETAMINOPHEN 5-325 MG PO TABS: 1.0000 | ORAL_TABLET | ORAL | Status: DC | PRN

## 2019-08-04 MED ORDER — LIDOCAINE HCL (PF) 1 % IJ SOLN
INTRAMUSCULAR | Status: DC | PRN
  Administered 2019-08-04: 11 mL via EPIDURAL

## 2019-08-04 MED ORDER — LABETALOL HCL 200 MG PO TABS: 200.0000 mg | ORAL_TABLET | Freq: Two times a day (BID) | ORAL | Status: DC

## 2019-08-04 MED ORDER — DIBUCAINE (PERIANAL) 1 % EX OINT: 1.0000 "application " | TOPICAL_OINTMENT | CUTANEOUS | Status: DC | PRN

## 2019-08-04 MED ORDER — SOD CITRATE-CITRIC ACID 500-334 MG/5ML PO SOLN: 30.0000 mL | ORAL | Status: DC | PRN

## 2019-08-04 MED ORDER — ACETAMINOPHEN 325 MG PO TABS: 650.0000 mg | ORAL_TABLET | ORAL | Status: DC | PRN

## 2019-08-04 MED ORDER — ONDANSETRON HCL 4 MG PO TABS: 4.0000 mg | ORAL_TABLET | ORAL | Status: DC | PRN

## 2019-08-04 MED ORDER — LACTATED RINGERS IV SOLN: 500.0000 mL | INTRAVENOUS | Status: DC | PRN

## 2019-08-04 MED ORDER — PHENYLEPHRINE 40 MCG/ML (10ML) SYRINGE FOR IV PUSH (FOR BLOOD PRESSURE SUPPORT): 80.0000 ug | PREFILLED_SYRINGE | INTRAVENOUS | Status: DC | PRN

## 2019-08-04 MED ORDER — OXYCODONE-ACETAMINOPHEN 5-325 MG PO TABS: 2.0000 | ORAL_TABLET | ORAL | Status: DC | PRN

## 2019-08-04 MED ORDER — TETANUS-DIPHTH-ACELL PERTUSSIS 5-2.5-18.5 LF-MCG/0.5 IM SUSP: 0.5000 mL | Freq: Once | INTRAMUSCULAR | Status: DC

## 2019-08-04 MED ORDER — TERBUTALINE SULFATE 1 MG/ML IJ SOLN
0.2500 mg | Freq: Once | INTRAMUSCULAR | Status: DC | PRN
  Filled 2019-08-04: qty 1

## 2019-08-04 MED ORDER — COCONUT OIL OIL: 1.0000 "application " | TOPICAL_OIL | Status: DC | PRN

## 2019-08-04 MED ORDER — LIDOCAINE HCL (PF) 1 % IJ SOLN
30.0000 mL | INTRAMUSCULAR | Status: DC | PRN
  Filled 2019-08-04: qty 30

## 2019-08-04 MED ORDER — FLEET ENEMA 7-19 GM/118ML RE ENEM: 1.0000 | ENEMA | RECTAL | Status: DC | PRN

## 2019-08-04 MED ORDER — PANTOPRAZOLE SODIUM 40 MG PO TBEC: 40.0000 mg | DELAYED_RELEASE_TABLET | Freq: Every day | ORAL | Status: DC

## 2019-08-04 MED ORDER — SENNOSIDES-DOCUSATE SODIUM 8.6-50 MG PO TABS
2.0000 | ORAL_TABLET | ORAL | Status: DC
  Administered 2019-08-04 – 2019-08-06 (×2): 2 via ORAL
  Filled 2019-08-04 (×2): qty 2

## 2019-08-04 MED ORDER — LACTATED RINGERS IV SOLN: INTRAVENOUS | Status: DC

## 2019-08-04 MED ORDER — ONDANSETRON HCL 4 MG/2ML IJ SOLN: 4.0000 mg | INTRAMUSCULAR | Status: DC | PRN

## 2019-08-04 MED ORDER — OXYTOCIN 40 UNITS IN NORMAL SALINE INFUSION - SIMPLE MED
1.0000 m[IU]/min | INTRAVENOUS | Status: DC
  Administered 2019-08-04: 2 m[IU]/min via INTRAVENOUS

## 2019-08-04 MED ORDER — AMLODIPINE BESYLATE 5 MG PO TABS
5.0000 mg | ORAL_TABLET | Freq: Every day | ORAL | Status: DC
  Administered 2019-08-05 – 2019-08-06 (×2): 5 mg via ORAL
  Filled 2019-08-04 (×2): qty 1

## 2019-08-04 NOTE — Progress Notes (Signed)
LABOR PROGRESS NOTE  Latoya Mora is a 24 y.o. G2P1001 at [redacted]w[redacted]d  admitted for IOL d/t cHTN, elevated LFTs, and GDMA2 on metformin.  Subjective: Patient requesting epidural button for tailbone pain, otherwise doing well. No concerns. FOB at bedside.   Objective: BP (!) 99/53   Pulse 91   Temp 98.5 F (36.9 C) (Oral)   Resp 18   Ht 5\' 8"  (1.727 m)   Wt (!) 149.7 kg   LMP 11/14/2018   SpO2 99%   BMI 50.18 kg/m  or  Vitals:   08/04/19 1231 08/04/19 1301 08/04/19 1331 08/04/19 1401  BP: (!) 107/59 (!) 102/46 (!) 106/49 (!) 99/53  Pulse: 86 82 95 91  Resp:      Temp: 98.5 F (36.9 C)     TempSrc: Oral     SpO2:      Weight:      Height:        Dilation: 5 Effacement (%): 60 Cervical Position: Middle Station: -1 Presentation: Vertex Exam by:: Juleen China, DO  FHT: baseline rate 145, moderate varibility, +acel, no decel Toco: Not able to trace on toco. IUPC now in place.   Labs: Lab Results  Component Value Date   WBC 8.5 08/04/2019   HGB 12.2 08/04/2019   HCT 40.7 08/04/2019   MCV 76.4 (L) 08/04/2019   PLT 220 08/04/2019    Patient Active Problem List   Diagnosis Date Noted  . Gestational hypertension 08/04/2019  . Elevated LFTs 07/22/2019  . Gestational diabetes mellitus (GDM) controlled on oral hypoglycemic drug 06/11/2019  . Stress incontinence during pregnancy 04/21/2019  . Short interval between pregnancies affecting pregnancy, antepartum 02/11/2019  . Chronic hypertension complicating pregnancy, antepartum 04/24/2018  . Supervision of high risk pregnancy, antepartum 11/04/2017  . Dermoid cyst of left ovary 11/04/2017  . PCOS (polycystic ovarian syndrome) 10/03/2017  . Obesity, Class III, BMI 40-49.9 (morbid obesity) ([place]) 04/03/2017  . Seasonal allergies 09/18/2016  . Anxiety and depression 09/18/2016  . Frequent headaches 09/18/2016    Assessment / Plan: 24 y.o. G2P1001 at [redacted]w[redacted]d here for IOL d/t cHTN, elevated LFTs, and GDMA2 on metformin. BPs  normotensive. Last CBG 104.   Labor: AROM performed @1530  with clear fluid. IUPC now in place as was unable to trace contractions with toco. Pitocin at 63mu/min, titrate as appropriate.  Fetal Wellbeing:  Cat I  Pain Control:  Epidural in place  Anticipated MOD:  NSVD   Phill Myron, D.O. OB Fellow  08/04/2019, 2:39 PM

## 2019-08-04 NOTE — Anesthesia Preprocedure Evaluation (Signed)
Anesthesia Evaluation  Patient identified by MRN, date of birth, ID band Patient awake    Reviewed: Allergy & Precautions, H&P , NPO status , Patient's Chart, lab work & pertinent test results  Airway Mallampati: III  TM Distance: >3 FB Neck ROM: full    Dental no notable dental hx. (+) Teeth Intact   Pulmonary neg pulmonary ROS,    Pulmonary exam normal breath sounds clear to auscultation       Cardiovascular hypertension, Pt. on medications negative cardio ROS Normal cardiovascular exam Rhythm:regular Rate:Normal     Neuro/Psych Depression    GI/Hepatic negative GI ROS, Neg liver ROS,   Endo/Other  diabetesMorbid obesity  Renal/GU negative Renal ROS     Musculoskeletal   Abdominal (+) + obese,   Peds  Hematology negative hematology ROS (+)   Anesthesia Other Findings   Reproductive/Obstetrics (+) Pregnancy                             Anesthesia Physical  Anesthesia Plan  ASA: III  Anesthesia Plan: Epidural   Post-op Pain Management:    Induction:   PONV Risk Score and Plan:   Airway Management Planned:   Additional Equipment:   Intra-op Plan:   Post-operative Plan:   Informed Consent: I have reviewed the patients History and Physical, chart, labs and discussed the procedure including the risks, benefits and alternatives for the proposed anesthesia with the patient or authorized representative who has indicated his/her understanding and acceptance.       Plan Discussed with:   Anesthesia Plan Comments:         Anesthesia Quick Evaluation

## 2019-08-04 NOTE — H&P (Addendum)
LABOR AND DELIVERY ADMISSION HISTORY AND PHYSICAL NOTE  Latoya Mora is a 24 y.o. female G2P1001 with IUP at 45w0dby early ultrasound presenting for IOL 2/2 to chronic hypertension complicating pregnancy with elevated LFTs and GDMA2 on metformin. She also had these problems with her first pregnancy and was induced for the same reasons.  She reports positive fetal movement. She denies leakage of fluid, vaginal bleeding, or contractions.   She plans on breast feeding. Her contraception plan is: Nuvaring (need to discuss that this is not an option if she decides to breastfeed).  Prenatal History/Complications: [facility]  '@[redacted]w[redacted]d'$ , CWD, normal anatomy, cephalic presentation,  Placenta posterior, 80th%ile, [id number] Pregnancy complications:  - GDMA2 - Maternal Obesity - Chronic Hypertension - Elevated LFTs - Dermoid Cyst, Left Ovary  Past Medical History: Past Medical History:  Diagnosis Date   Allergy    Anemia in pregnancy 02/11/2019   CBC Latest Ref Rng & Units 06/09/2019 05/23/2019 05/03/2019 WBC 3.4 - 10.8 x10E3/uL 11.4(H) 11.5(H) 11.8(H) Hemoglobin 11.1 - 15.9 g/dL 11.4 11.2(L) 11.5(L) Hematocrit 34.0 - 46.6 % 37.0 36.1 37.0 Platelets 150 - 450 x10E3/uL 262 279 283     Anxiety    Depression    Dermoid cyst of left ovary 11/04/2017   Elevated liver enzymes    Frequent headaches    Gestational diabetes    History of pre-eclampsia 06/26/2018   Hypertension    Infection    UTI   PCOS (polycystic ovarian syndrome)     Past Surgical History: Past Surgical History:  Procedure Laterality Date   NO PAST SURGERIES     TOOTH EXTRACTION      Obstetrical History: OB History     Gravida  2   Para  1   Term  1   Preterm  0   AB  0   Living  1      SAB  0   TAB  0   Ectopic  0   Multiple  0   Live Births  1           Social History: Social History   Socioeconomic History   Marital status: Married    Spouse name: JCecelia Graciano  Number of  children: Not on file   Years of education: Not on file   Highest education level: Not on file  Occupational History   Not on file  Tobacco Use   Smoking status: Never Smoker   Smokeless tobacco: Never Used  Substance and Sexual Activity   Alcohol use: Not Currently   Drug use: No   Sexual activity: Yes    Birth control/protection: None  Other Topics Concern   Not on file  Social History Narrative   Not on file   Social Determinants of Health   Financial Resource Strain:    Difficulty of Paying Living Expenses: Not on file  Food Insecurity:    Worried About Running Out of Food in the Last Year: Not on file   RYRC Worldwideof Food in the Last Year: Not on file  Transportation Needs:    Lack of Transportation (Medical): Not on file   Lack of Transportation (Non-Medical): Not on file  Physical Activity:    Days of Exercise per Week: Not on file   Minutes of Exercise per Session: Not on file  Stress:    Feeling of Stress : Not on file  Social Connections:    Frequency of Communication with Friends and  Family: Not on file   Frequency of Social Gatherings with Friends and Family: Not on file   Attends Religious Services: Not on file   Active Member of Clubs or Organizations: Not on file   Attends Archivist Meetings: Not on file   Marital Status: Not on file    Family History: Family History  Problem Relation Age of Onset   Depression Mother    Anxiety disorder Mother    Heart disease Mother    Skin cancer Mother    Scoliosis Mother    Alcohol abuse Father    Bipolar disorder Father    Depression Father    Osteochondroma Father    COPD Maternal Grandmother    Scoliosis Maternal Grandmother    COPD Maternal Grandfather    Skin cancer Maternal Grandfather    Parkinson's disease Maternal Grandfather    Arthritis Paternal Grandmother    Diabetes Paternal Grandmother    Bipolar disorder Paternal Grandfather    Osteochondroma Sister    Osteochondroma Brother     Cancer Neg Hx     Allergies: Allergies  Allergen Reactions   Etodolac Nausea Only   Meloxicam Other (See Comments)    Abdominal pain   Flexeril [Cyclobenzaprine] Nausea Only   Nsaids Other (See Comments)    Stomach pain   Prozac [Fluoxetine Hcl] Other (See Comments)    Felt bad/awful    Medications Prior to Admission  Medication Sig Dispense Refill Last Dose   ACCU-CHEK GUIDE test strip USE TO CHECK BLOOD SUGARS FOUR TIMES A DAY WAS INSTRUCTED 50 strip 12    Accu-Chek Softclix Lancets lancets Use as instructed 100 each 12    aspirin EC 81 MG tablet Take 2 tablets (162 mg total) by mouth daily. 180 tablet 3    Blood Glucose Monitoring Suppl (ACCU-CHEK GUIDE) w/Device KIT 1 Device by Does not apply route 4 (four) times daily. 1 kit 0    butalbital-acetaminophen-caffeine (FIORICET) 50-325-40 MG tablet Take 1-2 tablets by mouth every 6 (six) hours as needed for headache.      labetalol (NORMODYNE) 200 MG tablet TAKE 1 TABLET BY MOUTH TWICE A DAY (Patient taking differently: Take 200 mg by mouth 2 (two) times daily. ) 60 tablet 3    metFORMIN (GLUCOPHAGE) 1000 MG tablet 1 tab po with snack before bedtime. (Patient taking differently: Take 1,000 mg by mouth at bedtime. 1 tab po with snack before bedtime.) 30 tablet 2    omeprazole (PRILOSEC OTC) 20 MG tablet Take 1 tablet (20 mg total) by mouth daily. 30 tablet 3    Prenatal Vit-Fe Fumarate-FA (PRENATAL MULTIVITAMIN) TABS tablet Take 1 tablet by mouth daily at 12 noon.      Propranolol HCl (INDERAL PO) Take by mouth.        Review of Systems  All systems reviewed and negative except as stated in HPI  Physical Exam Blood pressure (!) 125/96, pulse 95, resp. rate 18, height '5\' 8"'$  (1.727 m), weight (!) 149.7 kg, last menstrual period 11/14/2018, unknown if currently breastfeeding. General appearance: alert, oriented, NAD Lungs: normal respiratory effort Heart: regular rate Abdomen: soft, non-tender; gravid Extremities: No calf swelling  or tenderness Presentation: cephalic by cervical exam  Fetal monitoring: Baseline: 150 bpm, Variability: Good {> 6 bpm), Accelerations: Reactive and Decelerations: Absent Uterine activity: Occasional contractions, non-painful     Prenatal labs: ABO, Rh: --/--/B POS, B POS Performed at [facility] [address], [place], [place] [address]  [phone]) Antibody:  NEG (01/27 0537) Rubella: 1.01 (08/17 1408) RPR: Non Reactive (12/15 0813)  HBsAg: Negative (08/17 1408)  HIV: Non Reactive (12/15 0813)  GC/Chlamydia: Negative GBS:   Negative 2-hr GTT: Negative Genetic screening:  WNL Anatomy US: Normal EFW 80% on 07/31/19 Prenatal Transfer Tool  Maternal Diabetes: No Genetic Screening: Normal Maternal Ultrasounds/Referrals: Normal Fetal Ultrasounds or other Referrals:  None Maternal Substance Abuse:  No Significant Maternal Medications:  None Significant Maternal Lab Results: Group B Strep negative  No results found for this or any previous visit (from the past 24 hour(s)).  Patient Active Problem List   Diagnosis Date Noted   Gestational hypertension 08/04/2019   Elevated LFTs 07/22/2019   Gestational diabetes mellitus (GDM) controlled on oral hypoglycemic drug 06/11/2019   Stress incontinence during pregnancy 04/21/2019   Short interval between pregnancies affecting pregnancy, antepartum 02/11/2019   Chronic hypertension complicating pregnancy, antepartum 04/24/2018   Supervision of high risk pregnancy, antepartum 11/04/2017   Dermoid cyst of left ovary 11/04/2017   PCOS (polycystic ovarian syndrome) 10/03/2017   Obesity, Class III, BMI 40-49.9 (morbid obesity) ([place]) 04/03/2017   Seasonal allergies 09/18/2016   Anxiety and depression 09/18/2016   Frequent headaches 09/18/2016    Assessment:  Latoya Mora is a 24 y.o. female G2P1001 with IUP at 21w0dby early ultrasound presenting for IOL 2/2 to chronic hypertension complicating pregnancy with elevated LFTs and  GDMA2 on metformin.  #Labor: Cytotec started @ 0049.  Cervix is still unfavorable, will ripen further before starting Pitocin.  Patient specifically requests to avoid FB if possible. #Fetal Wellbeing:  Category I #Pain Control: Epidural #GBS/ID: Negative   #COVID: negative 2/6 #MOF: Breast   #MOC: Nuvaring (will need to discuss that this is not compatible with breast-feeding) #Circ: Outpatient #Anticipated MOD: NSVD #GDMA2: CBG q4 hours in latent labor, q2hrs active labor Metformin tonight only as she will likely deliver before her next dose is due tomorrow p.m. Carb modified diet. #Eleavated LFTs: CMP ordered and pending #Chronic Hypertension: Home dose of labetalol '200mg'$  twice daily continued #Anxiety: Patient states that she would like to sleep, will provide '5mg'$  aRodman Pickle DO, PGY-1 Family Medicine Resident, [facility]En[facility] ColumbiaFaculty Teaching Service  08/04/2019, 12:35 AM  I personally saw and evaluated the patient, performing the key elements of the service. I developed and verified the management plan that is described in the resident's/student's note, and I agree with the content with my edits above. FNigel Berthold CNM 08/04/2019 7:31 AM

## 2019-08-04 NOTE — Anesthesia Procedure Notes (Signed)
Epidural Patient location during procedure: OB Start time: 08/04/2019 10:20 AM End time: 08/04/2019 10:34 AM  Staffing Anesthesiologist: Lynda Rainwater, MD Performed: anesthesiologist   Preanesthetic Checklist Completed: patient identified, IV checked, site marked, risks and benefits discussed, surgical consent, monitors and equipment checked, pre-op evaluation and timeout performed  Epidural Patient position: sitting Prep: ChloraPrep Patient monitoring: heart rate, cardiac monitor, continuous pulse ox and blood pressure Approach: midline Location: L2-L3 Injection technique: LOR saline  Needle:  Needle type: Tuohy  Needle gauge: 17 G Needle length: 9 cm Needle insertion depth: 7 cm Catheter type: closed end flexible Catheter size: 20 Guage Catheter at skin depth: 12 cm Test dose: negative  Assessment Events: blood not aspirated, injection not painful, no injection resistance, no paresthesia and negative IV test  Additional Notes Reason for block:procedure for pain

## 2019-08-04 NOTE — Progress Notes (Signed)
Labor Progress Note Latoya Mora is a 24 y.o. G2P1001 at [redacted]w[redacted]d presented for IOL d/t cHTN, elevated LFTs, and GDMA2 on metformin. S: Patient resting comfortably in bed s/p epidural  O:  BP 128/63   Pulse 94   Temp 98.7 F (37.1 C) (Oral)   Resp 18   Ht 5\' 8"  (1.727 m)   Wt (!) 149.7 kg   LMP 11/14/2018   SpO2 99%   BMI 50.18 kg/m  EFM: 140 bpm/+accels, moderate variability/-decels  CVE: Dilation: 3 Effacement (%): 50 Cervical Position: Middle Station: -2 Presentation: Vertex Exam by:: J.Cox, RN   A&P: 23 y.o. G2P1001 [redacted]w[redacted]d here for IOL d/t cHTN, elevated LFTs, and GDMA2 on metformin. #Labor: Progressing well. Most recent CVE with dilation to 3, FB not needed. Starting low dose pitocin. #Pain: Epidural #FWB: Cat I #GBS negative #cHTN: Continue labetalol 200mg  BID. Currently asymptomatic. Most recent BP's normotensive 128/63, mildly elevated ~9 AM 141/95. #GDMA2: Most recent CBG 114. Continue to monitor q4hr in latent labor.  Gladys Damme, MD 10:32 AM

## 2019-08-05 ENCOUNTER — Telehealth: Payer: Self-pay | Admitting: Radiology

## 2019-08-05 LAB — GLUCOSE, CAPILLARY: Glucose-Capillary: 90 mg/dL (ref 70–99)

## 2019-08-05 MED ORDER — TRAMADOL HCL 50 MG PO TABS
50.0000 mg | ORAL_TABLET | Freq: Four times a day (QID) | ORAL | Status: DC | PRN
  Administered 2019-08-05 – 2019-08-06 (×4): 50 mg via ORAL
  Filled 2019-08-05 (×4): qty 1

## 2019-08-05 NOTE — Progress Notes (Signed)
Post Partum Day 1 Subjective: no complaints, up ad lib, voiding, tolerating PO and + flatus  Objective: Blood pressure 121/70, pulse 90, temperature 97.6 F (36.4 C), temperature source Oral, resp. rate 18, height 5\' 8"  (1.727 m), weight (!) 149.7 kg, last menstrual period 11/14/2018, SpO2 99 %, unknown if currently breastfeeding.  Physical Exam:  General: alert, cooperative, appears stated age, no distress and morbidly obese Lochia: appropriate Uterine Fundus: firm Incision: n/a DVT Evaluation: No evidence of DVT seen on physical exam. Negative Homan's sign. No cords or calf tenderness. No significant calf/ankle edema.  Recent Labs    08/04/19 0100 08/04/19 0941  HGB 11.7* 12.2  HCT 39.3 40.7    Assessment/Plan: Plan for discharge tomorrow, Breastfeeding, Lactation consult and Contraception nuvaring   LOS: 1 day   [facility] 08/05/2019, 8:28 AM

## 2019-08-05 NOTE — Telephone Encounter (Signed)
Left message on voicemail with PP appointment information and BP check

## 2019-08-05 NOTE — Lactation Note (Signed)
This note was copied from a baby's chart. Lactation Consultation Note Baby is 62 hrs old. Having issues w/glucose up and down. Has been sleepy, not very aggressive w/BF. Has had to supplement w/formula to get glucose up as well as gel given. Mom has GDM, PCOS. Great everted nipples. Mom stated she has been hand expressing a little colostrum rubbing it in baby's mouth. Mom asked RN for DEBP to be set up for mom to go ahead and stimulate milk. Mom has pumped w/no colostrum noted. Explained normal. Encouraged to hand express after pumping and pump every 3 hrs. [place] collected a few drops. Assessed baby's suck. Suckle is very tight. Massaged cheeks to relax baby. Loosened only a little. Baby had thick labial frenulum, noted when crying while LC changing diaper baby has visible frenulum and heart shape tongue. Mom stated her 1st child has the same issues as labial frenulum and has a heart shaped tongue. Mom stated she never could get him latched well. Mom stated she had to give formula as well that she could never pump enough. Baby's glucose come back 44. Discussed w/mom since it's up and down, mom might should cont. To supplement after next few feedings to get him stabilized well. Newborn feeding habits, STS, I&O, breast massage, milk storage, positioning, support, safety, supply and demand discussed. Gave mom supplemental guideline information sheet. Baby placed in football position. Latch great and fed well. Mom denies painful latches. Mom excited how well baby fed. Mom stated her 1st child didn't feed that well. Encouraged mom to call for assistance or questions. Lactation brochure given.   Patient Name: Latoya Mora [id number] Date: 08/05/2019 Reason for consult: Initial assessment;Early term 37-38.6wks;Maternal endocrine disorder Type of Endocrine Disorder?: PCOS(GDM)   Maternal Data Has patient been taught Hand Expression?: Yes Does the patient have breastfeeding experience prior to this  delivery?: Yes  Feeding Feeding Type: Breast Fed  LATCH Score Latch: Grasps breast easily, tongue down, lips flanged, rhythmical sucking.  Audible Swallowing: None  Type of Nipple: Everted at rest and after stimulation  Comfort (Breast/Nipple): Soft / non-tender  Hold (Positioning): Assistance needed to correctly position infant at breast and maintain latch.  LATCH Score: 7  Interventions Interventions: Breast feeding basics reviewed;Support pillows;Assisted with latch;Position options;Skin to skin;Expressed milk;Breast massage;Hand express;Breast compression;Adjust position;DEBP  Lactation Tools Discussed/Used Tools: Pump Breast pump type: Double-Electric Breast Pump WIC Program: Yes Pump Review: Setup, frequency, and cleaning;Milk Storage Initiated by:: RN Date initiated:: 08/04/19   Consult Status Consult Status: Follow-up Date: 08/05/19(in pm) Follow-up type: In-patient    Luccia Reinheimer, Elta [place] 08/05/2019, 4:02 AM

## 2019-08-05 NOTE — Progress Notes (Signed)
CSW received consult for hx of Anxiety. PPD, and Depression.  CSW met with MOB to offer support and complete assessment.     CSW entered the room and congratulated MOB on the birth of infant. CSW advised MOB of the reason for the visit as well as CSW's role. MOB was very welcoming and willing to speak with CSW at this time. MOB reported that she was diagnosed with depression and  anxiety around the age of 12-13. MOB reports that she has been on medications for a great deal of time for her anxiety and her depression. MOB reports that during her pregnancy anxiety and depression was "managable" but MOB did report that she dealt with it during her pregnancy. MOB reports that she is interested in getting back on her medications as she stopped them once she found out that she was pregnant. MOB reports that she was diagnosed with PPD after the birth of her first child January 202. MOB reported that she spoke with her OB as she noticed that things were not right with her and that she wasn't feeling like herself. MOB reports that is aware of her triggers and signs of PPD.   CSW inquired from MOB on therapy. MOB expressed being in therapy in the past, however nothing recent. MOB reported that she is interested in therapy and has the ability to get therapist if needed. CSW was notified  by MOB that she has all needed items to care for infant with no other needs at this time.   CSW provided education regarding the baby blues period vs. perinatal mood disorders, discussed treatment and gave resources for mental health follow up if concerns arise.  CSW recommends self-evaluation during the postpartum time period using the New Mom Checklist from Postpartum Progress and encouraged MOB to contact a medical professional if symptoms are noted at any time.   CSW provided review of Sudden Infant Death Syndrome (SIDS) precautions.   CSW identifies no further need for intervention and no barriers to discharge at this  time.    Latoya Mora, MSW, LCSW [facility] [phone]   

## 2019-08-05 NOTE — Lactation Note (Addendum)
This note was copied from a baby's chart. Lactation Consultation Note  Patient Name: Latoya Mora [id number] Date: 08/05/2019 Reason for consult: Follow-up assessment;Early term 37-38.6wks;Infant weight loss;Other (Comment);Maternal endocrine disorder(2 % weight loss, restricted lingual tongue movement - short - see LC note) Type of Endocrine Disorder?: PCOS  Previous LC reported to this [place] she noted a heart shaped tongue short , decreased tongue mobility and short anterior frenulum.  Mom mentioned her 1st baby had the same .  [place] asked mom to have the [place] doctor to check .  Baby awake and hungry, LC set up the 5 F SNS and assisted mom to latch with depth 1st and then showed dad how to insert the 5 F SNS tube in the side of the baby's mouth. Baby nurse for 8 mins and took 8 ml from the SNS .  The curved tip breast feeding tool had been use earlier and this LC felt it  was important to supplement the baby at the breast to enhance him opening his mouth wider and enhance let down. Due to moms hx of low milk supply and lingual mobility issue the supplementing is indicated and earlier low blood sugar.   LC assessed tongue mobility - upper lip stretches well with exam and latch,  [place] noted under the mid section of the tongue short distance back from the tip.  When the baby crys the tongue appears heart shape and the baby unable to stretch tongue over gum line of raise above the corners of the mouth.   With latch per mom comfortable.    LC Plan:  LC recommended shells between feedings except when sleeping ,  Feed with feeding cues ( 8-12 times in 24 hours )  With firn support - latch and have dad insert the 5 F SNS in the side of the mouth and once some resistance and the baby is pulling the milk down from the tube its working.  For today - start with 8-12 ml of EBM or formula.  After baby settled - post pump both breast for 15 -20 mins , save milk for the next feeding and hand expressing.   [place]  reviewed with mom and dad how to clean the 5 F SNS.  MBURN Leandrew Koyanagi plans to help mom with shells.      Maternal Data Has patient been taught Hand Expression?: Yes  Feeding Feeding Type: Breast Milk with Formula added  LATCH Score Latch: Repeated attempts needed to sustain latch, nipple held in mouth throughout feeding, stimulation needed to elicit sucking reflex.  Audible Swallowing: Spontaneous and intermittent  Type of Nipple: Everted at rest and after stimulation  Comfort (Breast/Nipple): Soft / non-tender  Hold (Positioning): Assistance needed to correctly position infant at breast and maintain latch.  LATCH Score: 8  Interventions Interventions: Breast feeding basics reviewed;Assisted with latch;Skin to skin;Breast massage;Hand express;Breast compression;Adjust position;Support pillows;Position options;Reverse pressure;Shells;DEBP  Lactation Tools Discussed/Used Tools: Pump;Shells;57F feeding tube / Syringe(RN plans to help mom with shells) Shell Type: Inverted Breast pump type: Double-Electric Breast Pump   Consult Status Consult Status: Follow-up Date: 08/06/19 Follow-up type: In-patient    [place] 08/05/2019, 9:09 AM

## 2019-08-05 NOTE — Anesthesia Postprocedure Evaluation (Signed)
Anesthesia Post Note  Patient: Latoya Mora  Procedure(s) Performed: AN AD HOC LABOR EPIDURAL     Patient location during evaluation: [facility] Anesthesia Type: Epidural Level of consciousness: awake and alert Pain management: pain level controlled Vital Signs Assessment: post-procedure vital signs reviewed and stable Respiratory status: spontaneous breathing, nonlabored ventilation and respiratory function stable Cardiovascular status: stable Postop Assessment: no headache, no backache and epidural receding Anesthetic complications: no    Last Vitals:  Vitals:   08/04/19 2348 08/05/19 0345  BP: 96/67 121/70  Pulse: (!) 101 90  Resp: 18 18  Temp: 36.7 C 36.4 C  SpO2:      Last Pain:  Vitals:   08/05/19 0828  TempSrc:   PainSc: 5    Pain Goal: Patients Stated Pain Goal: 2 (08/05/19 [id number])                 Gilmer Mor

## 2019-08-06 MED ORDER — AMLODIPINE BESYLATE 5 MG PO TABS: 5.0000 mg | ORAL_TABLET | Freq: Every day | ORAL | 2 refills | Status: DC

## 2019-08-06 MED ORDER — IBUPROFEN 600 MG PO TABS: 600.0000 mg | ORAL_TABLET | Freq: Four times a day (QID) | ORAL | 0 refills | Status: DC

## 2019-08-06 MED ORDER — ACETAMINOPHEN 325 MG PO TABS: 650.0000 mg | ORAL_TABLET | ORAL | 1 refills | Status: DC | PRN

## 2019-08-06 NOTE — Discharge Summary (Addendum)
Postpartum Discharge Summary  Date of Service updated 08/06/2019     Patient Name: Latoya Mora DOB: 09-02-1995 MRN: [id number]  Date of admission: 08/04/2019 Delivering Provider: Nicolette Bang   Date of discharge: 08/06/2019  Admitting diagnosis: Gestational hypertension [O13.9] Intrauterine pregnancy: [redacted]w[redacted]d    Secondary diagnosis:  Active Problems:   Anxiety and depression   PCOS (polycystic ovarian syndrome)   Chronic hypertension complicating pregnancy, antepartum   Short interval between pregnancies affecting pregnancy, antepartum   Gestational diabetes mellitus (GDM) controlled on oral hypoglycemic drug   Elevated LFTs   Gestational hypertension  Additional problems: None     Discharge diagnosis: Term Pregnancy Delivered, CHTN and GDM A2                                                                                                Post partum procedures:None  Augmentation: AROM, Pitocin and Cytotec  Complications: None  Hospital course:  Induction of Labor With Vaginal Delivery   24y.o. yo G2P2002 at 352w0das admitted to the hospital 08/04/2019 for induction of labor.  Indication for induction: A2 DM and cHTN, Elevated LFTs.  Patient had an uncomplicated labor course as follows: Membrane Rupture Time/Date: 2:30 PM ,08/04/2019   Intrapartum Procedures: Episiotomy: None [1]                                         Lacerations:  None [1]  Patient had delivery of a Viable infant.  Information for the patient's newborn:  Latoya Mora, Latoya Mora[[id number]]Delivery Method: Vaginal, Spontaneous(Filed from Delivery Summary)    08/04/2019  Details of delivery can be found in separate delivery note. She was started on Norvasc 5g to start PPD#1.  Patient had a routine postpartum course, including normotensive BPs. PPD#1 FBS was 9072mL. Patient is discharged home 08/06/19- we will send her home with the Norvasc and pt will follow values closely as BPs appear to be low end of  normal with medication. Delivery time: 4:43 PM    Magnesium Sulfate received: No BMZ received: No Rhophylac:N/A MMR:N/A Transfusion:No  Physical exam  Vitals:   08/05/19 1209 08/05/19 1455 08/05/19 2112 08/06/19 0545  BP: 114/67 114/82 98/72 (!) 91/51  Pulse: 89 96 89 87  Resp: _0 Temp: 98.8 F (37.1 C) 98.6 F (37 C) 98 F (36.7 C) 99 F (37.2 C)  TempSrc: Oral Oral Axillary Oral  SpO2: 99% 99% 96% 98%  Weight:      Height:       General: alert Lochia: appropriate Uterine Fundus: firm Incision: Healing well with no significant drainage DVT Evaluation: No evidence of DVT seen on physical exam. Negative Homan's sign. No cords or calf tenderness. No significant calf/ankle edema. Labs: Lab Results  Component Value Date   WBC 8.5 08/04/2019   HGB 12.2 08/04/2019   HCT 40.7 08/04/2019   MCV 76.4 (L) 08/04/2019   PLT 220 08/04/2019   CMP Latest Ref Rng & Units 08/04/2019  Glucose 70 - 99 mg/dL 102(H)  BUN 6 - 20 mg/dL 5(L)  Creatinine 0.44 - 1.00 mg/dL 0.51  Sodium 135 - 145 mmol/L 134(L)  Potassium 3.5 - 5.1 mmol/L 4.1  Chloride 98 - 111 mmol/L 103  CO2 22 - 32 mmol/L 21(L)  Calcium 8.9 - 10.3 mg/dL 8.9  Total Protein 6.5 - 8.1 g/dL 6.1(L)  Total Bilirubin 0.3 - 1.2 mg/dL 0.5  Alkaline Phos 38 - 126 U/L 373(H)  AST 15 - 41 U/L 31  ALT 0 - 44 U/L 51(H)   [name] Score: Edinburgh Postnatal Depression Scale Screening Tool 08/05/2019  I have been able to laugh and see the funny side of things. 0  I have looked forward with enjoyment to things. 0  I have blamed myself unnecessarily when things went wrong. 0  I have been anxious or worried for no good reason. 2  I have felt scared or panicky for no good reason. 2  Things have been getting on top of me. 1  I have been so unhappy that I have had difficulty sleeping. 0  I have felt sad or miserable. 0  I have been so unhappy that I have been crying. 0  The thought of harming myself has occurred to me. 0   Edinburgh Postnatal Depression Scale Total 5    Discharge instruction: per After Visit Summary and "Baby and Me Booklet".  After visit meds:  Allergies as of 08/06/2019      Reactions   Etodolac Nausea Only   Meloxicam Other (See Comments)   Abdominal pain   Flexeril [cyclobenzaprine] Nausea Only   Nsaids Other (See Comments)   Stomach pain. Pt ok with Motrin   Prozac [fluoxetine Hcl] Other (See Comments)   Felt bad/awful      Medication List    STOP taking these medications   Accu-Chek Guide test strip Generic drug: glucose blood   Accu-Chek Guide w/Device Kit   Accu-Chek Softclix Lancets lancets   aspirin EC 81 MG tablet   butalbital-acetaminophen-caffeine 50-325-40 MG tablet Commonly known as: FIORICET   INDERAL PO   labetalol 200 MG tablet Commonly known as: NORMODYNE   metFORMIN 1000 MG tablet Commonly known as: Glucophage   omeprazole 20 MG tablet Commonly known as: PriLOSEC OTC   prenatal multivitamin Tabs tablet     TAKE these medications   acetaminophen 325 MG tablet Commonly known as: Tylenol Take 2 tablets (650 mg total) by mouth every 4 (four) hours as needed (for pain scale < 4).   amLODipine 5 MG tablet Commonly known as: NORVASC Take 1 tablet (5 mg total) by mouth daily.   ibuprofen 600 MG tablet Commonly known as: ADVIL Take 1 tablet (600 mg total) by mouth every 6 (six) hours.       Diet: routine diet  Activity: Advance as tolerated. Pelvic rest for 6 weeks.   Outpatient follow up: BP Check on 08/18/2019 and Outpatient follow up on 09/08/2019  Follow up Appt: Future Appointments  Date Time Provider [place]  08/18/2019 10:00 AM [facility] NURSE [facility]  09/08/2019 10:00 AM Aletha Halim, MD [facility]   Follow up Visit:    Please schedule this patient for Postpartum visit in: 4 weeks with the following provider: Any provider In-Person For C/S patients schedule nurse incision check in  weeks 2 weeks: no High risk pregnancy complicated by: HTN Delivery mode:  SVD Anticipated Birth Control:  considering Nuvaring but open to discussion PP Procedures needed: BP check  Schedule Integrated BH visit: no     Newborn Data: Live born female  Birth Weight: 6 lb 13.9 oz (3115 g) APGAR: 4, 8  Newborn Delivery   Birth date/time: 08/04/2019 16:43:00 Delivery type: Vaginal, Spontaneous      Baby Feeding: Breast Disposition:home with mother   08/06/2019 Pollyann Samples, DO   CNM attestation I have seen and examined this patient and agree with above documentation in the resident's note.   Madalina Rosman is a 24 y.o. G2P2002 s/p vag del.   Pain is well controlled.  Plan for birth control is POPs vs Nexplanon.  Method of Feeding: breast  PE:  BP 124/74   Pulse 87   Temp 99 F (37.2 C) (Oral)   Resp 16   Ht 5' 8" (1.727 m)   Wt (!) 149.7 kg   LMP 11/14/2018   SpO2 98%   Breastfeeding Unknown   BMI 50.18 kg/m  Fundus firm  Recent Labs    08/04/19 0100 08/04/19 0941  HGB 11.7* 12.2  HCT 39.3 40.7     Plan: discharge today - postpartum care discussed - f/u clinic 1wk BP check and in 4 weeks for postpartum visit with GTT   Myrtis Ser, CNM 9:03 PM  08/06/2019

## 2019-08-06 NOTE — Lactation Note (Signed)
This note was copied from a baby's chart. Lactation Consultation Note  Patient Name: Latoya Mora [id number] Date: 08/06/2019 Reason for consult: Follow-up assessment;Early term 37-38.6wks;Infant weight loss;Maternal endocrine disorder;Other (Comment)(weight loss 8 %.) Type of Endocrine Disorder?: PCOS - baby has decreased tongue mobility.  Baby is 19 hours old, dad and mom updated the [place] with I/O's.  Per mom getting more milk with hand expressing compared to pumping.  [place] reassured mom that is normal and expected in early breast feeding.  Mom mentioned her nipples are feeling sore and breast are achy , heavier.  LC reassured mom that is a good sign that her milk is coming in.  Mom mentioned the SNS worked some and then also felt overwhelming.  Mom expressed she preferred the bottle to supplement.   LC reviewed sore nipple and engorgement prevention and tx.  LC recommended due to the sore nipples , 8 % weight loss to offered the breast with feeding cues and by 3 hours. Feed the baby for 15 -20 mins - 30 mins max and supplement afterwards 30 ml , if needed increase to 45 ml. As the milk comes in the supplementing can decrease to 30 ml and if the weight is increasing.  After breast feeding post pump both breast for 15 -20 mins / save milk for the next feeding.  Per mom has a DEBP at home and may mentioned getting a stronger DEBP.  LC provided a Tongue - tie resources in the area for Tongue mobility assessment.     Maternal Data Has patient been taught Hand Expression?: Yes(per mom gets more with hand expressing than pumping)  Feeding Feeding Type: (per mom baby has recently fed - breast fed)  LATCH Score                   Interventions Interventions: Breast feeding basics reviewed  Lactation Tools Discussed/Used Tools: Shells;Pump Shell Type: Inverted Breast pump type: Double-Electric Breast Pump Pump Review: Milk Storage   Consult Status Consult Status: Complete(see  LC note) Date: 08/06/19    Jerlyn Ly Dhanya Bogle 08/06/2019, 11:34 AM

## 2019-08-06 NOTE — Discharge Instructions (Signed)

## 2019-08-07 ENCOUNTER — Ambulatory Visit: Payer: Self-pay

## 2019-08-07 NOTE — Lactation Note (Signed)
This note was copied from a baby's chart. Lactation Consultation Note  Patient Name: Latoya Mora [id number] Date: 08/07/2019    Baby 41 hours old.  37 weeks.  Weight stabilized.   Mother is breastfeeding, pumping and supplementing with breastmilk and the difference with formula. Baby cluster fed last night. Provided education on increasing supplementation per day of life and as baby desires. Feed on demand with cues.  Goal 8-12+ times per day after first 24 hrs.  Place baby STS if not cueing.  Reviewed engorgement care and monitoring voids/stools. Mother has DEBP at home.    Maternal Data    Feeding    LATCH Score                   Interventions    Lactation Tools Discussed/Used     Consult Status      Vivianne Master [facility] 08/07/2019, 10:07 AM

## 2019-08-12 DIAGNOSIS — F329 Major depressive disorder, single episode, unspecified: Secondary | ICD-10-CM

## 2019-08-12 DIAGNOSIS — F32A Depression, unspecified: Secondary | ICD-10-CM

## 2019-08-12 DIAGNOSIS — F419 Anxiety disorder, unspecified: Secondary | ICD-10-CM

## 2019-08-12 MED ORDER — VENLAFAXINE HCL ER 75 MG PO CP24: 75.0000 mg | ORAL_CAPSULE | Freq: Every day | ORAL | 3 refills | Status: DC

## 2019-08-20 ENCOUNTER — Encounter: Payer: Self-pay | Admitting: *Deleted

## 2019-09-08 ENCOUNTER — Ambulatory Visit: Payer: BC Managed Care – PPO | Admitting: Obstetrics and Gynecology

## 2019-09-12 ENCOUNTER — Other Ambulatory Visit: Payer: Self-pay | Admitting: Obstetrics & Gynecology

## 2019-09-12 DIAGNOSIS — F329 Major depressive disorder, single episode, unspecified: Secondary | ICD-10-CM

## 2019-09-12 DIAGNOSIS — F32A Depression, unspecified: Secondary | ICD-10-CM

## 2019-09-12 DIAGNOSIS — F419 Anxiety disorder, unspecified: Secondary | ICD-10-CM

## 2019-09-17 ENCOUNTER — Telehealth: Payer: Self-pay | Admitting: Radiology

## 2019-09-17 ENCOUNTER — Encounter: Payer: Self-pay | Admitting: Family Medicine

## 2019-09-17 ENCOUNTER — Ambulatory Visit: Payer: Medicaid Other | Admitting: Family Medicine

## 2019-09-17 NOTE — Telephone Encounter (Signed)
Called patient for missed appointment for Postpartum appointment and GTT. Left message for patient to call office to reschedule.

## 2019-09-17 NOTE — Progress Notes (Signed)
Patient did not keep appointment today. She will be called to reschedule.  

## 2019-09-17 NOTE — Progress Notes (Deleted)
    [place] Partum Visit Note  Latoya Mora is a 24 y.o. G76P2002 female who presents for a postpartum visit. She is {1-10:13787} {time; units:18646} postpartum following a {method of delivery:313099}.  I have fully reviewed the prenatal and intrapartum course. The delivery was at *** gestational weeks.  Anesthesia: {anesthesia types:812}. Postpartum course has been ***. Baby is doing well***. Baby is feeding by {breast/bottle:69}. Bleeding {vag bleed:12292}. Bowel function is {normal:32111}. Bladder function is {normal:32111}. Patient {is/is not:9024} sexually active. Contraception method is {contraceptive method:5051}. Postpartum depression screening: {gen negative/positive:315881}.  {Common ambulatory SmartLinks:19316}  Review of Systems {ros; complete:30496}    Objective:  unknown if currently breastfeeding.  General:  {gen appearance:16600}   Breasts:  {breast exam:1202::"inspection negative, no nipple discharge or bleeding, no masses or nodularity palpable"}  Lungs: {lung exam:16931}  Heart:  {heart exam:5510}  Abdomen: {abdomen exam:16834}   Vulva:  {labia exam:12198}  Vagina: {vagina exam:12200}  Cervix:  {cervix exam:14595}  Corpus: {uterus exam:12215}  Adnexa:  {adnexa exam:12223}  Rectal Exam: {rectal/vaginal exam:12274}        Assessment:    *** postpartum exam. Pap smear {done:10129} at today's visit.   Plan:   Essential components of care per ACOG recommendations:  1.  Mood and well being: Patient with {gen negative/positive:315881} depression screening today. Reviewed local resources for support.  - Patient {Action; does/does not:19097} use tobacco. ***If using tobacco we discussed reduction and for recently cessation risk of relapse - hx of drug use? {yes/no:20286}  *** If yes, discussed support systems  2. Infant care and feeding:  -Patient currently breastmilk feeding? {yes/no:20286} ***If breastmilk feeding discussed return to work and pumping. If needed,  patient was provided letter for work to allow for every 2-3 hr pumping breaks, and to be granted a private location to express breastmilk and refrigerated area to store breastmilk. Reviewed importance of draining breast regularly to support lactation. -Social determinants of health (SDOH) reviewed in EPIC. No concerns***The following needs were identified***  3. Sexuality, contraception and birth spacing - Patient {DOES_DOES JZ:4998275 want a pregnancy in the next year.  Desired family size is {NUMBER 1-10:22536} children.  - Reviewed forms of contraception in tiered fashion. Patient desired {PLAN CONTRACEPTION:313102} today.   - Discussed birth spacing of 18 months  4. Sleep and fatigue -Encouraged family/partner/community support of 4 hrs of uninterrupted sleep to help with mood and fatigue  5. Physical Recovery  - Discussed patients delivery*** and complications - Patient had a *** degree laceration, perineal healing reviewed. Patient expressed understanding - Patient has urinary incontinence? {yes/no:20286}*** Patient was referred to pelvic floor PT  - Patient {ACTION; IS/IS GI:087931 safe to resume physical and sexual activity  6.  Health Maintenance - Last pap smear done *** and was {Normal/abnormal wildcard:19619} with negative HPV. ***Mammogram  7. ***Chronic Disease - PCP follow up  [place], [place] for [place]

## 2019-10-04 ENCOUNTER — Other Ambulatory Visit: Payer: Self-pay | Admitting: Family Medicine

## 2019-12-24 ENCOUNTER — Ambulatory Visit (INDEPENDENT_AMBULATORY_CARE_PROVIDER_SITE_OTHER): Payer: Medicaid Other | Admitting: Obstetrics & Gynecology

## 2019-12-24 ENCOUNTER — Other Ambulatory Visit: Payer: Self-pay

## 2019-12-24 ENCOUNTER — Encounter: Payer: Self-pay | Admitting: Obstetrics & Gynecology

## 2019-12-24 VITALS — BP 148/90 | HR 72 | Wt 338.1 lb

## 2019-12-24 DIAGNOSIS — N83202 Unspecified ovarian cyst, left side: Secondary | ICD-10-CM | POA: Diagnosis not present

## 2019-12-24 DIAGNOSIS — I10 Essential (primary) hypertension: Secondary | ICD-10-CM | POA: Diagnosis not present

## 2019-12-24 MED ORDER — AMLODIPINE BESYLATE 5 MG PO TABS: 5.0000 mg | ORAL_TABLET | Freq: Every day | ORAL | 2 refills | Status: DC

## 2019-12-24 NOTE — Progress Notes (Signed)
GYNECOLOGY OFFICE CONSULT NOTE  History:   Latoya Mora is a 24 y.o. [phone] here today for surgical consultation given her large left ovarian dermoid cyst encompassing the entire ovary. She was scheduled for removal in May 2020 but she was discovered to be pregnant so the procedure was deferred.  She is doing well after vaginal delivery in 07/2019, baby is doing well.  Reports occasional episodes of left sided pain radiating upwards and towards her flank area, feels the cyst may have grown larger. Pain usually goes away on its own, no associated symptoms.   No current pain.   She denies any abnormal vaginal discharge, bleeding, pelvic pain or other concerns.    Past Medical History:  Diagnosis Date   Allergy    Anemia in pregnancy 02/11/2019   CBC Latest Ref Rng & Units 06/09/2019 05/23/2019 05/03/2019 WBC 3.4 - 10.8 x10E3/uL 11.4(H) 11.5(H) 11.8(H) Hemoglobin 11.1 - 15.9 g/dL 11.4 11.2(L) 11.5(L) Hematocrit 34.0 - 46.6 % 37.0 36.1 37.0 Platelets 150 - 450 x10E3/uL 262 279 283     Anxiety    Depression    Dermoid cyst of left ovary 11/04/2017   Elevated liver enzymes    Frequent headaches    Gestational diabetes    History of pre-eclampsia 06/26/2018   Hypertension    Infection    UTI   PCOS (polycystic ovarian syndrome)     Past Surgical History:  Procedure Laterality Date   NO PAST SURGERIES     TOOTH EXTRACTION      The following portions of the patient's history were reviewed and updated as appropriate: allergies, current medications, past family history, past medical history, past social history, past surgical history and problem list.   Health Maintenance:  Normal pap and negative HRHPV in 10/2017 at [facility].   Review of Systems:  Pertinent items noted in HPI and remainder of comprehensive ROS otherwise negative.  Physical Exam:  BP (!) 148/90    Pulse 72    Wt (!) 338 lb 1.6 oz (153.4 kg)    LMP 12/14/2019    BMI 51.41 kg/m  CONSTITUTIONAL:  Well-developed, well-nourished female in no acute distress.  HEENT:  Normocephalic, atraumatic. External right and left ear normal. No scleral icterus.  NECK: Normal range of motion, supple, no masses noted on observation SKIN: No rash noted. Not diaphoretic. No erythema. No pallor. MUSCULOSKELETAL: Normal range of motion. No edema noted. NEUROLOGIC: Alert and oriented to person, place, and time. Normal muscle tone coordination. No cranial nerve deficit noted. PSYCHIATRIC: Normal mood and affect. Normal behavior. Normal judgment and thought content. CARDIOVASCULAR: Normal heart rate noted RESPIRATORY: Effort and breath sounds normal, no problems with respiration noted ABDOMEN: Obese. Nontender.  No masses noted. No other overt distention noted.   PELVIC: Deferred      Assessment and Plan:    1. Cyst of left ovary Negative tumor markers in 10/2018.  Will obtain pelvic ultrasound to visualize cyst, last scan was in 10/2018 and pregnancy scans did note increase in size to about 9 cm but no detailed characteristics. The plan was always to take out the entire adnexa.  Based on the ordered ultrasound findings, will decide of mode of unilateral salpingoophorectomy : open vs laparoscopic vs robotic assisted. Patient desires minimally invasive surgery, does not want to stay overnight if possible.  Discussed concern about dermoid removals, and risk of peritonitis with rupture of contents, and need to perform surgery safely.  Will also keep her BMI of  51.4 in mind.  She will be contacted with plans after ultrasound is done. - US PELVIC COMPLETE WITH TRANSVAGINAL; Future  2. Essential hypertension Restarted Amlodipine 5 mg daily (this was started for control postpartum but patient discontinued this later on) - amLODipine (NORVASC) 5 MG tablet; Take 1 tablet (5 mg total) by mouth daily.  Dispense: 30 tablet; Refill: 2 She reports she has appointment with PCP at end of month; they can change regimen/manage as  needed.   Routine preventative health maintenance measures emphasized. Please refer to After Visit Summary for other counseling recommendations.   Return for any gynecologic concerns.    Total face-to-face time with patient: 20 minutes.  Over 50% of encounter was spent on counseling and coordination of care.   Verita Schneiders, MD, [place] for [employer], [place]

## 2019-12-24 NOTE — Patient Instructions (Signed)
Unilateral Salpingo-Oophorectomy Unilateral salpingo-oophorectomy is the surgical removal of one fallopian tube and one ovary. The ovaries are the female reproductive organs that produce eggs. The fallopian tubes allow eggs to move from the ovaries to the uterus. You may need this procedure if you have:  An infection in the fallopian tube and ovary.  Scar tissue (adhesions) in the fallopian tube and ovary.  A cyst or tumor on the ovary.  Your uterus removed.  Cancer of the fallopian tube or ovary. There are three techniques that can be used for this procedure:  Open. One incision will be made in your abdomen.  Laparoscopic. A thin, lighted tube with a camera (laparoscope) will be used to perform the procedure. The laparoscope will allow your surgeon to make several small incisions in the abdomen instead of one large incision.  Robot-assisted. A computer will be used to control surgical instruments that are attached to robotic arms. A laparoscope may also be used with this technique. This procedure:  Will not stop you from becoming pregnant.  Will not cause menopause.  Will not cause problems with your menstrual periods.  Will not affect your sex drive. Tell a health care provider about:  Any allergies you have.  All medicines you are taking, including vitamins, herbs, eye drops, creams, and over-the-counter medicines.  Any problems you or family members have had with anesthetic medicines.  Any blood disorders you have.  Any surgeries you have had.  Any medical conditions you have.  Whether you are pregnant or may be pregnant. What are the risks? Generally, this is a safe procedure. However, problems may occur, including:  Infection.  Bleeding.  Allergic reactions to medicines.  Damage to other structures or organs.  Blood clots in the legs or lungs. What happens before the procedure? Staying hydrated Follow instructions from your health care provider about  hydration, which may include:  Up to 2 hours before the procedure - you may continue to drink clear liquids, such as water, clear fruit juice, black coffee, and plain tea. Eating and drinking restrictions Follow instructions from your health care provider about eating and drinking, which may include:  8 hours before the procedure - stop eating heavy meals or foods such as meat, fried foods, or fatty foods.  6 hours before the procedure - stop eating light meals or foods, such as toast or cereal.  6 hours before the procedure - stop drinking milk or drinks that contain milk.  2 hours before the procedure - stop drinking clear liquids. Medicines  Ask your health care provider about: ? Changing or stopping your regular medicines. This is especially important if you are taking diabetes medicines or blood thinners. ? Taking over-the-counter medicines, vitamins, herbs, and supplements. ? Taking medicines such as aspirin and ibuprofen. These medicines can thin your blood. Do not take these medicines unless your health care provider tells you to take them.  You may be given antibiotic medicine to help prevent infection. General instructions  Do not smoke for at least 2 weeks before your procedure, or as told by your health care provider.  You may have an exam or testing.  You may have a blood or urine sample taken.  Ask your health care provider how your surgical site will be marked or identified.  Plan to have someone take you home from the hospital or clinic.  If you will be going home right after the procedure, plan to have someone with you for 24 hours. What happens during  the procedure?  To reduce your risk of infection: ? Your health care team will wash or sanitize their hands. ? Hair may be removed from the surgical area. ? Your skin will be washed with soap.  An IV will be inserted into one of your veins.  You will be given one or both of the following: ? A medicine to  help you relax (sedative). ? A medicine to make you fall asleep (general anesthetic).  A small, thin tube (catheter) will be inserted through your urethra and into your bladder. The catheter will drain urine during the procedure.  Depending on the type of surgery you are having, your surgeon will do one of the following: ? Make one incision in your abdomen (open surgery). ? Make two small incisions in your abdomen (laparoscopic surgery). The laparoscope will be passed through one incision, and surgical instruments will be passed through the other. ? Make several small incisions in your abdomen (robot-assisted surgery). A laparoscope and other surgical instruments may be passed through the incisions.  Your fallopian tube and ovary will be cut away from the uterus and removed.  Your blood vessels will be clamped and tied to prevent too much bleeding.  The incisions in your abdomen will be closed with stitches (sutures) or staples.  A bandage (dressing) may be placed over your incisions. The procedure may vary among health care providers and hospitals. What happens after the procedure?  Your blood pressure, heart rate, breathing rate, and blood oxygen level will be monitored until the medicines you were given have worn off.  You may continue to receive fluids and medicines through an IV.  You may continue to have a catheter draining your urine.  You may have to wear compression stockings. These stockings help to prevent blood clots and reduce swelling in your legs.  You will be given pain medicine as needed.  Do not drive for 24 hours if you received a sedative. Summary  Unilateral salpingo-oophorectomy is the surgical removal of one fallopian tube and one ovary.  There are three techniques that can be used for this procedure: open, laparoscopic, and robotic. Ask your health care provider which procedure will be used in your case.  This procedure will not stop you from becoming  pregnant, or cause problems with your menstrual periods or sex drive. This information is not intended to replace advice given to you by your health care provider. Make sure you discuss any questions you have with your health care provider. Document Revised: 05/24/2017 Document Reviewed: 09/20/2016 Elsevier Patient Education  [place].

## 2020-01-01 ENCOUNTER — Other Ambulatory Visit: Payer: Self-pay

## 2020-01-01 ENCOUNTER — Ambulatory Visit (HOSPITAL_COMMUNITY)
Admission: RE | Admit: 2020-01-01 | Discharge: 2020-01-01 | Disposition: A | Discharge status: Home / Self Care | Payer: Medicaid Other | Source: Ambulatory Visit | Attending: Obstetrics & Gynecology | Admitting: Obstetrics & Gynecology

## 2020-01-01 DIAGNOSIS — N83202 Unspecified ovarian cyst, left side: Secondary | ICD-10-CM

## 2020-01-04 ENCOUNTER — Encounter: Payer: Self-pay | Admitting: Obstetrics & Gynecology

## 2020-01-04 ENCOUNTER — Telehealth: Payer: Self-pay | Admitting: *Deleted

## 2020-01-04 NOTE — Telephone Encounter (Signed)
Message left on nurse VM by [facility]. She stated that she is calling report of [place] with critical values. She needs a call back from clinical staff even if electronic report can be viewed in the pt's chart.

## 2020-01-04 NOTE — Addendum Note (Signed)
Addended by: Verita Schneiders A on: 01/04/2020 10:44 AM   Modules accepted: Orders, SmartSet

## 2020-01-04 NOTE — Progress Notes (Signed)
Patient will be scheduled for laparoscopic left salpingoophorectomy, possible laparotomy for her large left dermoid ovarian cyst (will need a partner or at least RNFA).  Please call to tell patient of her upcoming surgery, and let her know she will be contacted by our surgical scheduler for further details.  Also please schedule for preoperative visit about 2 weeks prior to surgery (once it is scheduled).  Thank you!  Verita Schneiders, MD

## 2020-01-12 ENCOUNTER — Encounter: Payer: Self-pay | Admitting: Internal Medicine

## 2020-01-12 ENCOUNTER — Ambulatory Visit: Payer: Medicaid Other | Admitting: Internal Medicine

## 2020-01-12 ENCOUNTER — Other Ambulatory Visit: Payer: Self-pay

## 2020-01-12 VITALS — BP 128/86 | HR 98 | Temp 98.1°F | Ht 68.5 in | Wt 337.0 lb

## 2020-01-12 DIAGNOSIS — F428 Other obsessive-compulsive disorder: Secondary | ICD-10-CM

## 2020-01-12 DIAGNOSIS — E282 Polycystic ovarian syndrome: Secondary | ICD-10-CM

## 2020-01-12 DIAGNOSIS — F419 Anxiety disorder, unspecified: Secondary | ICD-10-CM

## 2020-01-12 DIAGNOSIS — F329 Major depressive disorder, single episode, unspecified: Secondary | ICD-10-CM

## 2020-01-12 DIAGNOSIS — F32A Depression, unspecified: Secondary | ICD-10-CM

## 2020-01-12 MED ORDER — VENLAFAXINE HCL ER 150 MG PO CP24: 150.0000 mg | ORAL_CAPSULE | Freq: Every day | ORAL | 2 refills | Status: DC

## 2020-01-12 NOTE — Assessment & Plan Note (Signed)
Deteriorated Increase Venlafaxine up to 150 mg daily Referral to psychology for CBT  Update me in 3-4 weeks and let me know how you are doing

## 2020-01-12 NOTE — Assessment & Plan Note (Signed)
Managed off meds  Will monitor

## 2020-01-12 NOTE — Patient Instructions (Signed)

## 2020-01-12 NOTE — Progress Notes (Signed)
HPI  Patient presents the clinic today to a reestablish care and for management of the conditions listed below.  PCOS: Currently managed off meds. She follows with GYN.  Anxiety and Depression: Persistent, managed on Venlafaxine. She is having daily anxiety, and thinks she may need a dose adjustment. She does have some concerns about postpartum OCD. She report she constantly worries about the worst that could happen. She has no thoughts about hurting her children. She is not currently seeing a therapist. She has had thoughts about self harm (banging her head into a wall) but denies SI/HI.  Flu: 02/2019 Tetanus: 05/2019 Covid: 10/2019, 10/2019 Pap Smear: 10/2017 Dentist: annually   Past Medical History:  Diagnosis Date  . Allergy   . Anemia in pregnancy 02/11/2019   CBC Latest Ref Rng & Units 06/09/2019 05/23/2019 05/03/2019 WBC 3.4 - 10.8 x10E3/uL 11.4(H) 11.5(H) 11.8(H) Hemoglobin 11.1 - 15.9 g/dL 11.4 11.2(L) 11.5(L) Hematocrit 34.0 - 46.6 % 37.0 36.1 37.0 Platelets 150 - 450 x10E3/uL 262 279 283    . Anxiety   . Depression   . Dermoid cyst of left ovary 11/04/2017  . Elevated liver enzymes   . Frequent headaches   . Gestational diabetes   . History of pre-eclampsia 06/26/2018  . Hypertension   . Infection    UTI  . PCOS (polycystic ovarian syndrome)   . Seasonal allergies 09/18/2016    Current Outpatient Medications  Medication Sig Dispense Refill  . amLODipine (NORVASC) 5 MG tablet Take 1 tablet (5 mg total) by mouth daily. 30 tablet 2  . venlafaxine XR (EFFEXOR-XR) 75 MG 24 hr capsule TAKE 1 CAPSULE BY MOUTH EVERY DAY 90 capsule 2   No current facility-administered medications for this visit.    Allergies  Allergen Reactions  . Etodolac Nausea Only  . Meloxicam Other (See Comments)    Abdominal pain  . Flexeril [Cyclobenzaprine] Nausea Only  . Nsaids Other (See Comments)    Stomach pain. Pt ok with Motrin  . Prozac [Fluoxetine Hcl] Other (See Comments)    Felt bad/awful     Family History  Problem Relation Age of Onset  . Depression Mother   . Anxiety disorder Mother   . Heart disease Mother   . Skin cancer Mother   . Scoliosis Mother   . Alcohol abuse Father   . Bipolar disorder Father   . Depression Father   . Osteochondroma Father   . COPD Maternal Grandmother   . Scoliosis Maternal Grandmother   . COPD Maternal Grandfather   . Skin cancer Maternal Grandfather   . Parkinson's disease Maternal Grandfather   . Arthritis Paternal Grandmother   . Diabetes Paternal Grandmother   . Bipolar disorder Paternal Grandfather   . Osteochondroma Sister   . Osteochondroma Brother   . Cancer Neg Hx     Social History   Socioeconomic History  . Marital status: Married    Spouse name: Ellaree Gear  . Number of children: Not on file  . Years of education: Not on file  . Highest education level: Not on file  Occupational History  . Not on file  Tobacco Use  . Smoking status: Never Smoker  . Smokeless tobacco: Never Used  Vaping Use  . Vaping Use: Never used  Substance and Sexual Activity  . Alcohol use: Not Currently  . Drug use: No  . Sexual activity: Yes    Birth control/protection: None  Other Topics Concern  . Not on file  Social History Narrative  .  Not on file   Social Determinants of Health   Financial Resource Strain:   . Difficulty of Paying Living Expenses:   Food Insecurity:   . Worried About Charity fundraiser in the Last Year:   . Arboriculturist in the Last Year:   Transportation Needs:   . Film/video editor (Medical):   Marland Kitchen Lack of Transportation (Non-Medical):   Physical Activity:   . Days of Exercise per Week:   . Minutes of Exercise per Session:   Stress:   . Feeling of Stress :   Social Connections:   . Frequency of Communication with Friends and Family:   . Frequency of Social Gatherings with Friends and Family:   . Attends Religious Services:   . Active Member of Clubs or Organizations:   . Attends English as a second language teacher Meetings:   Marland Kitchen Marital Status:   Intimate Partner Violence:   . Fear of Current or Ex-Partner:   . Emotionally Abused:   Marland Kitchen Physically Abused:   . Sexually Abused:     ROS:  Constitutional: Denies fever, malaise, fatigue, headache or abrupt weight changes.  HEENT: Denies eye pain, eye redness, ear pain, ringing in the ears, wax buildup, runny nose, nasal congestion, bloody nose, or sore throat. Respiratory: Denies difficulty breathing, shortness of breath, cough or sputum production.   Cardiovascular: Denies chest pain, chest tightness, palpitations or swelling in the hands or feet.  Gastrointestinal: Denies abdominal pain, bloating, constipation, diarrhea or blood in the stool.  GU: Denies frequency, urgency, pain with urination, blood in urine, odor or discharge. Musculoskeletal: Denies decrease in range of motion, difficulty with gait, muscle pain or joint pain and swelling.  Skin: Denies redness, rashes, lesions or ulcercations.  Neurological: Denies dizziness, difficulty with memory, difficulty with speech or problems with balance and coordination.  Psych: Pt reports anxiety, depression, and obsessive thoughts. Denies SI/HI.  No other specific complaints in a complete review of systems (except as listed in HPI above).  PE:  BP 128/86   Pulse 98   Temp 98.1 F (36.7 C) (Temporal)   Ht 5' 8.5" (1.74 m)   Wt (!) 337 lb (152.9 kg)   LMP 01/05/2020   SpO2 98%   BMI 50.50 kg/m   Wt Readings from Last 3 Encounters:  12/24/19 (!) 338 lb 1.6 oz (153.4 kg)  08/04/19 (!) 330 lb (149.7 kg)  07/28/19 (!) 322 lb (146.1 kg)    General: Appears her stated age, obese, in NAD. Skin: Dry and intact. HEENT: Head: normal shape and size;  Cardiovascular: Normal rate. Pulmonary/Chest: Normal effort. Musculoskeletal: No difficulty with gait.  Neurological: Alert and oriented.  Psychiatric: Anxious appearing. Behavior is normal. Judgment and thought content normal.      BMET    Component Value Date/Time   NA 134 (L) 08/04/2019 0205   NA 135 07/28/2019 0955   K 4.1 08/04/2019 0205   CL 103 08/04/2019 0205   CO2 21 (L) 08/04/2019 0205   GLUCOSE 102 (H) 08/04/2019 0205   BUN 5 (L) 08/04/2019 0205   BUN 4 (L) 07/28/2019 0955   CREATININE 0.51 08/04/2019 0205   CALCIUM 8.9 08/04/2019 0205   GFRNONAA >60 08/04/2019 0205   GFRAA >60 08/04/2019 0205    Lipid Panel  No results found for: CHOL, TRIG, HDL, CHOLHDL, VLDL, LDLCALC  CBC    Component Value Date/Time   WBC 8.5 08/04/2019 0941   RBC 5.33 (H) 08/04/2019 0941   HGB 12.2 08/04/2019  0941   HGB 11.4 06/09/2019 0813   HCT 40.7 08/04/2019 0941   HCT 37.0 06/09/2019 0813   PLT 220 08/04/2019 0941   PLT 262 06/09/2019 0813   MCV 76.4 (L) 08/04/2019 0941   MCV 73 (L) 06/09/2019 0813   MCH 22.9 (L) 08/04/2019 0941   MCHC 30.0 08/04/2019 0941   RDW 18.0 (H) 08/04/2019 0941   RDW 17.0 (H) 06/09/2019 0813   LYMPHSABS 2.3 05/23/2019 1834   LYMPHSABS 2.5 02/09/2019 1408   MONOABS 0.7 05/23/2019 1834   EOSABS 0.1 05/23/2019 1834   EOSABS 0.2 02/09/2019 1408   BASOSABS 0.0 05/23/2019 1834   BASOSABS 0.0 02/09/2019 1408    Hgb A1C Lab Results  Component Value Date   HGBA1C 5.2 02/27/2019     Assessment and Plan:   Webb Silversmith, NP This visit occurred during the SARS-CoV-2 public health emergency.  Safety protocols were in place, including screening questions prior to the visit, additional usage of staff PPE, and extensive cleaning of exam room while observing appropriate contact time as indicated for disinfecting solutions.

## 2020-01-29 ENCOUNTER — Other Ambulatory Visit: Payer: Self-pay | Admitting: Internal Medicine

## 2020-01-29 DIAGNOSIS — F419 Anxiety disorder, unspecified: Secondary | ICD-10-CM

## 2020-01-29 DIAGNOSIS — F32A Depression, unspecified: Secondary | ICD-10-CM

## 2020-01-29 NOTE — Progress Notes (Signed)
re

## 2020-02-02 ENCOUNTER — Encounter: Payer: Medicaid Other | Admitting: Obstetrics & Gynecology

## 2020-02-03 ENCOUNTER — Encounter: Payer: Self-pay | Admitting: Internal Medicine

## 2020-02-04 ENCOUNTER — Telehealth: Payer: Self-pay | Admitting: Internal Medicine

## 2020-02-04 NOTE — Telephone Encounter (Signed)
Patient called stating that pharmacy did not have order for Venlafaxine 150 MG prescription and she is out. Please send order and message patient on [email or web address] order is sent.

## 2020-02-18 ENCOUNTER — Telehealth: Payer: Self-pay

## 2020-02-18 NOTE — Telephone Encounter (Signed)
Called patient, no answer, left voicemail that her surgery time has changed. Advised her to call the office back if she had any questions.

## 2020-02-18 NOTE — Pre-Procedure Instructions (Signed)
Takaya Hyslop  02/18/2020    Your procedure is scheduled on Tuesday, February 23, 2020 at 11:05 AM.   Report to [facility] Entrance "A" Admitting Office at 9:05 AM.   Call this number if you have problems the morning of surgery: [phone]   Remember:  Do not eat food after midnight Monday, 02/22/20.  You may drink clear liquids until 8:00 AM.  Clear liquids allowed are: Water, Juice (non-citric and without pulp - diabetics please choose diet or no sugar options), Carbonated beverages - (diabetics please choose diet or no sugar options), Clear Tea, Black Coffee only (no creamer, milk or cream including half and half) and Gatorade (diabetics please choose diet or no sugar options)    Take these medicines the morning of surgery with A SIP OF WATER: Venlafaxine XR (Effexor XR), Acetaminophen (Tylenol) - if needed.  Stop Melatonin as of today prior to surgery. Do not use other Herbal medications, Multivitamins, Aspirin containing products, NSAIDS (Ibuprofen, Aleve, etc) or Fish Oil prior to surgery.     Do not wear jewelry, make-up or nail polish.  Do not wear lotions, powders, perfumes or deodorant.  Do not shave 48 hours prior to surgery.    Do not bring valuables to the hospital.  [facility] is not responsible for any belongings or valuables.  Contacts, dentures or bridgework may not be worn into surgery.  Leave your suitcase in the car.  After surgery it may be brought to your room.  For patients admitted to the hospital, discharge time will be determined by your treatment team.  Patients discharged the day of surgery will not be allowed to drive home.   [facility] - Preparing for Surgery  Before surgery, you can play an important role.  Because skin is not sterile, your skin needs to be as free of germs as possible.  You can reduce the number of germs on you skin by washing with CHG (chlorahexidine gluconate) soap before surgery.  CHG is an antiseptic cleaner which kills  germs and bonds with the skin to continue killing germs even after washing.  Oral Hygiene is also important in reducing the risk of infection.  Remember to brush your teeth with your regular toothpaste the morning of surgery.  Please DO NOT use if you have an allergy to CHG or antibacterial soaps.  If your skin becomes reddened/irritated stop using the CHG and inform your nurse when you arrive at Short Stay.  Do not shave (including legs and underarms) for at least 48 hours prior to the first CHG shower.  You may shave your face.  Please follow these instructions carefully:   1.  Shower with CHG Soap the night before surgery and the morning of Surgery.  2.  If you choose to wash your hair, wash your hair first as usual with your normal shampoo.  3.  After you shampoo, rinse your hair and body thoroughly to remove the shampoo. 4.  Use CHG as you would any other liquid soap.  You can apply chg directly to the skin and wash gently with a      scrungie or washcloth.           5.  Apply the CHG Soap to your body ONLY FROM THE NECK DOWN.   Do not use on open wounds or open sores. Avoid contact with your eyes, ears, mouth and genitals (private parts).  Wash genitals (private parts) with your normal soap - do this prior to  using CHG soap.  6.  Wash thoroughly, paying special attention to the area where your surgery will be performed.  7.  Thoroughly rinse your body with warm water from the neck down.  8.  DO NOT shower/wash with your normal soap after using and rinsing off the CHG Soap.  9.  Pat yourself dry with a clean towel.            10.  Wear clean pajamas.            11.  Place clean sheets on your bed the night of your first shower and do not sleep with pets.  Day of Surgery  Shower as above. Do not apply any lotions/deodorants the morning of surgery.   Please wear clean clothes to the hospital. Remember to brush your teeth with toothpaste.  Please read over the following fact sheets that  you were given.

## 2020-02-19 ENCOUNTER — Other Ambulatory Visit: Payer: Self-pay

## 2020-02-19 ENCOUNTER — Encounter (HOSPITAL_COMMUNITY)
Admission: RE | Admit: 2020-02-19 | Discharge: 2020-02-19 | Disposition: A | Discharge status: Home / Self Care | Payer: Medicaid Other | Source: Ambulatory Visit | Attending: Obstetrics & Gynecology | Admitting: Obstetrics & Gynecology

## 2020-02-19 ENCOUNTER — Encounter (HOSPITAL_COMMUNITY): Payer: Self-pay

## 2020-02-19 ENCOUNTER — Other Ambulatory Visit (HOSPITAL_COMMUNITY)
Admission: RE | Admit: 2020-02-19 | Discharge: 2020-02-19 | Disposition: A | Discharge status: Home / Self Care | Payer: Medicaid Other | Source: Ambulatory Visit | Attending: Obstetrics & Gynecology | Admitting: Obstetrics & Gynecology

## 2020-02-19 DIAGNOSIS — Z01812 Encounter for preprocedural laboratory examination: Secondary | ICD-10-CM | POA: Insufficient documentation

## 2020-02-19 DIAGNOSIS — Z20822 Contact with and (suspected) exposure to covid-19: Secondary | ICD-10-CM | POA: Diagnosis not present

## 2020-02-19 HISTORY — DX: Anemia, unspecified: D64.9

## 2020-02-19 LAB — COMPREHENSIVE METABOLIC PANEL
ALT: 16 U/L (ref 0–44)
AST: 15 U/L (ref 15–41)
Albumin: 3.6 g/dL (ref 3.5–5.0)
Alkaline Phosphatase: 102 U/L (ref 38–126)
Anion gap: 10 (ref 5–15)
BUN: 8 mg/dL (ref 6–20)
CO2: 24 mmol/L (ref 22–32)
Calcium: 9 mg/dL (ref 8.9–10.3)
Chloride: 103 mmol/L (ref 98–111)
Creatinine, Ser: 0.63 mg/dL (ref 0.44–1.00)
GFR calc Af Amer: >60 mL/min (ref >60)
GFR calc non Af Amer: >60 mL/min (ref >60)
Glucose, Bld: 101 mg/dL — ABNORMAL HIGH (ref 70–99)
Potassium: 4 mmol/L (ref 3.5–5.1)
Sodium: 137 mmol/L (ref 135–145)
Total Bilirubin: 0.4 mg/dL (ref 0.3–1.2)
Total Protein: 7.6 g/dL (ref 6.5–8.1)

## 2020-02-19 LAB — CBC
HCT: 38.2 % (ref 36.0–46.0)
Hemoglobin: 10.9 g/dL — ABNORMAL LOW (ref 12.0–15.0)
MCH: 21.6 pg — ABNORMAL LOW (ref 26.0–34.0)
MCHC: 28.5 g/dL — ABNORMAL LOW (ref 30.0–36.0)
MCV: 75.8 fL — ABNORMAL LOW (ref 80.0–100.0)
Platelets: 281 10*3/uL (ref 150–400)
RBC: 5.04 MIL/uL (ref 3.87–5.11)
RDW: 17 % — ABNORMAL HIGH (ref 11.5–15.5)
WBC: 9.1 10*3/uL (ref 4.0–10.5)
nRBC: 0 % (ref 0.0–0.2)

## 2020-02-19 LAB — SARS CORONAVIRUS 2 (TAT 6-24 HRS): SARS Coronavirus 2: NEGATIVE

## 2020-02-19 NOTE — Progress Notes (Addendum)
PCP - Webb Silversmith, NP  ERAS Protcol - yes  PRE-SURGERY Ensure or G2- not ordered  COVID TEST- scheduled for today   Anesthesia review: no  Patient denies shortness of breath, fever, cough and chest pain at PAT appointment   All instructions explained to the patient, with a verbal understanding of the material. Patient agrees to go over the instructions while at home for a better understanding. Patient also instructed to self quarantine after being tested for COVID-19. The opportunity to ask questions was provided.

## 2020-02-23 ENCOUNTER — Other Ambulatory Visit: Payer: Self-pay

## 2020-02-23 ENCOUNTER — Inpatient Hospital Stay (HOSPITAL_COMMUNITY): Payer: Medicaid Other | Admitting: Certified Registered Nurse Anesthetist

## 2020-02-23 ENCOUNTER — Ambulatory Visit (HOSPITAL_COMMUNITY)
Admission: RE | Admit: 2020-02-23 | Discharge: 2020-02-23 | Disposition: A | Discharge status: Home / Self Care | Payer: Medicaid Other | Attending: Obstetrics & Gynecology | Admitting: Obstetrics & Gynecology

## 2020-02-23 ENCOUNTER — Encounter (HOSPITAL_COMMUNITY): Payer: Self-pay | Admitting: Obstetrics & Gynecology

## 2020-02-23 ENCOUNTER — Encounter (HOSPITAL_COMMUNITY)
Admission: RE | Disposition: A | Discharge status: Home / Self Care | Payer: Self-pay | Source: Home / Self Care | Attending: Obstetrics & Gynecology

## 2020-02-23 DIAGNOSIS — E282 Polycystic ovarian syndrome: Secondary | ICD-10-CM | POA: Insufficient documentation

## 2020-02-23 DIAGNOSIS — Z6841 Body Mass Index (BMI) 40.0 and over, adult: Secondary | ICD-10-CM

## 2020-02-23 DIAGNOSIS — D271 Benign neoplasm of left ovary: Secondary | ICD-10-CM | POA: Insufficient documentation

## 2020-02-23 DIAGNOSIS — N83202 Unspecified ovarian cyst, left side: Secondary | ICD-10-CM | POA: Diagnosis present

## 2020-02-23 DIAGNOSIS — F418 Other specified anxiety disorders: Secondary | ICD-10-CM | POA: Diagnosis not present

## 2020-02-23 HISTORY — PX: LAPAROSCOPIC SALPINGO OOPHERECTOMY: SHX5927

## 2020-02-23 LAB — POCT PREGNANCY, URINE: Preg Test, Ur: NEGATIVE

## 2020-02-23 SURGERY — SALPINGO-OOPHORECTOMY, LAPAROSCOPIC
Anesthesia: General | Site: Abdomen

## 2020-02-23 MED ORDER — OXYCODONE HCL 5 MG PO TABS: 5.0000 mg | ORAL_TABLET | Freq: Once | ORAL | Status: DC | PRN

## 2020-02-23 MED ORDER — ALBUTEROL SULFATE HFA 108 (90 BASE) MCG/ACT IN AERS
INHALATION_SPRAY | RESPIRATORY_TRACT | Status: AC
  Filled 2020-02-23: qty 6.7

## 2020-02-23 MED ORDER — PROMETHAZINE HCL 25 MG/ML IJ SOLN
6.2500 mg | INTRAMUSCULAR | Status: DC | PRN
  Administered 2020-02-23: 12.5 mg via INTRAVENOUS

## 2020-02-23 MED ORDER — BUPIVACAINE HCL (PF) 0.5 % IJ SOLN
INTRAMUSCULAR | Status: DC | PRN
  Administered 2020-02-23: 17 mL

## 2020-02-23 MED ORDER — LIDOCAINE 2% (20 MG/ML) 5 ML SYRINGE
INTRAMUSCULAR | Status: DC | PRN
  Administered 2020-02-23: 60 mg via INTRAVENOUS

## 2020-02-23 MED ORDER — PROMETHAZINE HCL 25 MG/ML IJ SOLN
INTRAMUSCULAR | Status: AC
  Filled 2020-02-23: qty 1

## 2020-02-23 MED ORDER — GABAPENTIN 300 MG PO CAPS
300.0000 mg | ORAL_CAPSULE | ORAL | Status: AC
  Administered 2020-02-23: 300 mg via ORAL
  Filled 2020-02-23: qty 1

## 2020-02-23 MED ORDER — ROCURONIUM BROMIDE 10 MG/ML (PF) SYRINGE
PREFILLED_SYRINGE | INTRAVENOUS | Status: DC | PRN
  Administered 2020-02-23: 10 mg via INTRAVENOUS
  Administered 2020-02-23: 60 mg via INTRAVENOUS

## 2020-02-23 MED ORDER — ONDANSETRON HCL 4 MG/2ML IJ SOLN
INTRAMUSCULAR | Status: AC
  Filled 2020-02-23: qty 2

## 2020-02-23 MED ORDER — KETOROLAC TROMETHAMINE 30 MG/ML IJ SOLN
INTRAMUSCULAR | Status: DC | PRN
  Administered 2020-02-23: 30 mg via INTRAVENOUS

## 2020-02-23 MED ORDER — KETOROLAC TROMETHAMINE 30 MG/ML IJ SOLN
INTRAMUSCULAR | Status: AC
  Filled 2020-02-23: qty 1

## 2020-02-23 MED ORDER — OXYCODONE HCL 5 MG PO TABS: 5.0000 mg | ORAL_TABLET | ORAL | 0 refills | Status: DC | PRN

## 2020-02-23 MED ORDER — LACTATED RINGERS IV SOLN: INTRAVENOUS | Status: DC | PRN

## 2020-02-23 MED ORDER — ALBUTEROL SULFATE HFA 108 (90 BASE) MCG/ACT IN AERS
INHALATION_SPRAY | RESPIRATORY_TRACT | Status: DC | PRN
  Administered 2020-02-23 (×3): 2 via RESPIRATORY_TRACT

## 2020-02-23 MED ORDER — DEXAMETHASONE SODIUM PHOSPHATE 10 MG/ML IJ SOLN
INTRAMUSCULAR | Status: AC
  Filled 2020-02-23: qty 1

## 2020-02-23 MED ORDER — SUGAMMADEX SODIUM 200 MG/2ML IV SOLN
INTRAVENOUS | Status: DC | PRN
  Administered 2020-02-23 (×2): 100 mg via INTRAVENOUS
  Administered 2020-02-23: 200 mg via INTRAVENOUS

## 2020-02-23 MED ORDER — DEXAMETHASONE SODIUM PHOSPHATE 10 MG/ML IJ SOLN
INTRAMUSCULAR | Status: DC | PRN
  Administered 2020-02-23: 10 mg via INTRAVENOUS

## 2020-02-23 MED ORDER — ACETAMINOPHEN 500 MG PO TABS
1000.0000 mg | ORAL_TABLET | ORAL | Status: AC
  Administered 2020-02-23: 1000 mg via ORAL
  Filled 2020-02-23: qty 2

## 2020-02-23 MED ORDER — MIDAZOLAM HCL 2 MG/2ML IJ SOLN
INTRAMUSCULAR | Status: DC | PRN
  Administered 2020-02-23: 2 mg via INTRAVENOUS

## 2020-02-23 MED ORDER — PROPOFOL 10 MG/ML IV BOLUS
INTRAVENOUS | Status: AC
  Filled 2020-02-23: qty 20

## 2020-02-23 MED ORDER — SCOPOLAMINE 1 MG/3DAYS TD PT72
1.0000 | MEDICATED_PATCH | TRANSDERMAL | Status: DC
  Administered 2020-02-23: 1.5 mg via TRANSDERMAL
  Filled 2020-02-23: qty 1

## 2020-02-23 MED ORDER — HYDROMORPHONE HCL 1 MG/ML IJ SOLN
INTRAMUSCULAR | Status: DC | PRN
  Administered 2020-02-23: .5 mg via INTRAVENOUS

## 2020-02-23 MED ORDER — HYDROMORPHONE HCL 1 MG/ML IJ SOLN
INTRAMUSCULAR | Status: AC
  Filled 2020-02-23: qty 0.5

## 2020-02-23 MED ORDER — SODIUM CHLORIDE 0.9 % IV SOLN
2.0000 g | INTRAVENOUS | Status: AC
  Administered 2020-02-23: 2 g via INTRAVENOUS
  Filled 2020-02-23: qty 2

## 2020-02-23 MED ORDER — OXYCODONE HCL 5 MG/5ML PO SOLN: 5.0000 mg | Freq: Once | ORAL | Status: DC | PRN

## 2020-02-23 MED ORDER — CHLORHEXIDINE GLUCONATE 0.12 % MT SOLN
15.0000 mL | Freq: Once | OROMUCOSAL | Status: AC
  Administered 2020-02-23: 15 mL via OROMUCOSAL
  Filled 2020-02-23: qty 15

## 2020-02-23 MED ORDER — HYDROMORPHONE HCL 1 MG/ML IJ SOLN: 0.2500 mg | INTRAMUSCULAR | Status: DC | PRN

## 2020-02-23 MED ORDER — DOCUSATE SODIUM 100 MG PO CAPS: 100.0000 mg | ORAL_CAPSULE | Freq: Two times a day (BID) | ORAL | 2 refills | Status: DC | PRN

## 2020-02-23 MED ORDER — SODIUM CHLORIDE 0.9 % IR SOLN
Status: DC | PRN
  Administered 2020-02-23: 1000 mL

## 2020-02-23 MED ORDER — ROCURONIUM BROMIDE 10 MG/ML (PF) SYRINGE
PREFILLED_SYRINGE | INTRAVENOUS | Status: AC
  Filled 2020-02-23: qty 10

## 2020-02-23 MED ORDER — ORAL CARE MOUTH RINSE: 15.0000 mL | Freq: Once | OROMUCOSAL | Status: AC

## 2020-02-23 MED ORDER — BUPIVACAINE HCL (PF) 0.5 % IJ SOLN
INTRAMUSCULAR | Status: AC
  Filled 2020-02-23: qty 30

## 2020-02-23 MED ORDER — FENTANYL CITRATE (PF) 250 MCG/5ML IJ SOLN
INTRAMUSCULAR | Status: AC
  Filled 2020-02-23: qty 5

## 2020-02-23 MED ORDER — MIDAZOLAM HCL 2 MG/2ML IJ SOLN
INTRAMUSCULAR | Status: AC
  Filled 2020-02-23: qty 2

## 2020-02-23 MED ORDER — ONDANSETRON HCL 4 MG/2ML IJ SOLN
INTRAMUSCULAR | Status: DC | PRN
  Administered 2020-02-23: 4 mg via INTRAVENOUS

## 2020-02-23 MED ORDER — IBUPROFEN 600 MG PO TABS: 600.0000 mg | ORAL_TABLET | Freq: Four times a day (QID) | ORAL | 2 refills | Status: DC | PRN

## 2020-02-23 MED ORDER — 0.9 % SODIUM CHLORIDE (POUR BTL) OPTIME
TOPICAL | Status: DC | PRN
  Administered 2020-02-23: 1000 mL

## 2020-02-23 MED ORDER — LACTATED RINGERS IV SOLN: INTRAVENOUS | Status: DC

## 2020-02-23 MED ORDER — POVIDONE-IODINE 10 % EX SWAB: 2.0000 "application " | Freq: Once | CUTANEOUS | Status: DC

## 2020-02-23 MED ORDER — PROPOFOL 10 MG/ML IV BOLUS
INTRAVENOUS | Status: DC | PRN
  Administered 2020-02-23: 200 mg via INTRAVENOUS

## 2020-02-23 MED ORDER — LIDOCAINE 2% (20 MG/ML) 5 ML SYRINGE
INTRAMUSCULAR | Status: AC
  Filled 2020-02-23: qty 5

## 2020-02-23 MED ORDER — FENTANYL CITRATE (PF) 250 MCG/5ML IJ SOLN
INTRAMUSCULAR | Status: DC | PRN
Start: 2020-02-23 — End: 2020-02-23
  Administered 2020-02-23: 50 ug via INTRAVENOUS
  Administered 2020-02-23 (×2): 100 ug via INTRAVENOUS

## 2020-02-23 SURGICAL SUPPLY — 64 items
BENZOIN TINCTURE PRP APPL 2/3 (GAUZE/BANDAGES/DRESSINGS) ×3 IMPLANT
CABLE HIGH FREQUENCY MONO STRZ (ELECTRODE) IMPLANT
CANISTER SUCT 3000ML PPV (MISCELLANEOUS) ×3 IMPLANT
CATH ROBINSON RED A/P 16FR (CATHETERS) ×3 IMPLANT
CELLS DAT CNTRL 66122 CELL SVR (MISCELLANEOUS) IMPLANT
COVER WAND RF STERILE (DRAPES) ×3 IMPLANT
DECANTER SPIKE VIAL GLASS SM (MISCELLANEOUS) IMPLANT
DERMABOND ADVANCED (GAUZE/BANDAGES/DRESSINGS) ×1
DERMABOND ADVANCED .7 DNX12 (GAUZE/BANDAGES/DRESSINGS) ×2 IMPLANT
DRAPE WARM FLUID 44X44 (DRAPES) IMPLANT
DRSG OPSITE POSTOP 3X4 (GAUZE/BANDAGES/DRESSINGS) ×3 IMPLANT
DRSG OPSITE POSTOP 4X10 (GAUZE/BANDAGES/DRESSINGS) ×3 IMPLANT
DURAPREP 26ML APPLICATOR (WOUND CARE) ×3 IMPLANT
GAUZE 4X4 16PLY RFD (DISPOSABLE) ×3 IMPLANT
GAUZE SPONGE 4X4 12PLY STRL LF (GAUZE/BANDAGES/DRESSINGS) ×3 IMPLANT
GLOVE BIO SURGEON STRL SZ 6.5 (GLOVE) ×3 IMPLANT
GLOVE BIOGEL PI IND STRL 7.0 (GLOVE) ×8 IMPLANT
GLOVE BIOGEL PI INDICATOR 7.0 (GLOVE) ×4
GLOVE ECLIPSE 7.0 STRL STRAW (GLOVE) ×3 IMPLANT
GOWN STRL REUS W/ TWL LRG LVL3 (GOWN DISPOSABLE) ×6 IMPLANT
GOWN STRL REUS W/TWL LRG LVL3 (GOWN DISPOSABLE) ×3
KIT TURNOVER KIT B (KITS) ×3 IMPLANT
NEEDLE HYPO 22GX1.5 SAFETY (NEEDLE) ×3 IMPLANT
NEEDLE INSUFFLATION 14GA 120MM (NEEDLE) IMPLANT
NEEDLE SPNL 18GX3.5 QUINCKE PK (NEEDLE) ×3 IMPLANT
NS IRRIG 1000ML POUR BTL (IV SOLUTION) ×3 IMPLANT
PACK ABDOMINAL GYN (CUSTOM PROCEDURE TRAY) ×3 IMPLANT
PACK LAPAROSCOPY BASIN (CUSTOM PROCEDURE TRAY) ×3 IMPLANT
PACK TRENDGUARD 450 HYBRID PRO (MISCELLANEOUS) ×2 IMPLANT
PAD OB MATERNITY 4.3X12.25 (PERSONAL CARE ITEMS) ×3 IMPLANT
POUCH ENDO CATCH II 15MM (MISCELLANEOUS) ×3 IMPLANT
POUCH SPECIMEN RETRIEVAL 10MM (ENDOMECHANICALS) ×3 IMPLANT
PROTECTOR NERVE ULNAR (MISCELLANEOUS) ×6 IMPLANT
RTRCTR WOUND ALEXIS 18CM MED (MISCELLANEOUS)
SET IRRIG TUBING LAPAROSCOPIC (IRRIGATION / IRRIGATOR) ×3 IMPLANT
SHEARS HARMONIC ACE PLUS 36CM (ENDOMECHANICALS) IMPLANT
SLEEVE XCEL OPT CAN 5 100 (ENDOMECHANICALS) ×3 IMPLANT
SPECIMEN JAR MEDIUM (MISCELLANEOUS) ×3 IMPLANT
SPONGE LAP 18X18 RF (DISPOSABLE) ×6 IMPLANT
STRIP CLOSURE SKIN 1/2X4 (GAUZE/BANDAGES/DRESSINGS) ×3 IMPLANT
SUT MNCRL AB 4-0 PS2 18 (SUTURE) ×6 IMPLANT
SUT PDS AB 0 CTX 60 (SUTURE) IMPLANT
SUT PLAIN 2 0 (SUTURE)
SUT PLAIN ABS 2-0 CT1 27XMFL (SUTURE) IMPLANT
SUT VIC AB 0 CT1 18XCR BRD8 (SUTURE) IMPLANT
SUT VIC AB 0 CT1 27 (SUTURE) ×1
SUT VIC AB 0 CT1 27XBRD ANBCTR (SUTURE) ×2 IMPLANT
SUT VIC AB 0 CT1 8-18 (SUTURE)
SUT VIC AB 0 CTX 36 (SUTURE)
SUT VIC AB 0 CTX36XBRD ANBCTRL (SUTURE) IMPLANT
SUT VIC AB 4-0 KS 27 (SUTURE) ×3 IMPLANT
SUT VICRYL 0 TIES 12 18 (SUTURE) IMPLANT
SUT VICRYL 0 UR6 27IN ABS (SUTURE) ×3 IMPLANT
SYR 50ML LL SCALE MARK (SYRINGE) ×3 IMPLANT
SYR CONTROL 10ML LL (SYRINGE) ×3 IMPLANT
TOWEL GREEN STERILE FF (TOWEL DISPOSABLE) ×6 IMPLANT
TRAY FOLEY W/BAG SLVR 14FR (SET/KITS/TRAYS/PACK) ×3 IMPLANT
TRENDGUARD 450 HYBRID PRO PACK (MISCELLANEOUS) ×3
TROCAR 5M 150ML BLDLS (TROCAR) ×9 IMPLANT
TROCAR BLADELESS 15MM (ENDOMECHANICALS) ×3 IMPLANT
TROCAR XCEL 12X100 BLDLESS (ENDOMECHANICALS) ×3 IMPLANT
TROCAR XCEL NON-BLD 11X100MML (ENDOMECHANICALS) ×3 IMPLANT
TROCAR XCEL NON-BLD 5MMX100MML (ENDOMECHANICALS) ×3 IMPLANT
TUBING EVAC SMOKE HEATED PNEUM (TUBING) ×3 IMPLANT

## 2020-02-23 NOTE — Anesthesia Procedure Notes (Signed)
Procedure Name: Intubation Date/Time: 02/23/2020 11:05 AM Performed by: Michele Rockers, CRNA Pre-anesthesia Checklist: Patient identified, Emergency Drugs available, Suction available and Patient being monitored Patient Re-evaluated:Patient Re-evaluated prior to induction Oxygen Delivery Method: Circle system utilized Preoxygenation: Pre-oxygenation with 100% oxygen Induction Type: IV induction Ventilation: Mask ventilation without difficulty Laryngoscope Size: Miller and 2 Grade View: Grade I Tube type: Oral Tube size: 7.0 mm Number of attempts: 1 Airway Equipment and Method: Stylet and Oral airway Placement Confirmation: ETT inserted through vocal cords under direct vision,  positive ETCO2 and breath sounds checked- equal and bilateral Secured at: 21 cm Tube secured with: Tape Dental Injury: Teeth and Oropharynx as per pre-operative assessment

## 2020-02-23 NOTE — Op Note (Signed)
Latoya Mora PROCEDURE DATE: 02/23/2020  PREOPERATIVE DIAGNOSES: Symptomatic large left ovarian complex cyst, morbid obesity POSTOPERATIVE DIAGNOSES: The same PROCEDURE: Laparoscopic left salpingoophorectomy SURGEON:  Dr. Verita Schneiders ASSISTANT:  Dr. Emeterio Reeve. An experienced assistant was required given the standard of surgical care given the complexity of the case.  This assistant was needed for exposure, dissection, suctioning, retraction, instrument exchange, and for overall help during the procedure. ANESTHESIOLOGIST: Dr. Adele Barthel  INDICATIONS: 24 y.o. [id number] with aforementioned preoperative diagnoses here today for definitive surgical management.   Risks of surgery were discussed with the patient including but not limited to: bleeding which may require transfusion or reoperation; infection which may require antibiotics; injury to bowel, bladder, ureters or other surrounding organs; need for additional procedures including laparotomy or subsequent procedures secondary to abnormal pathology; thromboembolic phenomenon, incisional problems and other postoperative/anesthesia complications. Written informed consent was obtained.    FINDINGS:  Small uterus, left adnexa with large ovarian cyst encompassing entire ovary about 10 cm in size Twisted left utero-ovarian ligament. Normal right adnexa with some some physiologic appearing ovarian follicles.  No evidence of endometriosis, adhesions or any other abdominal/pelvic abnormality.  Normal upper abdomen.  ANESTHESIA:    General ESTIMATED BLOOD LOSS: 30 ml SPECIMENS:  Left ovary and fallopian tube COMPLICATIONS: None immediate   PROCEDURE IN DETAIL:  The patient received intravenous antibiotics and had sequential compression devices applied to her lower extremities while in the preoperative area.  She was then taken to the operating room where general anesthesia was administered and was found to be adequate.  She was placed in the  dorsal lithotomy position, and was prepped and draped in a sterile manner.  A Foley catheter was inserted into her bladder and attached to constant drainage and a uterine manipulator was then advanced into the uterus . After an adequate timeout was performed, attention was then turned to the patient's abdomen where a 5-mm skin incision was made in the umbilical fold.  The Optiview 5-mm trocar and sleeve were then advanced without difficulty with the laparoscope under direct visualization into the abdomen.  The abdomen was then insufflated with carbon dioxide gas.  Adequate pneumoperitoneum was obtained.  A survey of the patient's pelvis and abdomen revealed the findings above. Bilateral 5-mm lower quadrant ports were then placed under direct visualization. On the left side, the uteroovarian ligament was then clamped and transected with the Harmonic device.  The left infundibulopelvic ligament was also clamped and transected allowing for salpingooophorectomy.  Excellent hemostasis was noted. The left port site was enlarged to accommodate a 15 mm trocar that was inserted, and a large Endocatch bag was placed to get the specimen.  The specimen was then removed from the abdomen through the 15-mm port, under direct visualization. There was extensive suctioning within the bag and bringing out fragments of the adnexa, this took significant time even after extending fascial incision and skin incision to about 2 cm in length for this site.  But the specimen was removed in the bag, no spillage of contents into peritoneal cavity.   The operative site was surveyed, and it was found to be hemostatic.  No intraoperative injury to other surrounding organs was noted.  The abdomen was desufflated and all instruments were then removed from the patient's abdomen. The fascial incision of the left port site was closed with a 0 Vicryl running stitch.  All skin incisions were closed with 3-0 VMonocryl subcuticular stitches and Dermabond.   The patient tolerated the procedure  well.  Sponge, lap, and needle counts were correct times three.  The patient was then taken to the recovery room awake, extubated and in stable condition.  The patient will be discharged to home as per PACU criteria.  Routine postoperative instructions given.  She was prescribed Oxycodone, Ibuprofen and Colace.  She will follow up in the office in 2-3 weeks for postoperative evaluation.   Verita Schneiders, MD, [place] [facility], [place]

## 2020-02-23 NOTE — Transfer of Care (Signed)
Immediate Anesthesia Transfer of Care Note  Patient: Latoya Mora  Procedure(s) Performed: LAPAROSCOPIC SALPINGO OOPHORECTOMY (Left Abdomen)  Patient Location: PACU  Anesthesia Type:General  Level of Consciousness: awake, patient cooperative and responds to stimulation  Airway & Oxygen Therapy: Patient Spontanous Breathing and Patient connected to face mask oxygen  Post-op Assessment: Report given to RN, Post -op Vital signs reviewed and stable and Patient moving all extremities X 4  Post vital signs: Reviewed and stable  Last Vitals:  Vitals Value Taken Time  BP    Temp    Pulse 109 02/23/20 1356  Resp 22 02/23/20 1356  SpO2 97 % 02/23/20 1356  Vitals shown include unvalidated device data.  Last Pain:  Vitals:   02/23/20 1001  TempSrc:   PainSc: 0-No pain         Complications: No complications documented.

## 2020-02-23 NOTE — Anesthesia Postprocedure Evaluation (Signed)
Anesthesia Post Note  Patient: Latoya Mora  Procedure(s) Performed: LAPAROSCOPIC SALPINGO OOPHORECTOMY (Left Abdomen)     Patient location during evaluation: PACU Anesthesia Type: General Level of consciousness: awake Pain management: pain level controlled Vital Signs Assessment: post-procedure vital signs reviewed and stable Respiratory status: spontaneous breathing, nonlabored ventilation, respiratory function stable and patient connected to nasal cannula oxygen Cardiovascular status: blood pressure returned to baseline and stable Postop Assessment: no apparent nausea or vomiting Anesthetic complications: no   No complications documented.  Last Vitals:  Vitals:   02/23/20 1420 02/23/20 1435  BP: (!) 144/78 (!) 143/83  Pulse: 95 97  Resp: 16 16  Temp:  36.9 C  SpO2: 96% 95%    Last Pain:  Vitals:   02/23/20 1420  TempSrc:   PainSc: Asleep                 Lester Platas P Calypso Hagarty

## 2020-02-23 NOTE — Discharge Instructions (Signed)
Unilateral Salpingo-Oophorectomy, Care After This sheet gives you information about how to care for yourself after your procedure. Your health care provider may also give you more specific instructions. If you have problems or questions, contact your health care provider. What can I expect after the procedure? After the procedure, it is common to have:  Abdominal pain.  Some occasional vaginal bleeding (spotting).  Tiredness. Follow these instructions at home: Incision care   Keep your incision area and your bandage (dressing) clean and dry.  Follow instructions from your health care provider about how to take care of your incision. Make sure you: ? Wash your hands with soap and water before you change your dressing. If soap and water are not available, use hand sanitizer. ? Change your dressing as told by your health care provider. ? Leave stitches (sutures), staples, skin glue, or adhesive strips in place. These skin closures may need to stay in place for 2 weeks or longer. If adhesive strip edges start to loosen and curl up, you may trim the loose edges. Do not remove adhesive strips completely unless your health care provider tells you to do that.  Check your incision area every day for signs of infection. Check for: ? Redness, swelling, or pain. ? Fluid or blood. ? Warmth. ? Pus or a bad smell. Activity  Do not drive or use heavy machinery while taking prescription pain medicine.  Do not drive for 24 hours if you received a medicine to help you relax (sedative).  Take frequent, short walks throughout the day. Rest when you get tired. Ask your health care provider what activities are safe for you.  Avoid activities that require great effort. Also, avoid heavy lifting. Do not lift anything that is heavier than 5 lb (2.3 kg), or the limit that your health care provider tells you, until he or she says that it is safe to do so.  Do not douche, use tampons, or have sex until your  health care provider approves. General instructions  To prevent or treat constipation while you are taking prescription pain medicine, your health care provider may recommend that you: ? Drink enough fluid to keep your urine pale yellow. ? Take over-the-counter or prescription medicines. ? Eat foods that are high in fiber, such as fresh fruits and vegetables, whole grains, and beans. ? Limit foods that are high in fat and processed sugars, such as fried and sweet foods.  Take over-the-counter and prescription medicines only as told by your health care provider.  Do not take baths, swim, or use a hot tub until your health care provider approves. Ask your health care provider if you may take showers. You may only be allowed to take sponge baths.  Wear compression stockings as told by your health care provider. These stockings help to prevent blood clots and reduce swelling in your legs.  Keep all follow-up visits as told by your health care provider. This is important. Contact a health care provider if:  You have pain when you urinate.  You have pus or a bad smelling discharge coming from your vagina.  You have redness, swelling, or pain around your incision.  You have fluid or blood coming from your incision.  Your incision feels warm to the touch.  You have pus or a bad smell coming from your incision.  You have a fever.  Your incision starts to break open.  You have abdominal pain that gets worse or does not get better with medicine.    You develop a rash.  You develop nausea and vomiting.  You feel lightheaded. Get help right away if:  You develop pain in your chest or leg.  You develop shortness of breath.  You faint.  You have increased bleeding from your vagina. Summary  After the procedure, it is common to have pain, tiredness, and occasional bleeding from the vagina.  Follow instructions from your health care provider about how to take care of your  incision.  Check your incision every day for signs of infection and report any symptoms to your health care provider.  Follow instructions from your health care provider about activities and restrictions. This information is not intended to replace advice given to you by your health care provider. Make sure you discuss any questions you have with your health care provider. Document Revised: 05/24/2017 Document Reviewed: 09/20/2016 Elsevier Patient Education  [employer].   Laparoscopic Surgery - Care After Laparoscopy is a surgical procedure. It is used to diagnose and treat diseases inside the belly(abdomen). It is usually a brief, common, and relatively simple procedure. The laparoscopeis a thin, lighted, pencil-sized instrument. It is like a telescope. It is inserted into your abdomen through a small cut (incision). Your caregiver can look at the organs inside your body through this instrument.  She can see if there is anything abnormal. Laparoscopy can be done either in a hospital or outpatient clinic. You may be given a mild sedative to help you relax before the procedure. Once in the operating room, you will be given a drug to make you sleep (general anesthesia). Laparoscopy usually lasts about 1 hour. After the procedure, you will be monitored in a recovery area until you are stable and doing well. Once you are home, it may take 3 to 7 days to fully recover.   Laparoscopy has relatively few risks. Your caregiver will discuss the risks with you before the procedure. Some problems that can occur include: RISKS AND COMPLICATIONS   Allergies to medicines.  Difficulty breathing.  Bleeding.  Infection.  Damage to other surrounding structures HOME CARE INSTRUCTIONS   Infection.  Bleeding.  Damage to other organs.  Anesthetic side effects.   Need for additional procedures such as open procedures/laparotomy PROCEDURE Once you receive anesthesia, your surgeon inflates the  abdomen with a harmless gas (carbon dioxide). This makes the organs easier to see. The laparoscope is inserted into the abdomen through a small incision. This allows your surgeon to see into the abdomen. Other small instruments are also inserted into the abdomen through other small openings. Many surgeons attach a video camera to the laparoscope to enlarge the view. During a laparoscopy, the surgeon may be looking for inflammation, infection, or cancer.  The surgeon may also need to take out certain organs or take tissue samples (biopsies). The specimens are sent to a specialist in looking at cells and tissue samples (pathologist). The pathologist examines them under a microscope to help to diagnose or confirm a disease. AFTER THE PROCEDURE   The incisions are closed with stitches (sutures) and Dermabond. Because these incisions are small (usually less than 1/2 inch), there is usually minimal discomfort after the procedure. There may also be discomfort from the instrument placement incisions in the abdomen. You will be given pain medicine to ease any discomfort.  You will rest in a recovery room for 1-2 hours until you are stable and doing well.  You may have some mild discomfort in the throat. This is from the tube  placed in your throat while you were sleeping.  You may experience discomfort in the shoulder area from some trapped air between the liver and diaphragm. This sensation is normal and will slowly go away on its own.  The recovery time is shortened as long as there are no complications.  You will rest in a recovery room until stable and doing well. As long as there are no complications, you may be allowed to go home. Someone will need to drive you home and be with you for at least 24 hours once home. FINDING OUT THE RESULTS You will be called with the results of the pathology and will discuss these results with  your caregiver during your postoperative appointment. Do not assume everything  is normal if you have not heard from your caregiver or the medical facility. It is important for you to follow up on all of your results. HOME CARE INSTRUCTIONS   Take all medicines as directed.  Only take over-the-counter or prescription medicines for pain, discomfort, or fever as directed by your caregiver.  Resume daily activities as directed.  Showers are preferred over baths.  You may resume sexual activities in 1 week or as directed.  Do not drive while taking narcotics. SEEK MEDICAL CARE IF:  There is increasing abdominal pain.  You feel lightheaded or faint.  You have the chills.  You have an oral temperature above 102 F (38.9 C).  There is pus-like (purulent) drainage from any of the wounds.  You are unable to pass gas or have a bowel movement.  You feel sick to your stomach (nauseous) or throw up (vomit). MAKE SURE YOU:   Understand these instructions.  Will watch your condition.  Will get help right away if you are not doing well or get worse.  ExitCare Patient Information 2013 [place].

## 2020-02-23 NOTE — Anesthesia Preprocedure Evaluation (Addendum)
Anesthesia Evaluation  Patient identified by MRN, date of birth, ID band Patient awake    Reviewed: Allergy & Precautions, NPO status , Patient's Chart, lab work & pertinent test results  Airway Mallampati: II  TM Distance: >3 FB Neck ROM: Full    Dental no notable dental hx.    Pulmonary neg pulmonary ROS,    Pulmonary exam normal breath sounds clear to auscultation       Cardiovascular negative cardio ROS Normal cardiovascular exam Rhythm:Regular Rate:Normal     Neuro/Psych  Headaches, PSYCHIATRIC DISORDERS Anxiety Depression    GI/Hepatic negative GI ROS, Neg liver ROS,   Endo/Other  Morbid obesity (Super)PCOS (polycystic ovarian syndrome)  Renal/GU negative Renal ROS     Musculoskeletal negative musculoskeletal ROS (+)   Abdominal (+) + obese,   Peds  Hematology negative hematology ROS (+)   Anesthesia Other Findings Left Dermoid Cyst  Reproductive/Obstetrics hcg negative                            Anesthesia Physical Anesthesia Plan  ASA: IV  Anesthesia Plan: General   Post-op Pain Management:    Induction: Intravenous  PONV Risk Score and Plan: 4 or greater and Scopolamine patch - Pre-op, Midazolam, Dexamethasone, Ondansetron and Treatment may vary due to age or medical condition  Airway Management Planned: Oral ETT  Additional Equipment:   Intra-op Plan:   Post-operative Plan: Extubation in OR  Informed Consent: I have reviewed the patients History and Physical, chart, labs and discussed the procedure including the risks, benefits and alternatives for the proposed anesthesia with the patient or authorized representative who has indicated his/her understanding and acceptance.     Dental advisory given  Plan Discussed with: CRNA  Anesthesia Plan Comments:        Anesthesia Quick Evaluation

## 2020-02-23 NOTE — H&P (Signed)
Preoperative History and Physical  Latoya Mora is a 24 y.o. [phone] here for surgical management of enlarging and symptomatic left ovarian complex cyst concerning for dermoid cyst.  Was scheduled for surgery last year but ended up bing postponed due to pandemic and unplanned pregnancy.  No significant preoperative concerns.  Proposed surgery: Laparoscopic left salpingoophorectomy, possible laparotomy   Past Medical History:  Diagnosis Date  . Allergy   . Anemia    during pregnancies  . Anxiety   . Depression   . Elevated liver enzymes    with both pregnancies, no problems after pregnancies  . Frequent headaches    no longer having frequent headaches  . History of pre-eclampsia   . PCOS (polycystic ovarian syndrome)    Past Surgical History:  Procedure Laterality Date  . TOOTH EXTRACTION     local anesthestic only   OB History  Gravida Para Term Preterm AB Living  2 2 2  0 0 2  SAB TAB Ectopic Multiple Live Births  0 0 0 0 2    # Outcome Date GA Lbr Len/2nd Weight Sex Delivery Anes PTL Lv  2 Term 08/04/19 [redacted]w[redacted]d 03:40 / 00:03 3115 g M Vag-Spont EPI  LIV  1 Term 05/28/18 [redacted]w[redacted]d 02:00 / 00:13 3368 g M Vag-Spont EPI  LIV  Patient denies any other pertinent gynecologic issues.   No current facility-administered medications on file prior to encounter.   Current Outpatient Medications on File Prior to Encounter  Medication Sig Dispense Refill  . acetaminophen (TYLENOL) 500 MG tablet Take 1,000 mg by mouth every 6 (six) hours as needed for moderate pain or headache.    . Melatonin 3 MG CAPS Take 3 mg by mouth at bedtime.    . [DISCONTINUED] busPIRone (BUSPAR) 10 MG tablet Take 1 tablet (10 mg total) by mouth 3 (three) times daily. 90 tablet 1   Allergies  Allergen Reactions  . Etodolac Nausea Only  . Meloxicam Other (See Comments)    Abdominal pain  . Flexeril [Cyclobenzaprine] Nausea Only  . Nsaids Other (See Comments)    Stomach pain. Pt ok with Motrin  . Prozac  [Fluoxetine Hcl] Other (See Comments)    Felt bad/awful  . Hydroxyzine Nausea Only    Social History:   reports that she has never smoked. She has never used smokeless tobacco. She reports previous alcohol use. She reports that she does not use drugs.  Family History  Problem Relation Age of Onset  . Depression Mother   . Anxiety disorder Mother   . Heart disease Mother   . Skin cancer Mother   . Scoliosis Mother   . Alcohol abuse Father   . Bipolar disorder Father   . Depression Father   . Osteochondroma Father   . COPD Maternal Grandmother   . Scoliosis Maternal Grandmother   . COPD Maternal Grandfather   . Skin cancer Maternal Grandfather   . Parkinson's disease Maternal Grandfather   . Arthritis Paternal Grandmother   . Diabetes Paternal Grandmother   . Bipolar disorder Paternal Grandfather   . Osteochondroma Sister   . Osteochondroma Brother   . Cancer Neg Hx     Review of Systems: Pertinent items noted in HPI and remainder of comprehensive ROS otherwise negative.  PHYSICAL EXAM: Blood pressure (!) 169/96, pulse 94, temperature 98.4 F (36.9 C), resp. rate 20, height 5' 8.5" (1.74 m), last menstrual period 02/05/2020, SpO2 100 %, unknown if currently breastfeeding. CONSTITUTIONAL: Well-developed, well-nourished female in no acute distress.  HENT:  Normocephalic, atraumatic, External right and left ear normal. Oropharynx is clear and moist EYES: Conjunctivae and EOM are normal. Pupils are equal, round, and reactive to light. No scleral icterus.  NECK: Normal range of motion, supple, no masses SKIN: Skin is warm and dry. No rash noted. Not diaphoretic. No erythema. No pallor. NEUROLOGIC: Alert and oriented to person, place, and time. Normal reflexes, muscle tone coordination. No cranial nerve deficit noted. PSYCHIATRIC: Normal mood and affect. Normal behavior. Normal judgment and thought content. CARDIOVASCULAR: Normal heart rate noted, regular rhythm RESPIRATORY:  Effort and breath sounds normal, no problems with respiration noted ABDOMEN: Soft, obese, nontender, nondistended. PELVIC: Deferred MUSCULOSKELETAL: Normal range of motion. No edema and no tenderness. 2+ distal pulses.  Labs: Results for orders placed or performed during the hospital encounter of 02/19/20 (from the past 336 hour(s))  SARS CORONAVIRUS 2 (TAT 6-24 HRS) Nasopharyngeal Nasopharyngeal Swab   Collection Time: 02/19/20 11:35 AM   Specimen: Nasopharyngeal Swab  Result Value Ref Range   SARS Coronavirus 2 NEGATIVE NEGATIVE  Results for orders placed or performed during the hospital encounter of 02/19/20 (from the past 336 hour(s))  CBC   Collection Time: 02/19/20  9:50 AM  Result Value Ref Range   WBC 9.1 4.0 - 10.5 K/uL   RBC 5.04 3.87 - 5.11 MIL/uL   Hemoglobin 10.9 (L) 12.0 - 15.0 g/dL   HCT 38.2 36 - 46 %   MCV 75.8 (L) 80.0 - 100.0 fL   MCH 21.6 (L) 26.0 - 34.0 pg   MCHC 28.5 (L) 30.0 - 36.0 g/dL   RDW 17.0 (H) 11.5 - 15.5 %   Platelets 281 150 - 400 K/uL   nRBC 0.0 0.0 - 0.2 %  Comprehensive metabolic panel per protocol   Collection Time: 02/19/20  9:50 AM  Result Value Ref Range   Sodium 137 135 - 145 mmol/L   Potassium 4.0 3.5 - 5.1 mmol/L   Chloride 103 98 - 111 mmol/L   CO2 24 22 - 32 mmol/L   Glucose, Bld 101 (H) 70 - 99 mg/dL   BUN 8 6 - 20 mg/dL   Creatinine, Ser 0.63 0.44 - 1.00 mg/dL   Calcium 9.0 8.9 - 10.3 mg/dL   Total Protein 7.6 6.5 - 8.1 g/dL   Albumin 3.6 3.5 - 5.0 g/dL   AST 15 15 - 41 U/L   ALT 16 0 - 44 U/L   Alkaline Phosphatase 102 38 - 126 U/L   Total Bilirubin 0.4 0.3 - 1.2 mg/dL   GFR calc non Af Amer >60 >60 mL/min   GFR calc Af Amer >60 >60 mL/min   Anion gap 10 5 - 15   11/19/2018 Negative LDH, HCG, CA125, AFP tumor markers  Imaging Studies: US PELVIC COMPLETE WITH TRANSVAGINAL  Result Date: 01/01/2020 CLINICAL DATA:  LEFT ovarian dermoid cyst, follow-up, excision deferred due to recent pregnancy; LMP 12/14/2019 EXAM:  TRANSABDOMINAL AND TRANSVAGINAL ULTRASOUND OF PELVIS TECHNIQUE: Both transabdominal and transvaginal ultrasound examinations of the pelvis were performed. Transabdominal technique was performed for global imaging of the pelvis including uterus, ovaries, adnexal regions, and pelvic cul-de-sac. It was necessary to proceed with endovaginal exam following the transabdominal exam to visualize the uterus, endometrium, and ovaries. COMPARISON:  11/18/2018 FINDINGS: Uterus Measurements: 7.8 x 4.4 x 6.5 cm = volume: 117 mL. Anteverted. Normal morphology without mass. Tiny nabothian cysts at cervix. Endometrium Thickness: 10 mm.  No endometrial fluid or focal abnormality Right ovary Measurements: 3.1 x 2.9 x  2.7 cm = volume: 12.6 mL. Normal morphology without mass Left ovary No normal appearing LEFT ovary visualized, see below Other findings No free pelvic fluid. Mass identified in LEFT adnexa 9.1 x 5.9 x 7.1 cm (volume = 200 cm^3, previously 109 mL), consists of a large cystic area with several large hyperechoic solid-appearing internal nodules. Scattered internal echoes/debris within the cystic component. Lesion demonstrates interval increase in sizes of both the cystic and solid components since the previous study. IMPRESSION: Unremarkable uterus, endometrial complex and RIGHT ovary. Complex cystic and solid mass in LEFT adnexa, likely of LEFT ovarian origin, measuring 9.1 cm in greatest size significantly increased in volume since previous study; imaging features are most suggestive of a dermoid cyst of the LEFT ovary though not definitive. However, with observed interval increase in size, either characterization by MR imaging with and without contrast or surgical evaluation is recommended. These results will be called to the ordering clinician or representative by the Radiologist Assistant, and communication documented in the PACS or [employer]. Electronically Signed   By: Lavonia Dana M.D.   On: 01/01/2020 16:26     Assessment: Principal Problem:   Dermoid cyst of left ovary Active Problems:   Morbid obesity ([place])   Plan: Patient will undergo surgical management with laparoscopic left salpingoophorectomy, possible laparotomy.   The risks of surgery were discussed in detail with the patient including but not limited to: bleeding which may require transfusion or reoperation; infection which may require antibiotics; injury to surrounding organs which may involve bowel, bladder, ureters ; need for additional procedures including laparotomy or subsequent procedures secondary to abnormal pathology; thromboembolic phenomenon, surgical site problems and other postoperative/anesthesia complications. Likelihood of success in alleviating the patient's condition was discussed. Routine postoperative instructions will be reviewed with the patient in detail after surgery.  The patient concurred with the proposed plan, giving informed written consent for the surgery.  Patient has been NPO since last night and she will remain NPO for procedure.  Anesthesia and OR aware.  Preoperative prophylactic antibiotics and SCDs ordered on call to the OR.  To OR when ready.    Verita Schneiders, MD, [place] for [employer], [place]

## 2020-02-24 ENCOUNTER — Encounter (HOSPITAL_COMMUNITY): Payer: Self-pay | Admitting: Obstetrics & Gynecology

## 2020-02-24 LAB — SURGICAL PATHOLOGY

## 2020-03-02 ENCOUNTER — Emergency Department (HOSPITAL_COMMUNITY)
Admission: EM | Admit: 2020-03-02 | Discharge: 2020-03-02 | Disposition: A | Discharge status: Home / Self Care | Payer: Medicaid Other | Attending: Emergency Medicine | Admitting: Emergency Medicine

## 2020-03-02 ENCOUNTER — Encounter: Payer: Self-pay | Admitting: Obstetrics & Gynecology

## 2020-03-02 ENCOUNTER — Emergency Department (HOSPITAL_COMMUNITY): Payer: Medicaid Other

## 2020-03-02 ENCOUNTER — Other Ambulatory Visit: Payer: Self-pay

## 2020-03-02 ENCOUNTER — Encounter (HOSPITAL_COMMUNITY): Payer: Self-pay | Admitting: *Deleted

## 2020-03-02 DIAGNOSIS — Z79899 Other long term (current) drug therapy: Secondary | ICD-10-CM | POA: Diagnosis not present

## 2020-03-02 DIAGNOSIS — R1030 Lower abdominal pain, unspecified: Secondary | ICD-10-CM

## 2020-03-02 DIAGNOSIS — L989 Disorder of the skin and subcutaneous tissue, unspecified: Secondary | ICD-10-CM | POA: Diagnosis not present

## 2020-03-02 DIAGNOSIS — R110 Nausea: Secondary | ICD-10-CM | POA: Insufficient documentation

## 2020-03-02 DIAGNOSIS — K6289 Other specified diseases of anus and rectum: Secondary | ICD-10-CM | POA: Diagnosis not present

## 2020-03-02 DIAGNOSIS — K429 Umbilical hernia without obstruction or gangrene: Secondary | ICD-10-CM | POA: Diagnosis not present

## 2020-03-02 DIAGNOSIS — R1909 Other intra-abdominal and pelvic swelling, mass and lump: Secondary | ICD-10-CM | POA: Diagnosis not present

## 2020-03-02 LAB — PREGNANCY, URINE: Preg Test, Ur: NEGATIVE

## 2020-03-02 LAB — CBC WITH DIFFERENTIAL/PLATELET
Abs Immature Granulocytes: 0.05 10*3/uL (ref 0.00–0.07)
Basophils Absolute: 0.1 10*3/uL (ref 0.0–0.1)
Basophils Relative: 1 %
Eosinophils Absolute: 0.2 10*3/uL (ref 0.0–0.5)
Eosinophils Relative: 2 %
HCT: 40.3 % (ref 36.0–46.0)
Hemoglobin: 11.8 g/dL — ABNORMAL LOW (ref 12.0–15.0)
Immature Granulocytes: 1 %
Lymphocytes Relative: 24 %
Lymphs Abs: 2.6 10*3/uL (ref 0.7–4.0)
MCH: 22.4 pg — ABNORMAL LOW (ref 26.0–34.0)
MCHC: 29.3 g/dL — ABNORMAL LOW (ref 30.0–36.0)
MCV: 76.6 fL — ABNORMAL LOW (ref 80.0–100.0)
Monocytes Absolute: 0.7 10*3/uL (ref 0.1–1.0)
Monocytes Relative: 6 %
Neutro Abs: 7 10*3/uL (ref 1.7–7.7)
Neutrophils Relative %: 66 %
Platelets: 301 10*3/uL (ref 150–400)
RBC: 5.26 MIL/uL — ABNORMAL HIGH (ref 3.87–5.11)
RDW: 17.2 % — ABNORMAL HIGH (ref 11.5–15.5)
WBC: 10.6 10*3/uL — ABNORMAL HIGH (ref 4.0–10.5)
nRBC: 0 % (ref 0.0–0.2)

## 2020-03-02 LAB — URINALYSIS, ROUTINE W REFLEX MICROSCOPIC
Bilirubin Urine: NEGATIVE
Glucose, UA: NEGATIVE mg/dL
Hgb urine dipstick: NEGATIVE
Ketones, ur: NEGATIVE mg/dL
Nitrite: NEGATIVE
Protein, ur: NEGATIVE mg/dL
Specific Gravity, Urine: 1.008 (ref 1.005–1.030)
pH: 7 (ref 5.0–8.0)

## 2020-03-02 LAB — COMPREHENSIVE METABOLIC PANEL
ALT: 26 U/L (ref 0–44)
AST: 20 U/L (ref 15–41)
Albumin: 3.8 g/dL (ref 3.5–5.0)
Alkaline Phosphatase: 99 U/L (ref 38–126)
Anion gap: 9 (ref 5–15)
BUN: 8 mg/dL (ref 6–20)
CO2: 25 mmol/L (ref 22–32)
Calcium: 8.9 mg/dL (ref 8.9–10.3)
Chloride: 103 mmol/L (ref 98–111)
Creatinine, Ser: 0.52 mg/dL (ref 0.44–1.00)
GFR calc Af Amer: >60 mL/min (ref >60)
GFR calc non Af Amer: >60 mL/min (ref >60)
Glucose, Bld: 90 mg/dL (ref 70–99)
Potassium: 4.1 mmol/L (ref 3.5–5.1)
Sodium: 137 mmol/L (ref 135–145)
Total Bilirubin: 0.6 mg/dL (ref 0.3–1.2)
Total Protein: 8 g/dL (ref 6.5–8.1)

## 2020-03-02 LAB — LIPASE, BLOOD: Lipase: 23 U/L (ref 11–51)

## 2020-03-02 MED ORDER — IOHEXOL 9 MG/ML PO SOLN
ORAL | Status: AC
  Filled 2020-03-02: qty 1000

## 2020-03-02 MED ORDER — IOHEXOL 300 MG/ML  SOLN
100.0000 mL | Freq: Once | INTRAMUSCULAR | Status: AC | PRN
  Administered 2020-03-02: 100 mL via INTRAVENOUS

## 2020-03-02 NOTE — Progress Notes (Signed)
Patient called at home, she reported feeling better after evaluation at [facility]. Discussed results of CT scan that showed port site hematoma of the rectus muscle. She is hemodynamically stable, hemoglobin stable at 10.  No signs of infection or hernia.  Patient was told this will be managed conservatively for now, will follow up with her in office next week as planned.    Latoya Schneiders, MD

## 2020-03-02 NOTE — ED Notes (Signed)
Pt drinking contrast. 

## 2020-03-02 NOTE — ED Provider Notes (Signed)
[facility] EMERGENCY DEPARTMENT Provider Note   CSN: [id number] Arrival date & time: 03/02/20  1137     History Chief Complaint  Patient presents with   Post-op Problem    Latoya Mora is a 24 y.o. female.  HPI      Latoya Mora is a 24 y.o. female who is 8 days status post laparoscopic salpingo oophorectomy presents to the Emergency Department with lower abdominal pain and incisional mass.  She noticed mild discomfort of her lower abdomen with a small amount of swelling along the incision of her left lower abdomen on day 3 postop.  Swelling has gradually worsened.  She has worsening pain when defecation or urination and certain movement.  She also complains of mild nausea but no vomiting.  Normal flatus.  Urinating without difficulty.  Bowels have been moving well, last BM was last evening.  She was contacted by her OB/GYN and advised to come to the emergency room for further evaluation and possible imaging for incisional hernia or infection.  She denies fever, vomiting, chest pain, shortness of breath, vaginal bleeding or dysuria.   Past Medical History:  Diagnosis Date   Allergy    Anemia    during pregnancies   Anxiety    Depression    Elevated liver enzymes    with both pregnancies, no problems after pregnancies   Frequent headaches    no longer having frequent headaches   History of pre-eclampsia    PCOS (polycystic ovarian syndrome)     Patient Active Problem List   Diagnosis Date Noted   Dermoid cyst of left ovary 11/04/2017   PCOS (polycystic ovarian syndrome) 10/03/2017   Morbid obesity ([place]) 04/03/2017   Anxiety and depression 09/18/2016    Past Surgical History:  Procedure Laterality Date   LAPAROSCOPIC SALPINGO OOPHERECTOMY Left 02/23/2020   Procedure: LAPAROSCOPIC SALPINGO OOPHORECTOMY;  Surgeon: Osborne Oman, MD;  Location: [place];  Service: Gynecology;  Laterality: Left;   TOOTH EXTRACTION     local anesthestic only      OB History    Gravida  2   Para  2   Term  2   Preterm  0   AB  0   Living  2     SAB  0   TAB  0   Ectopic  0   Multiple  0   Live Births  2           Family History  Problem Relation Age of Onset   Depression Mother    Anxiety disorder Mother    Heart disease Mother    Skin cancer Mother    Scoliosis Mother    Alcohol abuse Father    Bipolar disorder Father    Depression Father    Osteochondroma Father    COPD Maternal Grandmother    Scoliosis Maternal Grandmother    COPD Maternal Grandfather    Skin cancer Maternal Grandfather    Parkinson's disease Maternal Grandfather    Arthritis Paternal Grandmother    Diabetes Paternal Grandmother    Bipolar disorder Paternal Grandfather    Osteochondroma Sister    Osteochondroma Brother    Cancer Neg Hx     Social History   Tobacco Use   Smoking status: Never Smoker   Smokeless tobacco: Never Used  Vaping Use   Vaping Use: Never used  Substance Use Topics   Alcohol use: Not Currently   Drug use: No    Home Medications Prior  to Admission medications   Medication Sig Start Date End Date Taking? Authorizing Provider  acetaminophen (TYLENOL) 500 MG tablet Take 1,000 mg by mouth every 6 (six) hours as needed for moderate pain or headache.    [provider]  docusate sodium (COLACE) 100 MG capsule Take 1 capsule (100 mg total) by mouth 2 (two) times daily as needed for mild constipation or moderate constipation. 02/23/20   Anyanwu, Sallyanne Havers, MD  ibuprofen (ADVIL) 600 MG tablet Take 1 tablet (600 mg total) by mouth every 6 (six) hours as needed for headache, mild pain, moderate pain or cramping. 02/23/20   Anyanwu, Sallyanne Havers, MD  Melatonin 3 MG CAPS Take 3 mg by mouth at bedtime.    [provider]  oxyCODONE (OXY IR/ROXICODONE) 5 MG immediate release tablet Take 1 tablet (5 mg total) by mouth every 4 (four) hours as needed for severe pain or breakthrough pain.  02/23/20   Anyanwu, Sallyanne Havers, MD  venlafaxine XR (EFFEXOR XR) 150 MG 24 hr capsule Take 1 capsule (150 mg total) by mouth daily with breakfast. 01/12/20   Jearld Fenton, NP  busPIRone (BUSPAR) 10 MG tablet Take 1 tablet (10 mg total) by mouth 3 (three) times daily. 11/20/18   Anyanwu, Sallyanne Havers, MD    Allergies    Etodolac, Meloxicam, Flexeril [cyclobenzaprine], Nsaids, Prozac [fluoxetine hcl], and Hydroxyzine  Review of Systems   Review of Systems  Constitutional: Negative for appetite change, chills and fever.  Respiratory: Negative for shortness of breath.   Cardiovascular: Negative for chest pain.  Gastrointestinal: Positive for abdominal pain. Negative for blood in stool, diarrhea, nausea and vomiting.  Genitourinary: Negative for decreased urine volume, difficulty urinating, dysuria and flank pain.  Musculoskeletal: Negative for back pain.  Skin: Negative for color change and rash.  Neurological: Negative for dizziness, weakness and numbness.  Hematological: Negative for adenopathy.    Physical Exam Updated Vital Signs BP (!) 126/94 (BP Location: Right Arm)    Pulse 98    Temp 98.4 F (36.9 C) (Oral)    Resp 16    Ht 5\' 8"  (1.727 m)    Wt (!) 151 kg    LMP 02/05/2020    SpO2 100%    BMI 50.63 kg/m   Physical Exam Vitals and nursing note reviewed.  Constitutional:      Appearance: Normal appearance. She is not ill-appearing.  Cardiovascular:     Rate and Rhythm: Normal rate and regular rhythm.     Pulses: Normal pulses.  Pulmonary:     Effort: Pulmonary effort is normal.     Breath sounds: Normal breath sounds.  Abdominal:     Palpations: Abdomen is soft. There is mass.     Tenderness: There is abdominal tenderness.     Comments: Surgical incision to left lower abdomen with palpable mass at the incision site.  No drainage, no surrounding erythema.  Incision appears to be healing well.  Musculoskeletal:        General: Normal range of motion.     Cervical back: Normal  range of motion.     Right lower leg: No edema.     Left lower leg: No edema.  Skin:    General: Skin is warm.     Capillary Refill: Capillary refill takes less than 2 seconds.     Findings: No erythema or rash.  Neurological:     General: No focal deficit present.     Mental Status: She is alert.  ED Results / Procedures / Treatments   Labs (all labs ordered are listed, but only abnormal results are displayed) Labs Reviewed  CBC WITH DIFFERENTIAL/PLATELET - Abnormal; Notable for the following components:      Result Value   WBC 10.6 (*)    RBC 5.26 (*)    Hemoglobin 11.8 (*)    MCV 76.6 (*)    MCH 22.4 (*)    MCHC 29.3 (*)    RDW 17.2 (*)    All other components within normal limits  URINALYSIS, ROUTINE W REFLEX MICROSCOPIC - Abnormal; Notable for the following components:   Leukocytes,Ua TRACE (*)    Bacteria, UA RARE (*)    All other components within normal limits  COMPREHENSIVE METABOLIC PANEL  LIPASE, BLOOD  PREGNANCY, URINE    EKG None  Radiology CT ABDOMEN PELVIS W CONTRAST  Result Date: 03/02/2020 CLINICAL DATA:  Possible incisional hernia recent laparoscopic salpingo oophorectomy EXAM: CT ABDOMEN AND PELVIS WITH CONTRAST TECHNIQUE: Multidetector CT imaging of the abdomen and pelvis was performed using the standard protocol following bolus administration of intravenous contrast. CONTRAST:  168mL OMNIPAQUE IOHEXOL 300 MG/ML  SOLN COMPARISON:  Pelvic ultrasound 01/01/2020 FINDINGS: Lower chest: Lung bases demonstrate no acute consolidation or effusion. Normal cardiac size. Hepatobiliary: Possible mild hyperdense sludge within the gallbladder. No calcified stone. No biliary dilatation Pancreas: Unremarkable. No pancreatic ductal dilatation or surrounding inflammatory changes. Spleen: Normal in size without focal abnormality. Adrenals/Urinary Tract: Adrenal glands are unremarkable. Kidneys are normal, without renal calculi, focal lesion, or hydronephrosis. Bladder is  unremarkable. Stomach/Bowel: Stomach is within normal limits. Appendix appears normal. No evidence of bowel wall thickening, distention, or inflammatory changes. Vascular/Lymphatic: Nonaneurysmal aorta.  No suspicious adenopathy Reproductive: Uterus and right adnexa are unremarkable. Other: No free air or significant free fluid. Skin thickening over the lower anterior abdominal wall. Mild asymmetric enlargement of the left lower rectus. Mild convex deformity of the left lower rectus. Superficial to this within the subcutaneous fat is a poorly defined mass measuring at least 8.2 by 4.6 cm that courses towards the left abdominal skin surface. There is surrounding soft tissue stranding, this presumably represents a hematoma along the port site. There is no bowel or fat containing hernia. Small fat containing umbilical hernia. Musculoskeletal: No acute or significant osseous findings. IMPRESSION: 1. Negative for bowel or fat containing ventral hernia. Subcutaneous edema and skin thickening over the left lower quadrant abdominal wall with poorly defined mass in the subcutaneous fat measuring at least 8.2 cm, extending from the rectus sheath to the skin surface, suspected to represent hematoma along the port site. There is mild asymmetric enlargement of the left rectus muscle likely due to a small hematoma. 2. Possible mild hyperdense sludge within the gallbladder. Electronically Signed   By: Donavan Foil M.D.   On: 03/02/2020 17:15  Into  Procedures and  Procedures (including critical care time) with your which is necessary    Medications Ordered in ED Medications  iohexol (OMNIPAQUE) 9 MG/ML oral solution (has no administration in time range)    ED Course  I have reviewed the triage vital signs and the nursing notes.  Pertinent labs & imaging results that were available during my care of the patient were reviewed by me and considered in my medical decision making (see chart for details).    MDM  Rules/Calculators/A&P                         Pt 8  days s/p salpingo oophorectomy with gradually developing incisional mass. Pt was recommended by her GYN to come here for further evaluation and imaging to rule out hernia vs infection.   Pt is well appearing and incision looks good, no concerning findings for cellulitis.  Labs today are unremarkable.  Possible seroma  1740  Spoke with Dr. Roselie Awkward and discussed CT findings.  He recommends close f/u, pt has appt with Dr. Harolyn Rutherford for next week.  Pt agreeable to plan.  Return precautions discussed.   Final Clinical Impression(s) / ED Diagnoses Final diagnoses:  Lower abdominal pain    Rx / DC Orders ED Discharge Orders    None       Kem Parkinson, PA-C 03/02/20 1816    Lajean Saver, MD 03/05/20 [phone]

## 2020-03-02 NOTE — Discharge Instructions (Addendum)
Your labs and CT today were good.  Your CT shows a likely hematoma.  This will likely resolve.  Tyr to avoid bending over or heavy lifting.  Be sure to keep your appt with Dr. Harolyn Rutherford for next week.  Return here if needed

## 2020-03-02 NOTE — ED Triage Notes (Signed)
Pt with possible incisional hernia since her LAPAROSCOPIC SALPINGO OOPHORECTOMY (Left Abdomen) done on 8/31.

## 2020-03-03 ENCOUNTER — Telehealth: Payer: Self-pay | Admitting: *Deleted

## 2020-03-03 DIAGNOSIS — R109 Unspecified abdominal pain: Secondary | ICD-10-CM

## 2020-03-03 NOTE — Telephone Encounter (Signed)
Transition Care Management Unsuccessful Follow-up Telephone Call  Date of discharge and from where:  03/02/20, [facility]  Attempts:  1st Attempt  Reason for unsuccessful TCM follow-up call:  Left voice message   Lenor Coffin, RN, BSN, [place] [phone]

## 2020-03-03 NOTE — Addendum Note (Signed)
Addended by: Addison Naegeli on: 03/03/2020 01:03 PM   Modules accepted: Orders

## 2020-03-03 NOTE — Telephone Encounter (Addendum)
Contacted patient to complete Transition of Care Assessment: Transition Care Management Follow-up Telephone Call  Date of discharge and from where: 03/02/20, [facility]  How have you been since you were released from the hospital? "still having same pain I had talked to the doctors about"  Any questions or concerns? No  Items Reviewed:  Did the pt receive and understand the discharge instructions provided? Yes   Medications obtained and verified? Yes   Any new allergies since your discharge? No   Dietary orders reviewed? YES  Do you have support at home? Yes family  Functional Questionnaire: (I = Independent and D = Dependent) ADLs: I  Bathing/Dressing- I  Meal Prep- I  Eating- I  Maintaining continence- I  Transferring/Ambulation- I  Managing Meds- I  Follow up appointments reviewed:   PCP Hospital f/u appt confirmed? No Patient to contact the office of [facility] to schedule follow up appt  [facility] f/u appt confirmed? Yes  Scheduled to see Dr Candyce Churn on 03/08/20  @ 1430.  Are transportation arrangements needed? No   If their condition worsens, is the pt aware to call PCP or go to the Emergency Dept.? YES  Was the patient provided with contact information for the PCP's office or ED? YES  Was to pt encouraged to call back with questions or concerns? YES  Order placed for [facility] Coordination due to patient needs.  Lenor Coffin, RN, BSN, [place] [facility] [place] [phone]

## 2020-03-03 NOTE — Telephone Encounter (Signed)
Pt returned a call from [place] and would like her to call her back [phone]

## 2020-03-06 ENCOUNTER — Encounter: Payer: Self-pay | Admitting: Internal Medicine

## 2020-03-07 ENCOUNTER — Telehealth: Payer: Self-pay | Admitting: Internal Medicine

## 2020-03-07 MED ORDER — VENLAFAXINE HCL ER 150 MG PO CP24: 300.0000 mg | ORAL_CAPSULE | Freq: Every day | ORAL | 2 refills | Status: DC

## 2020-03-07 NOTE — Telephone Encounter (Signed)
  Managed Medicaid Care Management   Outreach Note  03/07/2020 Name: Ita Fritzsche MRN: [id number] DOB: [birth year]  Lizabeth Fellner is a 24 y.o. year old female who is a primary care patient of Jearld Fenton, NP. I reached out to Haydee Monica by phone today in response to a referral sent by Ms. Earl Gala [place]'s health plan.     An unsuccessful telephone outreach was attempted today. The patient was referred to the case management team for assistance with care management and care coordination.   Follow Up Plan: A HIPAA compliant phone message was left for the patient providing contact information and requesting a return call.  The care management team will reach out to the patient again over the next 7 days.  If patient returns call to provider office, please advise to call [place]* at [place]

## 2020-03-08 ENCOUNTER — Other Ambulatory Visit: Payer: Self-pay

## 2020-03-08 ENCOUNTER — Encounter: Payer: Self-pay | Admitting: Obstetrics & Gynecology

## 2020-03-08 ENCOUNTER — Ambulatory Visit (INDEPENDENT_AMBULATORY_CARE_PROVIDER_SITE_OTHER): Payer: Medicaid Other | Admitting: Obstetrics & Gynecology

## 2020-03-08 VITALS — BP 144/92 | HR 103 | Ht 68.0 in | Wt 339.2 lb

## 2020-03-08 DIAGNOSIS — Z23 Encounter for immunization: Secondary | ICD-10-CM | POA: Diagnosis not present

## 2020-03-08 DIAGNOSIS — Z4889 Encounter for other specified surgical aftercare: Secondary | ICD-10-CM

## 2020-03-08 DIAGNOSIS — T8149XA Infection following a procedure, other surgical site, initial encounter: Secondary | ICD-10-CM

## 2020-03-08 DIAGNOSIS — L7632 Postprocedural hematoma of skin and subcutaneous tissue following other procedure: Secondary | ICD-10-CM

## 2020-03-08 MED ORDER — CLINDAMYCIN HCL 300 MG PO CAPS: 600.0000 mg | ORAL_CAPSULE | Freq: Three times a day (TID) | ORAL | 0 refills | Status: AC

## 2020-03-08 NOTE — Progress Notes (Signed)
GYNECOLOGY POSTOPERATIVE OFFICE VISIT  Subjective:     Latoya Mora is a 24 y.o. female who presents to the clinic 2 weeks status post laparoscopic left salpingo-oophorectomy for a large dermoid cyst.  This was complicated by postoperative subcutaneous hematoma of the LLQ port where the the specimen was retrieved through with much difficulty.  Was seen in ED on 03/02/20 for this, no hernia seen.  She was conservatively managed and feels a lot better. Hematoma appears to be resolved under the skin, but still has some pain controlled on Ibuprofen and Tylenol. Eating a regular diet without difficulty. Bowel movements are normal.   The following portions of the patient's history were reviewed and updated as appropriate: allergies, current medications, past family history, past medical history, past social history, past surgical history and problem list.  Review of Systems Pertinent items noted in HPI and remainder of comprehensive ROS otherwise negative.    Objective:    BP (!) 144/92   Pulse (!) 103   Ht 5\' 8"  (1.727 m)   Wt (!) 339 lb 3.2 oz (153.9 kg)   BMI 51.58 kg/m  General:  alert and no distress  Abdomen: soft, bowel sounds active, non-tender  Incision:  LLQ port with hematoma palpating about 4 cm in size, indurated with blanching erythema. Mild TTP, no drainage.  RLQ and umbilical ports  healing well, no drainage, no hernia, no seroma, no swelling, no dehiscence, incision well approximated   02/23/2020  Surgical Pathology FALLOPIAN TUBE AND OVARY, LEFT:  - Left ovary:    Mature cystic teratoma.    No immature or malignant features.  - Left Fallopian tube:    Unremarkable.    No endometriosis or malignancy.   CT ABDOMEN PELVIS W CONTRAST  Result Date: 03/02/2020 CLINICAL DATA:  Possible incisional hernia recent laparoscopic salpingo oophorectomy EXAM: CT ABDOMEN AND PELVIS WITH CONTRAST TECHNIQUE: Multidetector CT imaging of the abdomen and pelvis was performed  using the standard protocol following bolus administration of intravenous contrast. CONTRAST:  123mL OMNIPAQUE IOHEXOL 300 MG/ML  SOLN COMPARISON:  Pelvic ultrasound 01/01/2020 FINDINGS: Lower chest: Lung bases demonstrate no acute consolidation or effusion. Normal cardiac size. Hepatobiliary: Possible mild hyperdense sludge within the gallbladder. No calcified stone. No biliary dilatation Pancreas: Unremarkable. No pancreatic ductal dilatation or surrounding inflammatory changes. Spleen: Normal in size without focal abnormality. Adrenals/Urinary Tract: Adrenal glands are unremarkable. Kidneys are normal, without renal calculi, focal lesion, or hydronephrosis. Bladder is unremarkable. Stomach/Bowel: Stomach is within normal limits. Appendix appears normal. No evidence of bowel wall thickening, distention, or inflammatory changes. Vascular/Lymphatic: Nonaneurysmal aorta.  No suspicious adenopathy Reproductive: Uterus and right adnexa are unremarkable. Other: No free air or significant free fluid. Skin thickening over the lower anterior abdominal wall. Mild asymmetric enlargement of the left lower rectus. Mild convex deformity of the left lower rectus. Superficial to this within the subcutaneous fat is a poorly defined mass measuring at least 8.2 by 4.6 cm that courses towards the left abdominal skin surface. There is surrounding soft tissue stranding, this presumably represents a hematoma along the port site. There is no bowel or fat containing hernia. Small fat containing umbilical hernia. Musculoskeletal: No acute or significant osseous findings. IMPRESSION: 1. Negative for bowel or fat containing ventral hernia. Subcutaneous edema and skin thickening over the left lower quadrant abdominal wall with poorly defined mass in the subcutaneous fat measuring at least 8.2 cm, extending from the rectus sheath to the skin surface, suspected to represent hematoma along  the port site. There is mild asymmetric enlargement of  the left rectus muscle likely due to a small hematoma. 2. Possible mild hyperdense sludge within the gallbladder. Electronically Signed   By: Donavan Foil M.D.   On: 03/02/2020 17:15     Assessment:    Postoperative course complicated by subcutaneous port site hematoma that looks to be resolving but actively inflammed. Concerned about possible early infection Operative findings again reviewed. Pathology report discussed.    Plan:    1. Clindamycin prescribed for inflammation/early infection, has anaerobic and MRSA coverage. Continue any current medications. 2. Wound care discussed. 3. Activity restrictions: no lifting more than 20 pounds 4. Anticipated return to work: now. 5. Flu vaccine administered. 5. Follow up as needed.     Verita Schneiders, MD, [place] for [employer], [place]

## 2020-04-05 ENCOUNTER — Encounter: Payer: Self-pay | Admitting: Obstetrics and Gynecology

## 2020-04-05 NOTE — Patient Instructions (Signed)
Visit Information  Ms. Parsell-as a part of your Medicaid benefit, you are eligible for care management and care coordination services at no cost or copay. I was unable to reach you by phone today but would be happy to help you with your health related needs. Please feel free to call me at [phone].  A member of the Managed Medicaid care management team will reach out to you again over the next 7 days.   [name] RN, BSN   [facility] - Managed Medicaid High Risk [phone]

## 2020-04-05 NOTE — Patient Outreach (Signed)
Care Coordination  04/05/2020  Latoya Mora 1996/02/02 [id number]  An unsuccessful telephone outreach was attempted today. The patient was referred to the case management team for assistance with care management and care coordination.   Follow Up Plan: The Managed Medicaid care management team will reach out to the patient again over the next 7 days.   [name] RN, BSN [name]  [facility] - Managed Medicaid High Risk [phone]

## 2020-04-11 ENCOUNTER — Other Ambulatory Visit: Payer: Self-pay

## 2020-04-11 ENCOUNTER — Ambulatory Visit (INDEPENDENT_AMBULATORY_CARE_PROVIDER_SITE_OTHER): Payer: Medicaid Other | Admitting: Internal Medicine

## 2020-04-11 ENCOUNTER — Encounter: Payer: Self-pay | Admitting: Internal Medicine

## 2020-04-11 DIAGNOSIS — F419 Anxiety disorder, unspecified: Secondary | ICD-10-CM | POA: Diagnosis not present

## 2020-04-11 DIAGNOSIS — F32A Depression, unspecified: Secondary | ICD-10-CM | POA: Diagnosis not present

## 2020-04-11 MED ORDER — BUPROPION HCL ER (XL) 150 MG PO TB24: 150.0000 mg | ORAL_TABLET | Freq: Every day | ORAL | 2 refills | Status: DC

## 2020-04-11 NOTE — Progress Notes (Signed)
Subjective:    Patient ID: Latoya Mora, female    DOB: 13-Jun-1996, 24 y.o.   MRN: [id number]  HPI  Pt presents to the clinic today for follow up anxiety and depression. She is currently taking Venlafaxine but reports the side effect of excessive sweating has been unbearable. She also reports loss of sex drive, worsening depression. She is now having issues falling asleep. She has tried Unisom OTC with minimal relief of symptoms. She has tried Buspar, Hydroxyzine and Ambien in the past. She is not currently seeing a therapist. She denies SI/HI.   Review of Systems      Past Medical History:  Diagnosis Date  . Allergy   . Anemia    during pregnancies  . Anxiety   . Depression   . Elevated liver enzymes    with both pregnancies, no problems after pregnancies  . Frequent headaches    no longer having frequent headaches  . History of pre-eclampsia   . PCOS (polycystic ovarian syndrome)     Current Outpatient Medications  Medication Sig Dispense Refill  . acetaminophen (TYLENOL) 500 MG tablet Take 1,000 mg by mouth every 6 (six) hours as needed for moderate pain or headache.    . docusate sodium (COLACE) 100 MG capsule Take 1 capsule (100 mg total) by mouth 2 (two) times daily as needed for mild constipation or moderate constipation. (Patient not taking: Reported on 03/08/2020) 30 capsule 2  . ibuprofen (ADVIL) 600 MG tablet Take 1 tablet (600 mg total) by mouth every 6 (six) hours as needed for headache, mild pain, moderate pain or cramping. 30 tablet 2  . Melatonin 3 MG CAPS Take 3 mg by mouth at bedtime as needed (sleep).     Marland Kitchen oxyCODONE (OXY IR/ROXICODONE) 5 MG immediate release tablet Take 1 tablet (5 mg total) by mouth every 4 (four) hours as needed for severe pain or breakthrough pain. (Patient not taking: Reported on 03/08/2020) 30 tablet 0  . venlafaxine XR (EFFEXOR XR) 150 MG 24 hr capsule Take 2 capsules (300 mg total) by mouth daily with breakfast. 60 capsule 2   No  current facility-administered medications for this visit.    Allergies  Allergen Reactions  . Etodolac Nausea Only  . Meloxicam Other (See Comments)    Abdominal pain  . Flexeril [Cyclobenzaprine] Nausea Only  . Nsaids Other (See Comments)    Stomach pain. Pt ok with Motrin  . Prozac [Fluoxetine Hcl] Other (See Comments)    Felt bad/awful  . Hydroxyzine Nausea Only    Family History  Problem Relation Age of Onset  . Depression Mother   . Anxiety disorder Mother   . Heart disease Mother   . Skin cancer Mother   . Scoliosis Mother   . Alcohol abuse Father   . Bipolar disorder Father   . Depression Father   . Osteochondroma Father   . COPD Maternal Grandmother   . Scoliosis Maternal Grandmother   . COPD Maternal Grandfather   . Skin cancer Maternal Grandfather   . Parkinson's disease Maternal Grandfather   . Arthritis Paternal Grandmother   . Diabetes Paternal Grandmother   . Bipolar disorder Paternal Grandfather   . Osteochondroma Sister   . Osteochondroma Brother   . Cancer Neg Hx     Social History   Socioeconomic History  . Marital status: Married    Spouse name: Marilynn Ekstein  . Number of children: Not on file  . Years of education: Not on file  .  Highest education level: Not on file  Occupational History  . Not on file  Tobacco Use  . Smoking status: Never Smoker  . Smokeless tobacco: Never Used  Vaping Use  . Vaping Use: Never used  Substance and Sexual Activity  . Alcohol use: Not Currently  . Drug use: No  . Sexual activity: Yes    Birth control/protection: None  Other Topics Concern  . Not on file  Social History Narrative  . Not on file   Social Determinants of Health   Financial Resource Strain:   . Difficulty of Paying Living Expenses: Not on file  Food Insecurity:   . Worried About Charity fundraiser in the Last Year: Not on file  . Ran Out of Food in the Last Year: Not on file  Transportation Needs:   . Lack of Transportation  (Medical): Not on file  . Lack of Transportation (Non-Medical): Not on file  Physical Activity:   . Days of Exercise per Week: Not on file  . Minutes of Exercise per Session: Not on file  Stress:   . Feeling of Stress : Not on file  Social Connections:   . Frequency of Communication with Friends and Family: Not on file  . Frequency of Social Gatherings with Friends and Family: Not on file  . Attends Religious Services: Not on file  . Active Member of Clubs or Organizations: Not on file  . Attends Archivist Meetings: Not on file  . Marital Status: Not on file  Intimate Partner Violence:   . Fear of Current or Ex-Partner: Not on file  . Emotionally Abused: Not on file  . Physically Abused: Not on file  . Sexually Abused: Not on file     Constitutional: Denies fever, malaise, fatigue, headache or abrupt weight changes.  Respiratory: Denies difficulty breathing, shortness of breath, cough or sputum production.   Cardiovascular: Denies chest pain, chest tightness, palpitations or swelling in the hands or feet.  Neurological: Pt reports insomnia. Denies dizziness, difficulty with memory, difficulty with speech or problems with balance and coordination.  Psych: Pt has a history of anxiety and depression. Denies SI/HI.  No other specific complaints in a complete review of systems (except as listed in HPI above).  Objective:   Physical Exam  BP (!) 128/92   Pulse 90   Temp 98 F (36.7 C) (Temporal)   Wt (!) 336 lb (152.4 kg)   SpO2 97%   BMI 51.09 kg/m   Wt Readings from Last 3 Encounters:  03/08/20 (!) 339 lb 3.2 oz (153.9 kg)  03/02/20 (!) 333 lb (151 kg)  02/19/20 (!) 333 lb 8 oz (151.3 kg)    General: Appears her stated age, obese, in NAD. Cardiovascular: Normal rate. Pulmonary/Chest: Normal effort and positive vesicular breath sounds. No respiratory distress. No wheezes, rales or ronchi noted.  Neurological: Alert and oriented.  Psychiatric: Mood and affect  mildly flat. Behavior is normal. Judgment and thought content normal.    BMET    Component Value Date/Time   NA 137 03/02/2020 1336   NA 135 07/28/2019 0955   K 4.1 03/02/2020 1336   CL 103 03/02/2020 1336   CO2 25 03/02/2020 1336   GLUCOSE 90 03/02/2020 1336   BUN 8 03/02/2020 1336   BUN 4 (L) 07/28/2019 0955   CREATININE 0.52 03/02/2020 1336   CALCIUM 8.9 03/02/2020 1336   GFRNONAA >60 03/02/2020 1336   GFRAA >60 03/02/2020 1336    Lipid Panel  No results found for: CHOL, TRIG, HDL, CHOLHDL, VLDL, LDLCALC  CBC    Component Value Date/Time   WBC 10.6 (H) 03/02/2020 1336   RBC 5.26 (H) 03/02/2020 1336   HGB 11.8 (L) 03/02/2020 1336   HGB 11.4 06/09/2019 0813   HCT 40.3 03/02/2020 1336   HCT 37.0 06/09/2019 0813   PLT 301 03/02/2020 1336   PLT 262 06/09/2019 0813   MCV 76.6 (L) 03/02/2020 1336   MCV 73 (L) 06/09/2019 0813   MCH 22.4 (L) 03/02/2020 1336   MCHC 29.3 (L) 03/02/2020 1336   RDW 17.2 (H) 03/02/2020 1336   RDW 17.0 (H) 06/09/2019 0813   LYMPHSABS 2.6 03/02/2020 1336   LYMPHSABS 2.5 02/09/2019 1408   MONOABS 0.7 03/02/2020 1336   EOSABS 0.2 03/02/2020 1336   EOSABS 0.2 02/09/2019 1408   BASOSABS 0.1 03/02/2020 1336   BASOSABS 0.0 02/09/2019 1408    Hgb A1C Lab Results  Component Value Date   HGBA1C 5.2 02/27/2019            Assessment & Plan:    Webb Silversmith, NP This visit occurred during the SARS-CoV-2 public health emergency.  Safety protocols were in place, including screening questions prior to the visit, additional usage of staff PPE, and extensive cleaning of exam room while observing appropriate contact time as indicated for disinfecting solutions.

## 2020-04-11 NOTE — Patient Instructions (Signed)
Hi Latoya Mora  - as a part of your Medicaid benefit, you are eligible for care management and care coordination services at no cost or copay. I was unable to reach you by phone today but would be happy to help you with your health related needs. Please feel free to call me at [phone].  A member of the Managed Medicaid care management team will reach out to you again over the next 7 days.   [name] RN, BSN [place]  [facility] - Managed Medicaid High Risk [phone]

## 2020-04-11 NOTE — Patient Instructions (Signed)

## 2020-04-11 NOTE — Patient Outreach (Signed)
Care Coordination  04/11/2020  Latoya Mora 05/11/1996 [id number]  A second unsuccessful telephone outreach was attempted today. The patient was referred to the case management team for assistance with care management and care coordination.   Follow Up Plan: The Managed Medicaid care management team will reach out to the patient again over the next 7 days.   [name] RN, BSN [place]  [facility] - Managed Medicaid High Risk [phone]

## 2020-04-11 NOTE — Assessment & Plan Note (Signed)
Persistent Did not do well with increase to 300 mg of Venlafaxine She has already gone back down to 150 mg daily Will trial Wellbutrin 150 mg PO daily She has a good sleep routine- quiet, dark room Consider referral to psychiatry for med management She will look at [email or web address] for some counseling as she has not able to get it in person until after the end of the year. Support offered

## 2020-04-18 ENCOUNTER — Other Ambulatory Visit: Payer: Self-pay | Admitting: Obstetrics and Gynecology

## 2020-04-18 NOTE — Patient Instructions (Signed)
Latoya Mora  - as a part of your Medicaid benefit, you are eligible for care management and care coordination services at no cost or copay. I was unable to reach you by phone today but would be happy to help you with your health related needs. Please feel free to call me at [phone].   A member of the Managed Medicaid care management team will reach out to you again over the next 7 days.   [name] RN, BSN [place]  [facility] - Managed Medicaid High Risk [phone]

## 2020-04-18 NOTE — Patient Outreach (Addendum)
Care Coordination  04/18/2020  Errica Dutil 09/01/1995 [id number]  Third unsuccessful telephone outreach was attempted today. The patient was referred to the case management team for assistance with care management and care coordination. The patient's primary care provider has been notified of our unsuccessful attempts to make or maintain contact with the patient. The care management team is pleased to engage with this patient at any time in the future should he/she be interested in assistance from the care management team.   [name] RN, [facility] [facility] Management Coordinator - Managed [place] High Risk [phone]

## 2020-05-03 ENCOUNTER — Encounter: Payer: Self-pay | Admitting: Obstetrics & Gynecology

## 2020-06-14 ENCOUNTER — Other Ambulatory Visit: Payer: Self-pay | Admitting: Internal Medicine

## 2020-07-13 ENCOUNTER — Other Ambulatory Visit: Payer: Self-pay | Admitting: Internal Medicine

## 2020-07-30 ENCOUNTER — Telehealth: Payer: Medicaid Other | Admitting: Physician Assistant

## 2020-07-30 ENCOUNTER — Encounter: Payer: Self-pay | Admitting: Physician Assistant

## 2020-07-30 DIAGNOSIS — M545 Low back pain, unspecified: Secondary | ICD-10-CM

## 2020-07-30 MED ORDER — METHOCARBAMOL 500 MG PO TABS: 500.0000 mg | ORAL_TABLET | Freq: Every evening | ORAL | 0 refills | Status: DC

## 2020-07-30 NOTE — Progress Notes (Signed)

## 2020-07-31 ENCOUNTER — Telehealth: Payer: Medicaid Other | Admitting: Family

## 2020-07-31 ENCOUNTER — Encounter: Payer: Self-pay | Admitting: Internal Medicine

## 2020-07-31 DIAGNOSIS — M549 Dorsalgia, unspecified: Secondary | ICD-10-CM

## 2020-07-31 DIAGNOSIS — R109 Unspecified abdominal pain: Secondary | ICD-10-CM

## 2020-07-31 NOTE — Progress Notes (Signed)
Based on what you shared with me, I feel your condition warrants further evaluation and I recommend that you be seen for a face to face office visit.  Given your symptoms, you need to be seen face to face to be evaluated.    NOTE: If you entered your credit card information for this eVisit, you will not be charged. You may see a "hold" on your card for the $35 but that hold will drop off and you will not have a charge processed.   If you are having a true medical emergency please call 911.      For an urgent face to face visit, [facility] has five urgent care centers for your convenience:     [facility] [place] [place] Get Driving Directions [phone] [address] [place], [place] [address] . 10 am - 6pm Monday - Friday    [facility]) Get Driving Directions [phone] [address] [place], [place] [address] . 10 am to 8 pm Monday-Friday . 12 pm to 8 pm [facility] Get Driving Directions [phone] [place], [place], [place] [address] . 8 am to 8 pm Monday-Friday . 9 am to 6 pm Saturday . 11 am to 6 pm Sunday     [facility] Get Driving Directions  [phone] [address] [place], [place] [address] . 8 am to 8 pm Monday-Friday . 8 am to 4 pm [facility] at [place] Get Driving Directions [phone] [address] [place], [place] [address] . 12 pm to 6 pm Monday-Friday      Your e-visit answers were reviewed by a board certified advanced clinical practitioner to complete your personal care plan.  Thank you for using e-Visits.

## 2020-08-01 DIAGNOSIS — S39012A Strain of muscle, fascia and tendon of lower back, initial encounter: Secondary | ICD-10-CM | POA: Diagnosis not present

## 2020-10-17 ENCOUNTER — Other Ambulatory Visit: Payer: Self-pay | Admitting: Internal Medicine

## 2020-10-18 ENCOUNTER — Telehealth: Payer: Self-pay

## 2020-10-18 NOTE — Telephone Encounter (Signed)
Latoya Mora to Me   3:28 PM I would like to stay at [facility] under a new provider, female if possible. My new phone number is [phone]. I have tried to updated it but was unsuccessful

## 2020-10-18 NOTE — Telephone Encounter (Signed)
Last seen 03/2020 and plans on staying at [facility] for care   Did update # in chart given from pt

## 2020-10-18 NOTE — Telephone Encounter (Signed)
Pt called about refills on these two medications. Pt states that she only has one venlafaxine XR (EFFEXOR-XR) 150 MG 24 hr capsule left and no buPROPion (WELLBUTRIN XL) 150 MG 24 hr tablet left. She states that she could go into withdrawals and needs these called in asap. She would also like a call when it has been sent over.

## 2020-10-18 NOTE — Telephone Encounter (Signed)
Tried to call pt number in chart said it is not an active number... unable to lmovm.... We need to know if pt plans on moving her care with Rollene Fare to [facility] or will she be seeking care with a new primary physician as Rollene Fare is no longer an active provider at this office.

## 2020-10-28 ENCOUNTER — Telehealth: Payer: Medicaid Other | Admitting: Physician Assistant

## 2020-10-28 DIAGNOSIS — R300 Dysuria: Secondary | ICD-10-CM

## 2020-10-28 MED ORDER — CEPHALEXIN 500 MG PO CAPS: 500.0000 mg | ORAL_CAPSULE | Freq: Two times a day (BID) | ORAL | 0 refills | Status: AC

## 2020-10-28 NOTE — Progress Notes (Signed)

## 2020-10-28 NOTE — Progress Notes (Signed)
I have spent 5 minutes in review of e-visit questionnaire, review and updating patient chart, medical decision making and response to patient.   Auburn Hester Cody Hazem Kenner, PA-C    

## 2020-12-12 ENCOUNTER — Telehealth: Payer: Medicaid Other | Admitting: Physician Assistant

## 2020-12-12 DIAGNOSIS — M5441 Lumbago with sciatica, right side: Secondary | ICD-10-CM

## 2020-12-12 DIAGNOSIS — M5442 Lumbago with sciatica, left side: Secondary | ICD-10-CM

## 2020-12-13 MED ORDER — BACLOFEN 10 MG PO TABS: 10.0000 mg | ORAL_TABLET | Freq: Three times a day (TID) | ORAL | 0 refills | Status: DC

## 2020-12-13 NOTE — Progress Notes (Signed)
I have spent 5 minutes in review of e-visit questionnaire, review and updating patient chart, medical decision making and response to patient.   Seeley Hissong Cody Eyleen Rawlinson, PA-C    

## 2020-12-13 NOTE — Progress Notes (Signed)
We are sorry that you are not feeling well.  Here is how we plan to help!  Based on what you have shared with me it looks like you mostly have acute back pain.  Acute back pain is defined as musculoskeletal pain that can resolve in 1-3 weeks with conservative treatment.  I have prescribed Baclofen 10 mg every eight hours as needed which is a muscle relaxer . You can take your Tramadol as directed with this along with OTC Ibuprofen. If not resolving, or anything worsens, I recommend being seen in person for a detailed exam just to make sure no further assessment is needed, and so they can give you an injectable medicine in office to help calm things down further.   Some patients experience stomach irritation or in increased heartburn with anti-inflammatory drugs.  Please keep in mind that muscle relaxer's can cause fatigue and should not be taken while at work or driving.  Back pain is very common.  The pain often gets better over time.  The cause of back pain is usually not dangerous.  Most people can learn to manage their back pain on their own.  Home Care Stay active.  Start with short walks on flat ground if you can.  Try to walk farther each day. Do not sit, drive or stand in one place for more than 30 minutes.  Do not stay in bed. Do not avoid exercise or work.  Activity can help your back heal faster. Be careful when you bend or lift an object.  Bend at your knees, keep the object close to you, and do not twist. Sleep on a firm mattress.  Lie on your side, and bend your knees.  If you lie on your back, put a pillow under your knees. Only take medicines as told by your doctor. Put ice on the injured area. Put ice in a plastic bag Place a towel between your skin and the bag Leave the ice on for 15-20 minutes, 3-4 times a day for the first 2-3 days. 210 After that, you can switch between ice and heat packs. Ask your doctor about back exercises or massage. Avoid feeling anxious or stressed.   Find good ways to deal with stress, such as exercise.  Get Help Right Way If: Your pain does not go away with rest or medicine. Your pain does not go away in 1 week. You have new problems. You do not feel well. The pain spreads into your legs. You cannot control when you poop (bowel movement) or pee (urinate) You feel sick to your stomach (nauseous) or throw up (vomit) You have belly (abdominal) pain. You feel like you may pass out (faint). If you develop a fever.  Make Sure you: Understand these instructions. Will watch your condition Will get help right away if you are not doing well or get worse.  Your e-visit answers were reviewed by a board certified advanced clinical practitioner to complete your personal care plan.  Depending on the condition, your plan could have included both over the counter or prescription medications.  If there is a problem please reply  once you have received a response from your provider.  Your safety is important to [place].  If you have drug allergies check your prescription carefully.    You can use MyChart to ask questions about today's visit, request a non-urgent call back, or ask for a work or school excuse for 24 hours related to this e-Visit. If it has been  greater than 24 hours you will need to follow up with your provider, or enter a new e-Visit to address those concerns.  You will get an e-mail in the next two days asking about your experience.  I hope that your e-visit has been valuable and will speed your recovery. Thank you for using e-visits.

## 2020-12-20 ENCOUNTER — Ambulatory Visit (INDEPENDENT_AMBULATORY_CARE_PROVIDER_SITE_OTHER): Payer: Medicaid Other | Admitting: Obstetrics & Gynecology

## 2020-12-20 ENCOUNTER — Encounter: Payer: Self-pay | Admitting: Obstetrics & Gynecology

## 2020-12-20 ENCOUNTER — Other Ambulatory Visit: Payer: Self-pay

## 2020-12-20 VITALS — BP 146/91 | HR 105 | Ht 68.0 in | Wt 338.0 lb

## 2020-12-20 DIAGNOSIS — R6882 Decreased libido: Secondary | ICD-10-CM

## 2020-12-20 DIAGNOSIS — R102 Pelvic and perineal pain: Secondary | ICD-10-CM | POA: Diagnosis not present

## 2020-12-20 NOTE — Progress Notes (Signed)
GYNECOLOGY OFFICE VISIT NOTE  History:   Latoya Mora is a 25 y.o. [phone] here today for evaluation of pelvic pain especially on the left side. She is s/p laparoscopic LSO for 10 cm dermoid in 01/2020. Reports having postoperative pain but this got better. She then started having more pain especially during her periods.  Pain can be bilateral but mostly on her left side. No GI or GU symptoms. Patient also reports having low libido, wants to know what she can do about this.  She denies any abnormal vaginal discharge, bleeding or other concerns.    Past Medical History:  Diagnosis Date   Allergy    Anemia    during pregnancies   Anxiety    Depression    Elevated liver enzymes    with both pregnancies, no problems after pregnancies   Frequent headaches    no longer having frequent headaches   History of pre-eclampsia    PCOS (polycystic ovarian syndrome)     Past Surgical History:  Procedure Laterality Date   LAPAROSCOPIC SALPINGO OOPHERECTOMY Left 02/23/2020   Procedure: LAPAROSCOPIC SALPINGO OOPHORECTOMY;  Surgeon: Osborne Oman, MD;  Location: [facility];  Service: Gynecology;  Laterality: Left;   TOOTH EXTRACTION     local anesthestic only    The following portions of the patient's history were reviewed and updated as appropriate: allergies, current medications, past family history, past medical history, past social history, past surgical history and problem list.   Health Maintenance:  Normal pap in 10/2017 at [facility].    Review of Systems:  Pertinent items noted in HPI and remainder of comprehensive ROS otherwise negative.  Physical Exam:  BP (!) 146/91   Pulse (!) 105   Ht 5\' 8"  (1.727 m)   Wt (!) 338 lb (153.3 kg)   BMI 51.39 kg/m  CONSTITUTIONAL: Well-developed, well-nourished female in no acute distress.  HEENT:  Normocephalic, atraumatic. External right and left ear normal. No scleral icterus.  NECK: Normal range of motion, supple, no masses noted  on observation SKIN: No rash noted. Not diaphoretic. No erythema. No pallor. MUSCULOSKELETAL: Normal range of motion. No edema noted. NEUROLOGIC: Alert and oriented to person, place, and time. Normal muscle tone coordination. No cranial nerve deficit noted. PSYCHIATRIC: Normal mood and affect. Normal behavior. Normal judgment and thought content. CARDIOVASCULAR: Normal heart rate noted RESPIRATORY: Effort and breath sounds normal, no problems with respiration noted ABDOMEN: Mild LLQ tenderness to deep palpation, no rebound or guarding. Well healed incision, no subcutaneous or other masses palpated. PELVIC: Deferred by patient as she just started her period and has usual heavy bleeding. Declined pap smear today.      Assessment and Plan:     1. Pelvic pain in female Unsure etiology for her pain, will start evaluation with pelvic ultrasound. If negative, may need CT imaging to evaluate other pelvic organs/structures. Advised to take NSAIDs as needed for now for pain. - US PELVIC COMPLETE WITH TRANSVAGINAL; Future  2. Low libido Patient was counseled about Addyi, information given to her to review. She will let us know if she wants to try this and it can be prescribed for her.   Routine preventative health maintenance measures emphasized; she declined pap smear today but will get this next visit. Please refer to After Visit Summary for other counseling recommendations.   Return in about 3 weeks (around 01/10/2021) for Followup ultrasound result and perform pap smear.    I spent 18 minutes dedicated to  the care of this patient including pre-visit review of records, face to face time with the patient discussing her conditions and treatments and post visit ordering of testing.    Verita Schneiders, MD, [place] for [employer], [place]

## 2020-12-22 ENCOUNTER — Ambulatory Visit
Admission: RE | Admit: 2020-12-22 | Discharge: 2020-12-22 | Disposition: A | Discharge status: Home / Self Care | Payer: Medicaid Other | Source: Ambulatory Visit | Attending: Obstetrics & Gynecology | Admitting: Obstetrics & Gynecology

## 2020-12-22 ENCOUNTER — Other Ambulatory Visit: Payer: Self-pay

## 2020-12-22 DIAGNOSIS — R102 Pelvic and perineal pain: Secondary | ICD-10-CM | POA: Diagnosis not present

## 2021-01-12 ENCOUNTER — Ambulatory Visit: Payer: Medicaid Other | Admitting: Obstetrics & Gynecology

## 2021-01-26 ENCOUNTER — Other Ambulatory Visit: Payer: Self-pay | Admitting: Family

## 2021-01-26 ENCOUNTER — Telehealth: Payer: Self-pay | Admitting: *Deleted

## 2021-01-26 NOTE — Telephone Encounter (Signed)
[facility] - Client TELEPHONE ADVICE RECORD [facility]urse Patient Name: Latoya Mora ISE Gender: Female DOB: 1995/10/20 Age: 25 Y 7 M 29 D Return Phone Number: [phone] (Primary), [phone] (Secondary) Address: [place]/ [place]/ [place] [address] Client [facility] - Client Client Site [place] - [facility] - PHYSICIAN, Verita Schneiders- MD Contact Type Call Who Is Calling Patient / Member / Family / Caregiver Call Type Triage / Clinical Caller Name Tynette Gagan Relationship To Patient Spouse Return Phone Number [phone] (Secondary) Chief Complaint Anxiety and Panic Attack Reason for Call Symptomatic / Request for [place] states his is calling for his wife. She is out of her antidepressant medication and she is having an episode. The office has no openings today or tomorrow. Her old dr has left and she has not been given a new doc. Office did confirm this. Additional Comment She is having a panic attack and is really anxious. She has been off of it for 4 days now. Her medication is Bupropion XL '150mg'$ , and she takes it once a day. Translation No Nurse Assessment Nurse: Claiborne Billings, RN, Kim Date/Time (Eastern Time): 01/26/2021 4:08:25 PM Confirm and document reason for call. If symptomatic, describe symptoms. ---Caller states his wife ran out of Bupropion XL 4 days ago and she is having a lot of anxiety and panic sxs, "having a hard time controlling her emotions". States his wife forgot to call for the refill. Does the patient have any new or worsening symptoms? ---Yes Will a triage be completed? ---Yes Related visit to physician within the last 2 weeks? ---N/A Does the PT have any chronic conditions? (i.e. diabetes, asthma, this includes High risk factors for pregnancy, etc.) ---Yes List chronic conditions. ---Depression, Anxiety Is the patient pregnant  or possibly pregnant? (Ask all females between the ages of 79-55) ---No Is this a behavioral health or substance abuse call? ---Yes Are you having any thoughts or feelings of harming or killing yourself or someone else? ---No Are you currently experiencing any physical discomfort that you think may be related to the use ---No PLEASE NOTE: All timestamps contained within this report are represented as [place] Standard Time. CONFIDENTIALTY NOTICE: This fax transmission is intended only for the addressee. It contains information that is legally privileged, confidential or otherwise protected from use or disclosure. If you are not the intended recipient, you are strictly prohibited from reviewing, disclosing, copying using or disseminating any of this information or taking any action in reliance on or regarding this information. If you have received this fax in error, please notify us immediately by telephone so that we can arrange for its return to [place]. Phone: [phone], Toll-Free: [phone], Fax: [phone] Page: 2 of 3 Call Id: [id number] Nurse Assessment of alcohol or other drugs? (use substance abuse or alcohol abuse guidelines. These include withdrawal symptoms) Do you worry that you may be hearing or seeing things that others do not? ---No Do you take medications for your condition(s)? ---Yes List medications here. ---Bupropion XL, "she takes others but she's not out of them" Nurse: Claiborne Billings, RN, Maudie Mercury Date/Time ([place] Time): 01/26/2021 4:21:41 PM Please select the assessment type ---Refill Does the patient have enough medication to last until the office opens? ---No Additional Documentation ---Caller advised to request loaner dose of med and ask pharmacy to submit refill request to office; he verbalizes understanding. Guidelines Guideline Title Affirmed Question Affirmed Notes Nurse Date/Time (Eastern Time) Anxiety and  Panic Attack Patient sounds very upset or troubled  to the triager Claiborne Billings, RN, Kim 01/26/2021 4:12:08 PM Disp. Time Eilene Ghazi Time) Disposition Final User 01/26/2021 4:21:19 PM See PCP within 24 Hours Yes Claiborne Billings, RN, Max Sane Disagree/Comply Comply Caller Understands Yes PreDisposition Did not know what to do Care Advice Given Per Guideline SEE PCP WITHIN 24 HOURS: * IF OFFICE WILL BE OPEN: You need to be examined within the next 24 hours. Call your doctor (or NP/PA) when the office opens and make an appointment. CALL BACK IF: * You feel like harming yourself * You become worse CARE ADVICE given per Anxiety and Panic Attack (Adult) guideline. Comments User: Suezanne Jacquet, RN Date/Time Eilene Ghazi Time): 01/26/2021 4:23:25 PM PLEASE NOTE: All timestamps contained within this report are represented as [place] Standard Time. CONFIDENTIALTY NOTICE: This fax transmission is intended only for the addressee. It contains information that is legally privileged, confidential or otherwise protected from use or disclosure. If you are not the intended recipient, you are strictly prohibited from reviewing, disclosing, copying using or disseminating any of this information or taking any action in reliance on or regarding this information. If you have received this fax in error, please notify us immediately by telephone so that we can arrange for its return to [place]. Phone: [phone], Toll-Free: [phone], Fax: [phone] Page: 3 of 3 Call Id: [id number] Comments Caller provided with outcome for wife to be seen within 24 hrs; advised him that since the office sent memo that there are no appts available today or tomorrow that he needs to take wife to [facility] tomorrow and he agrees to do so. In the meantime, he will ask pharmacy for a loaner dose of her med. Referrals GO TO FACILITY UNDECIDED

## 2021-01-26 NOTE — Telephone Encounter (Signed)
I spoke with pt; pt has been out of med for 4 -5 days (bupropion). Pt has been taking venlafaxine but pt said she is having a hard time. Pt said that pt has triggers that causes a panic attack; pt could not identify triggers this time. Pt said she had a panic attack about 1 1/2 hrs ago. Pt said she used to take two effexors 150 mg but now only takes one at hs due to bad side effects of paranoia . Pt thought there were bed bugs in her house but there were no bed bugs. Pt said that Avie Echevaria NP told  her to decrease the effexor to one daily and that has been doing better until pt has not had time to get bupropion filled. Pt will speak with pharmacy about emergency supply until gets refill from [facility]. Pt will call on 01/27/21 to get TOC to a provider at [facility]. Pt said no SI/HI. UC & ED precautions given and pt voiced understanding.sending note to [place] pool.

## 2021-01-26 NOTE — Telephone Encounter (Signed)
Josh left voicemail on triage line stating he was calling on behalf of his wife Latoya Mora and requested a call back.  Returned call and got voicemail.  I left message for Josh to return call to office so we can get more information.

## 2021-01-27 DIAGNOSIS — F32A Depression, unspecified: Secondary | ICD-10-CM

## 2021-01-27 DIAGNOSIS — F419 Anxiety disorder, unspecified: Secondary | ICD-10-CM

## 2021-01-27 MED ORDER — VENLAFAXINE HCL ER 150 MG PO CP24: 150.0000 mg | ORAL_CAPSULE | Freq: Every day | ORAL | 1 refills | Status: DC

## 2021-01-27 MED ORDER — BUPROPION HCL ER (XL) 150 MG PO TB24: 150.0000 mg | ORAL_TABLET | Freq: Every day | ORAL | 1 refills | Status: DC

## 2021-02-01 ENCOUNTER — Telehealth: Payer: Medicaid Other | Admitting: Physician Assistant

## 2021-02-01 DIAGNOSIS — R3989 Other symptoms and signs involving the genitourinary system: Secondary | ICD-10-CM

## 2021-02-01 MED ORDER — CEPHALEXIN 500 MG PO CAPS: 500.0000 mg | ORAL_CAPSULE | Freq: Two times a day (BID) | ORAL | 0 refills | Status: DC

## 2021-02-01 NOTE — Progress Notes (Signed)

## 2021-02-07 NOTE — Telephone Encounter (Signed)
Called pt to schedule appt. Pt did not answer so I left a voicemail. 

## 2021-02-23 ENCOUNTER — Telehealth: Payer: Medicaid Other | Admitting: Nurse Practitioner

## 2021-02-23 ENCOUNTER — Telehealth: Payer: Medicaid Other | Admitting: Family Medicine

## 2021-02-23 DIAGNOSIS — Z5329 Procedure and treatment not carried out because of patient's decision for other reasons: Secondary | ICD-10-CM

## 2021-02-23 DIAGNOSIS — Z91199 Patient's noncompliance with other medical treatment and regimen due to unspecified reason: Secondary | ICD-10-CM

## 2021-02-23 DIAGNOSIS — U071 COVID-19: Secondary | ICD-10-CM

## 2021-02-23 NOTE — Progress Notes (Signed)
Latoya Mora,  We are sorry to hear that you are not feeling well.  Since your COVID-19 symptoms started less than 5 days ago you may need a prescription for FDA-approved treatments (pills or IV therapy), which we cannot prescribe through an e-Visit. The best way for our providers to make a decision about which COVID treatment is right for you is through a Virtual Urgent Care Visit.  If you would like to discuss COVID therapy options with a provider, cancel this e-Visit and access a Virtual Urgent Care Visit from our [place] menu.   If you choose to reschedule as a Virtual Visit, you will not be charged for the e-Visit, just let us know if that is what you would like to do.   If you are not interested in anti-viral medication and are looking for general direction on COVID management we can continue with the E-visit.   Please note we cannot prescribe pain medications that are stronger than over the counter pain medications. Virtual visits have the same restrictions, we cannot prescribe controlled pain medications. If you have needed that level of pain management for migraines in the past you would need to go to an Urgent Care or Emergency Room.      E-Visit  for Positive Covid Test Result  We are sorry you are not feeling well. We are here to help!  You have tested positive for COVID-19, meaning that you were infected with the novel coronavirus and could give the virus to others.  It is vitally important that you stay home so you do not spread it to others.      Please continue isolation at home, for at least 5 days since the start of your symptoms and until you have had 24 hours with no fever (without taking a fever reducer) and with improving of symptoms.  If you have no symptoms but tested positive (or all symptoms resolve after 5 days and you have no fever) you can leave your house but continue to wear a mask around others for an additional 5 days. If you have a fever,continue to stay home until  you have had 24 hours of no fever. Most cases improve 5-10 days from onset but we have seen a small number of patients who have gotten worse after the 10 days.  Please be sure to watch for worsening symptoms and remain taking the proper precautions.   Go to the nearest hospital ED for assessment if fever/cough/breathlessness are severe or illness seems like a threat to life.    The following symptoms may appear 2-14 days after exposure: Fever Cough Shortness of breath or difficulty breathing Chills Repeated shaking with chills Muscle pain Headache Sore throat New loss of taste or smell Fatigue Congestion or runny nose Nausea or vomiting Diarrhea  You have been enrolled in [place] for COVID-19. Daily you will receive a questionnaire within the [place] website. Our COVID-19 response team will be monitoring your responses daily.  You may also take acetaminophen (Tylenol) as needed for fever.  HOME CARE: Only take medications as instructed by your medical team. Drink plenty of fluids and get plenty of rest. A steam or ultrasonic humidifier can help if you have congestion.   GET HELP RIGHT AWAY IF YOU HAVE EMERGENCY WARNING SIGNS.  Call 911 or proceed to your closest emergency facility if: You develop worsening high fever. Trouble breathing Bluish lips or face Persistent pain or pressure in the chest New confusion Inability to wake or stay  awake You cough up blood. Your symptoms become more severe Inability to hold down food or fluids  This list is not all possible symptoms. Contact your medical provider for any symptoms that are severe or concerning to you.    Your e-visit answers were reviewed by a board certified advanced clinical practitioner to complete your personal care plan.  Depending on the condition, your plan could have included both over the counter or prescription medications.  If there is a problem please reply once you have received a response  from your provider.  Your safety is important to [place].  If you have drug allergies check your prescription carefully.    You can use MyChart to ask questions about today's visit, request a non-urgent call back, or ask for a work or school excuse for 24 hours related to this e-Visit. If it has been greater than 24 hours you will need to follow up with your provider, or enter a new e-Visit to address those concerns. You will get an e-mail in the next two days asking about your experience.  I hope that your e-visit has been valuable and will speed your recovery. Thank you for using e-visits.

## 2021-02-23 NOTE — Progress Notes (Signed)
NS

## 2021-06-02 ENCOUNTER — Emergency Department (HOSPITAL_COMMUNITY)
Admission: EM | Admit: 2021-06-02 | Discharge: 2021-06-02 | Disposition: A | Discharge status: Home / Self Care | Payer: Medicaid Other | Attending: Emergency Medicine | Admitting: Emergency Medicine

## 2021-06-02 ENCOUNTER — Emergency Department (HOSPITAL_COMMUNITY): Payer: Medicaid Other

## 2021-06-02 ENCOUNTER — Other Ambulatory Visit: Payer: Self-pay

## 2021-06-02 ENCOUNTER — Encounter (HOSPITAL_COMMUNITY): Payer: Self-pay

## 2021-06-02 DIAGNOSIS — K8020 Calculus of gallbladder without cholecystitis without obstruction: Secondary | ICD-10-CM | POA: Insufficient documentation

## 2021-06-02 DIAGNOSIS — R1011 Right upper quadrant pain: Secondary | ICD-10-CM | POA: Diagnosis not present

## 2021-06-02 LAB — COMPREHENSIVE METABOLIC PANEL
ALT: 18 U/L (ref 0–44)
AST: 16 U/L (ref 15–41)
Albumin: 4.2 g/dL (ref 3.5–5.0)
Alkaline Phosphatase: 113 U/L (ref 38–126)
Anion gap: 6 (ref 5–15)
BUN: 7 mg/dL (ref 6–20)
CO2: 26 mmol/L (ref 22–32)
Calcium: 8.8 mg/dL — ABNORMAL LOW (ref 8.9–10.3)
Chloride: 102 mmol/L (ref 98–111)
Creatinine, Ser: 0.58 mg/dL (ref 0.44–1.00)
GFR, Estimated: >60 mL/min (ref >60)
Glucose, Bld: 93 mg/dL (ref 70–99)
Potassium: 4 mmol/L (ref 3.5–5.1)
Sodium: 134 mmol/L — ABNORMAL LOW (ref 135–145)
Total Bilirubin: 0.1 mg/dL — ABNORMAL LOW (ref 0.3–1.2)
Total Protein: 8.9 g/dL — ABNORMAL HIGH (ref 6.5–8.1)

## 2021-06-02 LAB — CBC
HCT: 40.4 % (ref 36.0–46.0)
Hemoglobin: 11.8 g/dL — ABNORMAL LOW (ref 12.0–15.0)
MCH: 22.3 pg — ABNORMAL LOW (ref 26.0–34.0)
MCHC: 29.2 g/dL — ABNORMAL LOW (ref 30.0–36.0)
MCV: 76.5 fL — ABNORMAL LOW (ref 80.0–100.0)
Platelets: 271 10*3/uL (ref 150–400)
RBC: 5.28 MIL/uL — ABNORMAL HIGH (ref 3.87–5.11)
RDW: 17.4 % — ABNORMAL HIGH (ref 11.5–15.5)
WBC: 8.7 10*3/uL (ref 4.0–10.5)
nRBC: 0 % (ref 0.0–0.2)

## 2021-06-02 LAB — LIPASE, BLOOD: Lipase: 30 U/L (ref 11–51)

## 2021-06-02 MED ORDER — ONDANSETRON HCL 4 MG PO TABS: 4.0000 mg | ORAL_TABLET | Freq: Three times a day (TID) | ORAL | 0 refills | Status: DC | PRN

## 2021-06-02 NOTE — ED Triage Notes (Signed)
Pt to er, pt states that last night she started having some pain under her ribs, states that this am the pain started getting worse, states that she also started having some nausea,  states that the pain is starting to get a little better, states that the pain is RUQ and positioning makes it worse.

## 2021-06-02 NOTE — Discharge Instructions (Addendum)
Your abdominal pain is likely due to gallstone pain.  Fortunately no evidence of inflamed gallbladder at this time however please eat bland food, avoid greasy fatty food as it can triggers your pain.  Call and follow-up closely with general surgery for further managements of your condition.  Return if your symptoms worsen.

## 2021-06-02 NOTE — ED Provider Notes (Signed)
[facility] EMERGENCY DEPARTMENT Provider Note   CSN: [id number] Arrival date & time: 06/02/21  1004     History Chief Complaint  Patient presents with   Abdominal Pain    Latoya Mora is a 25 y.o. female.  The history is provided by the patient. No language interpreter was used.  Abdominal Pain  25 year old female significant history of PCOS, obesity presenting for evaluation of abdominal pain.  Patient reports she developed pain to her right upper quadrant that started early this morning.  She described pain as a sharp sensation across abdomen radiates towards her back that became very intense, felt nauseous and nearly vomited.  Pain has since subsided and is minimal at this time.  However when it was intense it caused her great concerns.  No associated fever or chills no runny nose sneezing or coughing no chest pain or shortness of breath no dysuria hematuria no constipation or diarrhea.  She ate a cheeseburger last night.  She reported history of elevated liver enzyme while she was pregnant in the past.  She still has an intact gallbladder.  She is about to start her menstruation.  She thought the pain could possibly be due to early menstrual cramp.  Past Medical History:  Diagnosis Date   Allergy    Anemia    during pregnancies   Anxiety    Depression    Elevated liver enzymes    with both pregnancies, no problems after pregnancies   Frequent headaches    no longer having frequent headaches   History of pre-eclampsia    PCOS (polycystic ovarian syndrome)     Patient Active Problem List   Diagnosis Date Noted   PCOS (polycystic ovarian syndrome) 10/03/2017   Morbid obesity ([place]) 04/03/2017   Anxiety and depression 09/18/2016    Past Surgical History:  Procedure Laterality Date   LAPAROSCOPIC SALPINGO OOPHERECTOMY Left 02/23/2020   Procedure: LAPAROSCOPIC SALPINGO OOPHORECTOMY;  Surgeon: Osborne Oman, MD;  Location: [facility];  Service: Gynecology;  Laterality:  Left;   TOOTH EXTRACTION     local anesthestic only     OB History     Gravida  2   Para  2   Term  2   Preterm  0   AB  0   Living  2      SAB  0   IAB  0   Ectopic  0   Multiple  0   Live Births  2           Family History  Problem Relation Age of Onset   Depression Mother    Anxiety disorder Mother    Heart disease Mother    Skin cancer Mother    Scoliosis Mother    Alcohol abuse Father    Bipolar disorder Father    Depression Father    Osteochondroma Father    COPD Maternal Grandmother    Scoliosis Maternal Grandmother    COPD Maternal Grandfather    Skin cancer Maternal Grandfather    Parkinson's disease Maternal Grandfather    Arthritis Paternal Grandmother    Diabetes Paternal Grandmother    Bipolar disorder Paternal Grandfather    Osteochondroma Sister    Osteochondroma Brother    Cancer Neg Hx     Social History   Tobacco Use   Smoking status: Never   Smokeless tobacco: Never  Vaping Use   Vaping Use: Never used  Substance Use Topics   Alcohol use: Not Currently  Drug use: No    Home Medications Prior to Admission medications   Medication Sig Start Date End Date Taking? Authorizing Provider  acetaminophen (TYLENOL) 500 MG tablet Take 1,000 mg by mouth every 6 (six) hours as needed for moderate pain or headache.    [provider]  buPROPion (WELLBUTRIN XL) 150 MG 24 hr tablet Take 1 tablet (150 mg total) by mouth daily. 01/27/21   Anyanwu, Sallyanne Havers, MD  cephALEXin (KEFLEX) 500 MG capsule Take 1 capsule (500 mg total) by mouth 2 (two) times daily. 02/01/21   Mar Daring, PA-C  ibuprofen (ADVIL) 600 MG tablet Take 1 tablet (600 mg total) by mouth every 6 (six) hours as needed for headache, mild pain, moderate pain or cramping. 02/23/20   Anyanwu, Sallyanne Havers, MD  Melatonin 3 MG CAPS Take 3 mg by mouth at bedtime as needed (sleep).     [provider]  venlafaxine XR (EFFEXOR-XR) 150 MG 24 hr capsule Take 1  capsule (150 mg total) by mouth daily with breakfast. 01/27/21   Anyanwu, Sallyanne Havers, MD    Allergies    Etodolac, Meloxicam, Flexeril [cyclobenzaprine], Nsaids, Prozac [fluoxetine hcl], and Hydroxyzine  Review of Systems   Review of Systems  Gastrointestinal:  Positive for abdominal pain.  All other systems reviewed and are negative.  Physical Exam Updated Vital Signs BP (!) 138/105 (BP Location: Right Arm)   Pulse 90   Temp 98.3 F (36.8 C) (Oral)   Resp 18   Ht 5\' 8"  (1.727 m)   Wt (!) 152 kg   SpO2 100%   BMI 50.94 kg/m   Physical Exam Vitals and nursing note reviewed.  Constitutional:      General: She is not in acute distress.    Appearance: She is well-developed. She is obese.  HENT:     Head: Atraumatic.  Eyes:     Conjunctiva/sclera: Conjunctivae normal.  Cardiovascular:     Rate and Rhythm: Normal rate and regular rhythm.     Heart sounds: Normal heart sounds.  Pulmonary:     Effort: Pulmonary effort is normal.  Abdominal:     General: Bowel sounds are normal.     Palpations: Abdomen is soft.     Tenderness: There is no abdominal tenderness. There is no guarding or rebound.  Musculoskeletal:     Cervical back: Neck supple.  Skin:    Findings: No rash.  Neurological:     Mental Status: She is alert.  Psychiatric:        Mood and Affect: Mood normal.    ED Results / Procedures / Treatments   Labs (all labs ordered are listed, but only abnormal results are displayed) Labs Reviewed  COMPREHENSIVE METABOLIC PANEL - Abnormal; Notable for the following components:      Result Value   Sodium 134 (*)    Calcium 8.8 (*)    Total Protein 8.9 (*)    Total Bilirubin 0.1 (*)    All other components within normal limits  CBC - Abnormal; Notable for the following components:   RBC 5.28 (*)    Hemoglobin 11.8 (*)    MCV 76.5 (*)    MCH 22.3 (*)    MCHC 29.2 (*)    RDW 17.4 (*)    All other components within normal limits  LIPASE, BLOOD  URINALYSIS, ROUTINE  W REFLEX MICROSCOPIC  POC URINE PREG, ED    EKG None  Radiology US Abdomen Limited  Result Date: 06/02/2021 CLINICAL  DATA:  Right upper quadrant pain EXAM: ULTRASOUND ABDOMEN LIMITED RIGHT UPPER QUADRANT COMPARISON:  CT AP 03/02/2020 FINDINGS: Gallbladder: Gallstones noted. The largest measures 1.2 cm. No gallbladder wall thickening, pericholecystic fluid or sonographic Murphy's sign Common bile duct: Diameter: 3.2 mm Liver: No focal lesion identified. Increased parenchymal echogenicity. Portal vein is patent on color Doppler imaging with normal direction of blood flow towards the liver. Other: None. IMPRESSION: 1. No acute findings. 2. Increased parenchymal echogenicity of the liver suggesting hepatic steatosis. 3. Gallstones. Electronically Signed   By: Kerby Moors M.D.   On: 06/02/2021 12:48    Procedures Procedures   Medications Ordered in ED Medications - No data to display  ED Course  I have reviewed the triage vital signs and the nursing notes.  Pertinent labs & imaging results that were available during my care of the patient were reviewed by me and considered in my medical decision making (see chart for details).    MDM Rules/Calculators/A&P                           BP (!) 138/105 (BP Location: Right Arm)   Pulse 90   Temp 98.3 F (36.8 C) (Oral)   Resp 18   Ht 5\' 8"  (1.727 m)   Wt (!) 152 kg   SpO2 100%   BMI 50.94 kg/m   Final Clinical Impression(s) / ED Diagnoses Final diagnoses:  Calculus of gallbladder without cholecystitis without obstruction    Rx / DC Orders ED Discharge Orders     None     Patient presents complaining of pain to the right upper quadrant suggestive of potential biliary disease.  Will obtain limited abdominal ultrasound for further assessment.  Pain medication offered the patient declined at this time.  1:56 PM Labs are reassuring, elevated abdominal ultrasound obtained today without any acute finding however there is evidence  of gallstones without evidence of cholecystitis.  Evidence of increased parenchymal echogenicity of the liver suggesting hepatic steatosis.  Given this finding, will give patient referral to [facility].  Recommend patient to avoid with greasy fatty contents which can significantly worsened her symptom.  Return precaution given.   Domenic Moras, PA-C 06/02/21 1406    Fredia Sorrow, MD 06/08/21 [phone]

## 2021-06-05 ENCOUNTER — Telehealth: Payer: Self-pay

## 2021-06-05 NOTE — Telephone Encounter (Signed)
Transition Care Management Unsuccessful Follow-up Telephone Call  Date of discharge and from where:  06/02/2021 from [facility]  Attempts:  1st Attempt  Reason for unsuccessful TCM follow-up call:  Unable to leave message

## 2021-06-06 NOTE — Telephone Encounter (Signed)
Transition Care Management Unsuccessful Follow-up Telephone Call  Date of discharge and from where:  06/02/2021 from [facility]  Attempts:  2nd Attempt  Reason for unsuccessful TCM follow-up call:  Unable to leave message

## 2021-06-07 NOTE — Telephone Encounter (Signed)
Transition Care Management Unsuccessful Follow-up Telephone Call  Date of discharge and from where:  06/02/2021 from [facility]  Attempts:  3rd Attempt  Reason for unsuccessful TCM follow-up call:  Unable to reach patient

## 2021-07-25 ENCOUNTER — Telehealth: Payer: Medicaid Other | Admitting: Physician Assistant

## 2021-07-25 DIAGNOSIS — M545 Low back pain, unspecified: Secondary | ICD-10-CM

## 2021-07-25 MED ORDER — BACLOFEN 10 MG PO TABS: 10.0000 mg | ORAL_TABLET | Freq: Three times a day (TID) | ORAL | 0 refills | Status: DC

## 2021-07-25 NOTE — Progress Notes (Signed)
I have spent 5 minutes in review of e-visit questionnaire, review and updating patient chart, medical decision making and response to patient.   Odetta Forness Cody Fernado Brigante, PA-C    

## 2021-07-25 NOTE — Progress Notes (Signed)
We are sorry that you are not feeling well.  Here is how we plan to help!  Based on what you have shared with me it looks like you mostly have acute back pain.  Acute back pain is defined as musculoskeletal pain that can resolve in 1-3 weeks with conservative treatment.  I have prescribed  Baclofen 10 mg every eight hours as needed which is a muscle relaxer. Since you cannot tolerate NSAIDs, you can use extra-strength tylenol OTC with this.   .  Please keep in mind that muscle relaxer's can cause fatigue and should not be taken while at work or driving.  Back pain is very common.  The pain often gets better over time.  The cause of back pain is usually not dangerous.  Most people can learn to manage their back pain on their own.  Home Care Stay active.  Start with short walks on flat ground if you can.  Try to walk farther each day. Do not sit, drive or stand in one place for more than 30 minutes.  Do not stay in bed. Do not avoid exercise or work.  Activity can help your back heal faster. Be careful when you bend or lift an object.  Bend at your knees, keep the object close to you, and do not twist. Sleep on a firm mattress.  Lie on your side, and bend your knees.  If you lie on your back, put a pillow under your knees. Only take medicines as told by your doctor. Put ice on the injured area. Put ice in a plastic bag Place a towel between your skin and the bag Leave the ice on for 15-20 minutes, 3-4 times a day for the first 2-3 days. 210 After that, you can switch between ice and heat packs. Ask your doctor about back exercises or massage. Avoid feeling anxious or stressed.  Find good ways to deal with stress, such as exercise.  Get Help Right Way If: Your pain does not go away with rest or medicine. Your pain does not go away in 1 week. You have new problems. You do not feel well. The pain spreads into your legs. You cannot control when you poop (bowel movement) or pee (urinate) You  feel sick to your stomach (nauseous) or throw up (vomit) You have belly (abdominal) pain. You feel like you may pass out (faint). If you develop a fever.  Make Sure you: Understand these instructions. Will watch your condition Will get help right away if you are not doing well or get worse.  Your e-visit answers were reviewed by a board certified advanced clinical practitioner to complete your personal care plan.  Depending on the condition, your plan could have included both over the counter or prescription medications.  If there is a problem please reply  once you have received a response from your provider.  Your safety is important to [place].  If you have drug allergies check your prescription carefully.    You can use MyChart to ask questions about todays visit, request a non-urgent call back, or ask for a work or school excuse for 24 hours related to this e-Visit. If it has been greater than 24 hours you will need to follow up with your provider, or enter a new e-Visit to address those concerns.  You will get an e-mail in the next two days asking about your experience.  I hope that your e-visit has been valuable and will speed your recovery. Thank you  for using e-visits.

## 2021-09-01 IMAGING — US US EXTREM LOW VENOUS*L*
1 series · 13 of 24 positions shown · non-contrast
Comparison: None.

CLINICAL DATA: Left lower extremity pain for the past 2 days.
Patient is currently 15 weeks pregnant. Evaluate for DVT.



[Series 1: us extrem low venous*left* · 13 of 36 slices shown]
[im 1/36]
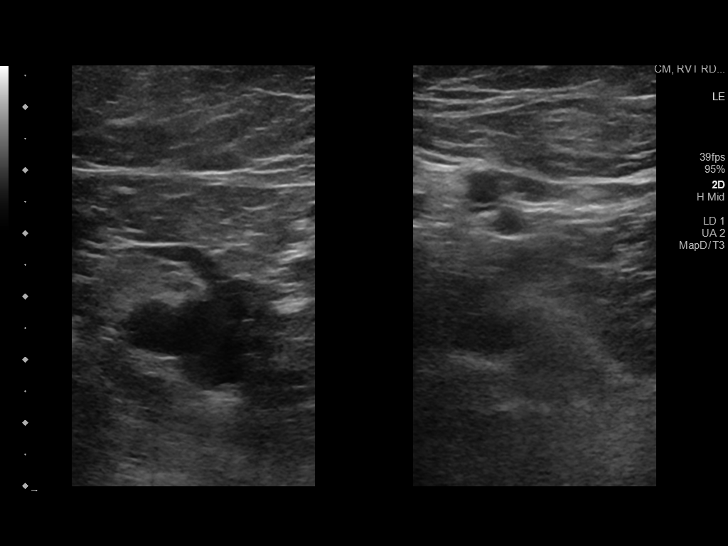
[im 4/36]
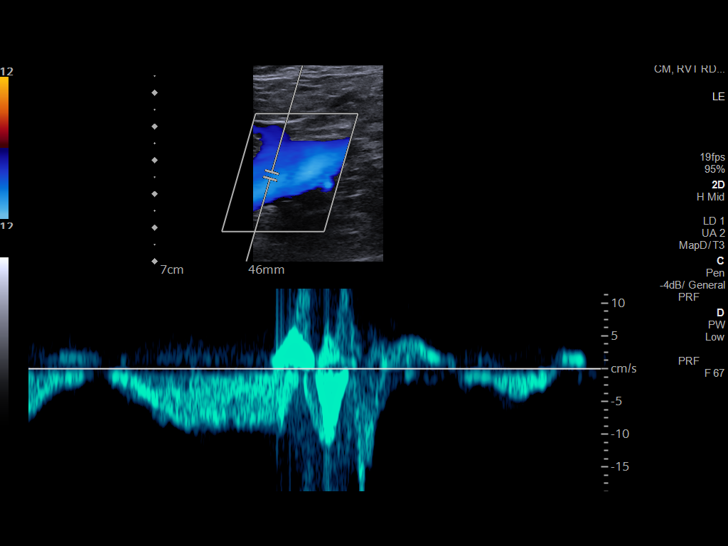
[im 7/36]
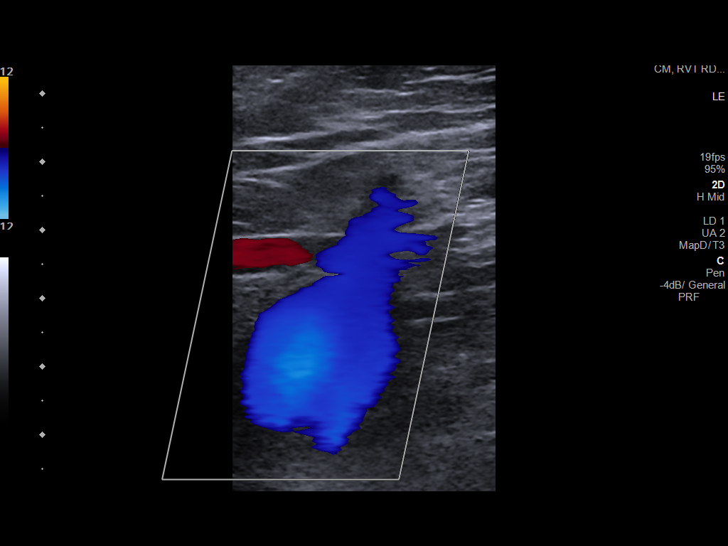
[im 10/36]
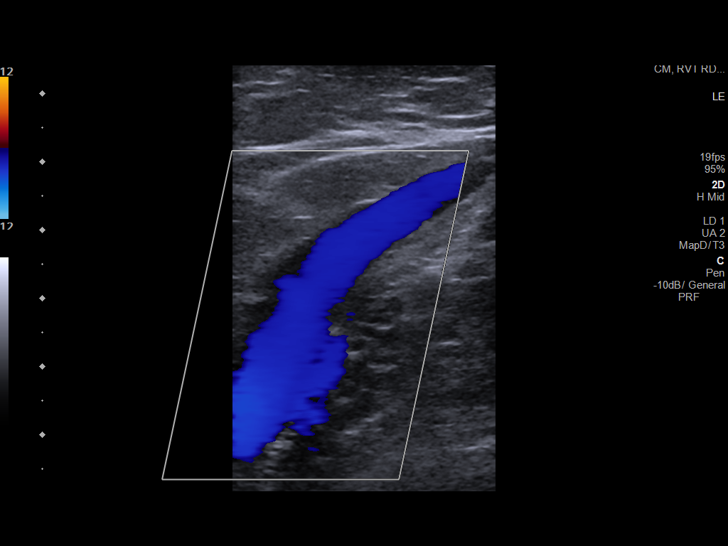
[im 13/36]
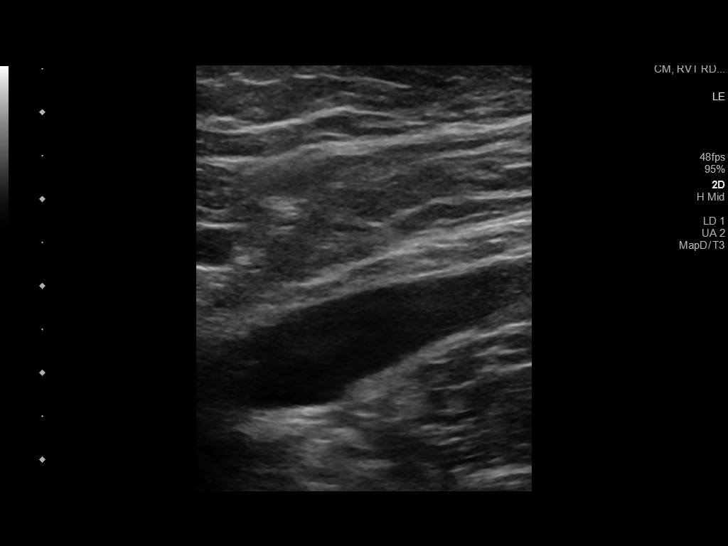
[im 16/36]
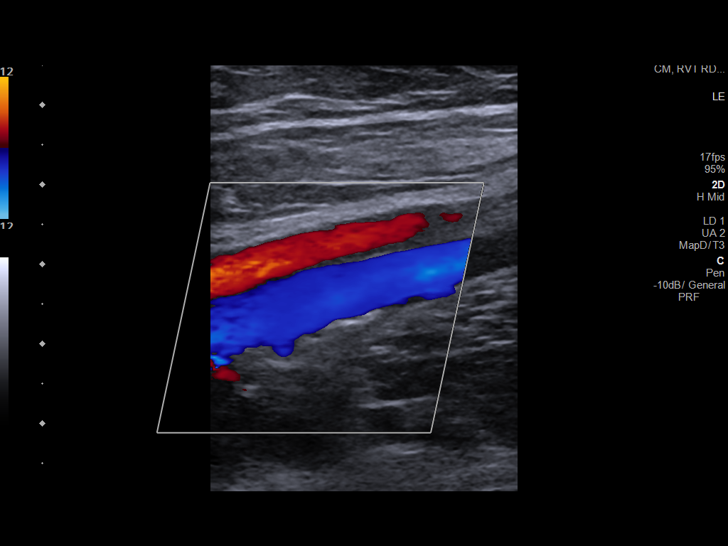
[im 19/36]
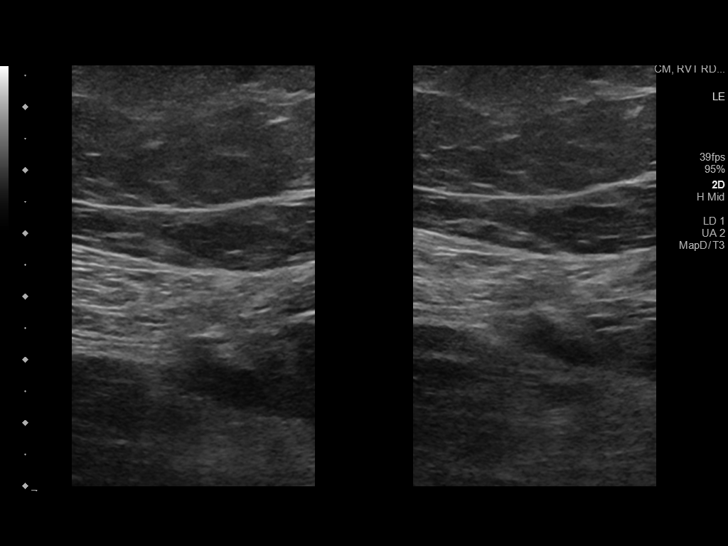
[im 20/36]
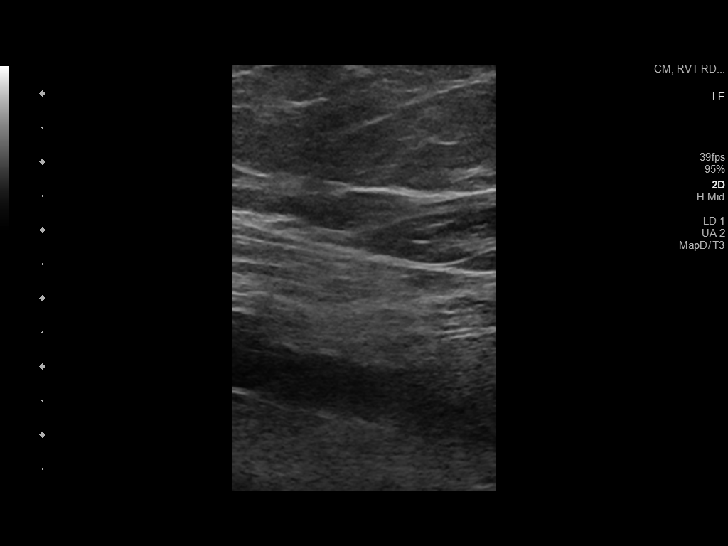
[im 23/36]
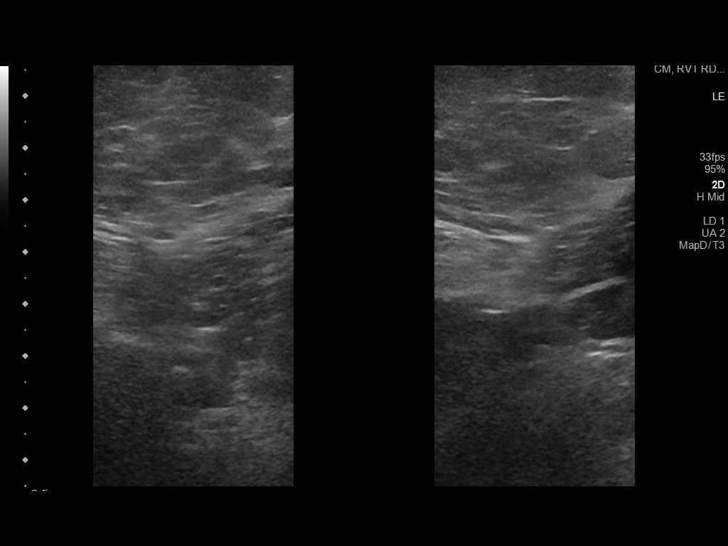
[im 26/36]
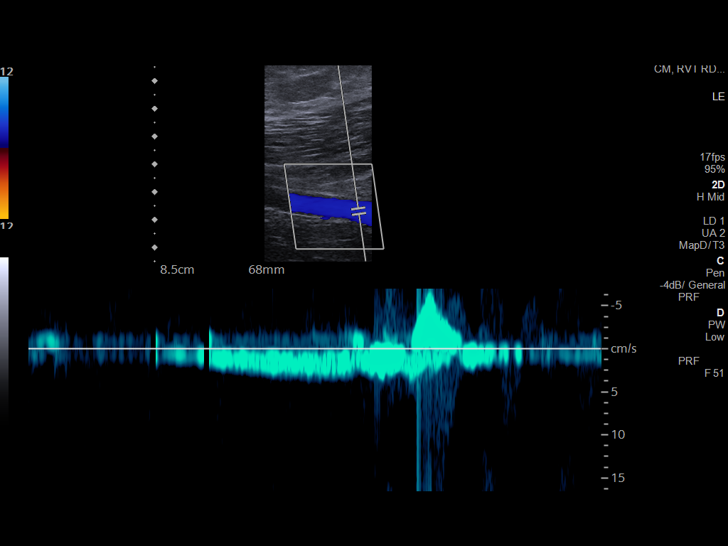
[im 29/36]
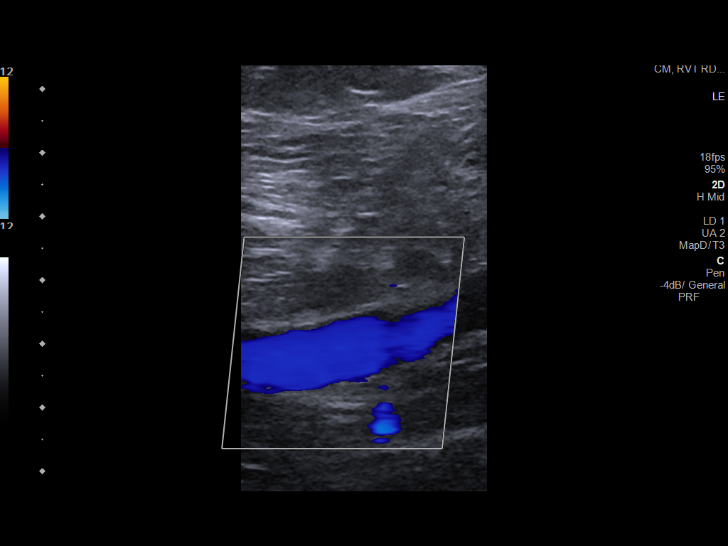
[im 32/36]
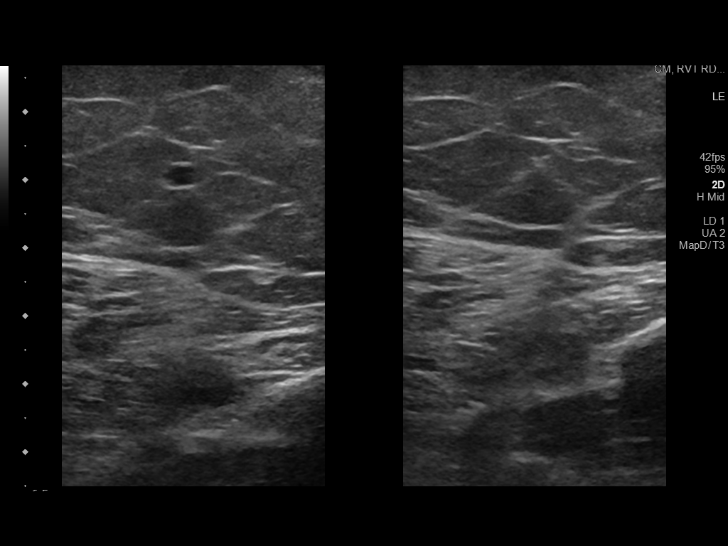
[im 36/36]
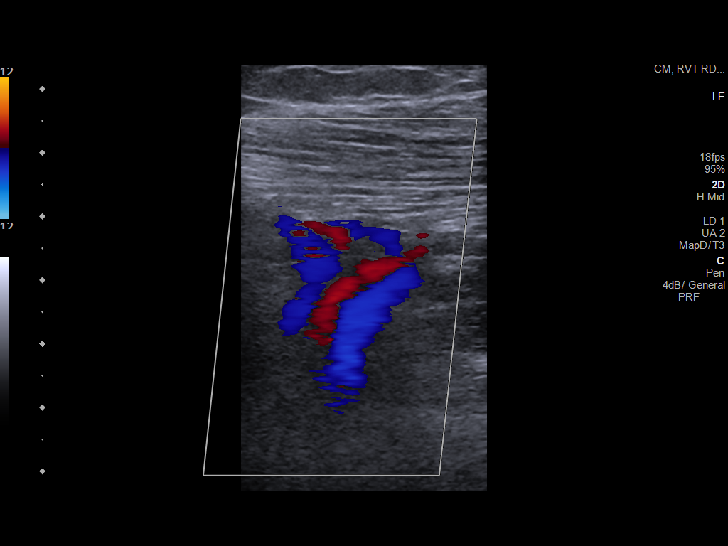

[13 of 24 positions shown; findings below may reference images not displayed]

FINDINGS: Contralateral Common Femoral Vein: Respiratory phasicity is normal
and symmetric with the symptomatic side. No evidence of thrombus.
Normal compressibility.

Common Femoral Vein: No evidence of thrombus. Normal
compressibility, respiratory phasicity and response to augmentation.

Saphenofemoral Junction: No evidence of thrombus. Normal
compressibility and flow on color Doppler imaging.

Profunda Femoral Vein: No evidence of thrombus. Normal
compressibility and flow on color Doppler imaging.

Femoral Vein: No evidence of thrombus. Normal compressibility,
respiratory phasicity and response to augmentation.

Popliteal Vein: No evidence of thrombus. Normal compressibility,
respiratory phasicity and response to augmentation.

Calf Veins: No evidence of thrombus. Normal compressibility and flow
on color Doppler imaging.

Superficial Great Saphenous Vein: No evidence of thrombus. Normal
compressibility.

Venous Reflux:  None.

Other Findings:  None.
IMPRESSION: No evidence of DVT within the left lower extremity.

## 2021-09-02 ENCOUNTER — Telehealth: Payer: Medicaid Other | Admitting: Nurse Practitioner

## 2021-09-02 DIAGNOSIS — M549 Dorsalgia, unspecified: Secondary | ICD-10-CM | POA: Diagnosis not present

## 2021-09-02 DIAGNOSIS — M79606 Pain in leg, unspecified: Secondary | ICD-10-CM

## 2021-09-02 MED ORDER — PREDNISONE 20 MG PO TABS: 40.0000 mg | ORAL_TABLET | Freq: Every day | ORAL | 0 refills | Status: AC

## 2021-09-02 MED ORDER — BACLOFEN 10 MG PO TABS: 10.0000 mg | ORAL_TABLET | Freq: Three times a day (TID) | ORAL | 0 refills | Status: DC

## 2021-09-02 NOTE — Progress Notes (Signed)
We are sorry that you are not feeling well.  Here is how we plan to help! ? ?Based on what you have shared with me it looks like you mostly have acute back pain. ? ?Acute back pain is defined as musculoskeletal pain that can resolve in 1-3 weeks with conservative treatment. ? ?I have prescribed prednisone '20mg'$  2 pilss at sametime daily for 5 days as well as Baclofen 10 mg every eight hours as needed which is a muscle relaxer  Some patients experience stomach irritation or in increased heartburn with anti-inflammatory drugs.  Please keep in mind that muscle relaxer's can cause fatigue and should not be taken while at work or driving.  Back pain is very common.  The pain often gets better over time.  The cause of back pain is usually not dangerous.  Most people can learn to manage their back pain on their own. ? ?Home Care ?Stay active.  Start with short walks on flat ground if you can.  Try to walk farther each day. ?Do not sit, drive or stand in one place for more than 30 minutes.  Do not stay in bed. ?Do not avoid exercise or work.  Activity can help your back heal faster. ?Be careful when you bend or lift an object.  Bend at your knees, keep the object close to you, and do not twist. ?Sleep on a firm mattress.  Lie on your side, and bend your knees.  If you lie on your back, put a pillow under your knees. ?Only take medicines as told by your doctor. ?Put ice on the injured area. ?Put ice in a plastic bag ?Place a towel between your skin and the bag ?Leave the ice on for 15-20 minutes, 3-4 times a day for the first 2-3 days. 210 After that, you can switch between ice and heat packs. ?Ask your doctor about back exercises or massage. ?Avoid feeling anxious or stressed.  Find good ways to deal with stress, such as exercise. ? ?Get Help Right Way If: ?Your pain does not go away with rest or medicine. ?Your pain does not go away in 1 week. ?You have new problems. ?You do not feel well. ?The pain spreads into your  legs. ?You cannot control when you poop (bowel movement) or pee (urinate) ?You feel sick to your stomach (nauseous) or throw up (vomit) ?You have belly (abdominal) pain. ?You feel like you may pass out (faint). ?If you develop a fever. ? ?Make Sure you: ?Understand these instructions. ?Will watch your condition ?Will get help right away if you are not doing well or get worse. ? ?Your e-visit answers were reviewed by a board certified advanced clinical practitioner to complete your personal care plan.  Depending on the condition, your plan could have included both over the counter or prescription medications. ? ?If there is a problem please reply  once you have received a response from your provider. ? ?Your safety is important to [place].  If you have drug allergies check your prescription carefully.   ? ?You can use MyChart to ask questions about today?s visit, request a non-urgent call back, or ask for a work or school excuse for 24 hours related to this e-Visit. If it has been greater than 24 hours you will need to follow up with your provider, or enter a new e-Visit to address those concerns. ? ?You will get an e-mail in the next two days asking about your experience.  I hope that your e-visit has  been valuable and will speed your recovery. Thank you for using e-visits.  ? ?5-10 minutes spent reviewing and documenting in chart. ? ?

## 2021-09-29 ENCOUNTER — Encounter (HOSPITAL_COMMUNITY): Payer: Self-pay | Admitting: Emergency Medicine

## 2021-09-29 ENCOUNTER — Emergency Department (HOSPITAL_COMMUNITY): Payer: Medicaid Other

## 2021-09-29 ENCOUNTER — Emergency Department (HOSPITAL_COMMUNITY)
Admission: EM | Admit: 2021-09-29 | Discharge: 2021-09-29 | Disposition: A | Discharge status: Home / Self Care | Payer: Medicaid Other | Attending: Emergency Medicine | Admitting: Emergency Medicine

## 2021-09-29 ENCOUNTER — Other Ambulatory Visit: Payer: Self-pay

## 2021-09-29 DIAGNOSIS — N939 Abnormal uterine and vaginal bleeding, unspecified: Secondary | ICD-10-CM | POA: Diagnosis not present

## 2021-09-29 DIAGNOSIS — D271 Benign neoplasm of left ovary: Secondary | ICD-10-CM | POA: Diagnosis not present

## 2021-09-29 DIAGNOSIS — N920 Excessive and frequent menstruation with regular cycle: Secondary | ICD-10-CM | POA: Diagnosis not present

## 2021-09-29 DIAGNOSIS — R102 Pelvic and perineal pain: Secondary | ICD-10-CM | POA: Diagnosis not present

## 2021-09-29 LAB — BASIC METABOLIC PANEL
Anion gap: 9 (ref 5–15)
BUN: 10 mg/dL (ref 6–20)
CO2: 25 mmol/L (ref 22–32)
Calcium: 8.7 mg/dL — ABNORMAL LOW (ref 8.9–10.3)
Chloride: 102 mmol/L (ref 98–111)
Creatinine, Ser: 0.68 mg/dL (ref 0.44–1.00)
GFR, Estimated: >60 mL/min (ref >60)
Glucose, Bld: 113 mg/dL — ABNORMAL HIGH (ref 70–99)
Potassium: 3.9 mmol/L (ref 3.5–5.1)
Sodium: 136 mmol/L (ref 135–145)

## 2021-09-29 LAB — CBC WITH DIFFERENTIAL/PLATELET
Abs Immature Granulocytes: 0.04 10*3/uL (ref 0.00–0.07)
Basophils Absolute: 0 10*3/uL (ref 0.0–0.1)
Basophils Relative: 1 %
Eosinophils Absolute: 0.1 10*3/uL (ref 0.0–0.5)
Eosinophils Relative: 1 %
HCT: 37.6 % (ref 36.0–46.0)
Hemoglobin: 10.9 g/dL — ABNORMAL LOW (ref 12.0–15.0)
Immature Granulocytes: 1 %
Lymphocytes Relative: 32 %
Lymphs Abs: 2.3 10*3/uL (ref 0.7–4.0)
MCH: 21.6 pg — ABNORMAL LOW (ref 26.0–34.0)
MCHC: 29 g/dL — ABNORMAL LOW (ref 30.0–36.0)
MCV: 74.5 fL — ABNORMAL LOW (ref 80.0–100.0)
Monocytes Absolute: 0.3 10*3/uL (ref 0.1–1.0)
Monocytes Relative: 4 %
Neutro Abs: 4.5 10*3/uL (ref 1.7–7.7)
Neutrophils Relative %: 61 %
Platelets: 262 10*3/uL (ref 150–400)
RBC: 5.05 MIL/uL (ref 3.87–5.11)
RDW: 17 % — ABNORMAL HIGH (ref 11.5–15.5)
WBC: 7.3 10*3/uL (ref 4.0–10.5)
nRBC: 0 % (ref 0.0–0.2)

## 2021-09-29 LAB — WET PREP, GENITAL
Clue Cells Wet Prep HPF POC: NONE SEEN
Sperm: NONE SEEN
Trich, Wet Prep: NONE SEEN
Yeast Wet Prep HPF POC: NONE SEEN

## 2021-09-29 LAB — URINALYSIS, ROUTINE W REFLEX MICROSCOPIC
Bilirubin Urine: NEGATIVE
Glucose, UA: NEGATIVE mg/dL
Ketones, ur: NEGATIVE mg/dL
Leukocytes,Ua: NEGATIVE
Nitrite: NEGATIVE
Protein, ur: NEGATIVE mg/dL
Specific Gravity, Urine: 1.02 (ref 1.005–1.030)
pH: 7.5 (ref 5.0–8.0)

## 2021-09-29 LAB — URINALYSIS, MICROSCOPIC (REFLEX)
Bacteria, UA: NONE SEEN
RBC / HPF: >50 RBC/hpf (ref 0–5)

## 2021-09-29 LAB — PREGNANCY, URINE: Preg Test, Ur: NEGATIVE

## 2021-09-29 NOTE — ED Provider Notes (Signed)
?[facility] ?Provider Note ? ? ?CSN: [id number] ?Arrival date & time: 09/29/21  5188 ? ?  ? ?History ? ?Chief Complaint  ?Patient presents with  ? Vaginal Bleeding  ? ? ?Latoya Mora is a 26 y.o. female. ? ? ?Vaginal Bleeding ?Associated symptoms: no abdominal pain and no dyspareunia   ? ?  ? ?Latoya Mora is a 26 y.o. female with past medical history of PCOS who presents to the Emergency Department complaining of heavy vaginal bleeding x3 weeks at end of March, bleeding somewhat subsided then returned 3 days ago.  States that she is changing a pad every 2 hours.  She reports some mild cramping of her lower abdomen as well.  Denies any dizziness, fever, chills, dysuria, and syncope.  No generalized weakness.  Has appointment with OB/GYN for mid May.  Not currently on any birth control. ? ? ? ?Home Medications ?Prior to Admission medications   ?Medication Sig Start Date End Date Taking? Authorizing Provider  ?acetaminophen (TYLENOL) 500 MG tablet Take 1,000 mg by mouth every 6 (six) hours as needed for moderate pain or headache.    [provider]  ?baclofen (LIORESAL) 10 MG tablet Take 1 tablet (10 mg total) by mouth 3 (three) times daily. 09/02/21   Hassell Done Mary-Margaret, FNP  ?buPROPion (WELLBUTRIN XL) 150 MG 24 hr tablet Take 1 tablet (150 mg total) by mouth daily. 01/27/21   Osborne Oman, MD  ?ibuprofen (ADVIL) 600 MG tablet Take 1 tablet (600 mg total) by mouth every 6 (six) hours as needed for headache, mild pain, moderate pain or cramping. 02/23/20   Anyanwu, Sallyanne Havers, MD  ?Melatonin 3 MG CAPS Take 3 mg by mouth at bedtime as needed (sleep).     [provider]  ?ondansetron (ZOFRAN) 4 MG tablet Take 1 tablet (4 mg total) by mouth every 8 (eight) hours as needed for nausea or vomiting. 06/02/21   Domenic Moras, PA-C  ?venlafaxine XR (EFFEXOR-XR) 150 MG 24 hr capsule Take 1 capsule (150 mg total) by mouth daily with breakfast. 01/27/21   Anyanwu, Sallyanne Havers, MD  ?   ? ?Allergies     ?Etodolac, Meloxicam, Flexeril [cyclobenzaprine], Nsaids, Prozac [fluoxetine hcl], and Hydroxyzine   ? ?Review of Systems   ?Review of Systems  ?Respiratory:  Negative for shortness of breath.   ?Cardiovascular:  Negative for chest pain.  ?Gastrointestinal:  Negative for abdominal pain, diarrhea and vomiting.  ?Genitourinary:  Positive for pelvic pain and vaginal bleeding. Negative for difficulty urinating and dyspareunia.  ?All other systems reviewed and are negative. ? ?Physical Exam ?Updated Vital Signs ?Ht '5\' 8"'$  (1.727 m)   Wt (!) 152 kg   BMI 50.94 kg/m?  ?Physical Exam ?Vitals and nursing note reviewed. Exam conducted with a chaperone present.  ?Constitutional:   ?   Appearance: Normal appearance. She is not ill-appearing or toxic-appearing.  ?Cardiovascular:  ?   Rate and Rhythm: Normal rate and regular rhythm.  ?   Pulses: Normal pulses.  ?Pulmonary:  ?   Effort: Pulmonary effort is normal.  ?   Breath sounds: Normal breath sounds.  ?Abdominal:  ?   Palpations: Abdomen is soft.  ?   Tenderness: There is no abdominal tenderness.  ?Genitourinary: ?   Vagina: Bleeding present. No vaginal discharge.  ?   Cervix: No cervical motion tenderness.  ?   Uterus: Normal.   ?   Adnexa:     ?   Right: No mass or tenderness.      ?  Left: No mass or tenderness.    ?   Comments: Mild discomfort with bimanual exam.  No CMT or palpable adnexal masses.  Moderate to large amount of blood within the vaginal vault.  No clots seen. ?Musculoskeletal:     ?   General: Normal range of motion.  ?Skin: ?   General: Skin is warm.  ?Neurological:  ?   General: No focal deficit present.  ?   Mental Status: She is alert.  ?   Sensory: No sensory deficit.  ?   Motor: No weakness.  ? ? ?ED Results / Procedures / Treatments   ?Labs ?(all labs ordered are listed, but only abnormal results are displayed) ?Labs Reviewed  ?WET PREP, GENITAL - Abnormal; Notable for the following components:  ?    Result Value  ? WBC, Wet Prep HPF POC RARE (*)    ? All other components within normal limits  ?CBC WITH DIFFERENTIAL/PLATELET - Abnormal; Notable for the following components:  ? Hemoglobin 10.9 (*)   ? MCV 74.5 (*)   ? MCH 21.6 (*)   ? MCHC 29.0 (*)   ? RDW 17.0 (*)   ? All other components within normal limits  ?BASIC METABOLIC PANEL - Abnormal; Notable for the following components:  ? Glucose, Bld 113 (*)   ? Calcium 8.7 (*)   ? All other components within normal limits  ?URINALYSIS, ROUTINE W REFLEX MICROSCOPIC - Abnormal; Notable for the following components:  ? Color, Urine AMBER (*)   ? APPearance HAZY (*)   ? Hgb urine dipstick LARGE (*)   ? All other components within normal limits  ?PREGNANCY, URINE  ?URINALYSIS, MICROSCOPIC (REFLEX)  ?GC/CHLAMYDIA PROBE AMP () NOT AT [facility]  ? ? ?EKG ?None ? ?Radiology ?US PELVIC COMPLETE WITH TRANSVAGINAL ? ?Result Date: 09/29/2021 ?CLINICAL DATA:  Cramping and right pelvic pain with heavy bleeding over the last 3 days. History of left salpingo-oophorectomy in 2021. EXAM: TRANSABDOMINAL AND TRANSVAGINAL ULTRASOUND OF PELVIS TECHNIQUE: Both transabdominal and transvaginal ultrasound examinations of the pelvis were performed. Transabdominal technique was performed for global imaging of the pelvis including uterus, ovaries, adnexal regions, and pelvic cul-de-sac. It was necessary to proceed with endovaginal exam following the transabdominal exam to visualize the uterus and adnexal regions more clearly. COMPARISON:  12/22/2020 FINDINGS: Uterus Measurements: 8.6 x 4.5 x 4.6 cm = volume: 93 mL. No fibroids or other mass visualized. Endometrium Thickness: 5 mm.  Normal.  No focal abnormality visualized. Right ovary Measurements: 3.2 x 2.2 x 1.6 cm = volume: 5.9 mL. Normal appearance/no adnexal mass. Left ovary Surgically absent (history of teratoma) Other findings Trace amount of free fluid, often seen in premenopausal females. IMPRESSION: Status post left salpingo oophorectomy for treatment of teratoma. No  complicating feature seen in that region. Uterus, endometrium and right ovary appear normal wide today's study. Electronically Signed   By: Nelson Chimes M.D.   On: 09/29/2021 12:49   ? ? ?Procedures ?Procedures  ? ? ?Medications Ordered in ED ?Medications - No data to display ? ?ED Course/ Medical Decision Making/ A&P ?  ?                        ?Medical Decision Making ?Amount and/or Complexity of Data Reviewed ?Labs: ordered. ?Radiology: ordered. ? ? ?Patient here for evaluation of abnormal vaginal bleeding.  Some mild pelvic discomfort on bimanual exam.  No palpable adnexal masses. ?Labs show no evidence of leukocytosis.  Slight  drop in hemoglobin from December, no anemia.  Electrolytes without significant change wet prep without acute findings urinalysis shows no evidence of UTI.  Pregnancy test is negative.  Work-up to include pelvic ultrasound given history of PCOS and prior teratoma.  Ultrasound without evidence of acute finding ?No orthostatic hypotension. ?Discussed findings with patient.  I have offered to consult on-call GYN provider, as she may benefit from Provera challenge, but she prefers to follow-up with her OB/GYN.  Feel that she is appropriate for discharge home.  Return precautions were discussed. ? ? ? ? ? ? ? ?Final Clinical Impression(s) / ED Diagnoses ?Final diagnoses:  ?Abnormal vaginal bleeding  ? ? ?Rx / DC Orders ?ED Discharge Orders   ? ? None  ? ?  ? ? ?  ?Kem Parkinson, PA-C ?10/02/21 1319 ? ?  ?Dorie Rank, MD ?10/04/21 1522 ? ?

## 2021-09-29 NOTE — ED Notes (Signed)
Patient transported to US 

## 2021-09-29 NOTE — ED Triage Notes (Signed)
Pt reports heavy vaginal bleeding x 3 days.  ?

## 2021-09-29 NOTE — Discharge Instructions (Signed)
Please follow-up with your OB/GYN provider for recheck.  Return to the emergency department for any new or worsening symptoms. ?

## 2021-10-02 ENCOUNTER — Telehealth: Payer: Self-pay

## 2021-10-02 LAB — GC/CHLAMYDIA PROBE AMP (~~LOC~~) NOT AT ARMC
Chlamydia: NEGATIVE
Comment: NEGATIVE
Comment: NORMAL
Neisseria Gonorrhea: NEGATIVE

## 2021-10-02 NOTE — Telephone Encounter (Signed)
Transition Care Management Unsuccessful Follow-up Telephone Call ? ?Date of discharge and from where:  09/29/2021-[place]  ? ?Attempts:  1st Attempt ? ?Reason for unsuccessful TCM follow-up call:  Left voice message ? ?  ?

## 2021-10-03 NOTE — Telephone Encounter (Signed)
Transition Care Management Unsuccessful Follow-up Telephone Call ? ?Date of discharge and from where:  09/29/2021-[place]  ? ?Attempts:  2nd Attempt ? ?Reason for unsuccessful TCM follow-up call:  Left voice message ? ?  ?

## 2021-10-04 NOTE — Telephone Encounter (Signed)
Transition Care Management Unsuccessful Follow-up Telephone Call ? ?Date of discharge and from where:  09/29/2021-[place]  ? ?Attempts:  3rd Attempt ? ?Reason for unsuccessful TCM follow-up call:  Left voice message ? ?  ?

## 2021-10-20 ENCOUNTER — Ambulatory Visit (INDEPENDENT_AMBULATORY_CARE_PROVIDER_SITE_OTHER): Payer: Medicaid Other | Admitting: Podiatry

## 2021-10-20 DIAGNOSIS — B351 Tinea unguium: Secondary | ICD-10-CM | POA: Diagnosis not present

## 2021-10-20 DIAGNOSIS — Z79899 Other long term (current) drug therapy: Secondary | ICD-10-CM

## 2021-10-20 MED ORDER — TERBINAFINE HCL 250 MG PO TABS: 250.0000 mg | ORAL_TABLET | Freq: Every day | ORAL | 0 refills | Status: DC

## 2021-10-20 NOTE — Progress Notes (Signed)
? ?  Subjective: ?26 y.o. female presenting today as a new patient for evaluation of fungal nails to the left great toe as well as the third toe.  Patient states she has had discolored thickened nails to the left foot since she was 26 years old.  Most recently she has tried multiple over-the-counter and home remedies with no improvement.  She states that she has been very diligent about trying to get rid of the toenail fungus over the last 3-4 years. ? ?Past Medical History:  ?Diagnosis Date  ? Allergy   ? Anemia   ? during pregnancies  ? Anxiety   ? Depression   ? Elevated liver enzymes   ? with both pregnancies, no problems after pregnancies  ? Frequent headaches   ? no longer having frequent headaches  ? History of pre-eclampsia   ? PCOS (polycystic ovarian syndrome)   ? ?Past Surgical History:  ?Procedure Laterality Date  ? LAPAROSCOPIC SALPINGO OOPHERECTOMY Left 02/23/2020  ? Procedure: LAPAROSCOPIC SALPINGO OOPHORECTOMY;  Surgeon: Osborne Oman, MD;  Location: [facility];  Service: Gynecology;  Laterality: Left;  ? TOOTH EXTRACTION    ? local anesthestic only  ? ?Allergies  ?Allergen Reactions  ? Etodolac Nausea Only  ? Meloxicam Other (See Comments)  ?  Abdominal pain  ? Flexeril [Cyclobenzaprine] Nausea Only  ? Nsaids Other (See Comments)  ?  Stomach pain. Pt ok with Motrin  ? Prozac [Fluoxetine Hcl] Other (See Comments)  ?  Felt bad/awful  ? Hydroxyzine Nausea Only  ? ? ? ?Objective: ?Physical Exam ?General: The patient is alert and oriented x3 in no acute distress. ? ?Dermatology: Hyperkeratotic, discolored, thickened, onychodystrophy noted to the first and third digits left foot. Skin is warm, dry and supple bilateral lower extremities. Negative for open lesions or macerations. ? ?Vascular: Palpable pedal pulses bilaterally. No edema or erythema noted. Capillary refill within normal limits. ? ?Neurological: Epicritic and protective threshold grossly intact bilaterally.  ? ?Musculoskeletal Exam: Range of  motion within normal limits to all pedal and ankle joints bilateral. Muscle strength 5/5 in all groups bilateral.  ? ?Assessment: ?#1 Onychomycosis of toenails first and third digits left foot ? ?Plan of Care:  ?#1 Patient was evaluated. ?#2  Today we discussed different treatment options including oral, topical, and laser antifungal treatment modalities.  We discussed their efficacies and side effects.  Patient opts for oral antifungal treatment modality ?#3 prescription for Lamisil 250 mg #90 daily.  She denies a history of liver pathology or symptoms.  Patient is otherwise healthy ?#4  Order placed for hepatic function panel  ?#5 OTC Tolcylen antifungal topical dispensed at checkout  ?#6 return to clinic 6 months ? ? ?Edrick Kins, DPM ?[facility] ? ?Dr. Edrick Kins, DPM  ?  ?[address]                                    ?[place], [place] [address]                ?Office [phone]  ?Fax [phone] ? ? ?  ?

## 2021-10-21 LAB — HEPATIC FUNCTION PANEL
ALT: 14 IU/L (ref 0–32)
AST: 13 IU/L (ref 0–40)
Albumin: 4.1 g/dL (ref 3.9–5.0)
Alkaline Phosphatase: 119 IU/L (ref 44–121)
Bilirubin Total: 0.2 mg/dL (ref 0.0–1.2)
Bilirubin, Direct: <0.1 mg/dL (ref 0.00–0.40)
Total Protein: 7.4 g/dL (ref 6.0–8.5)

## 2021-10-26 DIAGNOSIS — H5213 Myopia, bilateral: Secondary | ICD-10-CM | POA: Diagnosis not present

## 2021-10-30 ENCOUNTER — Other Ambulatory Visit: Payer: Self-pay | Admitting: Family

## 2021-10-30 DIAGNOSIS — F419 Anxiety disorder, unspecified: Secondary | ICD-10-CM

## 2021-10-31 ENCOUNTER — Encounter: Payer: Self-pay | Admitting: Obstetrics & Gynecology

## 2021-10-31 DIAGNOSIS — F419 Anxiety disorder, unspecified: Secondary | ICD-10-CM

## 2021-10-31 MED ORDER — VENLAFAXINE HCL ER 150 MG PO CP24: 150.0000 mg | ORAL_CAPSULE | Freq: Every day | ORAL | 3 refills | Status: DC

## 2021-10-31 MED ORDER — BUPROPION HCL ER (XL) 150 MG PO TB24: 150.0000 mg | ORAL_TABLET | Freq: Every day | ORAL | 3 refills | Status: DC

## 2021-11-03 ENCOUNTER — Telehealth: Payer: Medicaid Other | Admitting: Family Medicine

## 2021-11-03 DIAGNOSIS — S39012A Strain of muscle, fascia and tendon of lower back, initial encounter: Secondary | ICD-10-CM

## 2021-11-03 MED ORDER — BACLOFEN 10 MG PO TABS: 10.0000 mg | ORAL_TABLET | Freq: Three times a day (TID) | ORAL | 0 refills | Status: AC

## 2021-11-03 NOTE — Progress Notes (Signed)
We are sorry that you are not feeling well.  Here is how we plan to help! ? ?Based on what you have shared with me it looks like you mostly have acute back pain. ? ?Acute back pain is defined as musculoskeletal pain that can resolve in 1-3 weeks with conservative treatment. ? ?I have prescribed  non-steroid anti-inflammatory (NSAID) as well as Baclofen 10 mg every eight hours as needed which is a muscle relaxer  Some patients experience stomach irritation or in increased heartburn with anti-inflammatory drugs.  Please keep in mind that muscle relaxer's can cause fatigue and should not be taken while at work or driving.  Back pain is very common.  The pain often gets better over time.  The cause of back pain is usually not dangerous.  Most people can learn to manage their back pain on their own. ? ?Home Care ?Stay active.  Start with short walks on flat ground if you can.  Try to walk farther each day. ?Do not sit, drive or stand in one place for more than 30 minutes.  Do not stay in bed. ?Do not avoid exercise or work.  Activity can help your back heal faster. ?Be careful when you bend or lift an object.  Bend at your knees, keep the object close to you, and do not twist. ?Sleep on a firm mattress.  Lie on your side, and bend your knees.  If you lie on your back, put a pillow under your knees. ?Only take medicines as told by your doctor. ?Put ice on the injured area. ?Put ice in a plastic bag ?Place a towel between your skin and the bag ?Leave the ice on for 15-20 minutes, 3-4 times a day for the first 2-3 days. 210 After that, you can switch between ice and heat packs. ?Ask your doctor about back exercises or massage. ?Avoid feeling anxious or stressed.  Find good ways to deal with stress, such as exercise. ? ?Get Help Right Way If: ?Your pain does not go away with rest or medicine. ?Your pain does not go away in 1 week. ?You have new problems. ?You do not feel well. ?The pain spreads into your legs. ?You cannot  control when you poop (bowel movement) or pee (urinate) ?You feel sick to your stomach (nauseous) or throw up (vomit) ?You have belly (abdominal) pain. ?You feel like you may pass out (faint). ?If you develop a fever. ? ?Make Sure you: ?Understand these instructions. ?Will watch your condition ?Will get help right away if you are not doing well or get worse. ? ?Your e-visit answers were reviewed by a board certified advanced clinical practitioner to complete your personal care plan.  Depending on the condition, your plan could have included both over the counter or prescription medications. ? ?If there is a problem please reply  once you have received a response from your provider. ? ?Your safety is important to [place].  If you have drug allergies check your prescription carefully.   ? ?You can use MyChart to ask questions about today?s visit, request a non-urgent call back, or ask for a work or school excuse for 24 hours related to this e-Visit. If it has been greater than 24 hours you will need to follow up with your provider, or enter a new e-Visit to address those concerns. ? ?You will get an e-mail in the next two days asking about your experience.  I hope that your e-visit has been valuable and will speed your  recovery. Thank you for using e-visits.  ? ? have provided 5 minutes of non face to face time during this encounter for chart review and documentation.   ?

## 2021-11-07 ENCOUNTER — Ambulatory Visit: Payer: Medicaid Other | Admitting: Obstetrics & Gynecology

## 2021-11-09 ENCOUNTER — Ambulatory Visit: Payer: Medicaid Other | Admitting: Nurse Practitioner

## 2021-11-09 ENCOUNTER — Encounter: Payer: Self-pay | Admitting: Nurse Practitioner

## 2021-11-09 VITALS — BP 138/85 | HR 81 | Temp 97.5°F | Ht 68.0 in | Wt 337.4 lb

## 2021-11-09 DIAGNOSIS — D649 Anemia, unspecified: Secondary | ICD-10-CM | POA: Insufficient documentation

## 2021-11-09 DIAGNOSIS — G479 Sleep disorder, unspecified: Secondary | ICD-10-CM

## 2021-11-09 DIAGNOSIS — M545 Low back pain, unspecified: Secondary | ICD-10-CM | POA: Diagnosis not present

## 2021-11-09 MED ORDER — PRAZOSIN HCL 1 MG PO CAPS: 1.0000 mg | ORAL_CAPSULE | Freq: Every day | ORAL | 0 refills | Status: DC

## 2021-11-09 MED ORDER — NAPROXEN 500 MG PO TABS: 500.0000 mg | ORAL_TABLET | Freq: Two times a day (BID) | ORAL | 0 refills | Status: AC

## 2021-11-09 NOTE — Progress Notes (Signed)
Subjective:    Patient ID: Latoya Mora, female    DOB: [birth year], 26 y.o.   MRN: [id number]  HPI  26 year old female with history of PCOS, anxiety, depression, anemia presents to the clinic today to establish care.  Patient also has concerns about her back and frequent nightmares.    Back pain  Patient states that she has been having on and off lower left-sided back pain for several years.  Patient states that baclofen used to work well but now is not working as well.  Patient denies having any injuries.  Patient states that pain feels dull and achy.  Patient states that this episode of back pain has been going on for 3 to 4 weeks.  Nightmares  Patient states that she has always had vivid dreams.  but recently her nightmares have been more intense.  Patient also admits that she has had depression and anxiety since she was younger and has been tried on several different antidepressants.  Patient states she is currently taking Wellbutrin and Effexor which seems to help but she thinks that Effexor may be causing more dreams or that her stress may be causing more dreams.  Patient states that she has no problems going to sleep at night but staying asleep is a challenge because of the nightmares.  Patient states that she was in psychiatry for nightmares but that has not helped.    Patient currently takes melatonin and Valorin root to help with sleep.  Patient states that it is helpful but they do not keep her sleep.    Patient has no other concerns.  Review of Systems  Musculoskeletal:  Positive for back pain.  All other systems reviewed and are negative.     Objective:   Physical Exam Vitals reviewed.  Constitutional:      General: She is not in acute distress.    Appearance: Normal appearance. She is obese. She is not ill-appearing or toxic-appearing.  HENT:     Head: Normocephalic and atraumatic.  Cardiovascular:     Rate and Rhythm: Normal rate and regular rhythm.     Pulses: Normal  pulses.     Heart sounds: Normal heart sounds.  Pulmonary:     Effort: Pulmonary effort is normal.     Breath sounds: Normal breath sounds.  Musculoskeletal:     Cervical back: Normal, normal range of motion and neck supple.     Thoracic back: Normal.     Lumbar back: Tenderness present. No swelling, edema, deformity, signs of trauma, lacerations, spasms or bony tenderness. Normal range of motion. Positive left straight leg raise test. Negative right straight leg raise test. No scoliosis.     Comments: Point tenderness to lower left lumbar.  Positive left leg and straight leg test.  Range of motion intact however pain with motion.  Muscle tension also noted to left lower lumbar.  Skin:    General: Skin is warm.     Capillary Refill: Capillary refill takes less than 2 seconds.  Neurological:     Mental Status: She is alert.     Comments: Grossly intact  Psychiatric:        Mood and Affect: Mood normal.        Behavior: Behavior normal.          Assessment & Plan:   1. Acute left-sided low back pain, unspecified whether sciatica present -Patient to trial naproxen. -Patient states that she has been sensitive to NSAIDs in the past but she is  willing to trial naproxen for approximately 15 days. -Take naproxen with food - naproxen (NAPROSYN) 500 MG tablet; Take 1 tablet (500 mg total) by mouth 2 (two) times daily with a meal for 15 days.  Dispense: 30 tablet; Refill: 0 - Ambulatory referral to Physical Therapy -May continue taking baclofen as needed -If physical therapy not helpful will consider referral to Ortho -Return to clinic in 2 weeks for follow-up  2. Morbid obesity ([place]) - CMP14+EGFR - Lipid panel  3. Anemia, unspecified type - CBC with Differential/Platelet - Fe+TIBC+Fer  4. Sleep disturbance -We will trial patient on prazosin to see if it helps with her sleep disturbances and nightmares -Patient to take 1 mg for 2 to 3 days.  If she does not feel any effect from  the 1 mg she may increase to 2 mg after 2 to 3 days. - prazosin (MINIPRESS) 1 MG capsule; Take 1 capsule (1 mg total) by mouth at bedtime.  Dispense: 45 capsule; Refill: 0 -Referral to psychiatry -Return to clinic in 2 weeks    Note:  This document was prepared using Dragon voice recognition software and may include unintentional dictation errors. Note - This record has been created using [employer].  Chart creation errors have been sought, but may not always  have been located. Such creation errors do not reflect on  the standard of medical care.

## 2021-11-10 ENCOUNTER — Other Ambulatory Visit: Payer: Self-pay | Admitting: Nurse Practitioner

## 2021-11-10 DIAGNOSIS — D509 Iron deficiency anemia, unspecified: Secondary | ICD-10-CM

## 2021-11-10 LAB — CBC WITH DIFFERENTIAL/PLATELET
Basophils Absolute: 0 10*3/uL (ref 0.0–0.2)
Basos: 1 %
EOS (ABSOLUTE): 0.1 10*3/uL (ref 0.0–0.4)
Eos: 1 %
Hematocrit: 36 % (ref 34.0–46.6)
Hemoglobin: 11.6 g/dL (ref 11.1–15.9)
Immature Grans (Abs): 0 10*3/uL (ref 0.0–0.1)
Immature Granulocytes: 0 %
Lymphocytes Absolute: 2.3 10*3/uL (ref 0.7–3.1)
Lymphs: 28 %
MCH: 22.8 pg — ABNORMAL LOW (ref 26.6–33.0)
MCHC: 32.2 g/dL (ref 31.5–35.7)
MCV: 71 fL — ABNORMAL LOW (ref 79–97)
Monocytes Absolute: 0.4 10*3/uL (ref 0.1–0.9)
Monocytes: 5 %
Neutrophils Absolute: 5.4 10*3/uL (ref 1.4–7.0)
Neutrophils: 65 %
Platelets: 311 10*3/uL (ref 150–450)
RBC: 5.08 x10E6/uL (ref 3.77–5.28)
RDW: 16.1 % — ABNORMAL HIGH (ref 11.7–15.4)
WBC: 8.3 10*3/uL (ref 3.4–10.8)

## 2021-11-10 LAB — LIPID PANEL
Chol/HDL Ratio: 3.9 ratio (ref 0.0–4.4)
Cholesterol, Total: 147 mg/dL (ref 100–199)
HDL: 38 mg/dL — ABNORMAL LOW (ref >39)
LDL Chol Calc (NIH): 91 mg/dL (ref 0–99)
Triglycerides: 97 mg/dL (ref 0–149)
VLDL Cholesterol Cal: 18 mg/dL (ref 5–40)

## 2021-11-10 LAB — CMP14+EGFR
ALT: 14 IU/L (ref 0–32)
AST: 10 IU/L (ref 0–40)
Albumin/Globulin Ratio: 1.3 (ref 1.2–2.2)
Albumin: 4.3 g/dL (ref 3.9–5.0)
Alkaline Phosphatase: 122 IU/L — ABNORMAL HIGH (ref 44–121)
BUN/Creatinine Ratio: 11 (ref 9–23)
BUN: 7 mg/dL (ref 6–20)
Bilirubin Total: 0.2 mg/dL (ref 0.0–1.2)
CO2: 24 mmol/L (ref 20–29)
Calcium: 9 mg/dL (ref 8.7–10.2)
Chloride: 103 mmol/L (ref 96–106)
Creatinine, Ser: 0.61 mg/dL (ref 0.57–1.00)
Globulin, Total: 3.4 g/dL (ref 1.5–4.5)
Glucose: 92 mg/dL (ref 70–99)
Potassium: 4.8 mmol/L (ref 3.5–5.2)
Sodium: 141 mmol/L (ref 134–144)
Total Protein: 7.7 g/dL (ref 6.0–8.5)
eGFR: 125 mL/min/{1.73_m2} (ref >59)

## 2021-11-10 LAB — IRON,TIBC AND FERRITIN PANEL
Ferritin: 56 ng/mL (ref 15–150)
Iron Saturation: 6 % — CL (ref 15–55)
Iron: 21 ug/dL — ABNORMAL LOW (ref 27–159)
Total Iron Binding Capacity: 328 ug/dL (ref 250–450)
UIBC: 307 ug/dL (ref 131–425)

## 2021-11-10 MED ORDER — IRON (FERROUS SULFATE) 325 (65 FE) MG PO TABS: 325.0000 mg | ORAL_TABLET | Freq: Every day | ORAL | 0 refills | Status: DC

## 2021-11-11 ENCOUNTER — Encounter: Payer: Self-pay | Admitting: Nurse Practitioner

## 2021-11-13 NOTE — Progress Notes (Signed)
Patient informed of md message and recommendations. Verbalized understanding. 

## 2021-11-14 ENCOUNTER — Encounter: Payer: Self-pay | Admitting: Nurse Practitioner

## 2021-11-14 ENCOUNTER — Ambulatory Visit: Payer: Medicaid Other | Admitting: Nurse Practitioner

## 2021-11-14 VITALS — BP 125/84 | HR 87 | Temp 97.9°F | Wt 339.6 lb

## 2021-11-14 DIAGNOSIS — G479 Sleep disorder, unspecified: Secondary | ICD-10-CM | POA: Diagnosis not present

## 2021-11-14 MED ORDER — GABAPENTIN 100 MG PO CAPS: 100.0000 mg | ORAL_CAPSULE | Freq: Every day | ORAL | 0 refills | Status: DC

## 2021-11-14 NOTE — Progress Notes (Unsigned)
   Subjective:    Patient ID: Latoya Mora, female    DOB: 10/06/1995, 26 y.o.   MRN: [id number]  HPI Pt here due to reaction with Prazosin 1 mg. Pt sent my chart message with what happened: I took prazosin for the first time last night and sleep through the night without waking or having nightmares. I was quite hopeful. However I took one this evening and noted before bed my heart was beating faster than usual and went to sleep. I've just woken up and right after waking my heart rate was almost 115. I didn't have a nightmare, I just woke up this way. My chest feels slightly heavy and I feel a bit faint. I've waited about 20 minutes of laying down and my heart rate seems to have gone down to 110ish. I'd like to discuss this with you as soon as possible. In the mean time I will no longer be taking the Prazosin.  Happened on 11/10/21. Pt has not taken any more and is still having nightmares. First night on med did great.    Review of Systems     Objective:   Physical Exam        Assessment & Plan:

## 2021-11-15 ENCOUNTER — Encounter: Payer: Self-pay | Admitting: Nurse Practitioner

## 2021-11-24 ENCOUNTER — Other Ambulatory Visit: Payer: Self-pay | Admitting: Family Medicine

## 2021-11-24 ENCOUNTER — Telehealth: Payer: Self-pay

## 2021-11-24 MED ORDER — BACLOFEN 10 MG PO TABS: 10.0000 mg | ORAL_TABLET | Freq: Three times a day (TID) | ORAL | 1 refills | Status: DC | PRN

## 2021-11-24 NOTE — Telephone Encounter (Signed)
Encourage patient to contact the pharmacy for refills or they can request refills through [facility]  (Please schedule appointment if patient has not been seen in over a year)    [place] TO: [place], [address]  [address], [place] [address]   MEDICATION NAME & DOSE:baclofen (LIORESAL) 10 MG tablet   NOTES/COMMENTS FROM PATIENT:      [place] office please notify patient: It takes 48-72 hours to process rx refill requests Ask patient to call pharmacy to ensure rx is ready before heading there.

## 2021-12-04 ENCOUNTER — Ambulatory Visit: Payer: Self-pay | Admitting: Nurse Practitioner

## 2021-12-12 ENCOUNTER — Encounter: Payer: Self-pay | Admitting: Nurse Practitioner

## 2021-12-12 ENCOUNTER — Other Ambulatory Visit: Payer: Self-pay | Admitting: Nurse Practitioner

## 2021-12-12 DIAGNOSIS — D509 Iron deficiency anemia, unspecified: Secondary | ICD-10-CM

## 2021-12-13 ENCOUNTER — Ambulatory Visit (HOSPITAL_COMMUNITY): Payer: Medicaid Other | Attending: Nurse Practitioner | Admitting: Physical Therapy

## 2021-12-29 ENCOUNTER — Telehealth: Payer: Medicaid Other | Admitting: Nurse Practitioner

## 2021-12-29 DIAGNOSIS — R399 Unspecified symptoms and signs involving the genitourinary system: Secondary | ICD-10-CM | POA: Diagnosis not present

## 2021-12-30 MED ORDER — NITROFURANTOIN MONOHYD MACRO 100 MG PO CAPS: 100.0000 mg | ORAL_CAPSULE | Freq: Two times a day (BID) | ORAL | 0 refills | Status: DC

## 2021-12-30 NOTE — Progress Notes (Signed)
E-Visit for Urinary Problems  We are sorry that you are not feeling well.  Here is how we plan to help!  Based on what you shared with me it looks like you most likely have a simple urinary tract infection.  A UTI (Urinary Tract Infection) is a bacterial infection of the bladder.  Most cases of urinary tract infections are simple to treat but a key part of your care is to encourage you to drink plenty of fluids and watch your symptoms carefully.  I have prescribed MacroBid 100 mg twice a day for 5 days.  Your symptoms should gradually improve. Call us if the burning in your urine worsens, you develop worsening fever, back pain or pelvic pain or if your symptoms do not resolve after completing the antibiotic.  Urinary tract infections can be prevented by drinking plenty of water to keep your body hydrated.  Also be sure when you wipe, wipe from front to back and don't hold it in!  If possible, empty your bladder every 4 hours.  HOME CARE Drink plenty of fluids Compete the full course of the antibiotics even if the symptoms resolve Remember, when you need to go.go. Holding in your urine can increase the likelihood of getting a UTI! GET HELP RIGHT AWAY IF: You cannot urinate You get a high fever Worsening back pain occurs You see blood in your urine You feel sick to your stomach or throw up You feel like you are going to pass out  MAKE SURE YOU  Understand these instructions. Will watch your condition. Will get help right away if you are not doing well or get worse.   Thank you for choosing an e-visit.  Your e-visit answers were reviewed by a board certified advanced clinical practitioner to complete your personal care plan. Depending upon the condition, your plan could have included both over the counter or prescription medications.  Please review your pharmacy choice. Make sure the pharmacy is open so you can pick up prescription now. If there is a problem, you may contact your  provider through [employer] and have the prescription routed to another pharmacy.  Your safety is important to [place]. If you have drug allergies check your prescription carefully.   For the next 24 hours you can use MyChart to ask questions about today's visit, request a non-urgent call back, or ask for a work or school excuse. You will get an email in the next two days asking about your experience. I hope that your e-visit has been valuable and will speed your recovery.  5-10 minutes spent reviewing and documenting in chart.

## 2022-01-02 ENCOUNTER — Ambulatory Visit: Payer: Medicaid Other | Admitting: Obstetrics & Gynecology

## 2022-01-17 ENCOUNTER — Other Ambulatory Visit: Payer: Self-pay | Admitting: Podiatry

## 2022-01-22 ENCOUNTER — Other Ambulatory Visit: Payer: Self-pay | Admitting: Podiatry

## 2022-01-22 ENCOUNTER — Telehealth: Payer: Self-pay | Admitting: *Deleted

## 2022-01-22 DIAGNOSIS — Z79899 Other long term (current) drug therapy: Secondary | ICD-10-CM | POA: Diagnosis not present

## 2022-01-22 NOTE — Telephone Encounter (Signed)
Patient will come in today to pick up hepatic liver function labs, once they come back ok, will prescribe another round of the terbinafine- 250 mg, per physician. Patient has been notified and will pick up lab paperwork today from front desk.

## 2022-01-22 NOTE — Progress Notes (Signed)
Updated LFT order. If WNL we will represcribe Lamisil '250mg'$  #90 daily.   Edrick Kins, DPM [facility]  Dr. Edrick Kins, DPM    [address], [place] [address]                Office [phone]  Fax [phone]

## 2022-01-23 ENCOUNTER — Encounter: Payer: Self-pay | Admitting: Podiatry

## 2022-01-23 LAB — HEPATIC FUNCTION PANEL
ALT: 15 IU/L (ref 0–32)
AST: 10 IU/L (ref 0–40)
Albumin: 4.3 g/dL (ref 4.0–5.0)
Alkaline Phosphatase: 126 IU/L — ABNORMAL HIGH (ref 44–121)
Bilirubin Total: 0.2 mg/dL (ref 0.0–1.2)
Bilirubin, Direct: <0.1 mg/dL (ref 0.00–0.40)
Total Protein: 7.6 g/dL (ref 6.0–8.5)

## 2022-01-25 ENCOUNTER — Telehealth: Payer: Self-pay

## 2022-01-25 NOTE — Telephone Encounter (Signed)
No further notes are needed at this time.

## 2022-01-29 ENCOUNTER — Other Ambulatory Visit: Payer: Self-pay | Admitting: Podiatry

## 2022-01-29 ENCOUNTER — Telehealth: Payer: Self-pay

## 2022-01-29 NOTE — Telephone Encounter (Signed)
Spoke with patient.  Reviewed the results.  She will return to clinic as needed.  Thanks much, Dr. Amalia Hailey

## 2022-01-29 NOTE — Progress Notes (Signed)
ProximalSpoke with the patient on the phone today.  Her toenail is growing out nicely.  She says the half of the nail plate is healthy and pink and viable.  She has noticed a tremendous improvement in in the appearance of her toenail.   Patient had an updated LFT on 01/22/2022 which demonstrated slightly elevated AP.   For now we are not going to refill the prescription for the Lamisil. She may continue the Tolcylen antifungal however. Continue to apply daily.  Explained to the patient that it may take another 4 months for the nail to completely grow out but so far I am happy with the results of what the patient reports.  Return to clinic or contact the office as needed.  Edrick Kins, DPM [facility]  Dr. Edrick Kins, DPM    [address], [place] [address]                Office [phone]  Fax [phone]

## 2022-01-31 NOTE — Telephone Encounter (Signed)
Noted, thanks!

## 2022-03-25 ENCOUNTER — Encounter: Payer: Self-pay | Admitting: Podiatry

## 2022-03-30 ENCOUNTER — Telehealth: Payer: Medicaid Other | Admitting: Physician Assistant

## 2022-03-30 DIAGNOSIS — J208 Acute bronchitis due to other specified organisms: Secondary | ICD-10-CM | POA: Diagnosis not present

## 2022-03-30 MED ORDER — BENZONATATE 100 MG PO CAPS: 100.0000 mg | ORAL_CAPSULE | Freq: Three times a day (TID) | ORAL | 0 refills | Status: DC | PRN

## 2022-03-30 MED ORDER — PREDNISONE 10 MG (21) PO TBPK: ORAL_TABLET | ORAL | 0 refills | Status: DC

## 2022-03-30 NOTE — Progress Notes (Signed)
We are sorry that you are not feeling well.  Here is how we plan to help!  Based on your presentation I believe you most likely have A cough due to a virus.  This is called viral bronchitis and is best treated by rest, plenty of fluids and control of the cough.  You may use Ibuprofen or Tylenol as directed to help your symptoms.     In addition you may use A non-prescription cough medication called Mucinex DM: take 2 tablets every 12 hours. and A prescription cough medication called Tessalon Perles 161m. You may take 1-2 capsules every 8 hours as needed for your cough.  Prednisone 10 mg daily for 6 days (see taper instructions below)  Directions for 6 day taper: Day 1: 2 tablets before breakfast, 1 after both lunch & dinner and 2 at bedtime Day 2: 1 tab before breakfast, 1 after both lunch & dinner and 2 at bedtime Day 3: 1 tab at each meal & 1 at bedtime Day 4: 1 tab at breakfast, 1 at lunch, 1 at bedtime Day 5: 1 tab at breakfast & 1 tab at bedtime Day 6: 1 tab at breakfast  From your responses in the eVisit questionnaire you describe inflammation in the upper respiratory tract which is causing a significant cough.  This is commonly called Bronchitis and has four common causes:   Allergies Viral Infections Acid Reflux Bacterial Infection Allergies, viruses and acid reflux are treated by controlling symptoms or eliminating the cause. An example might be a cough caused by taking certain blood pressure medications. You stop the cough by changing the medication. Another example might be a cough caused by acid reflux. Controlling the reflux helps control the cough.  USE OF BRONCHODILATOR ("RESCUE") INHALERS: There is a risk from using your bronchodilator too frequently.  The risk is that over-reliance on a medication which only relaxes the muscles surrounding the breathing tubes can reduce the effectiveness of medications prescribed to reduce swelling and congestion of the tubes themselves.   Although you feel brief relief from the bronchodilator inhaler, your asthma may actually be worsening with the tubes becoming more swollen and filled with mucus.  This can delay other crucial treatments, such as oral steroid medications. If you need to use a bronchodilator inhaler daily, several times per day, you should discuss this with your provider.  There are probably better treatments that could be used to keep your asthma under control.     HOME CARE Only take medications as instructed by your medical team. Complete the entire course of an antibiotic. Drink plenty of fluids and get plenty of rest. Avoid close contacts especially the very young and the elderly Cover your mouth if you cough or cough into your sleeve. Always remember to wash your hands A steam or ultrasonic humidifier can help congestion.   GET HELP RIGHT AWAY IF: You develop worsening fever. You become short of breath You cough up blood. Your symptoms persist after you have completed your treatment plan MAKE SURE YOU  Understand these instructions. Will watch your condition. Will get help right away if you are not doing well or get worse.    Thank you for choosing an e-visit.  Your e-visit answers were reviewed by a board certified advanced clinical practitioner to complete your personal care plan. Depending upon the condition, your plan could have included both over the counter or prescription medications.  Please review your pharmacy choice. Make sure the pharmacy is open so you can  pick up prescription now. If there is a problem, you may contact your provider through MyChart messaging and have the prescription routed to another pharmacy.  Your safety is important to us. If you have drug allergies check your prescription carefully.   For the next 24 hours you can use MyChart to ask questions about today's visit, request a non-urgent call back, or ask for a work or school excuse. You will get an email in the next  two days asking about your experience. I hope that your e-visit has been valuable and will speed your recovery.  I have spent 5 minutes in review of e-visit questionnaire, review and updating patient chart, medical decision making and response to patient.   Francis Yardley M Yekaterina Escutia, PA-C  

## 2022-04-06 ENCOUNTER — Telehealth: Payer: Self-pay | Admitting: *Deleted

## 2022-04-10 ENCOUNTER — Other Ambulatory Visit: Payer: Self-pay | Admitting: Podiatry

## 2022-04-10 MED ORDER — TERBINAFINE HCL 250 MG PO TABS
250.0000 mg | ORAL_TABLET | Freq: Every day | ORAL | 0 refills | Status: DC
Start: 2022-04-10 — End: 2022-11-02

## 2022-04-10 NOTE — Telephone Encounter (Signed)
Patient is calling to ask for another round of the Lamisil, nails are looking worse. No growth progress at all,instead of growing outward, seems that the nail is growing down into cuticle.the medication prescribed(Tolcylen) is not helping.Please advise

## 2022-04-10 NOTE — Telephone Encounter (Signed)
Called patient,no answer, left voice that medication was sent to pharmacy.

## 2022-04-10 NOTE — Telephone Encounter (Signed)
Sent in a refill of Lamisil. Please notify patient. - Dr. Amalia Hailey

## 2022-04-20 ENCOUNTER — Ambulatory Visit: Payer: Medicaid Other | Admitting: Podiatry

## 2022-06-04 ENCOUNTER — Other Ambulatory Visit: Payer: Self-pay

## 2022-06-04 ENCOUNTER — Emergency Department (HOSPITAL_COMMUNITY)
Admission: EM | Admit: 2022-06-04 | Discharge: 2022-06-04 | Disposition: A | Discharge status: Home / Self Care | Payer: Medicaid Other | Attending: Emergency Medicine | Admitting: Emergency Medicine

## 2022-06-04 ENCOUNTER — Emergency Department (HOSPITAL_COMMUNITY): Payer: Medicaid Other

## 2022-06-04 ENCOUNTER — Encounter (HOSPITAL_COMMUNITY): Payer: Self-pay

## 2022-06-04 DIAGNOSIS — R109 Unspecified abdominal pain: Secondary | ICD-10-CM | POA: Diagnosis present

## 2022-06-04 DIAGNOSIS — R1032 Left lower quadrant pain: Secondary | ICD-10-CM | POA: Insufficient documentation

## 2022-06-04 LAB — COMPREHENSIVE METABOLIC PANEL
ALT: 20 U/L (ref 0–44)
AST: 18 U/L (ref 15–41)
Albumin: 3.7 g/dL (ref 3.5–5.0)
Alkaline Phosphatase: 106 U/L (ref 38–126)
Anion gap: 5 (ref 5–15)
BUN: 9 mg/dL (ref 6–20)
CO2: 26 mmol/L (ref 22–32)
Calcium: 8.4 mg/dL — ABNORMAL LOW (ref 8.9–10.3)
Chloride: 105 mmol/L (ref 98–111)
Creatinine, Ser: 0.58 mg/dL (ref 0.44–1.00)
GFR, Estimated: >60 mL/min (ref >60)
Glucose, Bld: 97 mg/dL (ref 70–99)
Potassium: 3.9 mmol/L (ref 3.5–5.1)
Sodium: 136 mmol/L (ref 135–145)
Total Bilirubin: 0.2 mg/dL — ABNORMAL LOW (ref 0.3–1.2)
Total Protein: 8.2 g/dL — ABNORMAL HIGH (ref 6.5–8.1)

## 2022-06-04 LAB — URINALYSIS, ROUTINE W REFLEX MICROSCOPIC
Bilirubin Urine: NEGATIVE
Glucose, UA: NEGATIVE mg/dL
Hgb urine dipstick: NEGATIVE
Ketones, ur: NEGATIVE mg/dL
Leukocytes,Ua: NEGATIVE
Nitrite: NEGATIVE
Protein, ur: NEGATIVE mg/dL
Specific Gravity, Urine: 1.012 (ref 1.005–1.030)
pH: 7 (ref 5.0–8.0)

## 2022-06-04 LAB — CBC
HCT: 38.3 % (ref 36.0–46.0)
Hemoglobin: 11.2 g/dL — ABNORMAL LOW (ref 12.0–15.0)
MCH: 21.9 pg — ABNORMAL LOW (ref 26.0–34.0)
MCHC: 29.2 g/dL — ABNORMAL LOW (ref 30.0–36.0)
MCV: 74.8 fL — ABNORMAL LOW (ref 80.0–100.0)
Platelets: 265 10*3/uL (ref 150–400)
RBC: 5.12 MIL/uL — ABNORMAL HIGH (ref 3.87–5.11)
RDW: 17 % — ABNORMAL HIGH (ref 11.5–15.5)
WBC: 7.6 10*3/uL (ref 4.0–10.5)
nRBC: 0 % (ref 0.0–0.2)

## 2022-06-04 LAB — LIPASE, BLOOD: Lipase: 31 U/L (ref 11–51)

## 2022-06-04 LAB — POC URINE PREG, ED: Preg Test, Ur: NEGATIVE

## 2022-06-04 MED ORDER — IOHEXOL 300 MG/ML  SOLN
100.0000 mL | Freq: Once | INTRAMUSCULAR | Status: AC | PRN
  Administered 2022-06-04: 100 mL via INTRAVENOUS

## 2022-06-04 NOTE — ED Provider Notes (Signed)
[facility] EMERGENCY DEPARTMENT Provider Note   CSN: [id number] Arrival date & time: 06/04/22  1040     History  Chief Complaint  Patient presents with   Flank Pain    Latoya Mora is a 26 y.o. female.  Patient presents the emergency department complaining of progressively worsening left-sided flank pain.  Patient states pain began approximate 1 week ago.  She describes it as feeling as there is a large gas bubble.  She states the pain is worse if her bladder or bowel is full.  She also reports mild nausea.  She denies vomiting, diarrhea, blood in stool, dysuria, hematuria.  The patient does state that she has had 4 episodes of vaginal bleeding over the past 2 months as opposed to her normal cycle.  She denies heaviness of bleeding, vaginal pain.  Past medical history significant for PCOS, morbid obesity, anxiety depression, left-sided low back pain, anemia, elevated liver enzymes  HPI     Home Medications Prior to Admission medications   Medication Sig Start Date End Date Taking? Authorizing Provider  acetaminophen (TYLENOL) 500 MG tablet Take 1,000 mg by mouth every 6 (six) hours as needed for moderate pain or headache.    [provider]  baclofen (LIORESAL) 10 MG tablet Take 1 tablet (10 mg total) by mouth 3 (three) times daily as needed for muscle spasms. 11/24/21   Coral Spikes, DO  benzonatate (TESSALON) 100 MG capsule Take 1 capsule (100 mg total) by mouth 3 (three) times daily as needed. 03/30/22   Mar Daring, PA-C  buPROPion (WELLBUTRIN XL) 150 MG 24 hr tablet Take 1 tablet (150 mg total) by mouth daily. 10/31/21   Anyanwu, Sallyanne Havers, MD  doxylamine, Sleep, (UNISOM) 25 MG tablet Take 25-50 mg by mouth at bedtime as needed for sleep.    [provider]  gabapentin (NEURONTIN) 100 MG capsule Take 1 capsule (100 mg total) by mouth daily. 11/14/21   Ameduite, Trenton Gammon, FNP  Iron, Ferrous Sulfate, 325 (65 Fe) MG TABS Take 325 mg by mouth daily. 11/10/21  12/10/21  Ameduite, Trenton Gammon, FNP  Melatonin 3 MG CAPS Take 3 mg by mouth at bedtime as needed (sleep).     [provider]  nitrofurantoin, macrocrystal-monohydrate, (MACROBID) 100 MG capsule Take 1 capsule (100 mg total) by mouth 2 (two) times daily. 1 po BId 12/30/21   Hassell Done, Mary-Margaret, FNP  ondansetron (ZOFRAN) 4 MG tablet Take 1 tablet (4 mg total) by mouth every 8 (eight) hours as needed for nausea or vomiting. 06/02/21   Domenic Moras, PA-C  predniSONE (STERAPRED UNI-PAK 21 TAB) 10 MG (21) TBPK tablet 6 day taper; take as directed on package instructions 03/30/22   Mar Daring, PA-C  terbinafine (LAMISIL) 250 MG tablet Take 1 tablet (250 mg total) by mouth daily. 04/10/22   Edrick Kins, DPM  venlafaxine XR (EFFEXOR-XR) 150 MG 24 hr capsule Take 1 capsule (150 mg total) by mouth daily with breakfast. 10/31/21   Anyanwu, Sallyanne Havers, MD      Allergies    Etodolac, Meloxicam, Flexeril [cyclobenzaprine], Nsaids, Prozac [fluoxetine hcl], and Hydroxyzine    Review of Systems   Review of Systems  Respiratory:  Negative for shortness of breath.   Cardiovascular:  Negative for chest pain.  Gastrointestinal:  Positive for abdominal pain and nausea. Negative for anal bleeding, blood in stool, diarrhea and vomiting.  Genitourinary:  Positive for flank pain and vaginal bleeding. Negative for dysuria, hematuria, vaginal discharge and  vaginal pain.    Physical Exam Updated Vital Signs BP 122/69   Pulse 90   Temp 98.3 F (36.8 C) (Oral)   Resp 20   Ht '5\' 8"'$  (1.727 m)   Wt (!) 152 kg   SpO2 100%   BMI 50.94 kg/m  Physical Exam Vitals and nursing note reviewed.  Constitutional:      General: She is not in acute distress.    Appearance: She is well-developed. She is obese.  HENT:     Head: Normocephalic and atraumatic.  Eyes:     Conjunctiva/sclera: Conjunctivae normal.  Cardiovascular:     Rate and Rhythm: Normal rate and regular rhythm.     Heart sounds: No murmur  heard. Pulmonary:     Effort: Pulmonary effort is normal. No respiratory distress.     Breath sounds: Normal breath sounds.  Abdominal:     Palpations: Abdomen is soft.     Tenderness: There is abdominal tenderness (Left lower quadrant). There is left CVA tenderness (Mild).  Musculoskeletal:        General: No swelling.     Cervical back: Neck supple.  Skin:    General: Skin is warm and dry.     Capillary Refill: Capillary refill takes less than 2 seconds.  Neurological:     Mental Status: She is alert.  Psychiatric:        Mood and Affect: Mood normal.     ED Results / Procedures / Treatments   Labs (all labs ordered are listed, but only abnormal results are displayed) Labs Reviewed  COMPREHENSIVE METABOLIC PANEL - Abnormal; Notable for the following components:      Result Value   Calcium 8.4 (*)    Total Protein 8.2 (*)    Total Bilirubin 0.2 (*)    All other components within normal limits  CBC - Abnormal; Notable for the following components:   RBC 5.12 (*)    Hemoglobin 11.2 (*)    MCV 74.8 (*)    MCH 21.9 (*)    MCHC 29.2 (*)    RDW 17.0 (*)    All other components within normal limits  LIPASE, BLOOD  URINALYSIS, ROUTINE W REFLEX MICROSCOPIC  POC URINE PREG, ED    EKG None  Radiology CT Abdomen Pelvis W Contrast  Result Date: 06/04/2022 CLINICAL DATA:  Abdominal pain, acute, nonlocalized EXAM: CT ABDOMEN AND PELVIS WITH CONTRAST TECHNIQUE: Multidetector CT imaging of the abdomen and pelvis was performed using the standard protocol following bolus administration of intravenous contrast. RADIATION DOSE REDUCTION: This exam was performed according to the departmental dose-optimization program which includes automated exposure control, adjustment of the mA and/or kV according to patient size and/or use of iterative reconstruction technique. CONTRAST:  116m OMNIPAQUE IOHEXOL 300 MG/ML  SOLN COMPARISON:  Pelvic ultrasound 09/29/2021, CT abdomen pelvis 03/02/2020.  FINDINGS: Lower chest: No acute abnormality. Hepatobiliary: No focal liver abnormality is seen. The gallbladder is unremarkable. Pancreas: Unremarkable. No pancreatic ductal dilatation or surrounding inflammatory changes. Spleen: Unremarkable. Adrenals/Urinary Tract: Adrenal glands are unremarkable. No hydronephrosis or nephroureterolithiasis. Bladder is unremarkable. Stomach/Bowel: The stomach is within normal limits. There is no evidence of bowel obstruction.The appendix is normal. Vascular/Lymphatic: No significant vascular findings are present. No enlarged abdominal or pelvic lymph nodes. Reproductive: Uterus is unremarkable for age. Prior left-sided oophorectomy. Right ovary is unremarkable. Other: Mild diastasis recti. Tiny fat containing umbilical hernia. No bowel containing hernia. No ascites. No free air. Musculoskeletal: No acute osseous abnormality. No suspicious osseous lesion.  IMPRESSION: No acute findings in the abdomen or pelvis. Electronically Signed   By: Maurine Simmering M.D.   On: 06/04/2022 15:24    Procedures Procedures    Medications Ordered in ED Medications  iohexol (OMNIPAQUE) 300 MG/ML solution 100 mL (100 mLs Intravenous Contrast Given 06/04/22 1500)    ED Course/ Medical Decision Making/ A&P                           Medical Decision Making Amount and/or Complexity of Data Reviewed Labs: ordered. Radiology: ordered.  Risk Prescription drug management.   This patient presents to the ED for concern of abdominal pain, this involves an extensive number of treatment options, and is a complaint that carries with it a high risk of complications and morbidity.  The differential diagnosis includes diverticulitis, pyelonephritis, nephrolithiasis, gastritis, and others   Co morbidities that complicate the patient evaluation  PCOS, anxiety depression, obesity   Lab Tests:  I Ordered, and personally interpreted labs.  The pertinent results include: Grossly unremarkable CMP,  CBC, lipase, urinalysis.  Negative urine pregnancy test.  Imaging Studies ordered:  I ordered imaging studies including CT abdomen pelvis with contrast I independently visualized and interpreted imaging which showed No acute findings in the abdomen or pelvis.  I agree with the radiologist interpretation   Test / Admission - Considered:  No appendicitis, cholecystitis, diverticulitis, pyelonephritis, nephrolithiasis on CT scan.  Urinalysis was negative for abnormalities.  Unclear etiology at this time of patient's pain.  Vital signs are normal.  I see no emergent causes that would require hospitalization at this time.  Plan to discharge patient home with recommendations for follow-up with primary care provider.        Final Clinical Impression(s) / ED Diagnoses Final diagnoses:  Left lower quadrant abdominal pain    Rx / DC Orders ED Discharge Orders     None         Ronny Bacon 06/04/22 1638    Milton Ferguson, MD 06/06/22 [phone]

## 2022-06-04 NOTE — Discharge Instructions (Signed)
You were seen today for abdominal pain. Your workup was reassuring for no signs of life threatening conditions. Please follow up as needed with your OB/GYN or primary care provider. If you need help establishing primary care please call the number in your paperwork. If your pain worsens or you develop other life threatening conditions please return to the emergency department

## 2022-06-04 NOTE — ED Triage Notes (Signed)
Pt presents with progressive worsening of L flank pain x 1 week. Pt states the pain is worse if her bladder or bowel is full. Pt reports she has had 4 times over 2 months of vaginal bleeding that was similar to her menstrual cycle. Pt reports mild nausea. Denies vomiting, diarrhea, or dysuria.

## 2022-07-04 ENCOUNTER — Encounter: Payer: Medicaid Other | Admitting: Nurse Practitioner

## 2022-07-04 NOTE — Progress Notes (Signed)
Because you have had recurrent back injuries, I feel your condition warrants further evaluation and I recommend that you be seen for a face to face visit.  Please contact your primary care physician practice to be seen. Many offices offer virtual options to be seen via video if you are not comfortable going in person to a medical facility at this time.  NOTE: You will NOT be charged for this eVisit.  If you do not have a PCP, [facility] offers a free physician referral service available at [phone]. Our trained staff has the experience, knowledge and resources to put you in touch with a physician who is right for you.    If you are having a true medical emergency please call 911.   Your e-visit answers were reviewed by a board certified advanced clinical practitioner to complete your personal care plan.  Thank you for using e-Visits.

## 2022-07-12 ENCOUNTER — Ambulatory Visit: Payer: Medicaid Other | Admitting: Obstetrics and Gynecology

## 2022-07-12 ENCOUNTER — Encounter: Payer: Self-pay | Admitting: Obstetrics and Gynecology

## 2022-07-12 VITALS — BP 151/97 | HR 109 | Wt 342.0 lb

## 2022-07-12 DIAGNOSIS — D509 Iron deficiency anemia, unspecified: Secondary | ICD-10-CM | POA: Diagnosis not present

## 2022-07-12 DIAGNOSIS — N939 Abnormal uterine and vaginal bleeding, unspecified: Secondary | ICD-10-CM | POA: Diagnosis not present

## 2022-07-12 DIAGNOSIS — Z124 Encounter for screening for malignant neoplasm of cervix: Secondary | ICD-10-CM

## 2022-07-12 DIAGNOSIS — Z1329 Encounter for screening for other suspected endocrine disorder: Secondary | ICD-10-CM | POA: Diagnosis not present

## 2022-07-12 MED ORDER — TRANEXAMIC ACID 650 MG PO TABS: 1300.0000 mg | ORAL_TABLET | Freq: Three times a day (TID) | ORAL | 2 refills | Status: DC

## 2022-07-12 NOTE — Progress Notes (Signed)
Obstetrics and Gynecology Visit Return Patient Evaluation  Appointment Date: 07/12/2022  Primary Care Provider: Patient, No Pcp Per  OBGYN Clinic: [facility]  Chief Complaint: AUB  History of Present Illness:  Latoya Mora is a 27 y.o. who states that she had a regular period three weeks ago but instead of ending, the bleeding continued and has been heavy at times, no pain with the AUB. She is on nothing for birth control or period control. Her periods are qmonth, regular and approximately one week.   PMHx significant for BMI 50s, HTN, h/o LSO for dermoid.   Review of Systems: as noted in the History of Present Illness.  Medications:  Latoya Mora had no medications administered during this visit. Current Outpatient Medications  Medication Sig Dispense Refill   acetaminophen (TYLENOL) 500 MG tablet Take 1,000 mg by mouth every 6 (six) hours as needed for moderate pain or headache.     baclofen (LIORESAL) 10 MG tablet Take 1 tablet (10 mg total) by mouth 3 (three) times daily as needed for muscle spasms. 30 tablet 1   buPROPion (WELLBUTRIN XL) 150 MG 24 hr tablet Take 1 tablet (150 mg total) by mouth daily. 90 tablet 3   doxylamine, Sleep, (UNISOM) 25 MG tablet Take 25-50 mg by mouth at bedtime as needed for sleep.     terbinafine (LAMISIL) 250 MG tablet Take 1 tablet (250 mg total) by mouth daily. 90 tablet 0   venlafaxine XR (EFFEXOR-XR) 150 MG 24 hr capsule Take 1 capsule (150 mg total) by mouth daily with breakfast. 90 capsule 3   No current facility-administered medications for this visit.    Allergies: is allergic to etodolac, meloxicam, flexeril [cyclobenzaprine], nsaids, prozac [fluoxetine hcl], and hydroxyzine.  Physical Exam:  BP (!) 151/97   Pulse (!) 109   Wt (!) 342 lb (155.1 kg)   BMI 52.00 kg/m  Body mass index is 52 kg/m. General appearance: Well nourished, well developed female in no acute distress.  Abdomen: obese,  nttp Neuro/Psych:  Normal mood and affect.    Pelvic exam:  EGBUS: normal Vaginal vault: approximatley 5-54m of old blood in the vault Cervix:  nttp, normal appearing. Small clots coming out of the os and pap deferred due to this Bimanual: negative   Assessment: patient stable  Plan:  1. Abnormal uterine bleeding (AUB) Lystead sent in and will touch base with her next week. If bleeding improved at that time, can d/w her re: any maintenance therapy, and/or an ultrasound, vs expectant management.  - CBC - TSH Rfx on Abnormal to Free T4 - Beta hCG quant (ref lab)  2. Cervical cancer screening See above. Needs pap at some point in the future.    RTC: 1wk  CDurene RomansMD Attending [facility] [employer]

## 2022-07-12 NOTE — Progress Notes (Signed)
RGYN patient here for irregular bleeding has been bleeding x 3 wks now unsure of start date.   Bleeding is extremely heavy, bright red, clots a dozen tampons in 24 hours, notes ruining clothes and voices stress of this heavy bleeding.   Pt notes fatigue.   Contraception : None   *Pt NOT ABLE TO VOID AT THIS TIME WENT BEFORE VISIT.

## 2022-07-13 ENCOUNTER — Ambulatory Visit: Payer: Medicaid Other

## 2022-07-13 LAB — TSH RFX ON ABNORMAL TO FREE T4: TSH: 2.23 u[IU]/mL (ref 0.450–4.500)

## 2022-07-13 LAB — BETA HCG QUANT (REF LAB): hCG Quant: <1 m[IU]/mL

## 2022-07-13 LAB — CBC
Hematocrit: 33.8 % — ABNORMAL LOW (ref 34.0–46.6)
Hemoglobin: 10.4 g/dL — ABNORMAL LOW (ref 11.1–15.9)
MCH: 21.9 pg — ABNORMAL LOW (ref 26.6–33.0)
MCHC: 30.8 g/dL — ABNORMAL LOW (ref 31.5–35.7)
MCV: 71 fL — ABNORMAL LOW (ref 79–97)
Platelets: 302 10*3/uL (ref 150–450)
RBC: 4.75 x10E6/uL (ref 3.77–5.28)
RDW: 15.8 % — ABNORMAL HIGH (ref 11.7–15.4)
WBC: 7.9 10*3/uL (ref 3.4–10.8)

## 2022-07-14 MED ORDER — IRON (FERROUS SULFATE) 325 (65 FE) MG PO TABS: 325.0000 mg | ORAL_TABLET | ORAL | 0 refills | Status: DC

## 2022-07-14 NOTE — Addendum Note (Signed)
Addended by: Aletha Halim on: 07/14/2022 01:16 AM   Modules accepted: Orders

## 2022-07-16 ENCOUNTER — Encounter: Payer: Self-pay | Admitting: Obstetrics and Gynecology

## 2022-07-18 ENCOUNTER — Encounter: Payer: Self-pay | Admitting: Obstetrics and Gynecology

## 2022-07-18 ENCOUNTER — Other Ambulatory Visit: Payer: Self-pay | Admitting: Obstetrics and Gynecology

## 2022-07-18 MED ORDER — MEGESTROL ACETATE 40 MG PO TABS: 40.0000 mg | ORAL_TABLET | Freq: Two times a day (BID) | ORAL | 0 refills | Status: DC

## 2022-07-23 ENCOUNTER — Ambulatory Visit: Payer: Medicaid Other | Admitting: Obstetrics and Gynecology

## 2022-08-05 ENCOUNTER — Encounter: Payer: Self-pay | Admitting: Obstetrics and Gynecology

## 2022-08-09 ENCOUNTER — Ambulatory Visit: Payer: Medicaid Other | Admitting: Family Medicine

## 2022-10-26 ENCOUNTER — Telehealth: Payer: Medicaid Other | Admitting: Nurse Practitioner

## 2022-10-26 DIAGNOSIS — M545 Low back pain, unspecified: Secondary | ICD-10-CM

## 2022-10-26 MED ORDER — BACLOFEN 10 MG PO TABS: 10.0000 mg | ORAL_TABLET | Freq: Three times a day (TID) | ORAL | 0 refills | Status: DC

## 2022-10-26 NOTE — Progress Notes (Signed)
We are sorry that you are not feeling well.  Here is how we plan to help!  Based on what you have shared with me it looks like you mostly have acute back pain.  Acute back pain is defined as musculoskeletal pain that can resolve in 1-3 weeks with conservative treatment.  I have prescribed  Baclofen 10 mg every eight hours as needed which is a muscle relaxer  Some patients experience stomach irritation or in increased heartburn with anti-inflammatory drugs.  Please keep in mind that muscle relaxer's can cause fatigue and should not be taken while at work or driving.  Back pain is very common.  The pain often gets better over time.  The cause of back pain is usually not dangerous.  Most people can learn to manage their back pain on their own.  Home Care Stay active.  Start with short walks on flat ground if you can.  Try to walk farther each day. Do not sit, drive or stand in one place for more than 30 minutes.  Do not stay in bed. Do not avoid exercise or work.  Activity can help your back heal faster. Be careful when you bend or lift an object.  Bend at your knees, keep the object close to you, and do not twist. Sleep on a firm mattress.  Lie on your side, and bend your knees.  If you lie on your back, put a pillow under your knees. Only take medicines as told by your doctor. Put ice on the injured area. Put ice in a plastic bag Place a towel between your skin and the bag Leave the ice on for 15-20 minutes, 3-4 times a day for the first 2-3 days. 210 After that, you can switch between ice and heat packs. Ask your doctor about back exercises or massage. Avoid feeling anxious or stressed.  Find good ways to deal with stress, such as exercise.  Get Help Right Way If: Your pain does not go away with rest or medicine. Your pain does not go away in 1 week. You have new problems. You do not feel well. The pain spreads into your legs. You cannot control when you poop (bowel movement) or pee  (urinate) You feel sick to your stomach (nauseous) or throw up (vomit) You have belly (abdominal) pain. You feel like you may pass out (faint). If you develop a fever.  Make Sure you: Understand these instructions. Will watch your condition Will get help right away if you are not doing well or get worse.  Your e-visit answers were reviewed by a board certified advanced clinical practitioner to complete your personal care plan.  Depending on the condition, your plan could have included both over the counter or prescription medications.  If there is a problem please reply  once you have received a response from your provider.  Your safety is important to [place].  If you have drug allergies check your prescription carefully.    You can use MyChart to ask questions about today's visit, request a non-urgent call back, or ask for a work or school excuse for 24 hours related to this e-Visit. If it has been greater than 24 hours you will need to follow up with your provider, or enter a new e-Visit to address those concerns.  You will get an e-mail in the next two days asking about your experience.  I hope that your e-visit has been valuable and will speed your recovery. Thank you for using e-visits.  Meds ordered this encounter  Medications   baclofen (LIORESAL) 10 MG tablet    Sig: Take 1 tablet (10 mg total) by mouth 3 (three) times daily.    Dispense:  30 each    Refill:  0     I spent approximately 5 minutes reviewing the patient's history, current symptoms and coordinating their care today.

## 2022-11-02 ENCOUNTER — Ambulatory Visit: Payer: Medicaid Other | Admitting: Podiatry

## 2022-11-02 VITALS — BP 137/84 | HR 101

## 2022-11-02 DIAGNOSIS — B351 Tinea unguium: Secondary | ICD-10-CM | POA: Diagnosis not present

## 2022-11-02 MED ORDER — TERBINAFINE HCL 250 MG PO TABS: 250.0000 mg | ORAL_TABLET | Freq: Every day | ORAL | 0 refills | Status: DC

## 2022-11-02 NOTE — Progress Notes (Signed)
Chief Complaint  Patient presents with   Nail Problem    "I've been treated for nail fungus about 2-3 months ago.  It seems to be turning yellow again.  It's the big one that causing the problem."    Subjective: 27 y.o. female presenting today for follow-up evaluation of fungal nail infection to the left great toe and third toe.  Last seen in the office 10/20/2021.  At that time oral Lamisil was prescribed.  Overall there has been significant improvement.  She says that when she discontinued the oral Lamisil she began to notice regression and recurrence of the fungal nail infection.  She tolerated the oral Lamisil just fine with no complaints.  There is been no change in her past medical history   Brief history: Patient states she has had discolored thickened nails to the left foot since she was 27 years old.  Most recently she has tried multiple over-the-counter and home remedies with no improvement.  She states that she has been very diligent about trying to get rid of the toenail fungus over the last 3-4 years.  Past Medical History:  Diagnosis Date   Allergy    Anemia    during pregnancies   Anxiety    Depression    Elevated liver enzymes    with both pregnancies, no problems after pregnancies   Frequent headaches    no longer having frequent headaches   History of pre-eclampsia    PCOS (polycystic ovarian syndrome)    Past Surgical History:  Procedure Laterality Date   LAPAROSCOPIC SALPINGO OOPHERECTOMY Left 02/23/2020   Procedure: LAPAROSCOPIC SALPINGO OOPHORECTOMY;  Surgeon: Tereso Newcomer, MD;  Location: [facility] OR;  Service: Gynecology;  Laterality: Left;   TOOTH EXTRACTION     local anesthestic only   Allergies  Allergen Reactions   Etodolac Nausea Only   Meloxicam Other (See Comments)    Abdominal pain   Flexeril [Cyclobenzaprine] Nausea Only   Nsaids Other (See Comments)    Stomach pain. Pt ok with Motrin   Prozac [Fluoxetine Hcl] Other (See Comments)    Felt  bad/awful   Hydroxyzine Nausea Only    LT foot 10/20/2021   LT foot 11/02/2022  Objective: Physical Exam General: The patient is alert and oriented x3 in no acute distress.  Dermatology: Overall improvement.  There continues to be some slight hyperkeratotic, discolored, thickened, onychodystrophy noted to the first and third digits left foot. Skin is warm, dry and supple bilateral lower extremities. Negative for open lesions or macerations.  Vascular: Palpable pedal pulses bilaterally. No edema or erythema noted. Capillary refill within normal limits.  Neurological: Grossly intact via light touch  Musculoskeletal Exam: No pedal deformity  Assessment: #1 Onychomycosis of toenails first and third digits left foot  -Patient evaluated -Overall improvement of the appearance of the nail. -Patient tolerated the oral Lamisil just fine.  No complaints or change in medical history.  CMP 06/04/2022 hepatic markers WNL (AST, ALT, AP).  Refill prescription for Lamisil 250 mg #90 daily -Continue topical Tolcylen daily dispensed here in the office -Return to clinic 6 months   Felecia Shelling, DPM [facility]  Dr. Felecia Shelling, DPM    [address], [place] [address]  Office [phone]  Fax [phone]

## 2022-11-15 ENCOUNTER — Encounter: Payer: Self-pay | Admitting: Obstetrics and Gynecology

## 2022-11-21 ENCOUNTER — Encounter: Payer: Self-pay | Admitting: Obstetrics & Gynecology

## 2022-11-21 ENCOUNTER — Other Ambulatory Visit: Payer: Self-pay | Admitting: Obstetrics & Gynecology

## 2022-11-21 DIAGNOSIS — F419 Anxiety disorder, unspecified: Secondary | ICD-10-CM

## 2022-11-21 MED ORDER — BUPROPION HCL ER (XL) 150 MG PO TB24: 150.0000 mg | ORAL_TABLET | Freq: Every day | ORAL | 3 refills | Status: DC

## 2022-11-21 MED ORDER — VENLAFAXINE HCL ER 150 MG PO CP24: 150.0000 mg | ORAL_CAPSULE | Freq: Every day | ORAL | 3 refills | Status: DC

## 2022-11-22 ENCOUNTER — Telehealth: Payer: Medicaid Other | Admitting: Physician Assistant

## 2022-11-22 DIAGNOSIS — L03114 Cellulitis of left upper limb: Secondary | ICD-10-CM | POA: Diagnosis not present

## 2022-11-23 MED ORDER — CEPHALEXIN 500 MG PO CAPS: 500.0000 mg | ORAL_CAPSULE | Freq: Four times a day (QID) | ORAL | 0 refills | Status: DC

## 2022-11-23 NOTE — Progress Notes (Signed)
E Visit for Cellulitis  We are sorry that you are not feeling well. Here is how we plan to help!  Based on what you shared with me it looks like you have cellulitis.  Cellulitis looks like areas of skin redness, swelling, and warmth; it develops as a result of bacteria entering under the skin. Little red spots and/or bleeding can be seen in skin, and tiny surface sacs containing fluid can occur. Fever can be present. Cellulitis is almost always on one side of a body, and the lower limbs are the most common site of involvement.   I have prescribed:  Keflex 500mg take one by mouth four times a day for 5 days  HOME CARE:  Take your medications as ordered and take all of them, even if the skin irritation appears to be healing.   GET HELP RIGHT AWAY IF:  Symptoms that don't begin to go away within 48 hours. Severe redness persists or worsens If the area turns color, spreads or swells. If it blisters and opens, develops yellow-brown crust or bleeds. You develop a fever or chills. If the pain increases or becomes unbearable.  Are unable to keep fluids and food down.  MAKE SURE YOU   Understand these instructions. Will watch your condition. Will get help right away if you are not doing well or get worse.  Thank you for choosing an e-visit.  Your e-visit answers were reviewed by a board certified advanced clinical practitioner to complete your personal care plan. Depending upon the condition, your plan could have included both over the counter or prescription medications.  Please review your pharmacy choice. Make sure the pharmacy is open so you can pick up prescription now. If there is a problem, you may contact your provider through MyChart messaging and have the prescription routed to another pharmacy.  Your safety is important to us. If you have drug allergies check your prescription carefully.   For the next 24 hours you can use MyChart to ask questions about today's visit, request a  non-urgent call back, or ask for a work or school excuse. You will get an email in the next two days asking about your experience. I hope that your e-visit has been valuable and will speed your recovery.  I have spent 5 minutes in review of e-visit questionnaire, review and updating patient chart, medical decision making and response to patient.   Deneene Tarver M Quintella Mura, PA-C  

## 2022-12-12 ENCOUNTER — Emergency Department (HOSPITAL_COMMUNITY)
Admission: EM | Admit: 2022-12-12 | Discharge: 2022-12-13 | Disposition: A | Discharge status: Home / Self Care | Payer: Medicaid Other | Attending: Emergency Medicine | Admitting: Emergency Medicine

## 2022-12-12 ENCOUNTER — Other Ambulatory Visit: Payer: Self-pay

## 2022-12-12 ENCOUNTER — Emergency Department (HOSPITAL_COMMUNITY): Payer: Medicaid Other

## 2022-12-12 ENCOUNTER — Encounter (HOSPITAL_COMMUNITY): Payer: Self-pay | Admitting: *Deleted

## 2022-12-12 DIAGNOSIS — N939 Abnormal uterine and vaginal bleeding, unspecified: Secondary | ICD-10-CM | POA: Insufficient documentation

## 2022-12-12 DIAGNOSIS — R102 Pelvic and perineal pain: Secondary | ICD-10-CM | POA: Insufficient documentation

## 2022-12-12 LAB — URINALYSIS, ROUTINE W REFLEX MICROSCOPIC
Bilirubin Urine: NEGATIVE
Glucose, UA: NEGATIVE mg/dL
Ketones, ur: NEGATIVE mg/dL
Leukocytes,Ua: NEGATIVE
Nitrite: NEGATIVE
Protein, ur: NEGATIVE mg/dL
RBC / HPF: >50 RBC/hpf (ref 0–5)
Specific Gravity, Urine: 1.026 (ref 1.005–1.030)
pH: 6 (ref 5.0–8.0)

## 2022-12-12 LAB — CBC WITH DIFFERENTIAL/PLATELET
Abs Immature Granulocytes: 0.03 10*3/uL (ref 0.00–0.07)
Basophils Absolute: 0.1 10*3/uL (ref 0.0–0.1)
Basophils Relative: 1 %
Eosinophils Absolute: 0.1 10*3/uL (ref 0.0–0.5)
Eosinophils Relative: 2 %
HCT: 33.3 % — ABNORMAL LOW (ref 36.0–46.0)
Hemoglobin: 9.7 g/dL — ABNORMAL LOW (ref 12.0–15.0)
Immature Granulocytes: 0 %
Lymphocytes Relative: 36 %
Lymphs Abs: 3 10*3/uL (ref 0.7–4.0)
MCH: 20.6 pg — ABNORMAL LOW (ref 26.0–34.0)
MCHC: 29.1 g/dL — ABNORMAL LOW (ref 30.0–36.0)
MCV: 70.6 fL — ABNORMAL LOW (ref 80.0–100.0)
Monocytes Absolute: 0.4 10*3/uL (ref 0.1–1.0)
Monocytes Relative: 5 %
Neutro Abs: 4.7 10*3/uL (ref 1.7–7.7)
Neutrophils Relative %: 56 %
Platelets: 320 10*3/uL (ref 150–400)
RBC: 4.72 MIL/uL (ref 3.87–5.11)
RDW: 17.9 % — ABNORMAL HIGH (ref 11.5–15.5)
WBC: 8.3 10*3/uL (ref 4.0–10.5)
nRBC: 0 % (ref 0.0–0.2)

## 2022-12-12 LAB — BASIC METABOLIC PANEL
Anion gap: 9 (ref 5–15)
BUN: 10 mg/dL (ref 6–20)
CO2: 22 mmol/L (ref 22–32)
Calcium: 8.7 mg/dL — ABNORMAL LOW (ref 8.9–10.3)
Chloride: 107 mmol/L (ref 98–111)
Creatinine, Ser: 0.84 mg/dL (ref 0.44–1.00)
GFR, Estimated: >60 mL/min (ref >60)
Glucose, Bld: 112 mg/dL — ABNORMAL HIGH (ref 70–99)
Potassium: 3.6 mmol/L (ref 3.5–5.1)
Sodium: 138 mmol/L (ref 135–145)

## 2022-12-12 LAB — I-STAT BETA HCG BLOOD, ED (MC, WL, AP ONLY): I-stat hCG, quantitative: <5 m[IU]/mL (ref <5)

## 2022-12-12 NOTE — Discharge Instructions (Signed)
You were evaluated in the Emergency Department and after careful evaluation, we did not find any emergent condition requiring admission or further testing in the hospital.  Your exam/testing today is overall reassuring.  Recommend using the Megace as needed and following up with Dr. Vergie Living.  We messaged him and informed him of your ED visit and the sample that was sent to the lab.  Please return to the Emergency Department if you experience any worsening of your condition.   Thank you for allowing [place] to be a part of your care.

## 2022-12-12 NOTE — ED Provider Notes (Signed)
[facility] Emergency Department Provider Note MRN:  [id number]  Arrival date & time: 12/13/22     Chief Complaint   Vaginal Bleeding   History of Present Illness   Latoya Mora is a 27 y.o. year-old female with a history of PCOS presenting to the ED with chief complaint of vaginal bleeding.  Vaginal bleeding persistently for the past 3 months.  She took a course of Megace for a while and it did not stop the bleeding but it decreased to spotting.  She stopped the Megace 2 weeks ago and then the bleeding came back.  The bleeding was much heavier than normal this evening with clots and also passage of tissue.  Endorsing occasional abdominal cramps consistent with "period cramps".  Denies fever, no other complaints.  Review of Systems  A thorough review of systems was obtained and all systems are negative except as noted in the HPI and PMH.   Patient's Health History    Past Medical History:  Diagnosis Date   Allergy    Anemia    during pregnancies   Anxiety    Depression    Elevated liver enzymes    with both pregnancies, no problems after pregnancies   Frequent headaches    no longer having frequent headaches   History of pre-eclampsia    PCOS (polycystic ovarian syndrome)     Past Surgical History:  Procedure Laterality Date   LAPAROSCOPIC SALPINGO OOPHERECTOMY Left 02/23/2020   Procedure: LAPAROSCOPIC SALPINGO OOPHORECTOMY;  Surgeon: Tereso Newcomer, MD;  Location: [facility] OR;  Service: Gynecology;  Laterality: Left;   TOOTH EXTRACTION     local anesthestic only    Family History  Problem Relation Age of Onset   Depression Mother    Anxiety disorder Mother    Heart disease Mother    Skin cancer Mother    Scoliosis Mother    Alcohol abuse Father    Bipolar disorder Father    Depression Father    Osteochondroma Father    COPD Maternal Grandmother    Scoliosis Maternal Grandmother    COPD Maternal Grandfather    Skin cancer Maternal  Grandfather    Parkinson's disease Maternal Grandfather    Arthritis Paternal Grandmother    Diabetes Paternal Grandmother    Bipolar disorder Paternal Grandfather    Osteochondroma Sister    Osteochondroma Brother    Cancer Neg Hx     Social History   Socioeconomic History   Marital status: Married    Spouse name: Monasia Spedale   Number of children: Not on file   Years of education: Not on file   Highest education level: Not on file  Occupational History   Not on file  Tobacco Use   Smoking status: Never   Smokeless tobacco: Never  Vaping Use   Vaping Use: Never used  Substance and Sexual Activity   Alcohol use: Not Currently   Drug use: No   Sexual activity: Yes    Birth control/protection: None  Other Topics Concern   Not on file  Social History Narrative   Not on file   Social Determinants of Health   Financial Resource Strain: Low Risk  (05/21/2018)   Overall Financial Resource Strain (CARDIA)    Difficulty of Paying Living Expenses: Not hard at all  Food Insecurity: No Food Insecurity (05/21/2018)   Hunger Vital Sign    Worried About Running Out of Food in the Last Year: Never true    Ran Out of  Food in the Last Year: Never true  Transportation Needs: Unknown (05/21/2018)   PRAPARE - Administrator, Civil Service (Medical): No    Lack of Transportation (Non-Medical): Not on file  Physical Activity: Not on file  Stress: Stress Concern Present (05/21/2018)   [employer] of Occupational Health - Occupational Stress Questionnaire    Feeling of Stress : To some extent  Social Connections: Not on file  Intimate Partner Violence: Not At Risk (05/21/2018)   Humiliation, Afraid, Rape, and Kick questionnaire    Fear of Current or Ex-Partner: No    Emotionally Abused: No    Physically Abused: No    Sexually Abused: No     Physical Exam   Vitals:   12/12/22 1846 12/12/22 2324  BP: (!) 139/102 (!) 159/101  Pulse: 100 97  Resp: 18 16  Temp:  98.9 F (37.2 C) 99.4 F (37.4 C)  SpO2: 100% 100%    CONSTITUTIONAL: Well-appearing, NAD NEURO/PSYCH:  Alert and oriented x 3, no focal deficits EYES:  eyes equal and reactive ENT/NECK:  no LAD, no JVD CARDIO: Regular rate, well-perfused, normal S1 and S2 PULM:  CTAB no wheezing or rhonchi GI/GU:  non-distended, non-tender MSK/SPINE:  No gross deformities, no edema SKIN:  no rash, atraumatic   *Additional and/or pertinent findings included in MDM below  Diagnostic and Interventional Summary    EKG Interpretation  Date/Time:    Ventricular Rate:    PR Interval:    QRS Duration:   QT Interval:    QTC Calculation:   R Axis:     Text Interpretation:         Labs Reviewed  CBC WITH DIFFERENTIAL/PLATELET - Abnormal; Notable for the following components:      Result Value   Hemoglobin 9.7 (*)    HCT 33.3 (*)    MCV 70.6 (*)    MCH 20.6 (*)    MCHC 29.1 (*)    RDW 17.9 (*)    All other components within normal limits  BASIC METABOLIC PANEL - Abnormal; Notable for the following components:   Glucose, Bld 112 (*)    Calcium 8.7 (*)    All other components within normal limits  URINALYSIS, ROUTINE W REFLEX MICROSCOPIC - Abnormal; Notable for the following components:   Hgb urine dipstick LARGE (*)    Bacteria, UA RARE (*)    All other components within normal limits  I-STAT BETA HCG BLOOD, ED ([facility], [facility], [facility] ONLY)  SURGICAL PATHOLOGY    US Pelvis Complete  Final Result    US Transvaginal Non-OB  Final Result    [place] Art/Ven Flow Abd Pelv Doppler  Final Result      Medications - No data to display   Procedures  /  Critical Care Procedures  ED Course and Medical Decision Making  Initial Impression and Ddx Differential diagnosis includes miscarriage, abnormal uterine bleeding, fibroids, ectopic pregnancy.  Past medical/surgical history that increases complexity of ED encounter: None PCOS  Interpretation of Diagnostics I personally reviewed the laboratory  assessment and my interpretation is as follows: No significant blood count or electrolyte disturbance.  Mild anemia but similar to prior values.  hCG is negative.  Ultrasound showing a slightly thickened endometrium that may need further diagnostics but otherwise no emergent process.  Patient Reassessment and Ultimate Disposition/Management     Patient brought the past tissue with her in a Ziploc bag.  It definitely does look and feel like tissue rather than blood clots.  Will send to surgical pathology for closer assessment.  She will follow-up with OB/GYN.  Patient management required discussion with the following services or consulting groups:  None  Complexity of Problems Addressed Acute illness or injury that poses threat of life of bodily function  Additional Data Reviewed and Analyzed Further history obtained from: Further history from spouse/family member  Additional Factors Impacting ED Encounter Risk Consideration of hospitalization  Elmer Sow. Pilar Plate, MD [facility] Emergency Medicine [facility] [email or web address]  Final Clinical Impressions(s) / ED Diagnoses     ICD-10-CM   1. Vaginal bleeding  N93.9       ED Discharge Orders     None        Discharge Instructions Discussed with and Provided to Patient:    Discharge Instructions      You were evaluated in the Emergency Department and after careful evaluation, we did not find any emergent condition requiring admission or further testing in the hospital.  Your exam/testing today is overall reassuring.  Recommend using the Megace as needed and following up with Dr. Vergie Living.  We messaged him and informed him of your ED visit and the sample that was sent to the lab.  Please return to the Emergency Department if you experience any worsening of your condition.   Thank you for allowing [place] to be a part of your care.      Sabas Sous, MD 12/13/22 Marlyne Beards

## 2022-12-12 NOTE — ED Triage Notes (Signed)
The pt has been having a period for 3 months she was placed on a med  that she stopped taking 2 weeks ago  now her bleeding is heavier and today she passed some large clots or tissue

## 2022-12-12 NOTE — ED Provider Triage Note (Signed)
Emergency Medicine Provider Triage Evaluation Note  Latoya Mora , a 27 y.o. female  was evaluated in triage.  Pt complains of vaginal bleeding.  Became heavier today.  Has taken medication to help stop however it slowed it down but did not completely stop the bleeding.  She states she passed some tissue that was not blood clot.  Denies lightheadedness, chest pain, shortness of breath.  Review of Systems  Positive: As above Negative: As above  Physical Exam  BP (!) 139/102   Pulse 100   Temp 98.9 F (37.2 C)   Resp 18   Ht 5\' 8"  (1.727 m)   Wt (!) 155.1 kg   LMP 12/12/2022   SpO2 100%   BMI 51.99 kg/m  Gen:   Awake, no distress   Resp:  Normal effort  MSK:   Moves extremities without difficulty  Other:    Medical Decision Making  Medically screening exam initiated at 6:53 PM.  Appropriate orders placed.  Latoya Mora was informed that the remainder of the evaluation will be completed by another provider, this initial triage assessment does not replace that evaluation, and the importance of remaining in the ED until their evaluation is complete.     Marita [place], PA-C 12/12/22 1853

## 2022-12-13 ENCOUNTER — Encounter: Payer: Self-pay | Admitting: Obstetrics and Gynecology

## 2022-12-14 ENCOUNTER — Other Ambulatory Visit: Payer: Self-pay | Admitting: *Deleted

## 2022-12-14 LAB — SURGICAL PATHOLOGY

## 2022-12-14 MED ORDER — MEGESTROL ACETATE 40 MG PO TABS: 40.0000 mg | ORAL_TABLET | Freq: Two times a day (BID) | ORAL | 0 refills | Status: DC

## 2022-12-17 ENCOUNTER — Ambulatory Visit (INDEPENDENT_AMBULATORY_CARE_PROVIDER_SITE_OTHER): Payer: Self-pay | Admitting: Obstetrics & Gynecology

## 2022-12-17 ENCOUNTER — Encounter: Payer: Self-pay | Admitting: Obstetrics & Gynecology

## 2022-12-17 VITALS — BP 141/90 | HR 123 | Wt 337.0 lb

## 2022-12-17 DIAGNOSIS — N939 Abnormal uterine and vaginal bleeding, unspecified: Secondary | ICD-10-CM

## 2022-12-17 DIAGNOSIS — E282 Polycystic ovarian syndrome: Secondary | ICD-10-CM

## 2022-12-17 MED ORDER — MEGESTROL ACETATE 40 MG PO TABS: 80.0000 mg | ORAL_TABLET | Freq: Two times a day (BID) | ORAL | 5 refills | Status: DC

## 2022-12-17 MED ORDER — METFORMIN HCL 1000 MG PO TABS: ORAL_TABLET | ORAL | 5 refills | Status: DC

## 2022-12-17 NOTE — Progress Notes (Signed)
GYNECOLOGY OFFICE VISIT NOTE  History:   Latoya Mora is a 27 y.o. [phone] with history of PCOS and morbid obesity here today for follow up for heavy AUB. Has been on Megace 40 mg bid, was having heavy AUB and was evaluated in the ER. Passed some tissue, evaluation was benign (see below).   Bleeding has improved, just having small clots now.  Negative pelvic ultrasound on 12/12/22 . She denies any abnormal vaginal discharge, pelvic pain or other concerns.    Past Medical History:  Diagnosis Date   Allergy    Anemia    during pregnancies   Anxiety    Depression    Elevated liver enzymes    with both pregnancies, no problems after pregnancies   Frequent headaches    no longer having frequent headaches   History of pre-eclampsia    PCOS (polycystic ovarian syndrome)     Past Surgical History:  Procedure Laterality Date   LAPAROSCOPIC SALPINGO OOPHERECTOMY Left 02/23/2020   Procedure: LAPAROSCOPIC SALPINGO OOPHORECTOMY;  Surgeon: Tereso Newcomer, MD;  Location: [facility] OR;  Service: Gynecology;  Laterality: Left;   TOOTH EXTRACTION     local anesthestic only    The following portions of the patient's history were reviewed and updated as appropriate: allergies, current medications, past family history, past medical history, past social history, past surgical history and problem list.   Health Maintenance:  Normal pap in 2019, waiting for insurance to book next pap appointment  Review of Systems:  Pertinent items noted in HPI and remainder of comprehensive ROS otherwise negative.  Physical Exam:  BP (!) 141/90   Pulse (!) 123   Wt (!) 337 lb (152.9 kg)   LMP 12/12/2022   BMI 51.24 kg/m  CONSTITUTIONAL: Well-developed, well-nourished female in no acute distress.  HEENT:  Normocephalic, atraumatic. External right and left ear normal. No scleral icterus.  NECK: Normal range of motion, supple, no masses noted on observation SKIN: No rash noted. Not diaphoretic. No erythema. No  pallor. MUSCULOSKELETAL: Normal range of motion. No edema noted. NEUROLOGIC: Alert and oriented to person, place, and time. Normal muscle tone coordination. No cranial nerve deficit noted. PSYCHIATRIC: Normal mood and affect. Normal behavior. Normal judgment and thought content. CARDIOVASCULAR: Normal heart rate noted RESPIRATORY: Effort and breath sounds normal, no problems with respiration noted ABDOMEN: No masses noted. No other overt distention noted.   PELVIC: Deferred  Labs and Imaging Results for orders placed or performed during the hospital encounter of 12/12/22 (from the past 168 hour(s))  Urinalysis, Routine w reflex microscopic -Urine, Clean Catch   Collection Time: 12/12/22  6:53 PM  Result Value Ref Range   Color, Urine YELLOW YELLOW   APPearance CLEAR CLEAR   Specific Gravity, Urine 1.026 1.005 - 1.030   pH 6.0 5.0 - 8.0   Glucose, UA NEGATIVE NEGATIVE mg/dL   Hgb urine dipstick LARGE (A) NEGATIVE   Bilirubin Urine NEGATIVE NEGATIVE   Ketones, ur NEGATIVE NEGATIVE mg/dL   Protein, ur NEGATIVE NEGATIVE mg/dL   Nitrite NEGATIVE NEGATIVE   Leukocytes,Ua NEGATIVE NEGATIVE   RBC / HPF >50 0 - 5 RBC/hpf   WBC, UA 0-5 0 - 5 WBC/hpf   Bacteria, UA RARE (A) NONE SEEN   Squamous Epithelial / HPF 0-5 0 - 5 /HPF   Mucus PRESENT   CBC with Differential   Collection Time: 12/12/22  7:16 PM  Result Value Ref Range   WBC 8.3 4.0 - 10.5 K/uL   RBC 4.72  3.87 - 5.11 MIL/uL   Hemoglobin 9.7 (L) 12.0 - 15.0 g/dL   HCT 16.1 (L) 09.6 - 04.5 %   MCV 70.6 (L) 80.0 - 100.0 fL   MCH 20.6 (L) 26.0 - 34.0 pg   MCHC 29.1 (L) 30.0 - 36.0 g/dL   RDW 40.9 (H) 81.1 - 91.4 %   Platelets 320 150 - 400 K/uL   nRBC 0.0 0.0 - 0.2 %   Neutrophils Relative % 56 %   Neutro Abs 4.7 1.7 - 7.7 K/uL   Lymphocytes Relative 36 %   Lymphs Abs 3.0 0.7 - 4.0 K/uL   Monocytes Relative 5 %   Monocytes Absolute 0.4 0.1 - 1.0 K/uL   Eosinophils Relative 2 %   Eosinophils Absolute 0.1 0.0 - 0.5 K/uL    Basophils Relative 1 %   Basophils Absolute 0.1 0.0 - 0.1 K/uL   Immature Granulocytes 0 %   Abs Immature Granulocytes 0.03 0.00 - 0.07 K/uL  Basic metabolic panel   Collection Time: 12/12/22  7:16 PM  Result Value Ref Range   Sodium 138 135 - 145 mmol/L   Potassium 3.6 3.5 - 5.1 mmol/L   Chloride 107 98 - 111 mmol/L   CO2 22 22 - 32 mmol/L   Glucose, Bld 112 (H) 70 - 99 mg/dL   BUN 10 6 - 20 mg/dL   Creatinine, Ser 7.82 0.44 - 1.00 mg/dL   Calcium 8.7 (L) 8.9 - 10.3 mg/dL   GFR, Estimated >95 >62 mL/min   Anion gap 9 5 - 15  I-Stat beta hCG blood, ED ([facility], [facility], [facility] only)   Collection Time: 12/12/22  7:21 PM  Result Value Ref Range   I-stat hCG, quantitative <5.0 <5 mIU/mL   Comment 3          Surgical pathology   Collection Time: 12/12/22 11:37 PM  Result Value Ref Range   SURGICAL PATHOLOGY      SURGICAL PATHOLOGY CASE: [id number] PATIENT: Latoya Mora Surgical Pathology Report     Clinical History: (cm)     FINAL MICROSCOPIC DIAGNOSIS:  A. VAGINAL TISSUE: -  Fragments of abundant decidualized stroma with focal evidence of necrosis and appearance minimal attenuated endometrial epithelium in the background of blood.  Note: No immature chorionic villi are identified.  Clinical correlation necessary.    GROSS DESCRIPTION:  Specimen is received fresh and consists of a 5.7 x 5.2 x 1.8 cm aggregate of pink-purple soft tissue.  Sectioning reveals tan-pink to red-purple hemorrhagic cut surfaces.  Representative sections are submitted in 5 cassettes.  Lovey Newcomer 12/13/2022)    Final Diagnosis performed by Orene Desanctis DO.   Electronically signed 12/14/2022 Technical component performed at [employer]. [facility], [address] [place], [place] [address].  Professional component performed at [facility], [address],  [place], [place] [address].  Immunohistochemistry Technical component (if applicable) was performed at [facility]. [address], [place], [place] [address].   IMMUNOHISTOCHEMISTRY DISCLAIMER (if applicable): Some of these immunohistochemical stains may have been developed and the performance characteristics determine by [facility]. Some may not have been cleared or approved by the U.S. Food and Drug Administration. The FDA has determined that such clearance or approval is not necessary. This test is used for clinical purposes. It should not be regarded as investigational or for research. This laboratory is certified under the Clinical Laboratory Improvement Amendments of 1988 (CLIA-88) as qualified to perform high complexity clinical  laboratory testing.  The controls stained appropriately.   IHC stains are performed on formalin fixed, paraffin embedded tissue using a 3,3"diaminobenzidine (DAB) chromogen and Leica Bond Autostainer Sy stem. The staining intensity of the nucleus is score manually and is reported as the percentage of tumor cell nuclei demonstrating specific nuclear staining. The specimens are fixed in 10% Neutral Formalin for at least 6 hours and up to 72hrs. These tests are validated on decalcified tissue. Results should be interpreted with caution given the possibility of false negative results on decalcified specimens. Antibody Clones are as follows ER-clone 9F, PR-clone 16, Ki67- clone MM1. Some of these immunohistochemical stains may have been developed and the performance characteristics determined by [facility].    US Pelvis Complete  Result Date: 12/12/2022 CLINICAL DATA:  Bleeding and pelvic pain. Left salpingo-oophorectomy EXAM: TRANSABDOMINAL AND TRANSVAGINAL ULTRASOUND OF PELVIS DOPPLER ULTRASOUND OF OVARIES TECHNIQUE: Both transabdominal and transvaginal ultrasound examinations of the pelvis were performed. Transabdominal technique was performed for global imaging of the pelvis including uterus, ovaries, adnexal regions,  and pelvic cul-de-sac. It was necessary to proceed with endovaginal exam following the transabdominal exam to visualize the uterus and endometrium. Color and duplex Doppler ultrasound was utilized to evaluate blood flow to the ovaries. COMPARISON:  CT abdomen pelvis 06/04/2022 FINDINGS: Uterus Measurements: 9.4 x 5.2 x 6.2 cm = volume: 159 mL. No fibroids or other mass visualized. Endometrium Thickness: 8 mm.  No focal abnormality visualized. Right ovary Measurements: 3.9 x 2.6 x 4.1 cm = volume: 22 mL. Normal appearance/no adnexal mass. Left ovary Surgically absent. Pulsed Doppler evaluation of both ovaries demonstrates normal low-resistance arterial and venous waveforms. Other findings No abnormal free fluid. IMPRESSION: 1. No significant sonographic abnormality of the uterus or right ovary. 2. Surgically absent left ovary. 3. Endometrium measures 8 mm. If bleeding remains unresponsive to hormonal or medical therapy, sonohysterogram should be considered for focal lesion work-up. (Ref: Radiological Reasoning: Algorithmic Workup of Abnormal Vaginal Bleeding with Endovaginal Sonography and Sonohysterography. AJR 2008; [id number]) Electronically Signed   By: Minerva Fester M.D.   On: 12/12/2022 20:13   US Transvaginal Non-OB  Result Date: 12/12/2022 CLINICAL DATA:  Bleeding and pelvic pain. Left salpingo-oophorectomy EXAM: TRANSABDOMINAL AND TRANSVAGINAL ULTRASOUND OF PELVIS DOPPLER ULTRASOUND OF OVARIES TECHNIQUE: Both transabdominal and transvaginal ultrasound examinations of the pelvis were performed. Transabdominal technique was performed for global imaging of the pelvis including uterus, ovaries, adnexal regions, and pelvic cul-de-sac. It was necessary to proceed with endovaginal exam following the transabdominal exam to visualize the uterus and endometrium. Color and duplex Doppler ultrasound was utilized to evaluate blood flow to the ovaries. COMPARISON:  CT abdomen pelvis 06/04/2022 FINDINGS: Uterus  Measurements: 9.4 x 5.2 x 6.2 cm = volume: 159 mL. No fibroids or other mass visualized. Endometrium Thickness: 8 mm.  No focal abnormality visualized. Right ovary Measurements: 3.9 x 2.6 x 4.1 cm = volume: 22 mL. Normal appearance/no adnexal mass. Left ovary Surgically absent. Pulsed Doppler evaluation of both ovaries demonstrates normal low-resistance arterial and venous waveforms. Other findings No abnormal free fluid. IMPRESSION: 1. No significant sonographic abnormality of the uterus or right ovary. 2. Surgically absent left ovary. 3. Endometrium measures 8 mm. If bleeding remains unresponsive to hormonal or medical therapy, sonohysterogram should be considered for focal lesion work-up. (Ref: Radiological Reasoning: Algorithmic Workup of Abnormal Vaginal Bleeding with Endovaginal Sonography and Sonohysterography. AJR 2008; [id number]:[id number]) Electronically Signed   By: Minerva Fester M.D.   On: 12/12/2022 20:13   [place] Art/Ven Flow  Abd Pelv Doppler  Result Date: 12/12/2022 CLINICAL DATA:  Bleeding and pelvic pain. Left salpingo-oophorectomy EXAM: TRANSABDOMINAL AND TRANSVAGINAL ULTRASOUND OF PELVIS DOPPLER ULTRASOUND OF OVARIES TECHNIQUE: Both transabdominal and transvaginal ultrasound examinations of the pelvis were performed. Transabdominal technique was performed for global imaging of the pelvis including uterus, ovaries, adnexal regions, and pelvic cul-de-sac. It was necessary to proceed with endovaginal exam following the transabdominal exam to visualize the uterus and endometrium. Color and duplex Doppler ultrasound was utilized to evaluate blood flow to the ovaries. COMPARISON:  CT abdomen pelvis 06/04/2022 FINDINGS: Uterus Measurements: 9.4 x 5.2 x 6.2 cm = volume: 159 mL. No fibroids or other mass visualized. Endometrium Thickness: 8 mm.  No focal abnormality visualized. Right ovary Measurements: 3.9 x 2.6 x 4.1 cm = volume: 22 mL. Normal appearance/no adnexal mass. Left ovary Surgically absent. Pulsed  Doppler evaluation of both ovaries demonstrates normal low-resistance arterial and venous waveforms. Other findings No abnormal free fluid. IMPRESSION: 1. No significant sonographic abnormality of the uterus or right ovary. 2. Surgically absent left ovary. 3. Endometrium measures 8 mm. If bleeding remains unresponsive to hormonal or medical therapy, sonohysterogram should be considered for focal lesion work-up. (Ref: Radiological Reasoning: Algorithmic Workup of Abnormal Vaginal Bleeding with Endovaginal Sonography and Sonohysterography. AJR 2008; 696:[id number]) Electronically Signed   By: Minerva Fester M.D.   On: 12/12/2022 20:13      Assessment and Plan:     1. Abnormal uterine bleeding (AUB) Continue Megace for now, can increase to 80 mg po bid if needed. Discussed progestin IUD as alternative, she is anxious about this. Discussed that weight loss will be the best long term management modality.  Also discussed surgical management, she is unsure if she is done with childbearing. Not the ideal candidate for endometrial ablation (IUD recommended instead).  Hysterectomy can be considered if done with childbearing and desires definitive management, but briefly discussed surgical risks. Information given to her to review. - megestrol (MEGACE) 40 MG tablet; Take 2 tablets (80 mg total) by mouth 2 (two) times daily.  Dispense: 120 tablet; Refill: 5  2. PCOS (polycystic ovarian syndrome) Refilled Metformin for management of her PCOS, will monitor effect.  Can also help with weight loss, this is the best way to manage PCOS. - metFORMIN (GLUCOPHAGE) 1000 MG tablet; Take 1/2 tablet by mouth daily for one week. Then increase to one tablet daily for one week.  Then one tablet twice a day.  Dispense: 60 tablet; Refill: 5  Routine preventative health maintenance measures emphasized, referred to [facility] for pap smear. Please refer to After Visit Summary for other counseling recommendations.   Return in about 2  months (around 02/16/2023) for Follow up AUB (can be virtual).    I spent 30 minutes dedicated to the care of this patient including pre-visit review of records, face to face time with the patient discussing her conditions and treatments and post visit orders.    Jaynie Collins, MD, FACOG Obstetrician & Gynecologist, [facility] for [employer], [facility]

## 2023-01-09 ENCOUNTER — Telehealth: Payer: Self-pay

## 2023-01-09 NOTE — Telephone Encounter (Signed)
Telephoned patient at mobile number. Left a voice message with [facility] (staff message) contact information.

## 2023-02-13 ENCOUNTER — Encounter: Payer: Self-pay | Admitting: Family Medicine

## 2023-02-13 ENCOUNTER — Ambulatory Visit: Payer: Self-pay | Admitting: Family Medicine

## 2023-02-14 NOTE — Progress Notes (Signed)
Patient did not keep appointment today. She may call to reschedule.  

## 2023-03-12 ENCOUNTER — Telehealth: Payer: Self-pay | Admitting: Physician Assistant

## 2023-03-12 DIAGNOSIS — M545 Low back pain, unspecified: Secondary | ICD-10-CM

## 2023-03-12 MED ORDER — BACLOFEN 10 MG PO TABS: 10.0000 mg | ORAL_TABLET | Freq: Three times a day (TID) | ORAL | 0 refills | Status: DC

## 2023-03-12 NOTE — Progress Notes (Signed)

## 2023-03-12 NOTE — Progress Notes (Signed)
I have spent 5 minutes in review of e-visit questionnaire, review and updating patient chart, medical decision making and response to patient.   Mia Milan Cody Jacklynn Dehaas, PA-C    

## 2023-04-12 ENCOUNTER — Encounter: Payer: Self-pay | Admitting: Emergency Medicine

## 2023-04-12 ENCOUNTER — Ambulatory Visit
Admission: EM | Admit: 2023-04-12 | Discharge: 2023-04-12 | Disposition: A | Discharge status: Home / Self Care | Payer: Self-pay | Attending: Family Medicine | Admitting: Family Medicine

## 2023-04-12 DIAGNOSIS — S76319A Strain of muscle, fascia and tendon of the posterior muscle group at thigh level, unspecified thigh, initial encounter: Secondary | ICD-10-CM

## 2023-04-12 MED ORDER — DEXAMETHASONE SODIUM PHOSPHATE 10 MG/ML IJ SOLN
10.0000 mg | Freq: Once | INTRAMUSCULAR | Status: AC
  Administered 2023-04-12: 10 mg via INTRAMUSCULAR

## 2023-04-12 MED ORDER — TIZANIDINE HCL 4 MG PO CAPS: 4.0000 mg | ORAL_CAPSULE | Freq: Three times a day (TID) | ORAL | 0 refills | Status: DC | PRN

## 2023-04-12 NOTE — ED Provider Notes (Signed)
[facility]    CSN: [id number] Arrival date & time: 04/12/23  0931      History   Chief Complaint No chief complaint on file.   HPI Latoya Mora is a 27 y.o. female.   Patient presenting today with about 2 weeks of progressively worsening bilateral hamstring soreness, now extending to lower legs and buttocks.  Worse with laying flat or squatting down.  Denies swelling, discoloration, known injury to the area though she feels that she may have been compensating with her legs for her back pain several weeks ago and moving differently than usual.  Denies numbness, tingling.  Trying baclofen and ibuprofen with no relief.    Past Medical History:  Diagnosis Date   Allergy    Anemia    during pregnancies   Anxiety    Depression    Elevated liver enzymes    with both pregnancies, no problems after pregnancies   Frequent headaches    no longer having frequent headaches   History of pre-eclampsia    PCOS (polycystic ovarian syndrome)     Patient Active Problem List   Diagnosis Date Noted   Acute left-sided low back pain 11/09/2021   Anemia 11/09/2021   Sleep disturbance 11/09/2021   PCOS (polycystic ovarian syndrome) 10/03/2017   Morbid obesity (HCC) 04/03/2017   Anxiety and depression 09/18/2016    Past Surgical History:  Procedure Laterality Date   LAPAROSCOPIC SALPINGO OOPHERECTOMY Left 02/23/2020   Procedure: LAPAROSCOPIC SALPINGO OOPHORECTOMY;  Surgeon: Tereso Newcomer, MD;  Location: [facility] OR;  Service: Gynecology;  Laterality: Left;   TOOTH EXTRACTION     local anesthestic only    OB History     Gravida  2   Para  2   Term  2   Preterm  0   AB  0   Living  2      SAB  0   IAB  0   Ectopic  0   Multiple  0   Live Births  2            Home Medications    Prior to Admission medications   Medication Sig Start Date End Date Taking? Authorizing Provider  tiZANidine (ZANAFLEX) 4 MG capsule Take 1 capsule (4 mg total) by mouth  3 (three) times daily as needed for muscle spasms. Do not drink alcohol or while taking this medication.  May cause drowsiness. 04/12/23  Yes Particia Nearing, PA-C  baclofen (LIORESAL) 10 MG tablet Take 1 tablet (10 mg total) by mouth 3 (three) times daily. 03/12/23   Waldon Merl, PA-C  venlafaxine XR (EFFEXOR-XR) 150 MG 24 hr capsule Take 1 capsule (150 mg total) by mouth daily with breakfast. 11/21/22   Anyanwu, Jethro Bastos, MD    Family History Family History  Problem Relation Age of Onset   Depression Mother    Anxiety disorder Mother    Heart disease Mother    Skin cancer Mother    Scoliosis Mother    Alcohol abuse Father    Bipolar disorder Father    Depression Father    Osteochondroma Father    COPD Maternal Grandmother    Scoliosis Maternal Grandmother    COPD Maternal Grandfather    Skin cancer Maternal Grandfather    Parkinson's disease Maternal Grandfather    Arthritis Paternal Grandmother    Diabetes Paternal Grandmother    Bipolar disorder Paternal Grandfather    Osteochondroma Sister    Osteochondroma Brother  Cancer Neg Hx     Social History Social History   Tobacco Use   Smoking status: Never   Smokeless tobacco: Never  Vaping Use   Vaping status: Never Used  Substance Use Topics   Alcohol use: Not Currently   Drug use: No     Allergies   Etodolac, Meloxicam, Flexeril [cyclobenzaprine], Nsaids, Prozac [fluoxetine hcl], and Hydroxyzine   Review of Systems Review of Systems Per HPI  Physical Exam Triage Vital Signs ED Triage Vitals  Encounter Vitals Group     BP 04/12/23 0938 (!) 146/97     Systolic BP Percentile --      Diastolic BP Percentile --      Pulse Rate 04/12/23 0938 89     Resp 04/12/23 0938 18     Temp 04/12/23 0938 98 F (36.7 C)     Temp Source 04/12/23 0938 Oral     SpO2 04/12/23 0938 95 %     Weight --      Height --      Head Circumference --      Peak Flow --      Pain Score 04/12/23 0939 2     Pain Loc --       Pain Education --      Exclude from Growth Chart --    No data found.  Updated Vital Signs BP (!) 146/97 (BP Location: Right Arm)   Pulse 89   Temp 98 F (36.7 C) (Oral)   Resp 18   LMP 03/21/2023 (Approximate)   SpO2 95%   Visual Acuity Right Eye Distance:   Left Eye Distance:   Bilateral Distance:    Right Eye Near:   Left Eye Near:    Bilateral Near:     Physical Exam Vitals and nursing note reviewed.  Constitutional:      Appearance: Normal appearance. She is not ill-appearing.  HENT:     Head: Atraumatic.  Eyes:     Extraocular Movements: Extraocular movements intact.     Conjunctiva/sclera: Conjunctivae normal.  Cardiovascular:     Rate and Rhythm: Normal rate and regular rhythm.     Heart sounds: Normal heart sounds.  Pulmonary:     Effort: Pulmonary effort is normal.     Breath sounds: Normal breath sounds.  Musculoskeletal:        General: Tenderness present. No swelling or signs of injury. Normal range of motion.     Cervical back: Normal range of motion and neck supple.     Comments: Tender to palpation bilateral posterior upper legs.  No midline spinal tenderness to palpation diffusely.  Normal gait and range of motion and negative straight leg raise bilateral lower extremities  Skin:    General: Skin is warm and dry.     Findings: No bruising or erythema.  Neurological:     Mental Status: She is alert and oriented to person, place, and time.     Motor: No weakness.     Gait: Gait normal.     Comments: Bilateral lower extremities neurovascularly intact  Psychiatric:        Mood and Affect: Mood normal.        Thought Content: Thought content normal.        Judgment: Judgment normal.      UC Treatments / Results  Labs (all labs ordered are listed, but only abnormal results are displayed) Labs Reviewed - No data to display  EKG   Radiology No results found.  Procedures Procedures (including critical care time)  Medications  Ordered in UC Medications  dexamethasone (DECADRON) injection 10 mg (10 mg Intramuscular Given 04/12/23 1038)    Initial Impression / Assessment and Plan / UC Course  I have reviewed the triage vital signs and the nursing notes.  Pertinent labs & imaging results that were available during my care of the patient were reviewed by me and considered in my medical decision making (see chart for details).     Treat with IM Decadron, Zanaflex, heat, massage, stretches.  Return for worsening symptoms.  No red flag findings today.  Final Clinical Impressions(s) / UC Diagnoses   Final diagnoses:  Hamstring strain, unspecified laterality, initial encounter   Discharge Instructions   None    ED Prescriptions     Medication Sig Dispense Auth. Provider   tiZANidine (ZANAFLEX) 4 MG capsule Take 1 capsule (4 mg total) by mouth 3 (three) times daily as needed for muscle spasms. Do not drink alcohol or while taking this medication.  May cause drowsiness. 15 capsule Particia Nearing, [place]      PDMP not reviewed this encounter.   Particia Nearing, [place] 04/12/23 1341

## 2023-04-12 NOTE — ED Triage Notes (Signed)
States having bilateral "hamstring" pain x 2 weeks.  States pain has progressively gotten worse.  Has tried baclofen without relief.  States pain worse when laying down and bending over.

## 2023-04-15 NOTE — Plan of Care (Signed)
CHL Tonsillectomy/Adenoidectomy, Postoperative PEDS care plan entered in error.

## 2023-04-18 ENCOUNTER — Inpatient Hospital Stay (HOSPITAL_COMMUNITY)
Admission: EM | Admit: 2023-04-18 | Discharge: 2023-04-20 | DRG: 029 | Disposition: A | Discharge status: Home / Self Care | Payer: Self-pay | Attending: Neurological Surgery | Admitting: Neurological Surgery

## 2023-04-18 ENCOUNTER — Emergency Department (HOSPITAL_COMMUNITY): Payer: Self-pay

## 2023-04-18 ENCOUNTER — Encounter (HOSPITAL_COMMUNITY): Payer: Self-pay

## 2023-04-18 ENCOUNTER — Other Ambulatory Visit: Payer: Self-pay

## 2023-04-18 DIAGNOSIS — Z833 Family history of diabetes mellitus: Secondary | ICD-10-CM

## 2023-04-18 DIAGNOSIS — E282 Polycystic ovarian syndrome: Secondary | ICD-10-CM | POA: Diagnosis present

## 2023-04-18 DIAGNOSIS — N920 Excessive and frequent menstruation with regular cycle: Secondary | ICD-10-CM | POA: Diagnosis present

## 2023-04-18 DIAGNOSIS — Z90721 Acquired absence of ovaries, unilateral: Secondary | ICD-10-CM

## 2023-04-18 DIAGNOSIS — Z6841 Body Mass Index (BMI) 40.0 and over, adult: Secondary | ICD-10-CM

## 2023-04-18 DIAGNOSIS — Z818 Family history of other mental and behavioral disorders: Secondary | ICD-10-CM

## 2023-04-18 DIAGNOSIS — Z8249 Family history of ischemic heart disease and other diseases of the circulatory system: Secondary | ICD-10-CM

## 2023-04-18 DIAGNOSIS — G834 Cauda equina syndrome: Principal | ICD-10-CM | POA: Diagnosis present

## 2023-04-18 DIAGNOSIS — Z888 Allergy status to other drugs, medicaments and biological substances status: Secondary | ICD-10-CM

## 2023-04-18 DIAGNOSIS — Z825 Family history of asthma and other chronic lower respiratory diseases: Secondary | ICD-10-CM

## 2023-04-18 DIAGNOSIS — Z886 Allergy status to analgesic agent status: Secondary | ICD-10-CM

## 2023-04-18 DIAGNOSIS — M5116 Intervertebral disc disorders with radiculopathy, lumbar region: Secondary | ICD-10-CM | POA: Diagnosis present

## 2023-04-18 DIAGNOSIS — Z820 Family history of epilepsy and other diseases of the nervous system: Secondary | ICD-10-CM

## 2023-04-18 DIAGNOSIS — M2578 Osteophyte, vertebrae: Secondary | ICD-10-CM | POA: Diagnosis present

## 2023-04-18 DIAGNOSIS — M48061 Spinal stenosis, lumbar region without neurogenic claudication: Secondary | ICD-10-CM | POA: Diagnosis present

## 2023-04-18 DIAGNOSIS — D500 Iron deficiency anemia secondary to blood loss (chronic): Secondary | ICD-10-CM | POA: Diagnosis present

## 2023-04-18 LAB — BASIC METABOLIC PANEL
Anion gap: 11 (ref 5–15)
BUN: 11 mg/dL (ref 6–20)
CO2: 22 mmol/L (ref 22–32)
Calcium: 8.8 mg/dL — ABNORMAL LOW (ref 8.9–10.3)
Chloride: 104 mmol/L (ref 98–111)
Creatinine, Ser: 0.5 mg/dL (ref 0.44–1.00)
GFR, Estimated: >60 mL/min (ref >60)
Glucose, Bld: 81 mg/dL (ref 70–99)
Potassium: 3.6 mmol/L (ref 3.5–5.1)
Sodium: 137 mmol/L (ref 135–145)

## 2023-04-18 LAB — CBC WITH DIFFERENTIAL/PLATELET
Abs Immature Granulocytes: 0.05 10*3/uL (ref 0.00–0.07)
Basophils Absolute: 0 10*3/uL (ref 0.0–0.1)
Basophils Relative: 0 %
Eosinophils Absolute: 0.2 10*3/uL (ref 0.0–0.5)
Eosinophils Relative: 2 %
HCT: 37.7 % (ref 36.0–46.0)
Hemoglobin: 10.4 g/dL — ABNORMAL LOW (ref 12.0–15.0)
Immature Granulocytes: 1 %
Lymphocytes Relative: 30 %
Lymphs Abs: 3.2 10*3/uL (ref 0.7–4.0)
MCH: 19.2 pg — ABNORMAL LOW (ref 26.0–34.0)
MCHC: 27.6 g/dL — ABNORMAL LOW (ref 30.0–36.0)
MCV: 69.7 fL — ABNORMAL LOW (ref 80.0–100.0)
Monocytes Absolute: 0.6 10*3/uL (ref 0.1–1.0)
Monocytes Relative: 6 %
Neutro Abs: 6.7 10*3/uL (ref 1.7–7.7)
Neutrophils Relative %: 61 %
Platelets: 285 10*3/uL (ref 150–400)
RBC: 5.41 MIL/uL — ABNORMAL HIGH (ref 3.87–5.11)
RDW: 19 % — ABNORMAL HIGH (ref 11.5–15.5)
WBC: 10.7 10*3/uL — ABNORMAL HIGH (ref 4.0–10.5)
nRBC: 0 % (ref 0.0–0.2)

## 2023-04-18 LAB — URINALYSIS, ROUTINE W REFLEX MICROSCOPIC
Bacteria, UA: NONE SEEN
Bilirubin Urine: NEGATIVE
Glucose, UA: NEGATIVE mg/dL
Hgb urine dipstick: NEGATIVE
Ketones, ur: NEGATIVE mg/dL
Nitrite: NEGATIVE
Protein, ur: NEGATIVE mg/dL
Specific Gravity, Urine: 1.023 (ref 1.005–1.030)
pH: 6 (ref 5.0–8.0)

## 2023-04-18 LAB — PREGNANCY, URINE: Preg Test, Ur: NEGATIVE

## 2023-04-18 MED ORDER — MORPHINE SULFATE (PF) 2 MG/ML IV SOLN
1.0000 mg | INTRAVENOUS | Status: DC | PRN
  Administered 2023-04-18: 2 mg via INTRAVENOUS
  Administered 2023-04-19: 1 mg via INTRAVENOUS
  Filled 2023-04-18 (×2): qty 1

## 2023-04-18 MED ORDER — POLYETHYLENE GLYCOL 3350 17 G PO PACK
17.0000 g | PACK | Freq: Every day | ORAL | Status: DC | PRN
Start: 2023-04-18 — End: 2023-04-20

## 2023-04-18 MED ORDER — DEXAMETHASONE SODIUM PHOSPHATE 10 MG/ML IJ SOLN: 6.0000 mg | Freq: Once | INTRAMUSCULAR | Status: DC

## 2023-04-18 MED ORDER — OXYCODONE HCL 5 MG PO TABS
5.0000 mg | ORAL_TABLET | ORAL | Status: DC | PRN
  Filled 2023-04-18: qty 1

## 2023-04-18 MED ORDER — ZOLPIDEM TARTRATE 5 MG PO TABS
5.0000 mg | ORAL_TABLET | Freq: Every evening | ORAL | Status: DC | PRN
  Administered 2023-04-18 – 2023-04-20 (×2): 5 mg via ORAL
  Filled 2023-04-18 (×2): qty 1

## 2023-04-18 MED ORDER — ACETAMINOPHEN 650 MG RE SUPP: 650.0000 mg | Freq: Four times a day (QID) | RECTAL | Status: DC | PRN

## 2023-04-18 MED ORDER — DEXAMETHASONE SODIUM PHOSPHATE 4 MG/ML IJ SOLN: 4.0000 mg | Freq: Once | INTRAMUSCULAR | Status: DC

## 2023-04-18 MED ORDER — SENNA 8.6 MG PO TABS
1.0000 | ORAL_TABLET | Freq: Two times a day (BID) | ORAL | Status: DC
  Administered 2023-04-18 – 2023-04-20 (×3): 8.6 mg via ORAL
  Filled 2023-04-18 (×4): qty 1

## 2023-04-18 MED ORDER — FENTANYL CITRATE PF 50 MCG/ML IJ SOSY
50.0000 ug | PREFILLED_SYRINGE | Freq: Once | INTRAMUSCULAR | Status: AC
  Administered 2023-04-18: 50 ug via INTRAVENOUS
  Filled 2023-04-18 (×2): qty 1

## 2023-04-18 MED ORDER — VENLAFAXINE HCL ER 150 MG PO CP24
150.0000 mg | ORAL_CAPSULE | Freq: Every day | ORAL | Status: DC
  Administered 2023-04-20: 150 mg via ORAL
  Filled 2023-04-18 (×2): qty 1

## 2023-04-18 MED ORDER — TRANEXAMIC ACID-NACL 1000-0.7 MG/100ML-% IV SOLN
1000.0000 mg | INTRAVENOUS | Status: AC
  Administered 2023-04-18: 1000 mg via INTRAVENOUS
  Filled 2023-04-18: qty 100

## 2023-04-18 MED ORDER — ACETAMINOPHEN 325 MG PO TABS
650.0000 mg | ORAL_TABLET | Freq: Four times a day (QID) | ORAL | Status: DC | PRN
Start: 2023-04-18 — End: 2023-04-19

## 2023-04-18 MED ORDER — PANTOPRAZOLE SODIUM 40 MG PO TBEC
40.0000 mg | DELAYED_RELEASE_TABLET | Freq: Every day | ORAL | Status: DC
  Administered 2023-04-18 – 2023-04-20 (×2): 40 mg via ORAL
  Filled 2023-04-18 (×3): qty 1

## 2023-04-18 MED ORDER — LORAZEPAM 1 MG PO TABS
1.0000 mg | ORAL_TABLET | Freq: Once | ORAL | Status: AC
  Administered 2023-04-18: 1 mg via ORAL
  Filled 2023-04-18: qty 1

## 2023-04-18 MED ORDER — DEXAMETHASONE SODIUM PHOSPHATE 4 MG/ML IJ SOLN
4.0000 mg | Freq: Four times a day (QID) | INTRAMUSCULAR | Status: DC
  Administered 2023-04-19 – 2023-04-20 (×5): 4 mg via INTRAVENOUS
  Filled 2023-04-18 (×5): qty 1

## 2023-04-18 MED ORDER — CEFAZOLIN IN SODIUM CHLORIDE 3-0.9 GM/100ML-% IV SOLN
3.0000 g | INTRAVENOUS | Status: AC
  Administered 2023-04-19: 3 g via INTRAVENOUS
  Filled 2023-04-18 (×2): qty 100

## 2023-04-18 MED ORDER — HYDRALAZINE HCL 20 MG/ML IJ SOLN: 5.0000 mg | INTRAMUSCULAR | Status: DC | PRN

## 2023-04-18 MED ORDER — DEXAMETHASONE SODIUM PHOSPHATE 10 MG/ML IJ SOLN
10.0000 mg | Freq: Once | INTRAMUSCULAR | Status: AC
  Administered 2023-04-18: 10 mg via INTRAVENOUS
  Filled 2023-04-18: qty 1

## 2023-04-18 MED ORDER — DEXAMETHASONE SODIUM PHOSPHATE 4 MG/ML IJ SOLN: 4.0000 mg | Freq: Four times a day (QID) | INTRAMUSCULAR | Status: DC

## 2023-04-18 NOTE — ED Notes (Signed)
Attempted IV access x2, unsuccessful Lab drawn and sent Informed PA

## 2023-04-18 NOTE — Progress Notes (Addendum)
Case d/w Dr. Jake Samples. Pt reports LBP x3 weeks w/ acute increase last night w/ radiating leg pain and some groin numbness. MRI showing large L4-5 disc herniation w/ severe central stenosis. Recommend transfer to [facility], NSGY to follow. NPO at midnight. Tentatively plan for L4-5 laminectomy/microdiscectomy tomorrow.   [place] Delauder Margaree Mackintosh, PA-C

## 2023-04-18 NOTE — ED Triage Notes (Signed)
Back pain and B/L "hamstring" pain ongoing for several weeks. Burning, "electrical pain".   "The most muscle pain I have been in since having a child"  Pt stated she was not able to sleep last night.   Baclofen not working for back pain Zanaflex helped mildly, pain getting worse.  Steroid injection and heating pad worked only on 10/18.

## 2023-04-18 NOTE — H&P (Signed)
TRH H&P   Patient Demographics:    Latoya Mora, is a 27 y.o. female  MRN: [id number]   DOB - [birth year]  Admit Date - 04/18/2023  Outpatient Primary MD for the patient is Patient, No Pcp Per  Referring MD/NP/PA: PA Celeste  Patient coming from: home  Chief Complaint  Patient presents with   Back Pain      HPI:    Latoya Mora  is a 27 y.o. female, with past medical history of anemia, PCOS, history of left salpingo-oophorectomy, she presents to ED secondary to complaints of lower back pain, patient reports symptoms started back in September, with lower back pain, which grist to bilateral lower extremity pain, she was trying to manage with over-the-counter medications and baclofen, she was seen last week in urgent care for worsening lower extremity pain, she got steroid and tizanidine, without much relief, pain unbearable which prompted her to come to ED, she denies any urinary retention, stool incontinence, lower extremity weakness, but she does report numbness and tingling of her vulva and perirectal area. -In ED MRI was significant for large L4-5 disc herniation, with severe central stenosis, neurosurgery at [facility] contacted who recommended admission to hospitalist service and transferred to [facility] in anticipation for L4-5 laminectomy-microdiscectomy tomorrow by Dr. Jake Samples     Review of systems:   A full 10 point Review of Systems was done, except as stated above, all other Review of Systems were negative.   With Past History of the following :    Past Medical History:  Diagnosis Date   Allergy    Anemia    during pregnancies   Anxiety    Depression    Elevated liver enzymes    with both pregnancies, no problems after pregnancies   Frequent headaches    no longer having frequent headaches   History of pre-eclampsia    PCOS (polycystic ovarian syndrome)        Past Surgical History:  Procedure Laterality Date   LAPAROSCOPIC SALPINGO OOPHERECTOMY Left 02/23/2020   Procedure: LAPAROSCOPIC SALPINGO OOPHORECTOMY;  Surgeon: Tereso Newcomer, MD;  Location: [facility] OR;  Service: Gynecology;  Laterality: Left;   TOOTH EXTRACTION     local anesthestic only      Social History:     Social History   Tobacco Use   Smoking status: Never   Smokeless tobacco: Never  Substance Use Topics   Alcohol use: Not Currently      Family History :     Family History  Problem Relation Age of Onset   Depression Mother    Anxiety disorder Mother    Heart disease Mother    Skin cancer Mother    Scoliosis Mother    Alcohol abuse Father    Bipolar disorder Father    Depression Father    Osteochondroma Father    COPD Maternal Grandmother    Scoliosis  Maternal Grandmother    COPD Maternal Grandfather    Skin cancer Maternal Grandfather    Parkinson's disease Maternal Grandfather    Arthritis Paternal Grandmother    Diabetes Paternal Grandmother    Bipolar disorder Paternal Grandfather    Osteochondroma Sister    Osteochondroma Brother    Cancer Neg Hx       Home Medications:   Prior to Admission medications   Medication Sig Start Date End Date Taking? Authorizing Provider  acetaminophen (TYLENOL) 500 MG tablet Take 1,000 mg by mouth every 6 (six) hours as needed (general pain).   Yes [provider]  baclofen (LIORESAL) 10 MG tablet Take 1 tablet (10 mg total) by mouth 3 (three) times daily. Patient taking differently: Take 10 mg by mouth 3 (three) times daily as needed for muscle spasms. 03/12/23  Yes Waldon Merl, PA-C  diphenhydrAMINE-APAP, sleep, (EXCEDRIN PM PO) Take 2 tablets by mouth at bedtime as needed (general pain).   Yes [provider]  tiZANidine (ZANAFLEX) 4 MG capsule Take 1 capsule (4 mg total) by mouth 3 (three) times daily as needed for muscle spasms. Do not drink alcohol or while taking this medication.  May  cause drowsiness. 04/12/23  Yes Particia Nearing, PA-C  venlafaxine XR (EFFEXOR-XR) 150 MG 24 hr capsule Take 1 capsule (150 mg total) by mouth daily with breakfast. 11/21/22  Yes Anyanwu, Jethro Bastos, MD     Allergies:     Allergies  Allergen Reactions   Etodolac Nausea Only   Meloxicam Other (See Comments)    Abdominal pain   Flexeril [Cyclobenzaprine] Nausea Only   Nsaids Other (See Comments)    Stomach pain. Pt ok with Motrin   Prozac [Fluoxetine Hcl] Other (See Comments)    Felt bad/awful   Hydroxyzine Nausea Only     Physical Exam:   Vitals  Blood pressure 135/79, pulse 95, temperature 98.3 F (36.8 C), resp. rate 17, height 5\' 8"  (1.727 m), weight (!) 153.2 kg, last menstrual period 03/21/2023, SpO2 99%, unknown if currently breastfeeding.   1. General, well Developed female, with morbid obesity, laying in bed, NAD  2. Normal affect and insight, Not Suicidal or Homicidal, Awake Alert, Oriented X 3.  3. No F.N deficits, ALL C.Nerves Intact, Strength 5/5 all 4 extremities, Sensation intact all 4 extremities, Plantars down going.  4. Ears and Eyes appear Normal, Conjunctivae clear, PERRLA. Moist Oral Mucosa.  5. Supple Neck, No JVD, No cervical lymphadenopathy appriciated, No Carotid Bruits.  6. Symmetrical Chest wall movement, Good air movement bilaterally, CTAB.  7. RRR, No Gallops, Rubs or Murmurs, No Parasternal Heave.  8. Positive Bowel Sounds, Abdomen Soft, No tenderness, No organomegaly appriciated,No rebound -guarding or rigidity.  9.  No Cyanosis, Normal Skin Turgor, No Skin Rash or Bruise.  10. Good muscle tone,  joints appear normal , no effusions, Normal ROM.  No motor deficits, but lower extremity motor strength limited due to pain    CBC Recent Labs  Lab 04/18/23 1459  WBC 10.7*  HGB 10.4*  HCT 37.7  PLT 285  MCV 69.7*  MCH 19.2*  MCHC 27.6*  RDW 19.0*  LYMPHSABS 3.2  MONOABS 0.6  EOSABS 0.2  BASOSABS 0.0    ------------------------------------------------------------------------------------------------------------------  Chemistries  Recent Labs  Lab 04/18/23 1459  NA 137  K 3.6  CL 104  CO2 22  GLUCOSE 81  BUN 11  CREATININE 0.50  CALCIUM 8.8*   ------------------------------------------------------------------------------------------------------------------ estimated creatinine clearance is 163.1 mL/min (by  C-G formula based on SCr of 0.5 mg/dL). ------------------------------------------------------------------------------------------------------------------ No results for input(s): "TSH", "T4TOTAL", "T3FREE", "THYROIDAB" in the last 72 hours.  Invalid input(s): "FREET3"  Coagulation profile No results for input(s): "INR", "PROTIME" in the last 168 hours. ------------------------------------------------------------------------------------------------------------------- No results for input(s): "DDIMER" in the last 72 hours. -------------------------------------------------------------------------------------------------------------------  Cardiac Enzymes No results for input(s): "CKMB", "TROPONINI", "MYOGLOBIN" in the last 168 hours.  Invalid input(s): "CK" ------------------------------------------------------------------------------------------------------------------ No results found for: "BNP"   ---------------------------------------------------------------------------------------------------------------  Urinalysis    Component Value Date/Time   COLORURINE YELLOW 04/18/2023 1400   APPEARANCEUR CLEAR 04/18/2023 1400   APPEARANCEUR Clear 11/04/2017 1135   LABSPEC 1.023 04/18/2023 1400   PHURINE 6.0 04/18/2023 1400   GLUCOSEU NEGATIVE 04/18/2023 1400   HGBUR NEGATIVE 04/18/2023 1400   BILIRUBINUR NEGATIVE 04/18/2023 1400   BILIRUBINUR Negative 11/04/2017 1135   KETONESUR NEGATIVE 04/18/2023 1400   PROTEINUR NEGATIVE 04/18/2023 1400   UROBILINOGEN 0.2  09/30/2017 1424   NITRITE NEGATIVE 04/18/2023 1400   LEUKOCYTESUR TRACE (A) 04/18/2023 1400    ----------------------------------------------------------------------------------------------------------------   Imaging Results:    MR LUMBAR SPINE WO CONTRAST  Result Date: 04/18/2023 CLINICAL DATA:  Low back pain, cauda equina syndrome suspected EXAM: MRI LUMBAR SPINE WITHOUT CONTRAST TECHNIQUE: Multiplanar, multisequence MR imaging of the lumbar spine was performed. No intravenous contrast was administered. COMPARISON:  None Available. FINDINGS: Segmentation: 5 lumbar type vertebral bodies. On the 06/04/2022 abdomen pelvis CT, hypoplastic ribs are noted at T12. Alignment:  Dextrocurvature.  No significant listhesis. Vertebrae: No acute fracture, evidence of discitis, or suspicious osseous lesion. Conus medullaris and cauda equina: Conus extends to the L2 level. Cauda equina redundancy secondary to a large disc protrusion at L4-L5. Conus and cauda equina appear otherwise normal. Paraspinal and other soft tissues: Negative. Disc levels: T12-L1: Seen only on the sagittal images. No significant disc bulge, spinal canal stenosis, or neural foraminal narrowing. L1-L2: No significant disc bulge. No spinal canal stenosis or neural foraminal narrowing. L2-L3: No significant disc bulge. No spinal canal stenosis or neural foraminal narrowing. L3-L4: Central disc protrusion with annular fissure. Mild right facet arthropathy. No spinal canal stenosis or neural foraminal narrowing. L4-L5: Large central/left paracentral disc protrusion, which measures up to 12 mm in AP dimension (series 5, image 9 and series 8, image 24). This effaces both lateral recesses and causes severe spinal canal stenosis, with compression of the cauda equina. Mild right facet arthropathy. No neural foraminal narrowing. L5-S1: No significant disc bulge. No spinal canal stenosis or neural foraminal narrowing. IMPRESSION: L4-L5 large central/left  paracentral disc protrusion, which causes severe spinal canal stenosis and compression of the cauda equina. Electronically Signed   By: Wiliam Ke M.D.   On: 04/18/2023 17:48       Assessment & Plan:    Principal Problem:   Cauda equina compression [facility]) Active Problems:   Morbid obesity (HCC)   L4-5 large disc herniation with severe central stenosis-cauda equina compression -Patient's with no focal deficits or weakness -Neurosurgery input greatly appreciated, plan for L4-5 laminectomy/microdiscectomy tomorrow by [facility]  -continue with as needed morphine and oxycodone for pain control  - keep n.p.o. after midnight keep on IV steroids, on PPI for GI prophylaxis -SCD for DVT prophylaxis  -PT/OT postoperatively  Morbid obesity Body mass index is 51.36 kg/m.   DVT Prophylaxis SCDs   AM Labs Ordered, also please review Full Orders  Family Communication: Admission, patients condition and plan of care including tests being ordered have been discussed with the patient  who indicate  understanding and agree with the plan and Code Status.  Code Status full  Likely DC to  home  Consults called: neurosurgery    Admission status: inpatinet    Time spent in minutes : 50 minutes   Huey Bienenstock M.D on 04/18/2023 at 8:43 PM   [facility] - Office  [phone]

## 2023-04-18 NOTE — ED Provider Notes (Addendum)
[place] EMERGENCY DEPARTMENT AT [facility] Provider Note   CSN: [id number] Arrival date & time: 04/18/23  1138     History  Chief Complaint  Patient presents with   Back Pain    Latoya Mora is a 27 y.o. female.  She has PMH of anemia, PCOS, history of history of left salpingo-oophorectomy. Patient presents to ER for low back pain. She has been been having about 2 months of low back pain not relieved with OTC meds and baclofen.  She was seen last week in urgent care due to worsening pain and got steroids and tizanidine without relief.  Pain became unbearable last night.  She been having bilateral sciatica symptoms and last night started having numbness and tingling of her vulva and perirectal area.   Back Pain      Home Medications Prior to Admission medications   Medication Sig Start Date End Date Taking? Authorizing Provider  baclofen (LIORESAL) 10 MG tablet Take 1 tablet (10 mg total) by mouth 3 (three) times daily. 03/12/23   Waldon Merl, PA-C  tiZANidine (ZANAFLEX) 4 MG capsule Take 1 capsule (4 mg total) by mouth 3 (three) times daily as needed for muscle spasms. Do not drink alcohol or while taking this medication.  May cause drowsiness. 04/12/23   Particia Nearing, PA-C  venlafaxine XR (EFFEXOR-XR) 150 MG 24 hr capsule Take 1 capsule (150 mg total) by mouth daily with breakfast. 11/21/22   Anyanwu, Jethro Bastos, MD      Allergies    Etodolac, Meloxicam, Flexeril [cyclobenzaprine], Nsaids, Prozac [fluoxetine hcl], and Hydroxyzine    Review of Systems   Review of Systems  Musculoskeletal:  Positive for back pain.    Physical Exam Updated Vital Signs BP (!) 140/88   Pulse (!) 106   Temp 98 F (36.7 C)   Resp 20   Ht 5\' 8"  (1.727 m)   Wt (!) 153.2 kg   LMP 03/21/2023 (Approximate)   SpO2 100%   BMI 51.36 kg/m  Physical Exam Vitals and nursing note reviewed.  Constitutional:      General: She is not in acute distress.    Appearance: She  is well-developed.  HENT:     Head: Normocephalic and atraumatic.     Mouth/Throat:     Mouth: Mucous membranes are moist.  Eyes:     Conjunctiva/sclera: Conjunctivae normal.  Cardiovascular:     Rate and Rhythm: Normal rate and regular rhythm.     Heart sounds: No murmur heard. Pulmonary:     Effort: Pulmonary effort is normal. No respiratory distress.     Breath sounds: Normal breath sounds.  Abdominal:     Palpations: Abdomen is soft.     Tenderness: There is no abdominal tenderness.  Musculoskeletal:        General: No swelling.     Cervical back: Neck supple.     Comments: Tenderness over very lower lumbar area with no overlying swelling or skin changes.  Skin:    General: Skin is warm and dry.     Capillary Refill: Capillary refill takes less than 2 seconds.  Neurological:     Mental Status: She is alert and oriented to person, place, and time.     Motor: Motor function is intact.     Deep Tendon Reflexes:     Reflex Scores:      Patellar reflexes are 4+ on the right side and 4+ on the left side.    Comments: Normal  strength in bilateral upper and lower extremities Patient able to dorsiflex and plantarflex ankles against resistance without difficulty, decreased sensation in saddle area but normal sensation in bilateral thighs and lower legs.  Psychiatric:        Mood and Affect: Mood normal.     ED Results / Procedures / Treatments   Labs (all labs ordered are listed, but only abnormal results are displayed) Labs Reviewed  BASIC METABOLIC PANEL - Abnormal; Notable for the following components:      Result Value   Calcium 8.8 (*)    All other components within normal limits  CBC WITH DIFFERENTIAL/PLATELET - Abnormal; Notable for the following components:   WBC 10.7 (*)    RBC 5.41 (*)    Hemoglobin 10.4 (*)    MCV 69.7 (*)    MCH 19.2 (*)    MCHC 27.6 (*)    RDW 19.0 (*)    All other components within normal limits  URINALYSIS, ROUTINE W REFLEX MICROSCOPIC -  Abnormal; Notable for the following components:   Leukocytes,Ua TRACE (*)    All other components within normal limits  PREGNANCY, URINE    EKG None  Radiology MR LUMBAR SPINE WO CONTRAST  Result Date: 04/18/2023 CLINICAL DATA:  Low back pain, cauda equina syndrome suspected EXAM: MRI LUMBAR SPINE WITHOUT CONTRAST TECHNIQUE: Multiplanar, multisequence MR imaging of the lumbar spine was performed. No intravenous contrast was administered. COMPARISON:  None Available. FINDINGS: Segmentation: 5 lumbar type vertebral bodies. On the 06/04/2022 abdomen pelvis CT, hypoplastic ribs are noted at T12. Alignment:  Dextrocurvature.  No significant listhesis. Vertebrae: No acute fracture, evidence of discitis, or suspicious osseous lesion. Conus medullaris and cauda equina: Conus extends to the L2 level. Cauda equina redundancy secondary to a large disc protrusion at L4-L5. Conus and cauda equina appear otherwise normal. Paraspinal and other soft tissues: Negative. Disc levels: T12-L1: Seen only on the sagittal images. No significant disc bulge, spinal canal stenosis, or neural foraminal narrowing. L1-L2: No significant disc bulge. No spinal canal stenosis or neural foraminal narrowing. L2-L3: No significant disc bulge. No spinal canal stenosis or neural foraminal narrowing. L3-L4: Central disc protrusion with annular fissure. Mild right facet arthropathy. No spinal canal stenosis or neural foraminal narrowing. L4-L5: Large central/left paracentral disc protrusion, which measures up to 12 mm in AP dimension (series 5, image 9 and series 8, image 24). This effaces both lateral recesses and causes severe spinal canal stenosis, with compression of the cauda equina. Mild right facet arthropathy. No neural foraminal narrowing. L5-S1: No significant disc bulge. No spinal canal stenosis or neural foraminal narrowing. IMPRESSION: L4-L5 large central/left paracentral disc protrusion, which causes severe spinal canal stenosis  and compression of the cauda equina. Electronically Signed   By: Wiliam Ke M.D.   On: 04/18/2023 17:48    Procedures Ultrasound ED Peripheral IV (Provider)  Date/Time: 04/18/2023 7:17 PM  Performed by: Ma Rings, PA-C Authorized by: Ma Rings, PA-C   Procedure details:    Indications: multiple failed IV attempts     Skin Prep: chlorhexidine gluconate     Location: Left medial upper arm.   Angiocath:  20 G   Bedside Ultrasound Guided: Yes     Images: not archived     Patient tolerated procedure without complications: Yes     Dressing applied: Yes       Medications Ordered in ED Medications  pantoprazole (PROTONIX) EC tablet 40 mg (has no administration in time range)  dexamethasone (DECADRON)  injection 10 mg (has no administration in time range)  fentaNYL (SUBLIMAZE) injection 50 mcg (50 mcg Intravenous Given 04/18/23 1801)  LORazepam (ATIVAN) tablet 1 mg (1 mg Oral Given 04/18/23 1445)    ED Course/ Medical Decision Making/ A&P Clinical Course as of 04/18/23 1918  Thu Apr 18, 2023  1348 Is been having about 2 months of low back pain not relieved with OTC meds and baclofen.  Was seen last week in urgent care due to worsening pain and got steroids and tizanidine without relief.  Pain became unbearable last night.  She been having bilateral sciatica symptoms and last night started having numbness and tingling of her vulva and perirectal area.  She denies loss of bowel or bladder function, denies urinary retention.  No fevers chills or weight loss.  I discussed with patient her symptoms are concerning for possible cauda equina she is agreeable with MRI. [CB]    Clinical Course User Index [CB] Ma Rings, PA-C                                 Medical Decision Making Differential diagnosis: HNP, cauda equina, epidural abscess, epidural hematoma, muscle strain, contusion, other ED course: Patient presents with severe low back pain that has been gradually  worsening over the past couple of months and became much more severe last night and patient developed genital and perirectal anesthesia.  She has been having a couple of weeks of bilateral sciatica symptoms.  No trauma to her back.  MRI ordered due to concern for cauda equina syndrome.  She reports she is able to empty her bladder, no urinary incontinence, no bowel incontinence.  No fevers or chills, she does have some hyperreflexia in her patellar tendons on exam, strength is intact bilaterally in her lower extremities.  MRI ordered, I independently reviewed and interpreted images which showed a large disc bulge at L4-L5 and severe canal stenosis.  I called radiology to get urgent read, radiology read showed IMPRESSION: L4-L5 large central/left paracentral disc protrusion, which causes severe spinal canal stenosis and compression of the cauda equina.  I informed patient of findings, I consulted with neurosurgery, I spoke with Shiela Mayer who consulted with her attending as well Dr. Jake Samples.  They request that patient be admitted to [facility] to hospitalist service.  They also suggested 4 mg of IV dexamethasone.  They stated they will see her in the morning and plan for discectomy tomorrow.   I consulted with the hospitalist and spoke with Dr. Randol Kern who is willing to admit to [facility]  Patient was given fentanyl for pain and had improvement of pain to a 2 out of 10 the pain scale   Amount and/or Complexity of Data Reviewed Labs: ordered. Radiology: ordered.  Risk Prescription drug management. Decision regarding hospitalization.           Final Clinical Impression(s) / ED Diagnoses Final diagnoses:  Cauda equina compression [facility])    Rx / DC Orders ED Discharge Orders     None         Ma Rings, PA-C 04/18/23 1917    Josem Kaufmann 04/18/23 1918    Terrilee Files, MD 04/19/23 [phone]

## 2023-04-19 ENCOUNTER — Other Ambulatory Visit: Payer: Self-pay

## 2023-04-19 ENCOUNTER — Encounter (HOSPITAL_COMMUNITY): Payer: Self-pay | Admitting: Internal Medicine

## 2023-04-19 ENCOUNTER — Inpatient Hospital Stay (HOSPITAL_COMMUNITY): Payer: Self-pay

## 2023-04-19 ENCOUNTER — Encounter (HOSPITAL_COMMUNITY)
Admission: EM | Disposition: A | Discharge status: Home / Self Care | Payer: Self-pay | Source: Home / Self Care | Attending: Internal Medicine

## 2023-04-19 DIAGNOSIS — M48061 Spinal stenosis, lumbar region without neurogenic claudication: Secondary | ICD-10-CM

## 2023-04-19 HISTORY — PX: LUMBAR LAMINECTOMY/DECOMPRESSION MICRODISCECTOMY: SHX5026

## 2023-04-19 LAB — CBC
HCT: 34.2 % — ABNORMAL LOW (ref 36.0–46.0)
Hemoglobin: 9.9 g/dL — ABNORMAL LOW (ref 12.0–15.0)
MCH: 19.3 pg — ABNORMAL LOW (ref 26.0–34.0)
MCHC: 28.9 g/dL — ABNORMAL LOW (ref 30.0–36.0)
MCV: 66.8 fL — ABNORMAL LOW (ref 80.0–100.0)
Platelets: 288 10*3/uL (ref 150–400)
RBC: 5.12 MIL/uL — ABNORMAL HIGH (ref 3.87–5.11)
RDW: 18.4 % — ABNORMAL HIGH (ref 11.5–15.5)
WBC: 7.3 10*3/uL (ref 4.0–10.5)
nRBC: 0 % (ref 0.0–0.2)

## 2023-04-19 LAB — BASIC METABOLIC PANEL
Anion gap: 9 (ref 5–15)
BUN: 7 mg/dL (ref 6–20)
CO2: 18 mmol/L — ABNORMAL LOW (ref 22–32)
Calcium: 8.6 mg/dL — ABNORMAL LOW (ref 8.9–10.3)
Chloride: 108 mmol/L (ref 98–111)
Creatinine, Ser: 0.45 mg/dL (ref 0.44–1.00)
GFR, Estimated: >60 mL/min (ref >60)
Glucose, Bld: 134 mg/dL — ABNORMAL HIGH (ref 70–99)
Potassium: 4 mmol/L (ref 3.5–5.1)
Sodium: 135 mmol/L (ref 135–145)

## 2023-04-19 LAB — SURGICAL PCR SCREEN
MRSA, PCR: NEGATIVE
Staphylococcus aureus: NEGATIVE

## 2023-04-19 LAB — HIV ANTIBODY (ROUTINE TESTING W REFLEX): HIV Screen 4th Generation wRfx: NONREACTIVE

## 2023-04-19 SURGERY — LUMBAR LAMINECTOMY/DECOMPRESSION MICRODISCECTOMY 1 LEVEL
Anesthesia: General | Site: Back

## 2023-04-19 MED ORDER — LACTATED RINGERS IV SOLN: INTRAVENOUS | Status: DC | PRN

## 2023-04-19 MED ORDER — HYDROMORPHONE HCL 1 MG/ML IJ SOLN
INTRAMUSCULAR | Status: AC
  Filled 2023-04-19: qty 1

## 2023-04-19 MED ORDER — LIDOCAINE 2% (20 MG/ML) 5 ML SYRINGE
INTRAMUSCULAR | Status: DC | PRN
  Administered 2023-04-19: 100 mg via INTRAVENOUS

## 2023-04-19 MED ORDER — DEXMEDETOMIDINE HCL IN NACL 80 MCG/20ML IV SOLN
INTRAVENOUS | Status: DC | PRN
  Administered 2023-04-19: 16 ug via INTRAVENOUS
  Administered 2023-04-19: 4 ug via INTRAVENOUS

## 2023-04-19 MED ORDER — LIDOCAINE-EPINEPHRINE 1 %-1:100000 IJ SOLN
INTRAMUSCULAR | Status: AC
  Filled 2023-04-19: qty 1

## 2023-04-19 MED ORDER — METHOCARBAMOL 500 MG PO TABS: 500.0000 mg | ORAL_TABLET | Freq: Four times a day (QID) | ORAL | Status: DC | PRN

## 2023-04-19 MED ORDER — CHLORHEXIDINE GLUCONATE 0.12 % MT SOLN
OROMUCOSAL | Status: AC
  Administered 2023-04-19: 15 mL via OROMUCOSAL
  Filled 2023-04-19: qty 15

## 2023-04-19 MED ORDER — SODIUM CHLORIDE 0.9% FLUSH
3.0000 mL | Freq: Two times a day (BID) | INTRAVENOUS | Status: DC
  Administered 2023-04-20: 3 mL via INTRAVENOUS

## 2023-04-19 MED ORDER — ACETAMINOPHEN 325 MG PO TABS
650.0000 mg | ORAL_TABLET | ORAL | Status: DC | PRN
Start: 2023-04-19 — End: 2023-04-20

## 2023-04-19 MED ORDER — OXYCODONE HCL 5 MG PO TABS: 10.0000 mg | ORAL_TABLET | ORAL | Status: DC | PRN

## 2023-04-19 MED ORDER — BUPIVACAINE-EPINEPHRINE (PF) 0.5% -1:200000 IJ SOLN
INTRAMUSCULAR | Status: DC | PRN
  Administered 2023-04-19: 5 mL

## 2023-04-19 MED ORDER — FENTANYL CITRATE (PF) 100 MCG/2ML IJ SOLN
INTRAMUSCULAR | Status: AC
  Filled 2023-04-19: qty 2

## 2023-04-19 MED ORDER — PHENOL 1.4 % MT LIQD: 1.0000 | OROMUCOSAL | Status: DC | PRN

## 2023-04-19 MED ORDER — MIDAZOLAM HCL 2 MG/2ML IJ SOLN
INTRAMUSCULAR | Status: AC
  Filled 2023-04-19: qty 2

## 2023-04-19 MED ORDER — ONDANSETRON HCL 4 MG PO TABS: 4.0000 mg | ORAL_TABLET | Freq: Four times a day (QID) | ORAL | Status: DC | PRN

## 2023-04-19 MED ORDER — MEPERIDINE HCL 25 MG/ML IJ SOLN: 6.2500 mg | INTRAMUSCULAR | Status: DC | PRN

## 2023-04-19 MED ORDER — PROPOFOL 10 MG/ML IV BOLUS
INTRAVENOUS | Status: AC
  Filled 2023-04-19: qty 20

## 2023-04-19 MED ORDER — THROMBIN 5000 UNITS EX SOLR: OROMUCOSAL | Status: DC | PRN

## 2023-04-19 MED ORDER — BUPIVACAINE LIPOSOME 1.3 % IJ SUSP
INTRAMUSCULAR | Status: AC
  Filled 2023-04-19: qty 20

## 2023-04-19 MED ORDER — PROPOFOL 10 MG/ML IV BOLUS
INTRAVENOUS | Status: DC | PRN
  Administered 2023-04-19 (×2): 200 mg via INTRAVENOUS

## 2023-04-19 MED ORDER — SODIUM CHLORIDE 0.9% FLUSH: 3.0000 mL | INTRAVENOUS | Status: DC | PRN

## 2023-04-19 MED ORDER — DEXAMETHASONE SODIUM PHOSPHATE 10 MG/ML IJ SOLN
INTRAMUSCULAR | Status: AC
  Filled 2023-04-19: qty 3

## 2023-04-19 MED ORDER — HYDROMORPHONE HCL 1 MG/ML IJ SOLN
0.2500 mg | INTRAMUSCULAR | Status: DC | PRN
  Administered 2023-04-19 (×2): 0.5 mg via INTRAVENOUS

## 2023-04-19 MED ORDER — ACETAMINOPHEN 650 MG RE SUPP
650.0000 mg | RECTAL | Status: DC | PRN
Start: 2023-04-19 — End: 2023-04-20

## 2023-04-19 MED ORDER — ROCURONIUM BROMIDE 10 MG/ML (PF) SYRINGE
PREFILLED_SYRINGE | INTRAVENOUS | Status: DC | PRN
  Administered 2023-04-19: 50 mg via INTRAVENOUS
  Administered 2023-04-19: 70 mg via INTRAVENOUS

## 2023-04-19 MED ORDER — ROCURONIUM BROMIDE 10 MG/ML (PF) SYRINGE
PREFILLED_SYRINGE | INTRAVENOUS | Status: AC
Start: 2023-04-19 — End: ?
  Filled 2023-04-19: qty 40

## 2023-04-19 MED ORDER — ONDANSETRON HCL 4 MG/2ML IJ SOLN: 4.0000 mg | Freq: Four times a day (QID) | INTRAMUSCULAR | Status: DC | PRN

## 2023-04-19 MED ORDER — HEMOSTATIC AGENTS (NO CHARGE) OPTIME
TOPICAL | Status: DC | PRN
  Administered 2023-04-19: 1 via TOPICAL

## 2023-04-19 MED ORDER — KETOROLAC TROMETHAMINE 15 MG/ML IJ SOLN
15.0000 mg | Freq: Four times a day (QID) | INTRAMUSCULAR | Status: DC
  Administered 2023-04-20 (×2): 15 mg via INTRAVENOUS
  Filled 2023-04-19 (×2): qty 1

## 2023-04-19 MED ORDER — 0.9 % SODIUM CHLORIDE (POUR BTL) OPTIME
TOPICAL | Status: DC | PRN
  Administered 2023-04-19: 1000 mL

## 2023-04-19 MED ORDER — ACETAMINOPHEN 325 MG PO TABS: 650.0000 mg | ORAL_TABLET | Freq: Four times a day (QID) | ORAL | Status: DC | PRN

## 2023-04-19 MED ORDER — FENTANYL CITRATE (PF) 250 MCG/5ML IJ SOLN
INTRAMUSCULAR | Status: DC | PRN
  Administered 2023-04-19 (×3): 50 ug via INTRAVENOUS
  Administered 2023-04-19: 100 ug via INTRAVENOUS

## 2023-04-19 MED ORDER — AMISULPRIDE (ANTIEMETIC) 5 MG/2ML IV SOLN
INTRAVENOUS | Status: AC
  Filled 2023-04-19: qty 4

## 2023-04-19 MED ORDER — CHLORHEXIDINE GLUCONATE 0.12 % MT SOLN: 15.0000 mL | Freq: Once | OROMUCOSAL | Status: AC

## 2023-04-19 MED ORDER — CEFAZOLIN SODIUM-DEXTROSE 2-4 GM/100ML-% IV SOLN
2.0000 g | Freq: Three times a day (TID) | INTRAVENOUS | Status: AC
  Administered 2023-04-20 (×2): 2 g via INTRAVENOUS
  Filled 2023-04-19 (×2): qty 100

## 2023-04-19 MED ORDER — LIDOCAINE-EPINEPHRINE 1 %-1:100000 IJ SOLN
INTRAMUSCULAR | Status: DC | PRN
  Administered 2023-04-19: 5 mL via INTRADERMAL

## 2023-04-19 MED ORDER — ORAL CARE MOUTH RINSE: 15.0000 mL | Freq: Once | OROMUCOSAL | Status: AC

## 2023-04-19 MED ORDER — DEXAMETHASONE SODIUM PHOSPHATE 10 MG/ML IJ SOLN
INTRAMUSCULAR | Status: DC | PRN
  Administered 2023-04-19: 5 mg via INTRAVENOUS

## 2023-04-19 MED ORDER — MENTHOL 3 MG MT LOZG: 1.0000 | LOZENGE | OROMUCOSAL | Status: DC | PRN

## 2023-04-19 MED ORDER — FLEET ENEMA RE ENEM: 1.0000 | ENEMA | Freq: Once | RECTAL | Status: DC | PRN

## 2023-04-19 MED ORDER — METHYLPREDNISOLONE ACETATE 80 MG/ML IJ SUSP
INTRAMUSCULAR | Status: AC
  Filled 2023-04-19: qty 1

## 2023-04-19 MED ORDER — BUPIVACAINE-EPINEPHRINE (PF) 0.5% -1:200000 IJ SOLN
INTRAMUSCULAR | Status: AC
  Filled 2023-04-19: qty 30

## 2023-04-19 MED ORDER — SUGAMMADEX SODIUM 200 MG/2ML IV SOLN
INTRAVENOUS | Status: DC | PRN
  Administered 2023-04-19: 200 mg via INTRAVENOUS

## 2023-04-19 MED ORDER — DOCUSATE SODIUM 100 MG PO CAPS
100.0000 mg | ORAL_CAPSULE | Freq: Two times a day (BID) | ORAL | Status: DC
  Administered 2023-04-20 (×2): 100 mg via ORAL
  Filled 2023-04-19 (×2): qty 1

## 2023-04-19 MED ORDER — THROMBIN 5000 UNITS EX SOLR
CUTANEOUS | Status: AC
  Filled 2023-04-19: qty 5000

## 2023-04-19 MED ORDER — METHOCARBAMOL 1000 MG/10ML IJ SOLN: 500.0000 mg | Freq: Four times a day (QID) | INTRAMUSCULAR | Status: DC | PRN

## 2023-04-19 MED ORDER — ONDANSETRON HCL 4 MG/2ML IJ SOLN
INTRAMUSCULAR | Status: AC
  Filled 2023-04-19: qty 6

## 2023-04-19 MED ORDER — FENTANYL CITRATE (PF) 250 MCG/5ML IJ SOLN
INTRAMUSCULAR | Status: AC
  Filled 2023-04-19: qty 5

## 2023-04-19 MED ORDER — AMISULPRIDE (ANTIEMETIC) 5 MG/2ML IV SOLN
10.0000 mg | Freq: Once | INTRAVENOUS | Status: AC | PRN
  Administered 2023-04-19: 10 mg via INTRAVENOUS

## 2023-04-19 MED ORDER — ALBUTEROL SULFATE HFA 108 (90 BASE) MCG/ACT IN AERS
INHALATION_SPRAY | RESPIRATORY_TRACT | Status: AC
  Filled 2023-04-19: qty 6.7

## 2023-04-19 MED ORDER — MIDAZOLAM HCL 2 MG/2ML IJ SOLN
INTRAMUSCULAR | Status: DC | PRN
  Administered 2023-04-19: 2 mg via INTRAVENOUS

## 2023-04-19 MED ORDER — ONDANSETRON HCL 4 MG/2ML IJ SOLN
INTRAMUSCULAR | Status: DC | PRN
  Administered 2023-04-19: 4 mg via INTRAVENOUS

## 2023-04-19 SURGICAL SUPPLY — 61 items
ADH SKN CLS LQ APL DERMABOND (GAUZE/BANDAGES/DRESSINGS) ×1
BAG COUNTER SPONGE SURGICOUNT (BAG) ×1 IMPLANT
BAG SPNG CNTER NS LX DISP (BAG) ×1
BUR CARBIDE MATCH 3.0 (BURR) ×1 IMPLANT
CNTNR URN SCR LID CUP LEK RST (MISCELLANEOUS) ×1 IMPLANT
CONT SPEC 4OZ STRL OR WHT (MISCELLANEOUS) ×1
COVER MAYO STAND STRL (DRAPES) ×1 IMPLANT
DERMABOND ADVANCED .7 DNX6 (GAUZE/BANDAGES/DRESSINGS) IMPLANT
DRAIN JACKSON RD 7FR 3/32 (WOUND CARE) IMPLANT
DRAPE C-ARM 42X72 X-RAY (DRAPES) ×1 IMPLANT
DRAPE LAPAROTOMY 100X72X124 (DRAPES) ×1 IMPLANT
DRAPE MICROSCOPE SLANT 54X150 (MISCELLANEOUS) ×1 IMPLANT
DRAPE SURG 17X23 STRL (DRAPES) ×1 IMPLANT
DRSG OPSITE POSTOP 4X6 (GAUZE/BANDAGES/DRESSINGS) IMPLANT
DURAPREP 26ML APPLICATOR (WOUND CARE) ×1 IMPLANT
ELECT BLADE INSULATED 4IN (ELECTROSURGICAL) ×1
ELECT BLADE INSULATED 6.5IN (ELECTROSURGICAL) ×1
ELECT COATED BLADE 2.86 ST (ELECTRODE) ×1 IMPLANT
ELECT REM PT RETURN 9FT ADLT (ELECTROSURGICAL) ×1
ELECTRODE BLADE INSULATED 4IN (ELECTROSURGICAL) ×1 IMPLANT
ELECTRODE BLDE INSULATED 6.5IN (ELECTROSURGICAL) IMPLANT
ELECTRODE REM PT RTRN 9FT ADLT (ELECTROSURGICAL) ×1 IMPLANT
EVACUATOR 1/8 PVC DRAIN (DRAIN) IMPLANT
GAUZE 4X4 16PLY ~~LOC~~+RFID DBL (SPONGE) IMPLANT
GAUZE SPONGE 4X4 12PLY STRL (GAUZE/BANDAGES/DRESSINGS) ×1 IMPLANT
GLOVE BIO SURGEON STRL SZ7 (GLOVE) ×1 IMPLANT
GLOVE BIOGEL PI IND STRL 7.5 (GLOVE) ×1 IMPLANT
GLOVE BIOGEL PI IND STRL 8 (GLOVE) ×1 IMPLANT
GLOVE ECLIPSE 8.0 STRL XLNG CF (GLOVE) ×2 IMPLANT
GOWN STRL REUS W/ TWL LRG LVL3 (GOWN DISPOSABLE) IMPLANT
GOWN STRL REUS W/ TWL XL LVL3 (GOWN DISPOSABLE) ×2 IMPLANT
GOWN STRL REUS W/TWL 2XL LVL3 (GOWN DISPOSABLE) IMPLANT
GOWN STRL REUS W/TWL LRG LVL3 (GOWN DISPOSABLE)
GOWN STRL REUS W/TWL XL LVL3 (GOWN DISPOSABLE) ×2
HEMOSTAT POWDER KIT SURGIFOAM (HEMOSTASIS) ×1 IMPLANT
KIT BASIN OR (CUSTOM PROCEDURE TRAY) ×1 IMPLANT
KIT POSITION SURG JACKSON T1 (MISCELLANEOUS) ×1 IMPLANT
KIT TURNOVER KIT B (KITS) ×1 IMPLANT
MARKER SKIN DUAL TIP RULER LAB (MISCELLANEOUS) ×1 IMPLANT
NDL HYPO 25X1 1.5 SAFETY (NEEDLE) ×1 IMPLANT
NDL SPNL 18GX3.5 QUINCKE PK (NEEDLE) IMPLANT
NEEDLE HYPO 25X1 1.5 SAFETY (NEEDLE) ×1 IMPLANT
NEEDLE SPNL 18GX3.5 QUINCKE PK (NEEDLE) ×1 IMPLANT
NS IRRIG 1000ML POUR BTL (IV SOLUTION) ×1 IMPLANT
PACK LAMINECTOMY NEURO (CUSTOM PROCEDURE TRAY) ×1 IMPLANT
PAD ARMBOARD 7.5X6 YLW CONV (MISCELLANEOUS) ×3 IMPLANT
PATTIES SURGICAL .5 X.5 (GAUZE/BANDAGES/DRESSINGS) IMPLANT
PATTIES SURGICAL .5 X1 (DISPOSABLE) IMPLANT
PATTIES SURGICAL 1X1 (DISPOSABLE) IMPLANT
SPIKE FLUID TRANSFER (MISCELLANEOUS) ×1 IMPLANT
SPONGE SURGIFOAM ABS GEL 100 (HEMOSTASIS) IMPLANT
SPONGE SURGIFOAM ABS GEL SZ50 (HEMOSTASIS) ×1 IMPLANT
SPONGE T-LAP 4X18 ~~LOC~~+RFID (SPONGE) IMPLANT
STAPLER VISISTAT 35W (STAPLE) IMPLANT
SUT VIC AB 0 CT1 18XCR BRD8 (SUTURE) ×1 IMPLANT
SUT VIC AB 0 CT1 8-18 (SUTURE) ×1
SUT VIC AB 2-0 CP2 18 (SUTURE) ×1 IMPLANT
SUT VIC AB 3-0 SH 8-18 (SUTURE) ×1 IMPLANT
TOWEL GREEN STERILE (TOWEL DISPOSABLE) ×1 IMPLANT
TOWEL GREEN STERILE FF (TOWEL DISPOSABLE) ×1 IMPLANT
WATER STERILE IRR 1000ML POUR (IV SOLUTION) ×1 IMPLANT

## 2023-04-19 NOTE — Anesthesia Procedure Notes (Signed)
Procedure Name: Intubation Date/Time: 04/19/2023 6:15 PM  Performed by: Sandie Ano, CRNAPre-anesthesia Checklist: Patient identified, Emergency Drugs available, Suction available and Patient being monitored Patient Re-evaluated:Patient Re-evaluated prior to induction Oxygen Delivery Method: Circle System Utilized Preoxygenation: Pre-oxygenation with 100% oxygen Induction Type: IV induction Ventilation: Mask ventilation without difficulty Laryngoscope Size: Mac and 3 Grade View: Grade I Tube type: Oral Tube size: 7.0 mm Number of attempts: 1 Airway Equipment and Method: Stylet and Oral airway Placement Confirmation: ETT inserted through vocal cords under direct vision, positive ETCO2 and breath sounds checked- equal and bilateral Secured at: 22 cm Tube secured with: Tape Dental Injury: Teeth and Oropharynx as per pre-operative assessment

## 2023-04-19 NOTE — Anesthesia Postprocedure Evaluation (Signed)
Anesthesia Post Note  Patient: Garda Juhnke  Procedure(s) Performed: LUMBAR LAMINECTOMY/DECOMPRESSION MICRODISCECTOMY  Lumbar Four - Five (Back)     Patient location during evaluation: PACU Anesthesia Type: General Level of consciousness: awake and alert Pain management: pain level controlled Vital Signs Assessment: post-procedure vital signs reviewed and stable Respiratory status: spontaneous breathing, nonlabored ventilation and respiratory function stable Cardiovascular status: blood pressure returned to baseline and stable Postop Assessment: no apparent nausea or vomiting Anesthetic complications: no   No notable events documented.  Last Vitals:  Vitals:   04/19/23 2130 04/19/23 2157  BP: (!) 147/85 (!) 162/81  Pulse: 98 98  Resp: 18 18  Temp: 36.9 C 36.5 C  SpO2: 95% 97%    Last Pain:  Vitals:   04/19/23 2130  TempSrc:   PainSc: 3                  Valen Gillison

## 2023-04-19 NOTE — Transfer of Care (Signed)
Immediate Anesthesia Transfer of Care Note  Patient: Latoya Mora  Procedure(s) Performed: LUMBAR LAMINECTOMY/DECOMPRESSION MICRODISCECTOMY  Lumbar Four - Five (Back)  Patient Location: PACU  Anesthesia Type:General  Level of Consciousness: awake and alert   Airway & Oxygen Therapy: Patient Spontanous Breathing  Post-op Assessment: Report given to RN and Post -op Vital signs reviewed and stable  Post vital signs: Reviewed and stable  Last Vitals:  Vitals Value Taken Time  BP 133/84 04/19/23 2048  Temp    Pulse 108 04/19/23 2049  Resp 24 04/19/23 2049  SpO2 91 % 04/19/23 2049  Vitals shown include unfiled device data.  Last Pain:  Vitals:   04/19/23 1650  TempSrc: Oral  PainSc: 2          Complications: No notable events documented.

## 2023-04-19 NOTE — Plan of Care (Signed)

## 2023-04-19 NOTE — Anesthesia Preprocedure Evaluation (Addendum)
Anesthesia Evaluation  Patient identified by MRN, date of birth, ID band Patient awake    Reviewed: Allergy & Precautions, NPO status , Patient's Chart, lab work & pertinent test results  Airway Mallampati: II  TM Distance: >3 FB Neck ROM: Full    Dental no notable dental hx.    Pulmonary neg pulmonary ROS   Pulmonary exam normal breath sounds clear to auscultation       Cardiovascular negative cardio ROS Normal cardiovascular exam Rhythm:Regular Rate:Normal     Neuro/Psych  Headaches PSYCHIATRIC DISORDERS Anxiety Depression       GI/Hepatic negative GI ROS, Neg liver ROS,,,  Endo/Other    Morbid obesity (Super)PCOS (polycystic ovarian syndrome)  Renal/GU negative Renal ROS     Musculoskeletal negative musculoskeletal ROS (+)    Abdominal  (+) + obese  Peds  Hematology  (+) Blood dyscrasia, anemia   Anesthesia Other Findings Left Dermoid Cyst  Reproductive/Obstetrics negative OB ROS hcg negative                             Anesthesia Physical Anesthesia Plan  ASA: 3  Anesthesia Plan: General   Post-op Pain Management: Ofirmev IV (intra-op)*   Induction: Intravenous  PONV Risk Score and Plan: 4 or greater and Scopolamine patch - Pre-op, Midazolam, Dexamethasone, Ondansetron and Treatment may vary due to age or medical condition  Airway Management Planned: Oral ETT  Additional Equipment:   Intra-op Plan:   Post-operative Plan: Extubation in OR  Informed Consent: I have reviewed the patients History and Physical, chart, labs and discussed the procedure including the risks, benefits and alternatives for the proposed anesthesia with the patient or authorized representative who has indicated his/her understanding and acceptance.     Dental advisory given  Plan Discussed with: CRNA  Anesthesia Plan Comments:         Anesthesia Quick Evaluation

## 2023-04-19 NOTE — Progress Notes (Signed)
Latoya Mora  ZOX:[id number] DOB: November 29, 1995 DOA: 04/18/2023 PCP: Patient, No Pcp Per    Brief Narrative:  27 year old with a history of chronic anemia, PCOS, status post left salpingo-oophorectomy, and depression/anxiety who presented to the [facility] ED with over a month of severe low back pain which has not improved despite over-the-counter medications as well as steroid and tizanidine provided at a local urgent care.  In the ER MRI revealed a large L4-5 disc herniation with severe central stenosis for which Neurosurgery recommended transfer to [facility] for treatment with a laminectomy.  Goals of Care:   Code Status: Full Code   DVT prophylaxis: SCD's Start: 04/18/23 2132 SCDs Start: 04/18/23 2028   Interim Hx: Afebrile.  Vital signs stable.  No acute events recorded overnight.  Resting comfortably at the time of my visit in no acute distress.  Assessment & Plan:  Large L4-5 disc herniation with severe central canal stenosis and cauda equina compression To the OR today for L4-5 laminectomy with microdiscectomy  Morbid obesity - Body mass index is 51.36 kg/m.  Chronic microcytic anemia Felt to be due to heavy irregular menstrual loss related to PCOS   Family Communication: No family present at time of visit Disposition: Will depend upon functional status postoperatively   Objective: Blood pressure (!) 130/93, pulse 98, temperature 98.3 F (36.8 C), temperature source Oral, resp. rate 16, height 5\' 8"  (1.727 m), weight (!) 153.2 kg, last menstrual period 03/21/2023, SpO2 99%, unknown if currently breastfeeding. No intake or output data in the 24 hours ending 04/19/23 0920 Filed Weights   04/18/23 1146  Weight: (!) 153.2 kg    Examination: General: No acute respiratory distress Lungs: Clear to auscultation bilaterally without wheezes or crackles Cardiovascular: Regular rate and rhythm without murmur gallop or rub normal S1 and S2 Extremities: No significant cyanosis,  clubbing, or edema bilateral lower extremities  CBC: Recent Labs  Lab 04/18/23 1459  WBC 10.7*  NEUTROABS 6.7  HGB 10.4*  HCT 37.7  MCV 69.7*  PLT 285   Basic Metabolic Panel: Recent Labs  Lab 04/18/23 1459  NA 137  K 3.6  CL 104  CO2 22  GLUCOSE 81  BUN 11  CREATININE 0.50  CALCIUM 8.8*   GFR: Estimated Creatinine Clearance: 163.1 mL/min (by C-G formula based on SCr of 0.5 mg/dL).   Scheduled Meds:  dexamethasone (DECADRON) injection  4 mg Intravenous Q6H   pantoprazole  40 mg Oral Daily   senna  1 tablet Oral BID   venlafaxine XR  150 mg Oral Q breakfast   Continuous Infusions:   ceFAZolin (ANCEF) IV       LOS: 1 day   Lonia Blood, MD [facility] Office  [phone] Pager - Text Page per Loretha Stapler  If 7PM-7AM, please contact night-coverage per Amion 04/19/2023, 9:20 AM

## 2023-04-19 NOTE — Consult Note (Addendum)
   Providing Compassionate, Quality Care - Together  Neurosurgery Consult  Referring physician: Dr. Sharon Seller Reason for referral: Lumbar disc herniation  Chief Complaint: Bilateral lower extremity pain, perineal numbness  History of Present Illness: This is a 27 year old female with progressively worsening pain of low back and bilateral lower extremities that prompted an emergency department visit.  She also began having numbness in her perineal area without bowel or bladder incontinence.  She denies lower extremity weakness, does complain of slight pain limited weakness.  She denies any recent trauma or falls.  She states this has been somewhat progressive.  She does complain of worsening pain whenever she coughs, sneezes or strains.  Medications: I have reviewed the patient's current medications. Allergies: No Known Allergies  History reviewed. No pertinent family history. Social History:  has no history on file for tobacco use, alcohol use, and drug use.  ROS: All pertinent positives and negatives are listed HPI above  Physical Exam:  Vital signs in last 24 hours: Temp:  [98 F (36.7 C)-98.3 F (36.8 C)] 98 F (36.7 C) (07/25 1814) Pulse Rate:  [58-128] 65 (07/26 0746) Resp:  [11-18] 14 (07/26 0217) BP: (138-182)/(65-125) 153/88 (07/26 0700) SpO2:  [91 %-98 %] 96 % (07/26 0746) PE: Awake alert oriented x 3, no acute distress PERRLA Cranial nerves II through XII intact Speech fluent appropriate Bilateral upper extremities full strength Bilateral lower extremities 4+/5 throughout Positive straight leg raise bilaterally Subjective perineal numbness Nonlabored breathing  Imaging: MRI lumbar spine independently reviewed, there is a large central L4-5 herniated nucleus pulposus causing severe canal stenosis and bilateral recess stenosis.  There is degenerative spondylosis at L5-S1 and degenerative disc at this level without high-grade stenosis.  There is facet arthropathy at  L4-5 and L5-S1.  Impression/Assessment:  27 year old female with  Large L4-5 central herniated nucleus pulposus with severe canal stenosis and subacute cauda equina syndrome   Plan:  -Given her perineal numbness and progressive symptoms, as well as the severity of her stenosis, I recommend surgical intervention in the form of an open L4-5 laminectomy, microdiscectomy.  We discussed all risks, benefits and expected outcomes as well as alternatives to treatment.  I answered all of their questions.  I extensively went over the MRI findings as well as expected surgical recovery and the details of the surgery.   Thank you for allowing me to participate in this patient's care.  Please do not hesitate to call with questions or concerns.   Monia Pouch, DO Neurosurgeon [facility] [phone]

## 2023-04-19 NOTE — Op Note (Signed)
Providing Compassionate, Quality Care - Together   Date of service: 04/19/2023   PREOP DIAGNOSIS:  L4-5 lumbar stenosis with BLE radiculopathy and perineal numbness, large HNP   POSTOP DIAGNOSIS: Same   PROCEDURE: Open left sided L4-5 laminectomy, medial facetectomy and microdiscectomy for decompression neural elements Intraoperative use of microscope for microdissection Intraoperative use of fluoroscopy   SURGEON: Dr. Kendell Bane C. Lumen Brinlee, DO   ASSISTANT: Patrici Ranks, PA   ANESTHESIA: General Endotracheal   EBL: 50 cc   SPECIMENS: None   DRAINS: None   COMPLICATIONS: None   CONDITION: Hemodynamically stable   HISTORY: Latoya Mora is a 27 y.o. female with a history of acute worsening low back pain and bilateral lower extremity radiculopathy, that became so severe she went to the emergency department.  She also complained of perineal numbness but did not have bowel or bladder incontinence.  MRI revealed a very large central slightly leftward herniated nucleus pulposus at L4-5 with severe canal stenosis and obliteration of the central canal.  Due to this I recommended surgical intervention in the form of an open lumbar laminectomy, L4-5 microdiscectomy.  We discussed all risks, benefits and expected outcomes as well as alternatives to treatment.   PROCEDURE IN DETAIL: The patient was brought to the operating room. After induction of general anesthesia, the patient was positioned on the operative table in the prone position. All pressure points were meticulously padded. Skin incision was then marked out and prepped and draped in the usual sterile fashion.   Using a 10 blade, midline incision was created over the L4, L5 spinous processes.  Using Bovie electrocautery soft tissue dissection was performed down to the lumbodorsal fascia.  Subperiosteal dissection was performed on the left with Bovie electrocautery exposing the L4 and superior L5 lamina on the left.  Deep retractors  placed in the wound.  Lateral fluoroscopy confirmed the appropriate level.  The microscope was sterilely draped and brought into the field. Using a high-speed drill, the L4 lamina was removed on the left to the lateral recess down to the ligamentum flavum and superiorly up to the ligamentous attachment bilaterally.  Using high-speed drill, a partial bilateral facetectomy was performed down to the ligamentum flavum.  The superior portion of the L5 lamina was then removed to the epidural space with the high speed drill. The ligamentum flavum was then gently dissected from the epidural space and resected with a series of Kerrison rongeurs to the lateral recess.  The traversing nerve root was identified and gently retracted medially.  There was a very large herniated nucleus pulposus and the nerve roots were tented over this.  Using bipolar electrocautery, epidural hemostasis was achieved and annulotomy was performed with a 15 blade.  Radical discectomy was performed using a series of pituitary rongeurs, Kerrison rongeurs and Epstein curettes.  There were very large multiple disc fragments that were removed.  Once this was done the common dural tube and traversing nerve root appeared adequately decompressed and pulsatile.  There were some chronic disc osteophyte complexes that were calcified that did not appear compressive.  I felt with a ball-tipped probe superiorly medially and inferiorly and followed the traversing nerve root and there was no compression identified.  Epidural hemostasis was achieved with Surgifoam.    Deep retractor was taken out of the wound.  Hemostasis was achieved with bipolar cautery and the soft tissues.  The wound was closed in layers with 0 Vicryl sutures for muscle and fascia.  Dermis was closed with 2-0 and  3-0 Vicryl sutures.  Skin was closed with skin glue.  Sterile dressing was applied.   At the end of the case all sponge, needle, and instrument counts were correct. The patient was  then transferred to the stretcher, extubated, and taken to the post-anesthesia care unit in stable hemodynamic condition.

## 2023-04-20 ENCOUNTER — Encounter (HOSPITAL_COMMUNITY): Payer: Self-pay | Admitting: Neurological Surgery

## 2023-04-20 LAB — BASIC METABOLIC PANEL
Anion gap: 8 (ref 5–15)
BUN: 11 mg/dL (ref 6–20)
CO2: 24 mmol/L (ref 22–32)
Calcium: 8.6 mg/dL — ABNORMAL LOW (ref 8.9–10.3)
Chloride: 105 mmol/L (ref 98–111)
Creatinine, Ser: 0.65 mg/dL (ref 0.44–1.00)
GFR, Estimated: >60 mL/min (ref >60)
Glucose, Bld: 182 mg/dL — ABNORMAL HIGH (ref 70–99)
Potassium: 4 mmol/L (ref 3.5–5.1)
Sodium: 137 mmol/L (ref 135–145)

## 2023-04-20 LAB — CBC
HCT: 31.1 % — ABNORMAL LOW (ref 36.0–46.0)
Hemoglobin: 9 g/dL — ABNORMAL LOW (ref 12.0–15.0)
MCH: 19.3 pg — ABNORMAL LOW (ref 26.0–34.0)
MCHC: 28.9 g/dL — ABNORMAL LOW (ref 30.0–36.0)
MCV: 66.6 fL — ABNORMAL LOW (ref 80.0–100.0)
Platelets: 317 10*3/uL (ref 150–400)
RBC: 4.67 MIL/uL (ref 3.87–5.11)
RDW: 18.6 % — ABNORMAL HIGH (ref 11.5–15.5)
WBC: 16.1 10*3/uL — ABNORMAL HIGH (ref 4.0–10.5)
nRBC: 0 % (ref 0.0–0.2)

## 2023-04-20 MED ORDER — OXYCODONE-ACETAMINOPHEN 5-325 MG PO TABS: 1.0000 | ORAL_TABLET | ORAL | 0 refills | Status: DC | PRN

## 2023-04-20 MED ORDER — TIZANIDINE HCL 4 MG PO CAPS: 4.0000 mg | ORAL_CAPSULE | Freq: Three times a day (TID) | ORAL | 0 refills | Status: DC | PRN

## 2023-04-20 NOTE — Discharge Summary (Signed)
Physician Discharge Summary  Patient ID: Latoya Mora MRN: [id number] DOB/AGE: 1996/05/02 27 y.o.  Admit date: 04/18/2023 Discharge date: 04/20/2023  Admission Diagnoses:  L4-5 lumbar stenosis with BLE radiculopathy and perineal numbness, large HNP     Discharge Diagnoses: same   Discharged Condition: good  Hospital Course: The patient was admitted on 04/18/2023 and taken to the operating room where the patient underwent lumbar microdiskectomy L4-5 left. The patient tolerated the procedure well and was taken to the recovery room and then to the floor in stable condition. The hospital course was routine. There were no complications. The wound remained clean dry and intact. Pt had appropriate back soreness. No complaints of leg pain or new N/T/W. The patient remained afebrile with stable vital signs, and tolerated a regular diet. The patient continued to increase activities, and pain was well controlled with oral pain medications.   Consults: None  Significant Diagnostic Studies:  Results for orders placed or performed during the hospital encounter of 04/18/23  Surgical pcr screen   Specimen: Nasal Mucosa; Nasal Swab  Result Value Ref Range   MRSA, PCR NEGATIVE NEGATIVE   Staphylococcus aureus NEGATIVE NEGATIVE  Basic metabolic panel  Result Value Ref Range   Sodium 137 135 - 145 mmol/L   Potassium 3.6 3.5 - 5.1 mmol/L   Chloride 104 98 - 111 mmol/L   CO2 22 22 - 32 mmol/L   Glucose, Bld 81 70 - 99 mg/dL   BUN 11 6 - 20 mg/dL   Creatinine, Ser 9.62 0.44 - 1.00 mg/dL   Calcium 8.8 (L) 8.9 - 10.3 mg/dL   GFR, Estimated >95 >28 mL/min   Anion gap 11 5 - 15  CBC with Differential  Result Value Ref Range   WBC 10.7 (H) 4.0 - 10.5 K/uL   RBC 5.41 (H) 3.87 - 5.11 MIL/uL   Hemoglobin 10.4 (L) 12.0 - 15.0 g/dL   HCT 41.3 24.4 - 01.0 %   MCV 69.7 (L) 80.0 - 100.0 fL   MCH 19.2 (L) 26.0 - 34.0 pg   MCHC 27.6 (L) 30.0 - 36.0 g/dL   RDW 27.2 (H) 53.6 - 64.4 %   Platelets 285 150 -  400 K/uL   nRBC 0.0 0.0 - 0.2 %   Neutrophils Relative % 61 %   Neutro Abs 6.7 1.7 - 7.7 K/uL   Lymphocytes Relative 30 %   Lymphs Abs 3.2 0.7 - 4.0 K/uL   Monocytes Relative 6 %   Monocytes Absolute 0.6 0.1 - 1.0 K/uL   Eosinophils Relative 2 %   Eosinophils Absolute 0.2 0.0 - 0.5 K/uL   Basophils Relative 0 %   Basophils Absolute 0.0 0.0 - 0.1 K/uL   WBC Morphology MORPHOLOGY UNREMARKABLE    RBC Morphology See Note    Smear Review MORPHOLOGY UNREMARKABLE    Immature Granulocytes 1 %   Abs Immature Granulocytes 0.05 0.00 - 0.07 K/uL   Polychromasia PRESENT    Ovalocytes PRESENT   Pregnancy, urine  Result Value Ref Range   Preg Test, Ur NEGATIVE NEGATIVE  Urinalysis, Routine w reflex microscopic -Urine, Clean Catch  Result Value Ref Range   Color, Urine YELLOW YELLOW   APPearance CLEAR CLEAR   Specific Gravity, Urine 1.023 1.005 - 1.030   pH 6.0 5.0 - 8.0   Glucose, UA NEGATIVE NEGATIVE mg/dL   Hgb urine dipstick NEGATIVE NEGATIVE   Bilirubin Urine NEGATIVE NEGATIVE   Ketones, ur NEGATIVE NEGATIVE mg/dL   Protein, ur NEGATIVE NEGATIVE mg/dL  Nitrite NEGATIVE NEGATIVE   Leukocytes,Ua TRACE (A) NEGATIVE   RBC / HPF 0-5 0 - 5 RBC/hpf   WBC, UA 0-5 0 - 5 WBC/hpf   Bacteria, UA NONE SEEN NONE SEEN   Squamous Epithelial / HPF 0-5 0 - 5 /HPF   Mucus PRESENT   HIV Antibody (routine testing w rflx)  Result Value Ref Range   HIV Screen 4th Generation wRfx Non Reactive Non Reactive  CBC  Result Value Ref Range   WBC 7.3 4.0 - 10.5 K/uL   RBC 5.12 (H) 3.87 - 5.11 MIL/uL   Hemoglobin 9.9 (L) 12.0 - 15.0 g/dL   HCT 95.6 (L) 21.3 - 08.6 %   MCV 66.8 (L) 80.0 - 100.0 fL   MCH 19.3 (L) 26.0 - 34.0 pg   MCHC 28.9 (L) 30.0 - 36.0 g/dL   RDW 57.8 (H) 46.9 - 62.9 %   Platelets 288 150 - 400 K/uL   nRBC 0.0 0.0 - 0.2 %  Basic metabolic panel  Result Value Ref Range   Sodium 135 135 - 145 mmol/L   Potassium 4.0 3.5 - 5.1 mmol/L   Chloride 108 98 - 111 mmol/L   CO2 18 (L) 22 - 32  mmol/L   Glucose, Bld 134 (H) 70 - 99 mg/dL   BUN 7 6 - 20 mg/dL   Creatinine, Ser 5.28 0.44 - 1.00 mg/dL   Calcium 8.6 (L) 8.9 - 10.3 mg/dL   GFR, Estimated >41 >32 mL/min   Anion gap 9 5 - 15  CBC  Result Value Ref Range   WBC 16.1 (H) 4.0 - 10.5 K/uL   RBC 4.67 3.87 - 5.11 MIL/uL   Hemoglobin 9.0 (L) 12.0 - 15.0 g/dL   HCT 44.0 (L) 10.2 - 72.5 %   MCV 66.6 (L) 80.0 - 100.0 fL   MCH 19.3 (L) 26.0 - 34.0 pg   MCHC 28.9 (L) 30.0 - 36.0 g/dL   RDW 36.6 (H) 44.0 - 34.7 %   Platelets 317 150 - 400 K/uL   nRBC 0.0 0.0 - 0.2 %  Basic metabolic panel  Result Value Ref Range   Sodium 137 135 - 145 mmol/L   Potassium 4.0 3.5 - 5.1 mmol/L   Chloride 105 98 - 111 mmol/L   CO2 24 22 - 32 mmol/L   Glucose, Bld 182 (H) 70 - 99 mg/dL   BUN 11 6 - 20 mg/dL   Creatinine, Ser 4.25 0.44 - 1.00 mg/dL   Calcium 8.6 (L) 8.9 - 10.3 mg/dL   GFR, Estimated >95 >63 mL/min   Anion gap 8 5 - 15    DG Lumbar Spine 2-3 Views  Result Date: 04/20/2023 CLINICAL DATA:  Lumbar laminectomy EXAM: LUMBAR SPINE - 2-3 VIEW COMPARISON:  MRI 04/18/2023 FINDINGS: Two low resolution intraoperative spot views of the lumbar spine. Total fluoroscopy time was 9.8 seconds, fluoroscopic dose of 10.27 mGy. The images demonstrate surgical instruments posterior to the L4-L5 disc space. IMPRESSION: Intraoperative fluoroscopic assistance provided during lumbar surgery. Electronically Signed   By: Jasmine Pang M.D.   On: 04/20/2023 00:32   DG C-Arm 1-60 Min-No Report  Result Date: 04/19/2023 Fluoroscopy was utilized by the requesting physician.  No radiographic interpretation.   DG C-Arm 1-60 Min-No Report  Result Date: 04/19/2023 Fluoroscopy was utilized by the requesting physician.  No radiographic interpretation.   DG C-Arm 1-60 Min-No Report  Result Date: 04/19/2023 Fluoroscopy was utilized by the requesting physician.  No radiographic interpretation.   MR  LUMBAR SPINE WO CONTRAST  Result Date:  04/18/2023 CLINICAL DATA:  Low back pain, cauda equina syndrome suspected EXAM: MRI LUMBAR SPINE WITHOUT CONTRAST TECHNIQUE: Multiplanar, multisequence MR imaging of the lumbar spine was performed. No intravenous contrast was administered. COMPARISON:  None Available. FINDINGS: Segmentation: 5 lumbar type vertebral bodies. On the 06/04/2022 abdomen pelvis CT, hypoplastic ribs are noted at T12. Alignment:  Dextrocurvature.  No significant listhesis. Vertebrae: No acute fracture, evidence of discitis, or suspicious osseous lesion. Conus medullaris and cauda equina: Conus extends to the L2 level. Cauda equina redundancy secondary to a large disc protrusion at L4-L5. Conus and cauda equina appear otherwise normal. Paraspinal and other soft tissues: Negative. Disc levels: T12-L1: Seen only on the sagittal images. No significant disc bulge, spinal canal stenosis, or neural foraminal narrowing. L1-L2: No significant disc bulge. No spinal canal stenosis or neural foraminal narrowing. L2-L3: No significant disc bulge. No spinal canal stenosis or neural foraminal narrowing. L3-L4: Central disc protrusion with annular fissure. Mild right facet arthropathy. No spinal canal stenosis or neural foraminal narrowing. L4-L5: Large central/left paracentral disc protrusion, which measures up to 12 mm in AP dimension (series 5, image 9 and series 8, image 24). This effaces both lateral recesses and causes severe spinal canal stenosis, with compression of the cauda equina. Mild right facet arthropathy. No neural foraminal narrowing. L5-S1: No significant disc bulge. No spinal canal stenosis or neural foraminal narrowing. IMPRESSION: L4-L5 large central/left paracentral disc protrusion, which causes severe spinal canal stenosis and compression of the cauda equina. Electronically Signed   By: Wiliam Ke M.D.   On: 04/18/2023 17:48    Antibiotics:  Anti-infectives (From admission, onward)    Start     Dose/Rate Route Frequency  Ordered Stop   04/20/23 0200  ceFAZolin (ANCEF) IVPB 2g/100 mL premix        2 g 200 mL/hr over 30 Minutes Intravenous Every 8 hours 04/19/23 2334 04/20/23 0932   04/18/23 2131  ceFAZolin (ANCEF) IVPB 3g/100 mL premix        3 g 200 mL/hr over 30 Minutes Intravenous 30 min pre-op 04/18/23 2131 04/19/23 1809       Discharge Exam: Blood pressure 128/67, pulse 99, temperature 98.3 F (36.8 C), temperature source Oral, resp. rate 20, height 5\' 8"  (1.727 m), weight (!) 153.2 kg, last menstrual period 03/21/2023, SpO2 99%, unknown if currently breastfeeding. Neurologic: Grossly normal Ambulating and voiding well incision cdi   Discharge Medications:   Allergies as of 04/20/2023       Reactions   Etodolac Nausea Only   Meloxicam Other (See Comments)   Abdominal pain   Flexeril [cyclobenzaprine] Nausea Only   Nsaids Other (See Comments)   Stomach pain. Pt ok with Motrin   Prozac [fluoxetine Hcl] Other (See Comments)   Felt bad/awful   Hydroxyzine Nausea Only        Medication List     TAKE these medications    acetaminophen 500 MG tablet Commonly known as: TYLENOL Take 1,000 mg by mouth every 6 (six) hours as needed (general pain).   baclofen 10 MG tablet Commonly known as: LIORESAL Take 1 tablet (10 mg total) by mouth 3 (three) times daily. What changed:  when to take this reasons to take this   EXCEDRIN PM PO Take 2 tablets by mouth at bedtime as needed (general pain).   oxyCODONE-acetaminophen 5-325 MG tablet Commonly known as: PERCOCET/ROXICET Take 1 tablet by mouth every 4 (four) hours as needed for severe pain (  pain score 7-10).   tiZANidine 4 MG capsule Commonly known as: Zanaflex Take 1 capsule (4 mg total) by mouth 3 (three) times daily as needed for muscle spasms. Do not drink alcohol or while taking this medication.  May cause drowsiness.   venlafaxine XR 150 MG 24 hr capsule Commonly known as: EFFEXOR-XR Take 1 capsule (150 mg total) by mouth daily  with breakfast.        Disposition: home   Final Dx: left L4-5 microdiskectomy  Discharge Instructions      Remove dressing in 72 hours   Complete by: As directed    Call MD for:  difficulty breathing, headache or visual disturbances   Complete by: As directed    Call MD for:  hives   Complete by: As directed    Call MD for:  persistant dizziness or light-headedness   Complete by: As directed    Call MD for:  persistant nausea and vomiting   Complete by: As directed    Call MD for:  redness, tenderness, or signs of infection (pain, swelling, redness, odor or green/yellow discharge around incision site)   Complete by: As directed    Call MD for:  severe uncontrolled pain   Complete by: As directed    Call MD for:  temperature >100.4   Complete by: As directed    Diet - low sodium heart healthy   Complete by: As directed    Driving Restrictions   Complete by: As directed    No driving for 2 weeks, no riding in the car for 1 week   Incentive spirometry RT   Complete by: As directed    Increase activity slowly   Complete by: As directed    Lifting restrictions   Complete by: As directed    No lifting more than 8 lbs          Signed: Tiana Loft Imanol Bihl 04/20/2023, 9:52 AM

## 2023-04-20 NOTE — Plan of Care (Signed)

## 2023-04-20 NOTE — Evaluation (Signed)
Occupational Therapy Evaluation Patient Details Name: Latoya Mora MRN: [id number] DOB: 1995/07/31 Today's Date: 04/20/2023   History of Present Illness 27 year old with a history of chronic anemia, PCOS, status post left salpingo-oophorectomy, and depression/anxiety. Pt is s/p L4-5 laminectomy/decompression microdiscectomy 04/19/2023.   Clinical Impression   Pt reports PTA she was independent with ADL/IADL and functional mobility. Pt demonstrates ability to ambulate independently and complete grooming/toileting/UB dressing at modified independent level with compensatory strategies to ensure adherence to precautions. Thoroughly reviewed back precautions with pt and use of good body mechanics going forward as she continues to care for her two young children. Patient evaluated by Occupational Therapy with no further acute OT needs identified. All education has been completed and the patient has no further questions. See below for any follow-up Occupational Therapy or equipment needs. OT to sign off. Thank you for referral.         If plan is discharge home, recommend the following: Assistance with cooking/housework    Functional Status Assessment  Patient has had a recent decline in their functional status and demonstrates the ability to make significant improvements in function in a reasonable and predictable amount of time.  Equipment Recommendations  None recommended by OT    Recommendations for Other Services       Precautions / Restrictions Precautions Precautions: Back Precaution Booklet Issued: Yes (comment) Precaution Comments: reviewed handout Required Braces or Orthoses:  (no brace required) Restrictions Other Position/Activity Restrictions: spinal precautions      Mobility Bed Mobility Overal bed mobility: Modified Independent             General bed mobility comments: use log roll technique with cues initially    Transfers Overall transfer level:  Independent Equipment used: None               General transfer comment: ambulating in hallway without use of AD and instability noted      Balance Overall balance assessment: Independent                                         ADL either performed or assessed with clinical judgement   ADL Overall ADL's : Needs assistance/impaired                                     Functional mobility during ADLs: Independent General ADL Comments: Pt required minA for LB dressing due to mobility limitations from pain/discomfort and body habitus. Pt educated on use of AE to complete LB dressing. Pt otherwise demonstrated ability to complete grooming/UB dressing, toileting, shower transfers at modified independent level using compensatory strategies to maintain back precautions.     Vision Baseline Vision/History: 1 Wears glasses Ability to See in Adequate Light: 0 Adequate Patient Visual Report: No change from baseline       Perception         Praxis         Pertinent Vitals/Pain Pain Assessment Pain Assessment: 0-10 Pain Score: 2  Pain Location: surgical site Pain Descriptors / Indicators: Sore Pain Intervention(s): Limited activity within patient's tolerance, Monitored during session     Extremity/Trunk Assessment Upper Extremity Assessment Upper Extremity Assessment: Defer to OT evaluation   Lower Extremity Assessment Lower Extremity Assessment: Overall WFL for tasks assessed   Cervical / Trunk Assessment Cervical / Trunk  Assessment: Back Surgery   Communication Communication Communication: No apparent difficulties   Cognition Arousal: Alert Behavior During Therapy: WFL for tasks assessed/performed Overall Cognitive Status: Within Functional Limits for tasks assessed                                       General Comments  vss on RA    Exercises     Shoulder Instructions      Home Living Family/patient  expects to be discharged to:: Private residence Living Arrangements: Spouse/significant other Available Help at Discharge: Family;Available PRN/intermittently Type of Home: House Home Access: Ramped entrance     Home Layout: Laundry or work area in basement     [employer] Shower/Tub: Producer, television/film/video: Standard Bathroom Accessibility: Yes How Accessible: Accessible via walker Home Equipment: Toilet riser;Other (comment);Grab bars - toilet;Shower seat Management consultant)          Prior Functioning/Environment Prior Level of Function : Independent/Modified Independent               ADLs Comments: has two children 3 and 51years old        OT Problem List: Pain;Decreased knowledge of precautions      OT Treatment/Interventions:      OT Goals(Current goals can be found in the care plan section) Acute Rehab OT Goals Patient Stated Goal: to go home today OT Goal Formulation: With patient Time For Goal Achievement: 05/04/23 Potential to Achieve Goals: Good  OT Frequency:      Co-evaluation              AM-PAC OT "6 Clicks" Daily Activity     Outcome Measure Help from another person eating meals?: None Help from another person taking care of personal grooming?: None Help from another person toileting, which includes using toliet, bedpan, or urinal?: None Help from another person bathing (including washing, rinsing, drying)?: None Help from another person to put on and taking off regular upper body clothing?: None Help from another person to put on and taking off regular lower body clothing?: A Little 6 Click Score: 23   End of Session Nurse Communication: Mobility status  Activity Tolerance: Patient tolerated treatment well Patient left: in bed;with call bell/phone within reach;with family/visitor present  OT Visit Diagnosis: Other abnormalities of gait and mobility (R26.89)                Time: 4696-2952 OT Time Calculation (min): 31 min Charges:  OT  General Charges $OT Visit: 1 Visit OT Evaluation $OT Eval Low Complexity: 1 Low OT Treatments $Self Care/Home Management : 8-22 mins  Latoya Mora OTR/L Acute Rehabilitation Services Office: [phone]   Latoya Mora 04/20/2023, 9:47 AM

## 2023-05-03 ENCOUNTER — Ambulatory Visit: Payer: Medicaid Other | Admitting: Podiatry

## 2023-05-10 ENCOUNTER — Ambulatory Visit: Payer: Medicaid Other | Admitting: Podiatry

## 2023-05-17 ENCOUNTER — Emergency Department (HOSPITAL_COMMUNITY)
Admission: EM | Admit: 2023-05-17 | Discharge: 2023-05-17 | Disposition: A | Discharge status: Home / Self Care | Payer: Self-pay | Attending: Emergency Medicine | Admitting: Emergency Medicine

## 2023-05-17 ENCOUNTER — Other Ambulatory Visit: Payer: Self-pay

## 2023-05-17 ENCOUNTER — Encounter (HOSPITAL_COMMUNITY): Payer: Self-pay

## 2023-05-17 DIAGNOSIS — Z5321 Procedure and treatment not carried out due to patient leaving prior to being seen by health care provider: Secondary | ICD-10-CM | POA: Insufficient documentation

## 2023-05-17 DIAGNOSIS — R1011 Right upper quadrant pain: Secondary | ICD-10-CM | POA: Insufficient documentation

## 2023-05-17 LAB — URINALYSIS, ROUTINE W REFLEX MICROSCOPIC
Bilirubin Urine: NEGATIVE
Glucose, UA: NEGATIVE mg/dL
Hgb urine dipstick: NEGATIVE
Ketones, ur: NEGATIVE mg/dL
Leukocytes,Ua: NEGATIVE
Nitrite: NEGATIVE
Protein, ur: 30 mg/dL — AB
Specific Gravity, Urine: 1.024 (ref 1.005–1.030)
pH: 7 (ref 5.0–8.0)

## 2023-05-17 LAB — COMPREHENSIVE METABOLIC PANEL
ALT: 16 U/L (ref 0–44)
AST: 13 U/L — ABNORMAL LOW (ref 15–41)
Albumin: 3.6 g/dL (ref 3.5–5.0)
Alkaline Phosphatase: 102 U/L (ref 38–126)
Anion gap: 8 (ref 5–15)
BUN: 11 mg/dL (ref 6–20)
CO2: 23 mmol/L (ref 22–32)
Calcium: 8.8 mg/dL — ABNORMAL LOW (ref 8.9–10.3)
Chloride: 105 mmol/L (ref 98–111)
Creatinine, Ser: 0.53 mg/dL (ref 0.44–1.00)
GFR, Estimated: >60 mL/min (ref >60)
Glucose, Bld: 104 mg/dL — ABNORMAL HIGH (ref 70–99)
Potassium: 3.7 mmol/L (ref 3.5–5.1)
Sodium: 136 mmol/L (ref 135–145)
Total Bilirubin: 0.4 mg/dL (ref <1.2)
Total Protein: 7.4 g/dL (ref 6.5–8.1)

## 2023-05-17 LAB — CBC
HCT: 32.9 % — ABNORMAL LOW (ref 36.0–46.0)
Hemoglobin: 9.5 g/dL — ABNORMAL LOW (ref 12.0–15.0)
MCH: 19.8 pg — ABNORMAL LOW (ref 26.0–34.0)
MCHC: 28.9 g/dL — ABNORMAL LOW (ref 30.0–36.0)
MCV: 68.7 fL — ABNORMAL LOW (ref 80.0–100.0)
Platelets: 273 10*3/uL (ref 150–400)
RBC: 4.79 MIL/uL (ref 3.87–5.11)
RDW: 18.8 % — ABNORMAL HIGH (ref 11.5–15.5)
WBC: 10.4 10*3/uL (ref 4.0–10.5)
nRBC: 0 % (ref 0.0–0.2)

## 2023-05-17 LAB — LIPASE, BLOOD: Lipase: 31 U/L (ref 11–51)

## 2023-05-17 LAB — POC URINE PREG, ED: Preg Test, Ur: NEGATIVE

## 2023-05-17 NOTE — ED Notes (Signed)
Pt to the nurses station stating that she was leaving due to feeling better. Pt advised to return if her sx return or worsen.

## 2023-05-17 NOTE — ED Triage Notes (Signed)
Pt presents to ED with c/o RUQ pain that started yesterday around noon, pt says she thinks it is her gallbladder, hx of gallstones.

## 2023-07-12 ENCOUNTER — Ambulatory Visit (INDEPENDENT_AMBULATORY_CARE_PROVIDER_SITE_OTHER): Payer: Self-pay | Admitting: Physician Assistant

## 2023-07-12 ENCOUNTER — Encounter: Payer: Self-pay | Admitting: Physician Assistant

## 2023-07-12 VITALS — BP 128/88 | Ht 68.0 in | Wt 334.2 lb

## 2023-07-12 DIAGNOSIS — F419 Anxiety disorder, unspecified: Secondary | ICD-10-CM

## 2023-07-12 DIAGNOSIS — Z7689 Persons encountering health services in other specified circumstances: Secondary | ICD-10-CM

## 2023-07-12 DIAGNOSIS — F32A Depression, unspecified: Secondary | ICD-10-CM

## 2023-07-12 MED ORDER — CARIPRAZINE HCL 1.5 MG PO CAPS: 1.5000 mg | ORAL_CAPSULE | Freq: Every day | ORAL | 3 refills | Status: DC

## 2023-07-12 MED ORDER — VENLAFAXINE HCL ER 150 MG PO CP24: 150.0000 mg | ORAL_CAPSULE | Freq: Every day | ORAL | 3 refills | Status: DC

## 2023-07-12 MED ORDER — BUSPIRONE HCL 5 MG PO TABS: 5.0000 mg | ORAL_TABLET | Freq: Two times a day (BID) | ORAL | 3 refills | Status: DC

## 2023-07-12 NOTE — Addendum Note (Signed)
Addended by: Olga Millers on: 07/12/2023 02:19 PM   Modules accepted: Orders

## 2023-07-12 NOTE — Progress Notes (Signed)
New Patient Office Visit  Subjective    Patient ID: Latoya Mora, female    DOB: 08/04/1995  Age: 28 y.o. MRN: [id number]  CC:  Chief Complaint  Patient presents with   Establish Care    Discuss mental health and weight    HPI Latoya Mora presents to establish care  Patient is a 28 year old female with past medical history significant for depression, anxiety, PCOS, low back pain, and obesity.  Patient requests to discuss antidepression medications today and weight loss options.  Patient reports longstanding history of mental health concerns with evaluation from psychiatry and therapy.  Patient states she has trialed many antidepressants and found that Effexor is most effective for her.  Today she reports increased and depression and anxiety.  She states she feels overwhelmed and "stuck in her own head".  She reports feelings of paranoia and suspicions of others manipulating her.  She reports increase in anxiety attacks, and reports feeling little joy in life.  Patient also requests discussion on weight loss options today.  Would like to discuss medications for weight loss.  Outpatient Encounter Medications as of 07/12/2023  Medication Sig   busPIRone (BUSPAR) 5 MG tablet Take 1 tablet (5 mg total) by mouth 2 (two) times daily.   cariprazine (VRAYLAR) 1.5 MG capsule Take 1 capsule (1.5 mg total) by mouth daily.   acetaminophen (TYLENOL) 500 MG tablet Take 1,000 mg by mouth every 6 (six) hours as needed (general pain).   diphenhydrAMINE-APAP, sleep, (EXCEDRIN PM PO) Take 2 tablets by mouth at bedtime as needed (general pain).   tiZANidine (ZANAFLEX) 4 MG capsule Take 1 capsule (4 mg total) by mouth 3 (three) times daily as needed for muscle spasms. Do not drink alcohol or while taking this medication.  May cause drowsiness.   venlafaxine XR (EFFEXOR-XR) 150 MG 24 hr capsule Take 1 capsule (150 mg total) by mouth daily with breakfast.   [DISCONTINUED] baclofen (LIORESAL) 10 MG tablet Take 1  tablet (10 mg total) by mouth 3 (three) times daily. (Patient taking differently: Take 10 mg by mouth 3 (three) times daily as needed for muscle spasms.)   [DISCONTINUED] oxyCODONE-acetaminophen (PERCOCET/ROXICET) 5-325 MG tablet Take 1 tablet by mouth every 4 (four) hours as needed for severe pain (pain score 7-10).   [DISCONTINUED] tiZANidine (ZANAFLEX) 4 MG tablet Take by mouth.   [DISCONTINUED] venlafaxine XR (EFFEXOR-XR) 150 MG 24 hr capsule Take 1 capsule (150 mg total) by mouth daily with breakfast.   No facility-administered encounter medications on file as of 07/12/2023.    Past Medical History:  Diagnosis Date   Allergy    Anemia    during pregnancies   Anxiety    Anxiety and depression 09/18/2016   effexor 150mg >11/04/17 wants to wean off  Significant anxiety and depression, postpartum--consider meds at pregnancy end or immed. Pp, safe with breast feeding only     Depression    Elevated liver enzymes    with both pregnancies, no problems after pregnancies   Frequent headaches    no longer having frequent headaches   History of pre-eclampsia    Morbid obesity (HCC) 04/03/2017   PCOS (polycystic ovarian syndrome)     Past Surgical History:  Procedure Laterality Date   LAPAROSCOPIC SALPINGO OOPHERECTOMY Left 02/23/2020   Procedure: LAPAROSCOPIC SALPINGO OOPHORECTOMY;  Surgeon: Tereso Newcomer, MD;  Location: [facility] OR;  Service: Gynecology;  Laterality: Left;   LUMBAR LAMINECTOMY/DECOMPRESSION MICRODISCECTOMY N/A 04/19/2023   Procedure: LUMBAR LAMINECTOMY/DECOMPRESSION MICRODISCECTOMY  Lumbar Four -  Five;  Surgeon: Bethann Goo, DO;  Location: [facility] OR;  Service: Neurosurgery;  Laterality: N/A;   TOOTH EXTRACTION     local anesthestic only    Family History  Problem Relation Age of Onset   Depression Mother    Anxiety disorder Mother    Heart disease Mother    Skin cancer Mother    Scoliosis Mother    Alcohol abuse Father    Bipolar disorder Father    Depression Father     Osteochondroma Father    COPD Maternal Grandmother    Scoliosis Maternal Grandmother    COPD Maternal Grandfather    Skin cancer Maternal Grandfather    Parkinson's disease Maternal Grandfather    Arthritis Paternal Grandmother    Diabetes Paternal Grandmother    Bipolar disorder Paternal Grandfather    Osteochondroma Sister    Osteochondroma Brother    Cancer Neg Hx     Social History   Socioeconomic History   Marital status: Married    Spouse name: Sheray Erling   Number of children: Not on file   Years of education: Not on file   Highest education level: Not on file  Occupational History   Not on file  Tobacco Use   Smoking status: Never   Smokeless tobacco: Never  Vaping Use   Vaping status: Never Used  Substance and Sexual Activity   Alcohol use: Not Currently   Drug use: No   Sexual activity: Yes    Birth control/protection: None  Other Topics Concern   Not on file  Social History Narrative   Not on file   Social Drivers of Health   Financial Resource Strain: Low Risk  (05/21/2018)   Overall Financial Resource Strain (CARDIA)    Difficulty of Paying Living Expenses: Not hard at all  Food Insecurity: No Food Insecurity (05/21/2018)   Hunger Vital Sign    Worried About Running Out of Food in the Last Year: Never true    Ran Out of Food in the Last Year: Never true  Transportation Needs: Unknown (05/21/2018)   PRAPARE - Administrator, Civil Service (Medical): No    Lack of Transportation (Non-Medical): Not on file  Physical Activity: Not on file  Stress: Stress Concern Present (05/21/2018)   [employer] of Occupational Health - Occupational Stress Questionnaire    Feeling of Stress : To some extent  Social Connections: Not on file  Intimate Partner Violence: Not At Risk (05/21/2018)   Humiliation, Afraid, Rape, and Kick questionnaire    Fear of Current or Ex-Partner: No    Emotionally Abused: No    Physically Abused: No    Sexually  Abused: No    Review of Systems  Constitutional:  Negative for chills, fever and malaise/fatigue.  Respiratory:  Negative for cough, shortness of breath and wheezing.   Cardiovascular:  Negative for chest pain and palpitations.  Gastrointestinal:  Negative for abdominal pain, constipation, diarrhea and nausea.  Psychiatric/Behavioral:  Negative for depression. The patient is not nervous/anxious.        Paranoia         Objective    BP 128/88   Ht 5\' 8"  (1.727 m)   Wt (!) 334 lb 3.2 oz (151.6 kg)   BMI 50.81 kg/m   Physical Exam Vitals reviewed.  Constitutional:      General: She is not in acute distress.    Appearance: Normal appearance. She is obese.  HENT:     Nose:  Nose normal.     Mouth/Throat:     Mouth: Mucous membranes are moist.     Pharynx: Oropharynx is clear.  Eyes:     Extraocular Movements: Extraocular movements intact.     Conjunctiva/sclera: Conjunctivae normal.  Cardiovascular:     Rate and Rhythm: Normal rate and regular rhythm.     Heart sounds: No murmur heard.    No friction rub. No gallop.  Pulmonary:     Effort: Pulmonary effort is normal.     Breath sounds: Normal breath sounds. No stridor. No wheezing, rhonchi or rales.  Musculoskeletal:        General: Normal range of motion.  Skin:    General: Skin is warm and dry.     Capillary Refill: Capillary refill takes less than 2 seconds.  Neurological:     General: No focal deficit present.     Mental Status: She is alert and oriented to person, place, and time.  Psychiatric:        Mood and Affect: Mood is anxious. Affect is tearful.        Behavior: Behavior normal.        Thought Content: Thought content is not paranoid. Thought content does not include homicidal or suicidal ideation.       Assessment & Plan:  Encounter to establish care  Anxiety and depression Assessment & Plan: PHQ9 and GAD7 scores reviewed today. With long standing history of mental health concerns and trial and  errors on antidepressants, keeping patient on Effexor at this time. Refilled today. In addition to Effexor, starting patient on Vraylar as adjunct. Discussed side effects of antidepressants today. Buspar added today for anxiety attacks. Psych referral for further evaluation and management of major depression and anxiety disorder. Patient following up in 4 weeks for mental health check in and reevaluation of antidepressant.  Orders: -     Cariprazine HCl; Take 1 capsule (1.5 mg total) by mouth daily.  Dispense: 30 capsule; Refill: 3 -     Venlafaxine HCl ER; Take 1 capsule (150 mg total) by mouth daily with breakfast.  Dispense: 90 capsule; Refill: 3 -     busPIRone HCl; Take 1 tablet (5 mg total) by mouth 2 (two) times daily.  Dispense: 60 tablet; Refill: 3 -     Ambulatory referral to Psychiatry  Morbid obesity (HCC) Assessment & Plan: Discussed healthy diet changes and increased daily exercise. Patient advised of difficulty covering weight loss medications. Want to optimize depression and anxiety prior to starting on weight loss meds. Patient agreeable to 4 week follow up to discuss antidepressants and weight loss options at that time. Referral to nutrition today to discuss healthy diet.   Orders: -     Amb Referral to Nutrition and Diabetic Education    Return in about 4 weeks (around 08/09/2023).   Toni Amend Raylen Tangonan, PA-C

## 2023-07-12 NOTE — Assessment & Plan Note (Signed)
Discussed healthy diet changes and increased daily exercise. Patient advised of difficulty covering weight loss medications. Want to optimize depression and anxiety prior to starting on weight loss meds. Patient agreeable to 4 week follow up to discuss antidepressants and weight loss options at that time. Referral to nutrition today to discuss healthy diet.

## 2023-07-12 NOTE — Assessment & Plan Note (Addendum)
PHQ9 and GAD7 scores reviewed today. With long standing history of mental health concerns and trial and errors on antidepressants, keeping patient on Effexor at this time. Refilled today. In addition to Effexor, starting patient on Vraylar as adjunct. Discussed side effects of antidepressants today. Buspar added today for anxiety attacks. Psych referral for further evaluation and management of major depression and anxiety disorder. Patient following up in 4 weeks for mental health check in and reevaluation of antidepressant.

## 2023-07-12 NOTE — Progress Notes (Signed)
Patient unable to start Vraylar due to cost. Will continue with Effexor and Buspar at this time. Will consider adding Abilify in the future if symptoms persist.

## 2023-07-16 ENCOUNTER — Telehealth: Payer: Self-pay | Admitting: Physician Assistant

## 2023-07-16 DIAGNOSIS — R3989 Other symptoms and signs involving the genitourinary system: Secondary | ICD-10-CM

## 2023-07-16 MED ORDER — CEPHALEXIN 500 MG PO CAPS: 500.0000 mg | ORAL_CAPSULE | Freq: Two times a day (BID) | ORAL | 0 refills | Status: AC

## 2023-07-16 NOTE — Progress Notes (Signed)
I have spent 5 minutes in review of e-visit questionnaire, review and updating patient chart, medical decision making and response to patient.   Mia Milan Cody Jacklynn Dehaas, PA-C    

## 2023-07-16 NOTE — Progress Notes (Signed)

## 2023-08-09 ENCOUNTER — Ambulatory Visit: Payer: No Typology Code available for payment source | Admitting: Physician Assistant

## 2023-08-09 ENCOUNTER — Encounter: Payer: Self-pay | Admitting: Physician Assistant

## 2023-08-09 VITALS — BP 118/81 | HR 98 | Temp 97.2°F | Ht 68.0 in | Wt 338.4 lb

## 2023-08-09 DIAGNOSIS — F419 Anxiety disorder, unspecified: Secondary | ICD-10-CM | POA: Diagnosis not present

## 2023-08-09 DIAGNOSIS — F32A Depression, unspecified: Secondary | ICD-10-CM | POA: Diagnosis not present

## 2023-08-09 MED ORDER — ARIPIPRAZOLE 5 MG PO TABS: 5.0000 mg | ORAL_TABLET | Freq: Every day | ORAL | 1 refills | Status: DC

## 2023-08-09 NOTE — Assessment & Plan Note (Signed)
Unchanged since last visit. Patient reports short intermittent heart palpitations daily with Buspar, discontinuing today. Starting patient on Abilify 5mg  daily for treatment resistant depression. Patient counseled on side effects and advised to stop taking medication if she experiences increased suicidal ideation. I encouraged her to schedule appointment with psychiatry as they had previously left fer a voicemail for an appointment. Patient should follow up in about 1 month.

## 2023-08-09 NOTE — Progress Notes (Signed)
   Established Patient Office Visit  Subjective   Patient ID: Latoya Mora, female    DOB: 01/10/1996  Age: 28 y.o. MRN: [id number]  No chief complaint on file.   Patient in today for follow up regarding anxiety and depression. She states symptoms are unchanged since last visit. She reports heart "flutters"/palpitations since starting Buspar last month. She states palpitations occur 2-3 times daily and last for approx 30 seconds at a time. She denies chest pain or shortness of breath. Patient contacted by psychiatry but has not yet scheduled appointment with them. She denies suicidal or homicidal ideation today.      Review of Systems  Constitutional:  Positive for malaise/fatigue.  Respiratory:  Negative for cough and shortness of breath.   Cardiovascular:  Positive for palpitations. Negative for chest pain.  Musculoskeletal:  Negative for myalgias.  Psychiatric/Behavioral:  Positive for depression. Negative for suicidal ideas. The patient is nervous/anxious.       Objective:     BP 118/81   Pulse 98   Temp (!) 97.2 F (36.2 C)   Ht 5\' 8"  (1.727 m)   Wt (!) 338 lb 6.4 oz (153.5 kg)   SpO2 100%   BMI 51.45 kg/m    Physical Exam Constitutional:      Appearance: Normal appearance. She is obese.  HENT:     Head: Normocephalic.     Mouth/Throat:     Mouth: Mucous membranes are moist.     Pharynx: Oropharynx is clear.  Eyes:     Extraocular Movements: Extraocular movements intact.     Conjunctiva/sclera: Conjunctivae normal.  Cardiovascular:     Rate and Rhythm: Normal rate and regular rhythm.     Heart sounds: Normal heart sounds. No murmur heard.    No gallop.  Pulmonary:     Effort: Pulmonary effort is normal.     Breath sounds: No wheezing, rhonchi or rales.  Musculoskeletal:     Right lower leg: No edema.     Left lower leg: No edema.  Skin:    General: Skin is warm and dry.  Neurological:     General: No focal deficit present.     Mental Status: She is  alert and oriented to person, place, and time.  Psychiatric:        Mood and Affect: Mood is depressed. Affect is flat.        Behavior: Behavior is withdrawn.      No results found for any visits on 08/09/23.  The ASCVD Risk score (Arnett DK, et al., 2019) failed to calculate for the following reasons:   The 2019 ASCVD risk score is only valid for ages 49 to 75    Assessment & Plan:   Return in about 5 weeks (around 09/13/2023).   Anxiety and depression Assessment & Plan: Unchanged since last visit. Patient reports short intermittent heart palpitations daily with Buspar, discontinuing today. Starting patient on Abilify 5mg  daily for treatment resistant depression. Patient counseled on side effects and advised to stop taking medication if she experiences increased suicidal ideation. I encouraged her to schedule appointment with psychiatry as they had previously left fer a voicemail for an appointment. Patient should follow up in about 1 month.   Orders: -     ARIPiprazole; Take 1 tablet (5 mg total) by mouth daily.  Dispense: 30 tablet; Refill: 1    Eldrige Pitkin, PA-C

## 2023-08-16 ENCOUNTER — Encounter: Payer: Self-pay | Admitting: Physician Assistant

## 2023-08-17 ENCOUNTER — Encounter: Payer: Self-pay | Admitting: Physician Assistant

## 2023-08-24 ENCOUNTER — Telehealth: Admitting: Family Medicine

## 2023-08-24 DIAGNOSIS — R3989 Other symptoms and signs involving the genitourinary system: Secondary | ICD-10-CM

## 2023-08-24 MED ORDER — CEPHALEXIN 500 MG PO CAPS: 500.0000 mg | ORAL_CAPSULE | Freq: Two times a day (BID) | ORAL | 0 refills | Status: AC

## 2023-08-24 NOTE — Progress Notes (Signed)

## 2023-09-05 ENCOUNTER — Ambulatory Visit: Payer: No Typology Code available for payment source | Admitting: Obstetrics & Gynecology

## 2023-09-09 ENCOUNTER — Other Ambulatory Visit: Payer: Self-pay | Admitting: Physician Assistant

## 2023-09-09 ENCOUNTER — Telehealth: Admitting: Physician Assistant

## 2023-09-09 DIAGNOSIS — Z130 Encounter for screening for diseases of the blood and blood-forming organs and certain disorders involving the immune mechanism: Secondary | ICD-10-CM

## 2023-09-09 DIAGNOSIS — M545 Low back pain, unspecified: Secondary | ICD-10-CM

## 2023-09-09 DIAGNOSIS — Z13220 Encounter for screening for lipoid disorders: Secondary | ICD-10-CM

## 2023-09-09 DIAGNOSIS — Z1329 Encounter for screening for other suspected endocrine disorder: Secondary | ICD-10-CM

## 2023-09-09 MED ORDER — BACLOFEN 10 MG PO TABS: 10.0000 mg | ORAL_TABLET | Freq: Three times a day (TID) | ORAL | 0 refills | Status: DC

## 2023-09-09 NOTE — Progress Notes (Signed)
 I have spent 5 minutes in review of e-visit questionnaire, review and updating patient chart, medical decision making and response to patient.   Piedad Climes, PA-C

## 2023-09-09 NOTE — Progress Notes (Signed)

## 2023-09-13 ENCOUNTER — Ambulatory Visit: Payer: No Typology Code available for payment source | Admitting: Physician Assistant

## 2023-09-13 ENCOUNTER — Encounter: Payer: Self-pay | Admitting: Physician Assistant

## 2023-09-13 ENCOUNTER — Ambulatory Visit: Admitting: Physician Assistant

## 2023-09-13 ENCOUNTER — Telehealth: Admitting: Physician Assistant

## 2023-09-13 DIAGNOSIS — F32A Depression, unspecified: Secondary | ICD-10-CM | POA: Diagnosis not present

## 2023-09-13 DIAGNOSIS — R002 Palpitations: Secondary | ICD-10-CM | POA: Insufficient documentation

## 2023-09-13 DIAGNOSIS — F419 Anxiety disorder, unspecified: Secondary | ICD-10-CM

## 2023-09-13 MED ORDER — ARIPIPRAZOLE 10 MG PO TABS: 10.0000 mg | ORAL_TABLET | Freq: Every day | ORAL | 3 refills | Status: DC

## 2023-09-13 NOTE — Assessment & Plan Note (Signed)
 Significantly improved since last visit.  She reports great improvements in her mood since starting Abilify 5 mg daily.  She reports feeling more like herself and feeling happier and more hopeful.  She denies adverse effects.  Will increase dose to 10 mg daily.  Patient was scheduled psychiatry appointment in April.  We will follow-up in 3 months or as needed.

## 2023-09-13 NOTE — Progress Notes (Deleted)
   Established Patient Office Visit  Subjective   Patient ID: Latoya Mora, female    DOB: [birth year]  Age: 28 y.o. MRN: [id number]  Chief Complaint  Patient presents with   medication checks    Tizanadine - heart issues - feels more severe than palpitations    HPI   ROS    Objective:     There were no vitals taken for this visit.   Physical Exam   No results found for any visits on 09/13/23.  The ASCVD Risk score (Arnett DK, et al., 2019) failed to calculate for the following reasons:   The 2019 ASCVD risk score is only valid for ages 8 to 66    Assessment & Plan:   No follow-ups on file.   Anxiety and depression -     ARIPiprazole; Take 1 tablet (10 mg total) by mouth daily.  Dispense: 30 tablet; Refill: 3    Latoya Danish, PA-C

## 2023-09-13 NOTE — Assessment & Plan Note (Addendum)
 Significantly proved since last visit.  Patient advised to avoid she has anything in future.  Will make a note in her chart.  Patient to continue with baclofen as needed for back pain and spasms.

## 2023-09-13 NOTE — Progress Notes (Signed)
 Virtual Visit via Video Note  I connected with Latoya Mora on 09/13/23 at 10:00 AM EDT by a video enabled telemedicine application and verified that I am speaking with the correct person using two identifiers.  Patient Location: Home Provider Location: Office/Clinic  I discussed the limitations, risks, security, and privacy concerns of performing an evaluation and management service by video and the availability of in person appointments. I also discussed with the patient that there may be a patient responsible charge related to this service. The patient expressed understanding and agreed to proceed.  Subjective: PCP: Ellyson Rarick, PA-C  Chief Complaint  Patient presents with   medication checks    Tizanadine - heart issues - feels more severe than palpitations   Patient presents today for follow-up regarding depression and anxiety.  Previously started on Abilify at her last appointment.  She reports feeling much more like herself today.  She denies adverse effects from medication.  She does have scheduled psychiatry appointment next month.  Patient denies SI or HI today.  Patient reports improvement in heart palpitations since last visit. She states she had previously been taking tizanidine prn for back pain/spasms.  However recently stopped taking this medication as she noticed a pattern in taking it the day prior and having heart palpitations the day after.  She reports resolution of heart palpitations after switching from tizanidine to baclofen, both added previously prescribed by other providers.    ROS: Per HPI  Current Outpatient Medications:    acetaminophen (TYLENOL) 500 MG tablet, Take 1,000 mg by mouth every 6 (six) hours as needed (general pain)., Disp: , Rfl:    ARIPiprazole (ABILIFY) 10 MG tablet, Take 1 tablet (10 mg total) by mouth daily., Disp: 30 tablet, Rfl: 3   baclofen (LIORESAL) 10 MG tablet, Take 1 tablet (10 mg total) by mouth 3 (three) times daily., Disp:  30 each, Rfl: 0   diphenhydrAMINE-APAP, sleep, (EXCEDRIN PM PO), Take 2 tablets by mouth at bedtime as needed (general pain)., Disp: , Rfl:    venlafaxine XR (EFFEXOR-XR) 150 MG 24 hr capsule, Take 1 capsule (150 mg total) by mouth daily with breakfast., Disp: 90 capsule, Rfl: 3  Observations/Objective: There were no vitals filed for this visit. Physical Exam Constitutional:      Appearance: Normal appearance.  HENT:     Head: Normocephalic.  Eyes:     Extraocular Movements: Extraocular movements intact.  Pulmonary:     Effort: Pulmonary effort is normal.  Musculoskeletal:     Cervical back: Normal range of motion.  Neurological:     Mental Status: She is alert.    Assessment and Plan: Anxiety and depression Assessment & Plan: Significantly improved since last visit.  She reports great improvements in her mood since starting Abilify 5 mg daily.  She reports feeling more like herself and feeling happier and more hopeful.  She denies adverse effects.  Will increase dose to 10 mg daily.  Patient was scheduled psychiatry appointment in April.  We will follow-up in 3 months or as needed.   Orders: -     ARIPiprazole; Take 1 tablet (10 mg total) by mouth daily.  Dispense: 30 tablet; Refill: 3  Heart palpitations Assessment & Plan: Significantly proved since last visit.  Patient advised to avoid she has anything in future.  Will make a note in her chart.  Patient to continue with baclofen as needed for back pain and spasms.    Follow Up Instructions: Return in about 3 months (  around 12/14/2023). Continue with scheduled psychiatry appointment.    I discussed the assessment and treatment plan with the patient. The patient was provided an opportunity to ask questions, and all were answered. The patient agreed with the plan and demonstrated an understanding of the instructions.   The patient was advised to call back or seek an in-person evaluation if the symptoms worsen or if the  condition fails to improve as anticipated.  The above assessment and management plan was discussed with the patient. The patient verbalized understanding of and has agreed to the management plan.   Toni Amend Dorina Ribaudo, PA-C

## 2023-09-20 ENCOUNTER — Encounter: Payer: Self-pay | Admitting: Physician Assistant

## 2023-10-04 ENCOUNTER — Other Ambulatory Visit: Payer: Self-pay | Admitting: Physician Assistant

## 2023-10-04 DIAGNOSIS — F32A Depression, unspecified: Secondary | ICD-10-CM

## 2023-10-10 ENCOUNTER — Telehealth (HOSPITAL_COMMUNITY): Payer: Self-pay

## 2023-10-10 NOTE — Telephone Encounter (Signed)
 Lvm to confirm 10/14/23 appt by 12pm 10/11/23

## 2023-10-10 NOTE — Telephone Encounter (Signed)
 Patient confirmed appt.

## 2023-10-14 ENCOUNTER — Telehealth (HOSPITAL_COMMUNITY): Payer: Self-pay

## 2023-10-14 ENCOUNTER — Ambulatory Visit (HOSPITAL_COMMUNITY): Payer: No Typology Code available for payment source | Admitting: Registered Nurse

## 2023-10-14 ENCOUNTER — Encounter (HOSPITAL_COMMUNITY): Payer: Self-pay | Admitting: Registered Nurse

## 2023-10-14 ENCOUNTER — Other Ambulatory Visit (HOSPITAL_COMMUNITY): Payer: Self-pay | Admitting: *Deleted

## 2023-10-14 VITALS — BP 132/83 | HR 88 | Ht 68.0 in | Wt 343.8 lb

## 2023-10-14 DIAGNOSIS — Z79899 Other long term (current) drug therapy: Secondary | ICD-10-CM | POA: Diagnosis not present

## 2023-10-14 DIAGNOSIS — F410 Panic disorder [episodic paroxysmal anxiety] without agoraphobia: Secondary | ICD-10-CM

## 2023-10-14 DIAGNOSIS — F332 Major depressive disorder, recurrent severe without psychotic features: Secondary | ICD-10-CM

## 2023-10-14 DIAGNOSIS — F411 Generalized anxiety disorder: Secondary | ICD-10-CM

## 2023-10-14 DIAGNOSIS — G4700 Insomnia, unspecified: Secondary | ICD-10-CM

## 2023-10-14 MED ORDER — VENLAFAXINE HCL 50 MG PO TABS: ORAL_TABLET | ORAL | 0 refills | Status: DC

## 2023-10-14 MED ORDER — GABAPENTIN 100 MG PO CAPS: 100.0000 mg | ORAL_CAPSULE | Freq: Three times a day (TID) | ORAL | 0 refills | Status: DC

## 2023-10-14 MED ORDER — PAROXETINE HCL 10 MG PO TABS: 10.0000 mg | ORAL_TABLET | Freq: Every day | ORAL | 0 refills | Status: DC

## 2023-10-14 MED ORDER — ARIPIPRAZOLE 10 MG PO TABS: 10.0000 mg | ORAL_TABLET | Freq: Every day | ORAL | 1 refills | Status: DC

## 2023-10-14 MED ORDER — PROPRANOLOL HCL 10 MG PO TABS: 10.0000 mg | ORAL_TABLET | Freq: Three times a day (TID) | ORAL | 1 refills | Status: DC

## 2023-10-14 NOTE — Progress Notes (Deleted)
 Opened in Error.

## 2023-10-14 NOTE — Patient Instructions (Addendum)
 Effexor  XR taper:  You are currently taking 150 mg daily.  Take the follow to taper off.  Week 1:  take 100 mg (2 tablets) daily.  Week 2:  Take 75 mg (1  tablet) daily.  Week 3:  Take 50 mg (1 tablet) daily.  Week 4: Take 25 mg (1/2 tablet) daily.  Week 5:  Take 25 mg (1/2 tablet) every other day ex. (Mon, Wed, Fri, Sun).  week 6 Take 25 mg (1/2 tablet) every three days ex. (Mon, [place], Sun). Then discontinue   Antidepressant discontinuation syndrome:  One of the symptoms that can occur are brain zaps which are described as electrical shock sensations or electrical current zapping brain, which can be very uncomfortable and frighting to someone.    Antidepressant withdrawal: During withdrawal from antidepressant medications, "brain zaps" are considered common symptoms to experience. It is believed that the severity and length of brain zaps may be related to whether a person discontinues "cold [place]" as opposed to tapering off of their medication.  How to stop Brain Zaps There are no known medical treatments to stop the brain zaps. Most often individuals may have to them with the understanding that in time they will eventually subside. Below are some recommendations that may help deal with the zaps.  Slower tapper:  If quit medication cold [place] may need to start taking again, and then conduct a slower/gradual taper off.  Often zaps are caused when an individual quits medication too quickly and/or from to high of a dose.    Go back on medication:  Some individuals choose to go back on medications.  You can then decide if you wish to taper off or switch to a different medication.  You provider may also choose to add an adjunct to current medications.    Prozac :  has a longer half-life.  Another way to minimize brain zaps is to transition to another drug that has a longer half-life.  Your provider could start Prozac  during the taper off of your current antidepressant, and then later stop.  Withdrawing  from the Prozac  should reduce chances of zaps.   Vitamins and Supplements:  Various supplements have been reported to reduce the severity of the brain zaps.  However, it has not been verified that these supplements actually work to stop/reduce the zaps. Vitamins and Omega-3 fatty acids:  Individuals have reported that there is a significant improvement with Omega-3 fatty acids in the form of "fish oil" Vitamin B12 It has been suggested that getting vitamins help to minimize the zaps.   A combination of Vitamin B12 and Omega-3 fatty acid has been reported to decrease the severity and frequency of zaps in some individuals.  Labs have been ordered since it has been a while since you had any.  Labs are checked at least yearly and more frequently depending on prescribed medication.  Routine blood work is an important part of assessing a patient's overall health and to rule out any condition that is not psychiatric related.  Tests such as a complete blood count, metabolic panel, lipid panel, thyroid function tests, and hemoglobin A1c test (screening for diabetes) can help a psychiatrist understand the general medical health of a patient.  Blood work can help make informed decisions about a potential differential diagnosis and help understand other factors that may be the correct reason for the presentation.  In some cases, blood work can aid in the diagnosis, medication management, and/or treatment of your a mental health. Please have  labs drawn prior to next scheduled visit.   Routine Labs checked at least yearly.  May need to be checked more often depending on prescribed medications.   CBC with Differential/Platelet    Comprehensive metabolic panel    Hemoglobin A1c    Magnesium     Ethanol    Urinalysis, Routine w reflex microscopic Urine, Clean Catch    Pregnancy, urine (females of child bearing age) POCT Urine Drug Screen:  If prescribed any controlled substance  Also recommend EKG prior to  starting psychotropic medications as a baseline and periodically after starting psychotropic medications to monitor for prolong QTc which can indicate the presence of cardiac risk factors.  Studies have shown that some psychotropic medications can increase the risk of prolong QTc.  Please have your primary care provider to do EKG at your next scheduled visit and send results if not available on Epic.      Call 911, 988, mobile crisis, or present to the nearest emergency room should you experience any suicidal/homicidal ideation, auditory/visual/hallucinations, or detrimental worsening of your mental health.  Mobile Crisis Response Teams Listed by counties in vicinity of [facility] providers [facility] [phone] [facility] [phone] [facility] [phone] [facility] [phone] [place]                * [facility] [phone]                * [facility] [phone]  [facility] [phone] [facility] [employer] [employer]  [phone] * [facility] [phone]      Online search at Psychology Today.  Enter the type of therapist looking for and the area you are in and should pull up therapist in your area.    Here are a List of outpatient providers that offer counseling/therapy virtually some also offer in person:    [facility] Phone: [phone] Physical Address:  [address], [address] [place], [place]  [address]  Outpatient Services Life can be a challenge for us  all. [facility] outpatient services offer a caring and experienced team of professionals who help people take the first step, which is often the most difficult. Together, we develop a well-defined and customized plan for  each person that meets the individual's needs and goals. Each plan includes evidence-based practices as proven strategies that work. From board-certified psychiatrists, registered nurses, therapists, and outpatient office administrative professionals--all care and want to help you and your loved ones in every way possible to ensure you succeed.  Open Access:   One way we ensure we get people the help they need when they request is is through Open Access. This service encourages individuals who are in dire need of our services and are new to [facility] to simply walk in or call us  for virtual options, Monday through Friday between 8 a.m. and 3 p.m. On the same day of contact, if the individual has time to do so, he/she/they will complete patient registration and a comprehensive clinical assessment with a therapist. The assessment will provide treatment recommendations and the individual will leave with an appointment for the next service or a referral to the proper level of care.  While this process takes a few hours and is longer than a traditional appointment, it reduces what could otherwise be months of waiting for help or an appointment.   Telehealth Services:  [facility] telehealth services  provide a safe, secure, and easy way to connect with a therapist or mental health provider for an individual or group therapy appointment. Click here to learn more about how [facility] telehealth services provide an important treatment option. These services may be accessed from the comfort of an individual's home, or at one of [facility]'s behavioral health offices such as this one where an individual may use on-site equipment for the visit.   Telehealth Services   A SAFE, SECURE, CONVENIENT TREATMENT OPTION:  [facility] telehealth services provide you with a safe, secure, and easy way to connect with your therapist or mental health provider for an individual or group therapy appointment.  Using Engineering geologist, telehealth appointments allow you to meet with [employer], therapists, nurse practitioners, and psychiatrists from your desktop or laptop computer, cell phone, or tablet device. Telehealth visits are compliant with all Health Insurance Portability and Accountability Act (HIPAA) requirements and you can complete a telehealth visit from just about anywhere using internet or wi-fi access.  HOW DOES IT WORK?  [facility] uses the Doxy.me platform to host telehealth appointments. Prior to your scheduled visit, you will receive a direct link via text or email which will take you to your provider's online waiting room. Simply click that link at your appointment time and your provider will be notified that you've arrived. He or she will meet you online and you will complete your visit. Your provider may also have resources and information posted in his or her virtual waiting room which you may find helpful throughout your treatment.  In addition, you may receive a reminder telephone call from a [place] team member in the days leading up to your appointment. During that call, you will have an opportunity to provide important health information and medication updates which may save time during your scheduled appointment.     WHO USES TELEHEALTH SERVICES?  Telehealth services provide an alternative to in-person, face-to-face treatment for individuals receiving outpatient behavioral health services. At [facility], telehealth visits may also be used by individuals receiving Assertive Community Treatment (ACT) Team and Individual Placement and Support (IPS) services and other community-based, specialized services as needed. Telehealth services are also used for group therapy sessions, allowing people we support to connect during treatment with others who have similar experiences.       Address:  [address] [place], [place] [address] Outpatient Hours: Mon-Fri 8AM to 5PM Phone: [phone] Fax:  [phone] Offers: Behavioral Health Urgent Care [facility]) Hours: 24/7 Phone: [phone] Fax: [phone] --- Address:  [address] [place], [place] [address] Hours: Mon-Fri 8AM-5PM Phone: [phone] Fax: [phone]  Address: [address] [place], [place] [address] Hours: Outpatient: Mon-Fri 8AM to 5PM Behavioral Health Urgent Care [facility]) Hours: 24/7 Phone: [phone] Fax: [phone]  Services: Adult Services [facility] Walk-In Assessments - All Disabilities If you're a walk-in patient there is generally a wait time involved on a "first come, first served basis" otherwise contact us  beforehand to setup an appointment. If you're in crisis please use our 24-Hour Crisis Hotline for immediate access to a clinician. If this is your first time, please bring the following with you:   Insurance/Medicaid Card  ID or Social Security Card Any referral documentation Routine Outpatient Therapy Psychiatry/Med Management Substance Abuse Intensive Outpatient (SAIOP) Medication Assisted Treatment - MAT (Suboxone) Tailored Care Management (TCM) Youth Services Advanced Access Walk-In Assessments - All Disabilities Routine Outpatient Therapy Psychiatry/Med Management Tailored Care Management (TCM)   Behavioral Health Care in  [place], [place] [facility] in [place], [place] provides Integrated Behavioral Health Services to people of all ages. The Integrated Behavioral Health Program applies an evidence-based approach to integrate behavioral health into primary care. This model incorporates mental health treatment into a traditional medical visit. Our philosophical standard values comprehensive wellness for patients. The program is led by Aimee Jenkins, [facility] Director. Aimee is a Administrator, Civil Service (LCSW). Our organizational vision in implementing this program is to improve patient  wellness by serving as a Publishing rights manager for thorough healthcare management. [facility] provides high-quality care for behavioral health patients of all ages, including: Common mental health diagnoses such as Anxiety, Depression, ADD/ADHD, Bipolar, and PTSD Substance Abuse Evaluations and Counseling [employer]ARC[employer] Our Behavioral Health Clinicians [facility]) are ready to help you problem-solve through life stressors to promote healthy coping skills. Together we can identify how past challenges, such as traumatic events, are contributing to how you're functioning today. Our BHCs are happy to incorporate the principles of your value system to nurture your healing needs. We offer HIPAA-compliant trauma-informed telehealth and in-person therapy to residents of [place]  and [place]  at our [place], [place], and [place] medical centers. We look forward to meeting and working with you.  We'd love to hear from you!  OUR CONTACT INFO Address:  [address] [place], [place] [address] Hours Operation Monday 8:00AM - 5:00PM Tuesday 8:00AM - 7:00PM Wednesday 8:00AM - 5:00PM Thursday 8:00AM - 5:00PM Friday 8:00AM - 12:00PM  CALL US  P: [phone] F: [phone] Email:  [email or web address]  Facebook/Messenger:  [email or web address] , [email or web address]   Hospital doctor:  [email or web address]   Get better, faster with quality mental health care Our providers take a hands-on approach to help you see improvement at every step, no matter how severe your symptoms. Just come as you are and let us  take it from there.  Appointments in as little as 2 days Receive quick guidance and support by speaking with a licensed professional in a matter of hours.  Care for even the most severe cases We offer care for mild to severe depression, anxiety, and more --including Crisis Care for adults with elevated suicide risk.  Personalized plans unique to  you Our treatment plans are tailored to your specific needs, providing you a clear path to success.  1:1 support from start to finish Get paired with a dedicated provider to serve as your single point of contact throughout treatment.  Results-based care, built for you Your expert provider will ensure your plan is grounded in data, lending support from beginning to end. Choose psychiatry, therapy, or both.  Psychiatry When medication is necessary, our psychiatric providers get it right fast --analyzing 100+ data points to determine which treatment is likely to be most tolerable and effective for you.  Therapy Our virtual program combines cognitive and behavioral therapy with independent skill practice--all of which have been clinically proven to work for a wide range of symptoms so you can get better, and stay better.  Crisis Care A first-of-its-kind program for individuals with elevated suicide risk, Crisis Care is based on the Collaborative Assessment and Management of Suicidality (CAMS) framework--a care model that's backed by 30 years of research.  Teen Care (new) Give your teen a safe space to connect and get personal support. Our expert therapists help teens 13 and up with many common concerns--from school and peer pressure to anxiety and friendships. Psychiatry services available, if appropriate.  Virtual,  dedicated support every step of the way 1:1 Video Sessions Let your provider know how you're feeling, get to know you, and provide 1:1 support.  Anytime Messaging Get questions or concerns off your chest between video visits by messaging your provider at any time.  Interactive Lessons Learn how to integrate new thought and behavior patterns into your daily life.  Proactive Progress Tracking Complete weekly check-ins so your provider can track your progress and, if necessary, adjust your treatment and/or medication.  1:1 Video Sessions Let your provider know how you're  feeling, get to know you, and provide 1:1 support.   [facility] [employer], [place].  Address:  [address] [place], [place] [address]  Phone:  [phone] Website:  [email or web address] We are here to serve clients ages 52 - 81, who are challenged by a multitude of behavioral health concerns to include substance use disorders, and complex mental health scenarios where both medical and psychiatric conditions coexist. Moreover, as a trained Healthcare Chaplain, I seek to provide incarnation al care. The aim of " [facility]" is to be incarnation al. Therefore, to provide a holistic treatment approach to all who call upon our services.  Behavioral Health Services & Treatments Depression  Substance Use Disorders  Mood Disorders  Psychodynamic Psychotherapy  [place] Charity fundraiser) Therapy Anxiety Disorders ADHD Urine Toxicology Screening Psychiatric Medication Management  Insurance [address], [address], [address], [address] and [place], [address] and [employer], [place], [place], [place], [employer], Control and instrumentation engineer, [employer]um, Cardinal Health (UMR), UnitedHealthcare UHC  UBH, [employer] of Network    Therapist, sports Health Counselor Associate, [place], [facility], CCTP Available both in-person and online [address] [place], [place] [address]  Phone:  [phone]  Specialties and Soil scientist Anxiety Depression Coping Skills Expertise ADHD Behavioral Issues Oppositional Def[place] (ODD) School Issues Self Esteem Stress Trauma and PTSD  How are you doing? Not what you tell others, Be honest with yourself How are you really doing?Aaron Aas. Let's talk about what's really on your mind. I specialize in supporting individuals, children, and families as they navigate life's challenges and work toward healing. My background includes experience in crisis support as a first responder, which has deepened my understanding of how acute  and long-term stress impacts mental health. I use a person-centered approach, ensuring that your unique strengths, experiences, and goals guide our work together. My therapeutic style is eclectic, drawing from evidence-based modalities such as Cognitive Behavioral Therapy (CBT), Trauma-Focused Therapy, mindfulness practices, and somatic techniques to create a personalized path toward wellness.   Samuel Crock Licensed Clinical Mental Health Counselor Associate, LCMHC-A, [facility] Available both in-person and online Location:  [place] - [address], [address], [place], [place], [address] Phone: [phone] Website:  [email or web address] Hello! I'm Samuel Crock, LCMHC-A, NCC. I earned my Master's in Clinical Mental Health Counseling and a Certificate in Marriage and Family Counseling from [employer]. My experience includes roles as a Science writer for individuals on the Autism Spectrum. I gained most of my clinical experience at a children's center, collaborating with a multidisciplinary team to provide top-notch care. I'm passionate about working with children, adolescents, young adults, and families facing anxiety, depression, trauma, self-esteem issues, and relational challenges. I use play therapy and expressive art techniques when traditional talk therapy isn't effective. My approaches include cognitive-behavioral, solution-focused, trauma-focused therapies, and MATCH-ADTC (Modular Approach to Therapy for Children with Anxiety, Depression, Trauma, and Conduct problems). As your therapist,  I'll tailor support to your needs and offer at-home activities to help you achieve your goals. My focus is on fostering self-awareness, growth, and confidence in a supportive environment of hope and empowerment. Outside work, I enjoy reading, trying new foods, and spending time with loved ones. Conditions  Treated ADHD Anxiety/Phobias/Panic attacks Autism Bipolar disorder Childhood behavioral issues Couple's issues Depression Family Focus and concentration LGBTQ+ Obsessions or compulsions Postpartum or Peripartum Issues PTSD or Trauma Stress management   Willim Hart Counselor, [facility], LCASA, NCC (she, her) Available online only Location:  [place], [place] [address] Phone:  [phone] My clients can expect an environment free of judgment, full of understanding and empathy, where they can face the challenges that they have in a supportive environment. My personal background allows me to look at client issues from a different view and offer real world healing that meets the client where they are. My client's come from various backgrounds, but all seeking to better understand themselves and a better way of coping. Because I'm seeing clients virtually, they feel freer to share more intimate and personal concerns that might be harder to talk about in person. Top Specialties LGBTQ+ Sex Therapy Expertise Addiction ADHD Anxiety Behavioral Issues Bipolar Disorder Body Positivity Borderline Personality (BPD) Cancer Child Chronic Pain Coping Skills Depression Life Transitions Marital and Premarital Obesity Open Relationships Non-Monogamy Parenting Peer Relationships Relationship Issues Self Esteem Sex-Positive, Kink Allied Sexual Abuse Sexual Addiction Stress Trauma and PTSD Weight Loss   [employer] Professional, MDiv, Med Available online only [address] [place], [place] [address] Phone:  [phone] Website:  [email or web address] Welcome! My name is Brewing technologist Scales, and I am passionate about helping individuals navigate life's challenges, develop resilience, and unlock their full potential. With over a decade of experience in counseling, ministry, and coaching, I provide a holistic approach to personal and spiritual  development, drawing on my diverse professional background and extensive training. Administrator Spirituality Education and Learning Disabilities Mood Disorders Expertise ADHD Anxiety Pension scheme manager Counseling Child Coping Skills Depression Family Conflict First Responders Grief Intellectual Disability Life Coaching Life Transitions Marital and Premarital Parenting Peer Relationships Relationship Issues School Issues Self Esteem Sexual Abuse Teen Violence Trauma and PTSD   Nadene Aures, MSW, LCSW, [facility] [address] [place], [place] [address] Office [phone] Fax [phone] Sometimes in life trying situations occur that may be difficult to handle alone. My goal is to help you successfully manage these challenges so that you can experience a more balanced and fulfilling life. I offer an eclectic approach to therapy, which includes solution-focused, reality-based, person-centered approaches. I will assist you in breaking the cycle of negative thinking and behaviors. If you are in need of support or guidance in order to make your life more manageable, I welcome the opportunity to assist you in doing so. My goal is to empower you to live a life of personal growth and positive well-being. Please call [phone] or email [email or web address] for an individual and family therapy or consultation today.   Contact Details  Locate Us  at [address], [place], [place] [address] [address] [place], [place] [address]  We are a full telehealth service company as we do not do in person visits.  Message or Call Us  at  Email:  [email or web address]   Phone: [phone]  FAX: [phone]  Who We Are [facility] is a leading provider of personalized mental health care in [place], [place] ,  dedicated to fostering healing and growth in a safe, compassionate environment. Our team of experienced professionals offers  innovative solutions tailored to meet the unique needs of each individual, helping them overcome challenges and improve their overall quality of life. We accept most major insurances.  You Are Our Top Priority  Mission Statement [facility] offers personalized mental health care in a safe and compassionate environment. Our team provides effective treatments and innovative solutions that empower clients to overcome challenges and improve their quality of life.  Vision Statement [facility] aims to redefine mental health care standards through excellence, compassion, and innovation. We aim to make mental health care accessible and destigmatized, ensuring everyone receives the necessary support to thrive.  Services: Anxiety Depression Bipolar disorder Personality Disorder PTSD ADHD Schizophrenia Mood disorder Insomnia Neuropsychological Testing for ADHD and Other Disorders  Comprehensive neuropsychological testing for a better tomorrow Understanding Neuropsychological Testing Neuropsychological testing is a specialized evaluation method used to assess cognitive functions, behaviors, and mental processing abilities. These tests are essential for diagnosing and managing conditions such as Attention-Deficit/Hyperactivity Disorder (ADHD), learning disabilities, autism spectrum disorders, mood disorders, and other neurological or psychological conditions. Unlike traditional assessments, neuropsychological testing provides objective, measurable data about an individual's cognitive abilities. This helps clinicians develop personalized treatment plans that address specific challenges and improve daily functioning.  Why Neuropsychological Testing is Important for ADHD ADHD is a complex neurodevelopmental disorder that affects attention, impulsivity, and executive function. Because its symptoms often overlap with other conditions, neuropsychological testing helps differentiate  ADHD from other disorders, ensuring an accurate diagnosis and appropriate intervention. Through comprehensive testing, clinicians can evaluate:  Attention and Focus - Identifying difficulties in sustained and selective attention Executive Functioning - Assessing skills such as impulse control, problem-solving, and organization Memory and Processing Speed - Measuring how quickly and efficiently information is understood and retained Behavioral and Emotional Regulation - Understanding mood-related symptoms that may be associated with ADHD Key Neuropsychological Tests Neuropsychological assessments typically include a combination of standardized tests that evaluate different cognitive domains. Some of the most commonly used tests include:  Continuous Performance Test (CPT) Measures sustained attention and response control. Helps identify inattention, impulsivity, and distractibility. Stroop Test Assesses cognitive flexibility and processing speed. Evaluates an individual's ability to control automatic responses and focus on specific tasks. [place]  Card Sorting Test (WCST) Measures executive function, problem-solving, and cognitive flexibility. Helps assess an individual's ability to adapt to changing rules and instructions. Trail Making Test (TMT) Assesses visual attention, processing speed, and task-switching abilities. Commonly used to detect cognitive impairments in ADHD and other disorders. N-Back Test Evaluates working memory and attentional capacity. Helps measure an individual's ability to hold and manipulate information over short periods.  Neuropsychological Testing for Other Disorders Beyond ADHD, neuropsychological testing is a crucial tool for diagnosing and managing a wide range of neurological and psychological conditions. These assessments can help identify cognitive deficits and guide targeted interventions for various disorders, including: Learning Disabilities Helps  assess difficulties in reading, writing, or mathematics Identifies underlying cognitive challenges that may be affecting academic performance Autism Spectrum Disorder (ASD) Evaluates cognitive flexibility, attention, and executive functioning Helps determine the presence of social and communication deficits Mood Disorders (Depression, Anxiety, Bipolar Disorder) Identifies cognitive impairments related to emotional regulation and stress response Assesses memory, attention, and problem-solving skills affected by mood disorders Traumatic Brain Injury (TBI) and Concussions Measures cognitive changes due to brain injuries Helps monitor recovery progress and guide rehabilitation efforts Dementia and Neurodegenerative Disorders Evaluates memory, processing  speed, and executive function in conditions like Alzheimer's and Parkinson's disease Assists in early detection and progression monitoring  Benefits of Neuropsychological Testing Accurate Diagnosis - Differentiates ADHD from other conditions with similar symptoms, such as anxiety or learning disabilities Personalized Treatment Planning - Identifies cognitive strengths and weaknesses to tailor individualized intervention Tracking Progress - Enables clinicians to monitor changes over time and assess the effectiveness of treatments or medication Guiding Educational and Workplace Accommodations - Provides data to support school or workplace modifications for individuals struggling with cognitive challenges  Who Should Consider Neuropsychological Testing? Individuals experiencing difficulties with attention, memory, problem-solving, or emotional regulation may benefit from neuropsychological testing. This assessment is ideal for: Children and adults with suspected ADHD or learning disabilities Individuals with behavioral or emotional challenges affecting daily life Those recovering from neurological injuries or illnesses impacting cognitive  function Anyone seeking a deeper understanding of their cognitive abilities to optimize performance in school, work, or daily activities  What to Expect During a Neuropsychological Evaluation The testing process typically includes: Clinical Interview - Gathering medical and developmental history Standardized Testing - Completing various cognitive tasks on a computer or paper Behavioral Assessments - Using rating scales and questionnaires completed by the individual and/or caregivers Comprehensive Report - A detailed breakdown of strengths, weaknesses, and personalized recommendations    Offers Virtual Therapy  [address] [place], [place] [address]  Phone:  [phone] Fax: [phone]  [facility] Out[facility] Program (OP) An Outpatient Treatment Program, or OP, is a service for individuals seeking support for substance abuse or mental health concerns who do not require frequent or intense support or safety monitoring. This level is appropriate for people with less severe disorders, or as a step-down from more intensive services. At [facility], OP is available for individuals aged 26 and above. It consists of basic treatment services offered in individual and group format and totals to less than 9 hours a week. Both virtual and in-person options for attending are available. OP is conducted by treatment professionals licensed to serve individuals with mental health and substance use disorders within [place] . OP helps individuals address a broad range of psychological and interpersonal challenges including: Substance Related Disorders Behavioral Addictions Anxiety Depression Trauma and Stressor Related Issues PTSD Parenting and Family Issues Relationship Issues Stress Self-Esteem Bipolar Disorders Life Transitions Personality Disorders ADHD Grief and Loss  *[facility] is proud to offer specialized treatment services on an outpatient  level including, EMDR, Marriage/Family Counseling and Christian Counseling  We currently accept all major insurance, including: Medicaid,Uninsured, Blue Cross Blue Shield, [address], [place], [employer], [place], [employer], [employer], [employer], [employer] [facility] Phone:  [phone] Website:  [email or web address]           How to get started with virtual therapy covered by Medicaid: Medicaid-covered members can call our Admissions Team 24/7 or fill out our online form to learn about their plan's specific benefits and get started with treatment. Once we verify your Medicaid benefits, our Clinical Team will conduct a thorough mental health assessment to create your personalized treatment plan. Medicaid members can get started with their personalized treatment plan (which includes curated groups, individual therapy, and family therapy) in as little as 24 hours. Call [phone]  What we Treat:  Anxiety Treatment for Teens and Adults  Our therapists specialize in cognitive behavioral therapy, a leading anxiety treatment, to provide evidence-based mental healthcare for teens and adults  dealing with anxiety disorders. Fill out the short form below or call us  directly to start healing from anxiety today with [facility].  Depression Treatment for Teens and Adults Depression affects millions of people worldwide, but healing is possible with evidence-based treatment.   Trauma Treatment for Teens and Adults After surviving trauma, building connections and receiving trauma-informed care are critical for long-lasting healing. That's why [facility] offers trauma-informed therapy in individual and group sessions.   Self-Harm Treatment for Teens and Adults Self-harm is often linked to serious mental health issues, which is why understanding the root of self-harm is key to long-lasting recovery.   Suicidal ideation Passive  suicidal ideation, chronic suicidal ideation, previous suicide attempt  Substance Use Disorders Treatment for Teens and Adults:   Alcohol, marijuana, prescription drugs, opioids, amphetamines, cocaine, inhalants, hallucinogens, nicotine Understanding the mental health roots of substance use disorders (SUD) is key to long-lasting recovery.
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
## 2023-10-14 NOTE — Telephone Encounter (Signed)
 Pt called in stating that once she got home and checked her medication she has tried  propranolol  (INDERAL ) 10 MG tablet in the past she felt light headed and her heart was racing, wanting to know if there is any alternative. Please advise pt seen today

## 2023-10-14 NOTE — Progress Notes (Signed)
 Addendum to today's office visit 10/14/2023 Latoya Mora called office once she was home and reports she has done propranolol  in the past and caused palpations.  Called left voice message would discontinue the Propranolol  and order Gabapentin  100 mg TID.  Also informed that referral has also been sent for TMS (Treatment resistance depression and anxiety).   Meds ordered this encounter  Medications   venlafaxine  (EFFEXOR ) 50 MG tablet    Sig: Effexor  XR taper:  Take the follow to taper off.  Week 1:  take 100 mg (2 tablets) daily.  Week 2:  Take 75 mg (1  tablet) daily.  Week 3:  Take 50 mg (1 tablet) daily.  Week 4: Take 25 mg (1/2 tablet) daily.  Week 5:  Take 25 mg (1/2 tablet) every other day ex. (Mon, Wed, Fri, Sun).  week 6 Take 25 mg (1/2 tablet) every three days ex. (Mon, [place], Sun). Then discontinue    Dispense:  50 tablet    Refill:  0    Supervising Provider:   Carlos Chesterfield, SYED T [[id number]]   PARoxetine  (PAXIL ) 10 MG tablet    Sig: Take 1 tablet (10 mg total) by mouth daily.    Dispense:  30 tablet    Refill:  0    Supervising Provider:   ARFEEN, SYED T [[id number]]   ARIPiprazole  (ABILIFY ) 10 MG tablet    Sig: Take 1 tablet (10 mg total) by mouth daily.    Dispense:  30 tablet    Refill:  1    Supervising Provider:   ARFEEN, SYED T [[id number]]   DISCONTD: propranolol  (INDERAL ) 10 MG tablet    Sig: Take 1 tablet (10 mg total) by mouth 3 (three) times daily.    Dispense:  90 tablet    Refill:  1    Supervising Provider:   ARFEEN, SYED T [[id number]]   gabapentin  (NEURONTIN ) 100 MG capsule    Sig: Take 1 capsule (100 mg total) by mouth 3 (three) times daily.    Dispense:  90 capsule    Refill:  0    Supervising Provider:   Eduard Grad T [[id number]]    Juandiego Kolenovic B. Davan Nawabi, NP

## 2023-10-14 NOTE — Addendum Note (Signed)
 Addended by: Arabia Nylund B on: 10/14/2023 04:15 PM   Modules accepted: Orders

## 2023-10-14 NOTE — Addendum Note (Signed)
 Addended by: Aimee Houseman A on: 10/14/2023 01:32 PM   Modules accepted: Orders

## 2023-10-15 ENCOUNTER — Other Ambulatory Visit: Payer: Self-pay | Admitting: Physician Assistant

## 2023-10-15 ENCOUNTER — Encounter: Payer: Self-pay | Admitting: Physician Assistant

## 2023-10-15 DIAGNOSIS — D509 Iron deficiency anemia, unspecified: Secondary | ICD-10-CM

## 2023-10-15 LAB — PROLACTIN: Prolactin: 8.2 ng/mL (ref 4.8–33.4)

## 2023-10-15 LAB — COMPREHENSIVE METABOLIC PANEL WITH GFR
ALT: 15 IU/L (ref 0–32)
AST: 12 IU/L (ref 0–40)
Albumin: 4 g/dL (ref 4.0–5.0)
Alkaline Phosphatase: 137 IU/L — ABNORMAL HIGH (ref 44–121)
BUN/Creatinine Ratio: 15 (ref 9–23)
BUN: 9 mg/dL (ref 6–20)
Bilirubin Total: <0.2 mg/dL (ref 0.0–1.2)
CO2: 22 mmol/L (ref 20–29)
Calcium: 8.7 mg/dL (ref 8.7–10.2)
Chloride: 103 mmol/L (ref 96–106)
Creatinine, Ser: 0.62 mg/dL (ref 0.57–1.00)
Globulin, Total: 3.5 g/dL (ref 1.5–4.5)
Glucose: 108 mg/dL — ABNORMAL HIGH (ref 70–99)
Potassium: 4.1 mmol/L (ref 3.5–5.2)
Sodium: 138 mmol/L (ref 134–144)
Total Protein: 7.5 g/dL (ref 6.0–8.5)
eGFR: 123 mL/min/{1.73_m2} (ref >59)

## 2023-10-15 LAB — CBC WITH DIFFERENTIAL/PLATELET
Basophils Absolute: 0.1 10*3/uL (ref 0.0–0.2)
Basophils Absolute: 0.1 10*3/uL (ref 0.0–0.2)
Basos: 1 %
Basos: 1 %
EOS (ABSOLUTE): 0.2 10*3/uL (ref 0.0–0.4)
EOS (ABSOLUTE): 0.2 10*3/uL (ref 0.0–0.4)
Eos: 2 %
Eos: 2 %
Hematocrit: 34.9 % (ref 34.0–46.6)
Hematocrit: 35.4 % (ref 34.0–46.6)
Hemoglobin: 10 g/dL — ABNORMAL LOW (ref 11.1–15.9)
Hemoglobin: 10 g/dL — ABNORMAL LOW (ref 11.1–15.9)
Immature Grans (Abs): 0.1 10*3/uL (ref 0.0–0.1)
Immature Grans (Abs): 0.1 10*3/uL (ref 0.0–0.1)
Immature Granulocytes: 1 %
Immature Granulocytes: 1 %
Lymphocytes Absolute: 2.8 10*3/uL (ref 0.7–3.1)
Lymphocytes Absolute: 2.8 10*3/uL (ref 0.7–3.1)
Lymphs: 31 %
Lymphs: 31 %
MCH: 19.6 pg — ABNORMAL LOW (ref 26.6–33.0)
MCH: 19.3 pg — ABNORMAL LOW (ref 26.6–33.0)
MCHC: 28.7 g/dL — ABNORMAL LOW (ref 31.5–35.7)
MCHC: 28.2 g/dL — ABNORMAL LOW (ref 31.5–35.7)
MCV: 68 fL — ABNORMAL LOW (ref 79–97)
MCV: 69 fL — ABNORMAL LOW (ref 79–97)
Monocytes Absolute: 0.4 10*3/uL (ref 0.1–0.9)
Monocytes Absolute: 0.4 10*3/uL (ref 0.1–0.9)
Monocytes: 4 %
Monocytes: 4 %
Neutrophils Absolute: 5.5 10*3/uL (ref 1.4–7.0)
Neutrophils Absolute: 5.6 10*3/uL (ref 1.4–7.0)
Neutrophils: 61 %
Neutrophils: 61 %
Platelets: 324 10*3/uL (ref 150–450)
Platelets: 335 10*3/uL (ref 150–450)
RBC: 5.11 x10E6/uL (ref 3.77–5.28)
RBC: 5.17 x10E6/uL (ref 3.77–5.28)
RDW: 17.6 % — ABNORMAL HIGH (ref 11.7–15.4)
RDW: 17.5 % — ABNORMAL HIGH (ref 11.7–15.4)
WBC: 8.9 10*3/uL (ref 3.4–10.8)
WBC: 9.1 10*3/uL (ref 3.4–10.8)

## 2023-10-15 LAB — IRON,TIBC AND FERRITIN PANEL
Ferritin: 31 ng/mL (ref 15–150)
Iron Saturation: 5 % — CL (ref 15–55)
Iron: 17 ug/dL — ABNORMAL LOW (ref 27–159)
Total Iron Binding Capacity: 332 ug/dL (ref 250–450)
UIBC: 315 ug/dL (ref 131–425)

## 2023-10-15 LAB — CMP14+EGFR
ALT: 14 IU/L (ref 0–32)
AST: 10 IU/L (ref 0–40)
Albumin: 4 g/dL (ref 4.0–5.0)
Alkaline Phosphatase: 138 IU/L — ABNORMAL HIGH (ref 44–121)
BUN/Creatinine Ratio: 15 (ref 9–23)
BUN: 9 mg/dL (ref 6–20)
Bilirubin Total: <0.2 mg/dL (ref 0.0–1.2)
CO2: 20 mmol/L (ref 20–29)
Calcium: 8.7 mg/dL (ref 8.7–10.2)
Chloride: 104 mmol/L (ref 96–106)
Creatinine, Ser: 0.59 mg/dL (ref 0.57–1.00)
Globulin, Total: 3.3 g/dL (ref 1.5–4.5)
Glucose: 108 mg/dL — ABNORMAL HIGH (ref 70–99)
Potassium: 4.1 mmol/L (ref 3.5–5.2)
Sodium: 138 mmol/L (ref 134–144)
Total Protein: 7.3 g/dL (ref 6.0–8.5)
eGFR: 124 mL/min/{1.73_m2} (ref >59)

## 2023-10-15 LAB — TSH+FREE T4
Free T4: 1.04 ng/dL (ref 0.82–1.77)
TSH: 1.58 u[IU]/mL (ref 0.450–4.500)

## 2023-10-15 LAB — LIPID PANEL
Chol/HDL Ratio: 3.8 ratio (ref 0.0–4.4)
Chol/HDL Ratio: 3.8 ratio (ref 0.0–4.4)
Cholesterol, Total: 145 mg/dL (ref 100–199)
Cholesterol, Total: 145 mg/dL (ref 100–199)
HDL: 38 mg/dL — ABNORMAL LOW (ref >39)
HDL: 38 mg/dL — ABNORMAL LOW (ref >39)
LDL Chol Calc (NIH): 90 mg/dL (ref 0–99)
LDL Chol Calc (NIH): 90 mg/dL (ref 0–99)
Triglycerides: 92 mg/dL (ref 0–149)
Triglycerides: 90 mg/dL (ref 0–149)
VLDL Cholesterol Cal: 17 mg/dL (ref 5–40)
VLDL Cholesterol Cal: 17 mg/dL (ref 5–40)

## 2023-10-15 LAB — PREGNANCY, URINE: Preg Test, Ur: NEGATIVE

## 2023-10-15 LAB — HEMOGLOBIN A1C
Est. average glucose Bld gHb Est-mCnc: 117 mg/dL
Hgb A1c MFr Bld: 5.7 % — ABNORMAL HIGH (ref 4.8–5.6)

## 2023-10-15 LAB — MAGNESIUM: Magnesium: 2.2 mg/dL (ref 1.6–2.3)

## 2023-10-15 LAB — TSH: TSH: 1.66 u[IU]/mL (ref 0.450–4.500)

## 2023-10-15 MED ORDER — IRON (FERROUS SULFATE) 325 (65 FE) MG PO TABS: 325.0000 mg | ORAL_TABLET | Freq: Every day | ORAL | 2 refills | Status: DC

## 2023-10-28 ENCOUNTER — Encounter (HOSPITAL_COMMUNITY): Payer: Self-pay | Admitting: Registered Nurse

## 2023-10-28 ENCOUNTER — Telehealth (INDEPENDENT_AMBULATORY_CARE_PROVIDER_SITE_OTHER): Admitting: Registered Nurse

## 2023-10-28 DIAGNOSIS — F410 Panic disorder [episodic paroxysmal anxiety] without agoraphobia: Secondary | ICD-10-CM | POA: Diagnosis not present

## 2023-10-28 DIAGNOSIS — F411 Generalized anxiety disorder: Secondary | ICD-10-CM | POA: Diagnosis not present

## 2023-10-28 DIAGNOSIS — F332 Major depressive disorder, recurrent severe without psychotic features: Secondary | ICD-10-CM | POA: Diagnosis not present

## 2023-10-28 MED ORDER — VENLAFAXINE HCL 50 MG PO TABS: ORAL_TABLET | ORAL | 0 refills | Status: DC

## 2023-10-28 MED ORDER — PAROXETINE HCL 10 MG PO TABS
10.0000 mg | ORAL_TABLET | Freq: Every day | ORAL | 0 refills | Status: DC
Start: 2023-10-28 — End: 2023-10-29

## 2023-10-28 MED ORDER — GABAPENTIN 100 MG PO CAPS: 100.0000 mg | ORAL_CAPSULE | Freq: Three times a day (TID) | ORAL | 0 refills | Status: DC

## 2023-10-28 MED ORDER — ARIPIPRAZOLE 10 MG PO TABS: 10.0000 mg | ORAL_TABLET | Freq: Every day | ORAL | 1 refills | Status: DC

## 2023-10-28 NOTE — Progress Notes (Signed)
 [facility] MD/PA/NP OP Progress Note  10/28/2023 11:28 AM Sude Byers  MRN:  [id number]  Virtual Visit via Video Note  I connected with Latoya Mora on 10/28/23 at 11:00 AM EDT by a video enabled telemedicine application and verified that I am speaking with the correct person using two identifiers.  Location: Patient: Home Provider: [facility] [place]   I discussed the limitations of evaluation and management by telemedicine and the availability of in person appointments. The patient expressed understanding and agreed to proceed.    I discussed the assessment and treatment plan with the patient. The patient was provided an opportunity to ask questions and all were answered. The patient agreed with the plan and demonstrated an understanding of the instructions.   The patient was advised to call back or seek an in-person evaluation if the symptoms worsen or if the condition fails to improve as anticipated.  I provided 20 minutes of non-face-to-face time during this encounter.   Humberto Magnus, NP    Chief Complaint:  Chief Complaint  Patient presents with   Follow-up    Medication management   HPI: Latoya Mora 28 y.o. female presents today for medication management follow up.  She is seen via virtual video visit by this provider, and chart reviewed on 10/28/23.  Her psychiatric history is significant for major depressive disorder recurrent and general anxiety disorder with panic attacks.  Her mental health is currently managed with Effexor  XR 150 mg daily and Abilify  10 mg daily.  During her last visit 10/14/23 medication changes were made she was to taper off of the Effexor , start gabapentin  100 mg 3 times a day, and Paxil  10 mg daily, and continue her Abilify  10 mg daily.  She reports lapse of medical insurance and was unable to get her medications prescribed so she has not started the taper of the Effexor  and she continues to take medication regimen prior to the changes.  She reports  there has been no improvement in depression or anxiety but denies suicidal/self-harm/homicidal ideations, psychosis, and paranoia.  She reports she has been eating without any difficulty and sleeping with Excedrin that is managed by her primary care provider.  She reports that the issues with her insurance should be resolved this week and should be able to pick up her medications from the pharmacy.  Medications were reordered just in case there were issues from her not being able to pick them up from previous order.  Recommended the following: When she is able to pick up her medications from the pharmacy she is to start the Effexor  taper:  Week 1:  take 100 mg (2 tablets) daily.  Week 2:  Take 75 mg (1  tablet) daily.  Week 3:  Take 50 mg (1 tablet) daily.  Week 4: Take 25 mg (1/2 tablet) daily.  Week 5:  Take 25 mg (1/2 tablet) every other day ex. (Mon, Wed, Fri, Sun).  week 6 Take 25 mg (1/2 tablet) every three days ex. (Mon, [place], Sun). Then discontinue, continue Abilify  10 mg daily, start Paxil  10 mg daily, and gabapentin  100 mg 3 times daily. She voices understanding with information being given to her today and is agreeable to recommendations.    Visit Diagnosis:    ICD-10-CM   1. Severe episode of recurrent major depressive disorder, without psychotic features (HCC)  F33.2 PARoxetine  (PAXIL ) 10 MG tablet    ARIPiprazole  (ABILIFY ) 10 MG tablet    venlafaxine  (EFFEXOR ) 50 MG tablet    DISCONTINUED: venlafaxine  (  EFFEXOR ) 50 MG tablet    2. Generalized anxiety disorder with panic attacks  F41.1 gabapentin  (NEURONTIN ) 100 MG capsule   F41.0 PARoxetine  (PAXIL ) 10 MG tablet    venlafaxine  (EFFEXOR ) 50 MG tablet    DISCONTINUED: venlafaxine  (EFFEXOR ) 50 MG tablet      Past Psychiatric History: Major depressive disorder, general anxiety disorder  Prior psychotropic medications:  Prozac , Zoloft , Cymbalta, Celexa, Wellbutrin , Lexapro , Buspar , Vistaril , Xanax, and Klonopin .   Past Medical History:   Past Medical History:  Diagnosis Date   Allergy    Anemia    during pregnancies   Anxiety    Anxiety and depression 09/18/2016   effexor  150mg >11/04/17 wants to wean off  Significant anxiety and depression, postpartum--consider meds at pregnancy end or immed. Pp, safe with breast feeding only     Depression    Elevated liver enzymes    with both pregnancies, no problems after pregnancies   Frequent headaches    no longer having frequent headaches   History of pre-eclampsia    Morbid obesity (HCC) 04/03/2017   PCOS (polycystic ovarian syndrome)     Past Surgical History:  Procedure Laterality Date   LAPAROSCOPIC SALPINGO OOPHERECTOMY Left 02/23/2020   Procedure: LAPAROSCOPIC SALPINGO OOPHORECTOMY;  Surgeon: Julianne Octave, MD;  Location: [facility] OR;  Service: Gynecology;  Laterality: Left;   LUMBAR LAMINECTOMY/DECOMPRESSION MICRODISCECTOMY N/A 04/19/2023   Procedure: LUMBAR LAMINECTOMY/DECOMPRESSION MICRODISCECTOMY  Lumbar Four - Five;  Surgeon: Dawley, Colby Daub, DO;  Location: [facility] OR;  Service: Neurosurgery;  Laterality: N/A;   TOOTH EXTRACTION     local anesthestic only    Family Psychiatric History: See below in family history  Family History:  Family History  Problem Relation Age of Onset   Depression Mother    Anxiety disorder Mother    Heart disease Mother    Skin cancer Mother    Scoliosis Mother    Alcohol abuse Father    Bipolar disorder Father    Depression Father    Osteochondroma Father    COPD Maternal Grandmother    Scoliosis Maternal Grandmother    COPD Maternal Grandfather    Skin cancer Maternal Grandfather    Parkinson's disease Maternal Grandfather    Arthritis Paternal Grandmother    Diabetes Paternal Grandmother    Bipolar disorder Paternal Grandfather    Osteochondroma Sister    Osteochondroma Brother    Cancer Neg Hx     Social History:  Social History   Socioeconomic History   Marital status: Married    Spouse name: Adiel Alpert   Number  of children: Not on file   Years of education: Not on file   Highest education level: Not on file  Occupational History   Not on file  Tobacco Use   Smoking status: Never   Smokeless tobacco: Never  Vaping Use   Vaping status: Never Used  Substance and Sexual Activity   Alcohol use: Not Currently   Drug use: No   Sexual activity: Yes    Birth control/protection: None  Other Topics Concern   Not on file  Social History Narrative   Not on file   Social Drivers of Health   Financial Resource Strain: Low Risk  (05/21/2018)   Overall Financial Resource Strain (CARDIA)    Difficulty of Paying Living Expenses: Not hard at all  Food Insecurity: No Food Insecurity (05/21/2018)   Hunger Vital Sign    Worried About Running Out of Food in the Last Year: Never true  Ran Out of Food in the Last Year: Never true  Transportation Needs: Unknown (05/21/2018)   PRAPARE - Administrator, Civil Service (Medical): No    Lack of Transportation (Non-Medical): Not on file  Physical Activity: Not on file  Stress: Stress Concern Present (05/21/2018)   [employer] of Occupational Health - Occupational Stress Questionnaire    Feeling of Stress : To some extent  Social Connections: Not on file    Allergies:  Allergies  Allergen Reactions   Etodolac Nausea Only   Meloxicam Other (See Comments)    Abdominal pain   Flexeril [Cyclobenzaprine] Nausea Only   Nsaids Other (See Comments)    Stomach pain. Pt ok with Motrin    Prozac  [Fluoxetine  Hcl] Other (See Comments)    Felt bad/awful   Hydroxyzine  Nausea Only   Tizanidine  Palpitations    Metabolic Disorder Labs: Reviewed with patient Lab Results  Component Value Date   HGBA1C 5.7 (H) 10/14/2023   Lab Results  Component Value Date   PROLACTIN 8.2 10/14/2023   Lab Results  Component Value Date   CHOL 145 10/14/2023   TRIG 90 10/14/2023   HDL 38 (L) 10/14/2023   CHOLHDL 3.8 10/14/2023   LDLCALC 90 10/14/2023    LDLCALC 90 10/14/2023   Lab Results  Component Value Date   TSH 1.660 10/14/2023   TSH 1.580 10/14/2023    Current Medications: Current Outpatient Medications  Medication Sig Dispense Refill   acetaminophen  (TYLENOL ) 500 MG tablet Take 1,000 mg by mouth every 6 (six) hours as needed (general pain).     ARIPiprazole  (ABILIFY ) 10 MG tablet Take 1 tablet (10 mg total) by mouth daily. 30 tablet 1   baclofen  (LIORESAL ) 10 MG tablet Take 1 tablet (10 mg total) by mouth 3 (three) times daily. 30 each 0   diphenhydrAMINE -APAP, sleep, (EXCEDRIN PM PO) Take 2 tablets by mouth at bedtime as needed (general pain).     gabapentin  (NEURONTIN ) 100 MG capsule Take 1 capsule (100 mg total) by mouth 3 (three) times daily. 90 capsule 0   Iron , Ferrous Sulfate , 325 (65 Fe) MG TABS Take 325 mg by mouth daily. 30 tablet 2   PARoxetine  (PAXIL ) 10 MG tablet Take 1 tablet (10 mg total) by mouth daily. 30 tablet 0   venlafaxine  (EFFEXOR ) 50 MG tablet Effexor  XR taper:  Take the follow to taper off.  Week 1:  take 100 mg (2 tablets) daily.  Week 2:  Take 75 mg (1  tablet) daily.  Week 3:  Take 50 mg (1 tablet) daily.  Week 4: Take 25 mg (1/2 tablet) daily.  Week 5:  Take 25 mg (1/2 tablet) every other day ex. (Mon, Wed, Fri, Sun).  Week 6 Take 25 mg (1/2 tablet) every three days ex. (Mon, [place], Sun). Then discontinue 50 tablet 0   No current facility-administered medications for this visit.     Musculoskeletal: Strength & Muscle Tone:  Unable to assess via virtual visit Gait & Station:  Unable to assess via virtual visit Patient leans: N/A  Psychiatric Specialty Exam: Review of Systems  Constitutional:        No other complaints voiced  Psychiatric/Behavioral:  Positive for dysphoric mood. Negative for hallucinations, self-injury, sleep disturbance and suicidal ideas. The patient is nervous/anxious.   All other systems reviewed and are negative.   Last menstrual period 10/08/2023, unknown if currently  breastfeeding.There is no height or weight on file to calculate BMI.  General Appearance: Casual  Eye Contact:  Good  Speech:  Clear and Coherent and Normal Rate  Volume:  Normal  Mood:  Anxious  Affect:  Congruent  Thought Process:  Coherent, Goal Directed, and Descriptions of Associations: Intact  Orientation:  Full (Time, Place, and Person)  Thought Content: WDL and Logical   Suicidal Thoughts:  No  Homicidal Thoughts:  No  Memory:  Immediate;   Good Recent;   Good Remote;   Good  Judgement:  Intact  Insight:  Present  Psychomotor Activity:  Normal  Concentration:  Concentration: Good and Attention Span: Good  Recall:  Good  Fund of Knowledge: Good  Language: Good  Akathisia:  No  Handed:  Right  AIMS (if indicated): not done  Assets:  Communication Skills Desire for Improvement Housing Physical Health Resilience Transportation  ADL's:  Intact  Cognition: WNL  Sleep:  Good   Screenings: AIMS    Flowsheet Row Office Visit from 10/14/2023 in [facility] [place]  AIMS Total Score 0      GAD-7    Flowsheet Row Office Visit from 10/14/2023 in [facility] [place] Office Visit from 08/09/2023 in [facility] Office Visit from 07/12/2023 in [facility] [place] [facility] Office Visit from 11/09/2021 in [facility]  Total GAD-7 Score 20 17 18 19       PHQ2-9    Flowsheet Row Office Visit from 10/14/2023 in [facility] [place] Office Visit from 08/09/2023 in [facility] Office Visit from 07/12/2023 in [facility] Office Visit from 11/09/2021 in [facility] [facility] Office Visit from 01/12/2020 in [facility] [place]  PHQ-2 Total Score 5 6 6 3  0  PHQ-9 Total Score 17 22 22 13  --      Flowsheet Row ED from 05/17/2023 in [facility] at [facility] ED to Hosp-Admission (Discharged) from 04/18/2023 in [facility] ED from 04/12/2023 in [facility] [place]  C-SSRS RISK CATEGORY No Risk No Risk No Risk        Assessment and Plan:  Assessment: Patient seen and examined as noted above. Summary: Today Latoya Mora appears to be doing fairly well.  However, she does report that she has not been able to start her medications related to lapse of insurance.  Reports that issues should be resolved sometime this week and she will be able to pick up her medications.  Reporting no changes in depression/anxiety while she is currently continue to take medications prior to her initial visit with this provider.  She does deny suicidal/self-harm/homicidal ideations, psychosis, paranoia, and abnormal movements  During visit she is dressed appropriate for age and weather.  She is comfortably in view of camera with no noted distress.  She is alert/oriented x 4, calm/cooperative and mood is congruent with affect.  She spoke in a clear tone at moderate volume, and normal pace, with good eye contact.  Her thought process is coherent, relevant, and there is no indication that she is currently responding to internal/external stimuli or experiencing delusional thought content.    1. Severe episode of recurrent major depressive disorder, without psychotic features (HCC) (Primary) - PARoxetine  (PAXIL ) 10 MG tablet; Take 1 tablet (10 mg total) by mouth daily.  Dispense: 30 tablet; Refill: 0 - ARIPiprazole  (ABILIFY ) 10 MG tablet; Take 1 tablet (10 mg total) by mouth  daily.  Dispense: 30 tablet; Refill: 1 - venlafaxine  (EFFEXOR ) 50 MG tablet; Effexor  XR taper:  Take the follow to taper off.  Week 1:  take 100 mg (2 tablets) daily.  Week 2:  Take 75 mg (1  tablet) daily.  Week 3:  Take 50 mg (1 tablet) daily.  Week 4: Take 25 mg (1/2 tablet) daily.  Week 5:  Take 25 mg (1/2 tablet) every  other day ex. (Mon, Wed, Fri, Sun).  Week 6 Take 25 mg (1/2 tablet) every three days ex. (Mon, [place], Sun). Then discontinue  Dispense: 50 tablet; Refill: 0  2. Generalized anxiety disorder with panic attacks - gabapentin  (NEURONTIN ) 100 MG capsule; Take 1 capsule (100 mg total) by mouth 3 (three) times daily.  Dispense: 90 capsule; Refill: 0 - PARoxetine  (PAXIL ) 10 MG tablet; Take 1 tablet (10 mg total) by mouth daily.  Dispense: 30 tablet; Refill: 0 - venlafaxine  (EFFEXOR ) 50 MG tablet; Effexor  XR taper:  Take the follow to taper off.  Week 1:  take 100 mg (2 tablets) daily.  Week 2:  Take 75 mg (1  tablet) daily.  Week 3:  Take 50 mg (1 tablet) daily.  Week 4: Take 25 mg (1/2 tablet) daily.  Week 5:  Take 25 mg (1/2 tablet) every other day ex. (Mon, Wed, Fri, Sun).  Week 6 Take 25 mg (1/2 tablet) every three days ex. (Mon, [place], Sun). Then discontinue  Dispense: 50 tablet; Refill: 0  Plan: Medications: Meds ordered this encounter  Medications   gabapentin  (NEURONTIN ) 100 MG capsule    Sig: Take 1 capsule (100 mg total) by mouth 3 (three) times daily.    Dispense:  90 capsule    Refill:  0    Supervising Provider:   ARFEEN, SYED T [[id number]]   PARoxetine  (PAXIL ) 10 MG tablet    Sig: Take 1 tablet (10 mg total) by mouth daily.    Dispense:  30 tablet    Refill:  0    Supervising Provider:   ARFEEN, SYED T [[id number]]   DISCONTD: venlafaxine  (EFFEXOR ) 50 MG tablet    Sig: Effexor  XR taper:  Take the follow to taper off.  Week 1:  take 100 mg (2 tablets) daily.  Week 2:  Take 75 mg (1  tablet) daily.  Week 3:  Take 50 mg (1 tablet) daily.  Week 4: Take 25 mg (1/2 tablet) daily.  Week 5:  Take 25 mg (1/2 tablet) every other day ex. (Mon, Wed, Fri, Sun).  week 6 Take 25 mg (1/2 tablet) every three days ex. (Mon, [place], Sun). Then discontinue    Dispense:  50 tablet    Refill:  0    Supervising Provider:   Carlos Chesterfield, SYED T [[id number]]   ARIPiprazole  (ABILIFY ) 10 MG tablet    Sig: Take 1 tablet (10 mg total)  by mouth daily.    Dispense:  30 tablet    Refill:  1    Supervising Provider:   ARFEEN, SYED T [[id number]]   venlafaxine  (EFFEXOR ) 50 MG tablet    Sig: Effexor  XR taper:  Take the follow to taper off.  Week 1:  take 100 mg (2 tablets) daily.  Week 2:  Take 75 mg (1  tablet) daily.  Week 3:  Take 50 mg (1 tablet) daily.  Week 4: Take 25 mg (1/2 tablet) daily.  Week 5:  Take 25 mg (1/2 tablet) every other day ex. (Mon, Wed, Fri, Sun).  Week 6 Take  25 mg (1/2 tablet) every three days ex. (Mon, [place], Sun). Then discontinue    Dispense:  50 tablet    Refill:  0    Supervising Provider:   Eduard Grad T [[id number]]    Labs:  Reviewed. Not indicated at this time  Other:  Keep scheduled appointment with Fayne Hoover on 01/07/2024 at 2:00 PM for counseling/therapy.   Latoya Mora is instructed to call [phone], mobile crisis, or present to the nearest emergency room should she experience any suicidal/homicidal ideation, auditory/visual/hallucinations, or detrimental worsening of her mental health condition.   Latoya Mora has participated in the development of this treatment plan and verbalized her agreement with plan as listed.  Follow Up: Return in 2 weeks for medication management Call in the interim for any side-effects, decompensation, questions, or problems  Collaboration of Care: Collaboration of Care: Medication Management AEB Medication refill  Patient/Guardian was advised Release of Information must be obtained prior to any record release in order to collaborate their care with an outside provider. Patient/Guardian was advised if they have not already done so to contact the registration department to sign all necessary forms in order for us  to release information regarding their care.   Consent: Patient/Guardian gives verbal consent for treatment and assignment of benefits for services provided during this visit. Patient/Guardian expressed understanding and agreed to proceed.    Latoya Ibsen,  NP 10/28/2023, 11:28 AM

## 2023-10-29 ENCOUNTER — Encounter: Payer: Self-pay | Admitting: Obstetrics & Gynecology

## 2023-10-29 ENCOUNTER — Ambulatory Visit: Admitting: Obstetrics & Gynecology

## 2023-10-29 ENCOUNTER — Other Ambulatory Visit (HOSPITAL_COMMUNITY)
Admission: RE | Admit: 2023-10-29 | Discharge: 2023-10-29 | Disposition: A | Discharge status: Home / Self Care | Source: Ambulatory Visit | Attending: Obstetrics & Gynecology | Admitting: Obstetrics & Gynecology

## 2023-10-29 VITALS — BP 140/88 | HR 86 | Wt 341.0 lb

## 2023-10-29 DIAGNOSIS — Z01419 Encounter for gynecological examination (general) (routine) without abnormal findings: Secondary | ICD-10-CM | POA: Diagnosis present

## 2023-10-29 DIAGNOSIS — N6323 Unspecified lump in the left breast, lower outer quadrant: Secondary | ICD-10-CM | POA: Diagnosis not present

## 2023-10-29 NOTE — Progress Notes (Signed)
 GYNECOLOGY ANNUAL PREVENTATIVE CARE ENCOUNTER NOTE  History:     Latoya Mora is a 28 y.o. G60P2002 female here for a routine annual gynecologic exam.  Current complaints: left breast lump palpated by patient in lower outer quadrant of left breast. Only able to feel it while standing, but felt it last years, and it has gotten bigger. No drainage or other lumps.   Denies abnormal vaginal bleeding, discharge, pelvic pain, problems with intercourse or other gynecologic concerns.    Gynecologic History Patient's last menstrual period was 10/08/2023 (exact date). Contraception: none Last Pap: 10/23/2017. Result was normal with negative HPV  Obstetric History OB History  Gravida Para Term Preterm AB Living  2 2 2  0 0 2  SAB IAB Ectopic Multiple Live Births  0 0 0 0 2    # Outcome Date GA Lbr Len/2nd Weight Sex Type Anes PTL Lv  2 Term 08/04/19 [redacted]w[redacted]d 03:40 / 00:03 6 lb 13.9 oz (3.115 kg) M Vag-Spont EPI  LIV  1 Term 05/28/18 [redacted]w[redacted]d 02:00 / 00:13 7 lb 6.8 oz (3.368 kg) M Vag-Spont EPI  LIV    Past Medical History:  Diagnosis Date   Allergy    Anemia    during pregnancies   Anxiety    Anxiety and depression 09/18/2016   effexor  150mg >11/04/17 wants to wean off  Significant anxiety and depression, postpartum--consider meds at pregnancy end or immed. Pp, safe with breast feeding only     Depression    Elevated liver enzymes    with both pregnancies, no problems after pregnancies   Frequent headaches    no longer having frequent headaches   History of pre-eclampsia    Morbid obesity (HCC) 04/03/2017   PCOS (polycystic ovarian syndrome)     Past Surgical History:  Procedure Laterality Date   LAPAROSCOPIC SALPINGO OOPHERECTOMY Left 02/23/2020   Procedure: LAPAROSCOPIC SALPINGO OOPHORECTOMY;  Surgeon: Julianne Octave, MD;  Location: [facility];  Service: Gynecology;  Laterality: Left;   LUMBAR LAMINECTOMY/DECOMPRESSION MICRODISCECTOMY N/A 04/19/2023   Procedure: LUMBAR  LAMINECTOMY/DECOMPRESSION MICRODISCECTOMY  Lumbar Four - Five;  Surgeon: Dawley, Colby Daub, DO;  Location: [facility] OR;  Service: Neurosurgery;  Laterality: N/A;   TOOTH EXTRACTION     local anesthestic only    Current Outpatient Medications on File Prior to Visit  Medication Sig Dispense Refill   acetaminophen  (TYLENOL ) 500 MG tablet Take 1,000 mg by mouth every 6 (six) hours as needed (general pain).     ARIPiprazole  (ABILIFY ) 10 MG tablet Take 1 tablet (10 mg total) by mouth daily. 30 tablet 1   baclofen  (LIORESAL ) 10 MG tablet Take 1 tablet (10 mg total) by mouth 3 (three) times daily. 30 each 0   diphenhydrAMINE -APAP, sleep, (EXCEDRIN PM PO) Take 2 tablets by mouth at bedtime as needed (general pain).     venlafaxine  (EFFEXOR ) 50 MG tablet Effexor  XR taper:  Take the follow to taper off.  Week 1:  take 100 mg (2 tablets) daily.  Week 2:  Take 75 mg (1  tablet) daily.  Week 3:  Take 50 mg (1 tablet) daily.  Week 4: Take 25 mg (1/2 tablet) daily.  Week 5:  Take 25 mg (1/2 tablet) every other day ex. (Mon, Wed, Fri, Sun).  Week 6 Take 25 mg (1/2 tablet) every three days ex. (Mon, Mora, Sun). Then discontinue 50 tablet 0   No current facility-administered medications on file prior to visit.    Allergies  Allergen Reactions   Etodolac Nausea  Only   Meloxicam Other (See Comments)    Abdominal pain   Flexeril [Cyclobenzaprine] Nausea Only   Nsaids Other (See Comments)    Stomach pain. Pt ok with Motrin    Prozac  [Fluoxetine  Hcl] Other (See Comments)    Felt bad/awful   Hydroxyzine  Nausea Only   Tizanidine  Palpitations    Social History:  reports that she has never smoked. She has never used smokeless tobacco. She reports that she does not currently use alcohol. She reports that she does not use drugs.  Family History  Problem Relation Age of Onset   Depression Mother    Anxiety disorder Mother    Heart disease Mother    Skin cancer Mother    Scoliosis Mother    Alcohol abuse Father     Bipolar disorder Father    Depression Father    Osteochondroma Father    COPD Maternal Grandmother    Scoliosis Maternal Grandmother    COPD Maternal Grandfather    Skin cancer Maternal Grandfather    Parkinson's disease Maternal Grandfather    Arthritis Paternal Grandmother    Diabetes Paternal Grandmother    Bipolar disorder Paternal Grandfather    Osteochondroma Sister    Osteochondroma Brother    Cancer Neg Hx     The following portions of the patient's history were reviewed and updated as appropriate: allergies, current medications, past family history, past medical history, past social history, past surgical history and problem list.  Review of Systems Pertinent items noted in HPI and remainder of comprehensive ROS otherwise negative.  Physical Exam: Chaperone was Ezequiel Holm, CMA  BP (!) 140/88   Pulse 86   Wt (!) 341 lb (154.7 kg)   LMP 10/08/2023 (Exact Date)   BMI 51.85 kg/m  CONSTITUTIONAL: Well-developed, well-nourished female in no acute distress.  HENT:  Normocephalic, atraumatic, External right and left ear normal. Oropharynx is clear and moist EYES: Conjunctivae and EOM are normal. Pupils are equal, round, and reactive to light. No scleral icterus.  NECK: Normal range of motion, supple, no masses observed. SKIN: Skin is warm and dry. No rash noted. Not diaphoretic. No erythema. No pallor. MUSCULOSKELETAL: Normal range of motion. No tenderness.  No cyanosis, clubbing, or edema.  2+ distal pulses. NEUROLOGIC: Alert and oriented to person, place, and time. Normal muscle tone coordination.  PSYCHIATRIC: Normal mood and affect. Normal behavior. Normal judgment and thought content. CARDIOVASCULAR: Normal heart rate noted, regular rhythm RESPIRATORY: Clear to auscultation bilaterally. Effort and breath sounds normal, no problems with respiration noted. BREASTS: Symmetric in size. No overt masses, tenderness, skin changes, nipple drainage, or lymphadenopathy noted  bilaterally. Overall lumpiness of both breasts noted. Patient unable to palpate left breast lump while in office but points to lower, outer quadrant area.  Performed in the presence of a chaperone. ABDOMEN: Soft, obese, no distention appreciated.  No tenderness, rebound or guarding.  PELVIC: Normal appearing external genitalia and urethral meatus; normal appearing vaginal mucosa and cervix.  Normal appearing discharge.  Pap smear obtained.  Unable to palpate uterus or adnexa well secondary to habitus but no abnormal masses palpated and no tenderness on exam.  Performed in the presence of a chaperone.   Assessment and Plan:    1. Unspecified lump in the left breast, lower outer quadrant Patient is very anxious about this.  Breast imaging ordered, will follow up results and manage accordingly. - US  LIMITED ULTRASOUND INCLUDING AXILLA LEFT BREAST ; Future - MM 3D DIAGNOSTIC MAMMOGRAM BILATERAL BREAST; Future  2. Well woman exam with routine gynecological exam (Primary) - Cytology - PAP Will follow up results of pap smear and manage accordingly. Routine preventative health maintenance measures emphasized. Please refer to After Visit Summary for other counseling recommendations.      Lenoard Rad, MD, FACOG Obstetrician & Gynecologist, [facility] for [employer], [facility]

## 2023-10-30 ENCOUNTER — Ambulatory Visit
Admission: RE | Admit: 2023-10-30 | Discharge: 2023-10-30 | Disposition: A | Discharge status: Home / Self Care | Source: Ambulatory Visit | Attending: Obstetrics & Gynecology | Admitting: Obstetrics & Gynecology

## 2023-10-30 DIAGNOSIS — N6323 Unspecified lump in the left breast, lower outer quadrant: Secondary | ICD-10-CM | POA: Diagnosis present

## 2023-10-30 LAB — CYTOLOGY - PAP
Comment: NEGATIVE
Diagnosis: NEGATIVE
High risk HPV: NEGATIVE

## 2023-10-30 NOTE — Progress Notes (Signed)
 [facility] [address], [place] [address]   Clinic Day:  10/31/2023  Referring physician: Marco Severs, [place]  Patient Care Team: Grooms, Alayne Hubert as PCP - General (Physician Assistant)   ASSESSMENT & PLAN:   Assessment:  1.  Iron  deficiency anemia: - Patient seen at the request of Jearlean Mince Grooms, PA-C - 10/14/2023: Ferritin 31, saturation 5, Hb-10, MCV-68 - She has iron  deficiency anemia from heavy menstrual bleeding. - She complains of severe fatigue.  Positive for ice pica.  No prior history of transfusion.  2.  Social/family history: - She is a non-smoker.  Mother and grandmother had anemia. - No family history of malignancies.  Plan:  1.  Iron  deficiency anemia: - She complains of severe fatigue.  We talked about parenteral iron  therapy with Monoferric 1 g IV x 1. - We discussed side effects in detail including rare chance of anaphylactic reactions.  Even the 7 medications were listed on her allergies, they are mostly from intolerance. - I have recommended that she take Benadryl  25 mg and Tylenol  500 mg - 650 mg 1 to 2 hours prior to infusion. - She will return to clinic in 3 months with CBC, ferritin and iron  panel.   Orders Placed This Encounter  Procedures   CBC    Standing Status:   Future    Expected Date:   01/27/2024    Expiration Date:   10/30/2024   Ferritin    Standing Status:   Future    Expected Date:   01/27/2024    Expiration Date:   10/30/2024   Iron  and TIBC ([facility] ONLY)    Standing Status:   Future    Expected Date:   01/27/2024    Expiration Date:   10/30/2024      Hurman Maiden R Teague,acting as a scribe for Paulett Boros, MD.,have documented all relevant documentation on the behalf of Paulett Boros, MD,as directed by  Paulett Boros, MD while in the presence of Paulett Boros, MD.   I, Paulett Boros MD, have reviewed the above documentation for accuracy and completeness,  and I agree with the above.   Paulett Boros, MD   5/8/20259:17 AM  CHIEF COMPLAINT/PURPOSE OF CONSULT:   Diagnosis: Iron  deficiency anemia  Current Therapy: Monoferric  HISTORY OF PRESENT ILLNESS:   Latoya Mora is a 28 y.o. female presenting to clinic today for evaluation of iron  deficiency anemia at the request of Grooms, Jearlean Mince, [place].  Patient has a medical history of PCOS, cauda equina compression, anemia, anxiety, and depression.   She had blood work done prior to follow-up with her PCP on 10/14/23. Her CVC diff from 10/14/23 was abnormal with low HGB at 10.0, low MCV at 69, low MCH at 19.3, low MCHC at 28.2, and elevated RDW at 17.5. Ferritin and Iron  panel from the same day showed low iron  at 17, normal ferritin at 31, and severely low iron  saturation at 5.   Of note, Anaid had an US  of the left breast and diagnostic bilateral MM on 10/30/23 for a mass on the left breast. Imaging found no mammographic evidence of abnormality in the area of palpable concern, nor evidence of malignancy in either breast.   Today, she states that she is doing well overall. Her appetite level is at 100%. Her energy level is at 25%.  PAST MEDICAL HISTORY:   Past Medical History: Past Medical History:  Diagnosis Date   Allergy    Anemia    during  pregnancies   Anxiety    Anxiety and depression 09/18/2016   effexor  150mg >11/04/17 wants to wean off  Significant anxiety and depression, postpartum--consider meds at pregnancy end or immed. Pp, safe with breast feeding only     Depression    Elevated liver enzymes    with both pregnancies, no problems after pregnancies   Frequent headaches    no longer having frequent headaches   History of pre-eclampsia    Morbid obesity (HCC) 04/03/2017   PCOS (polycystic ovarian syndrome)     Surgical History: Past Surgical History:  Procedure Laterality Date   LAPAROSCOPIC SALPINGO OOPHERECTOMY Left 02/23/2020   Procedure: LAPAROSCOPIC SALPINGO  OOPHORECTOMY;  Surgeon: Julianne Octave, MD;  Location: [facility] OR;  Service: Gynecology;  Laterality: Left;   LUMBAR LAMINECTOMY/DECOMPRESSION MICRODISCECTOMY N/A 04/19/2023   Procedure: LUMBAR LAMINECTOMY/DECOMPRESSION MICRODISCECTOMY  Lumbar Four - Five;  Surgeon: Dawley, Colby Daub, DO;  Location: [facility] OR;  Service: Neurosurgery;  Laterality: N/A;   TOOTH EXTRACTION     local anesthestic only    Social History: Social History   Socioeconomic History   Marital status: Married    Spouse name: Emilygrace Salvage   Number of children: Not on file   Years of education: Not on file   Highest education level: Not on file  Occupational History   Not on file  Tobacco Use   Smoking status: Never   Smokeless tobacco: Never  Vaping Use   Vaping status: Never Used  Substance and Sexual Activity   Alcohol use: Not Currently   Drug use: No   Sexual activity: Yes    Birth control/protection: None  Other Topics Concern   Not on file  Social History Narrative   Not on file   Social Drivers of Health   Financial Resource Strain: Low Risk  (05/21/2018)   Overall Financial Resource Strain (CARDIA)    Difficulty of Paying Living Expenses: Not hard at all  Food Insecurity: No Food Insecurity (10/31/2023)   Hunger Vital Sign    Worried About Running Out of Food in the Last Year: Never true    Ran Out of Food in the Last Year: Never true  Transportation Needs: No Transportation Needs (10/31/2023)   PRAPARE - Administrator, Civil Service (Medical): No    Lack of Transportation (Non-Medical): No  Physical Activity: Not on file  Stress: Stress Concern Present (05/21/2018)   [employer] of Occupational Health - Occupational Stress Questionnaire    Feeling of Stress : To some extent  Social Connections: Not on file  Intimate Partner Violence: Not At Risk (10/31/2023)   Humiliation, Afraid, Rape, and Kick questionnaire    Fear of Current or Ex-Partner: No    Emotionally Abused: No     Physically Abused: No    Sexually Abused: No    Family History: Family History  Problem Relation Age of Onset   Depression Mother    Anxiety disorder Mother    Heart disease Mother    Skin cancer Mother    Scoliosis Mother    Alcohol abuse Father    Bipolar disorder Father    Depression Father    Osteochondroma Father    Osteochondroma Sister    COPD Maternal Grandmother    Scoliosis Maternal Grandmother    COPD Maternal Grandfather    Skin cancer Maternal Grandfather    Parkinson's disease Maternal Grandfather    Arthritis Paternal Grandmother    Diabetes Paternal Grandmother    Bipolar disorder Paternal  Grandfather    Osteochondroma Brother    Cancer Neg Hx    Breast cancer Neg Hx     Current Medications:  Current Outpatient Medications:    acetaminophen  (TYLENOL ) 500 MG tablet, Take 1,000 mg by mouth every 6 (six) hours as needed (general pain)., Disp: , Rfl:    ARIPiprazole  (ABILIFY ) 10 MG tablet, Take 1 tablet (10 mg total) by mouth daily., Disp: 30 tablet, Rfl: 1   baclofen  (LIORESAL ) 10 MG tablet, Take 1 tablet (10 mg total) by mouth 3 (three) times daily., Disp: 30 each, Rfl: 0   diphenhydrAMINE -APAP, sleep, (EXCEDRIN PM PO), Take 2 tablets by mouth at bedtime as needed (general pain)., Disp: , Rfl:    venlafaxine  (EFFEXOR ) 50 MG tablet, Effexor  XR taper:  Take the follow to taper off.  Week 1:  take 100 mg (2 tablets) daily.  Week 2:  Take 75 mg (1  tablet) daily.  Week 3:  Take 50 mg (1 tablet) daily.  Week 4: Take 25 mg (1/2 tablet) daily.  Week 5:  Take 25 mg (1/2 tablet) every other day ex. (Mon, Wed, Fri, Sun).  Week 6 Take 25 mg (1/2 tablet) every three days ex. (Mon, Evans, Sun). Then discontinue, Disp: 50 tablet, Rfl: 0   Allergies: Allergies  Allergen Reactions   Etodolac Nausea Only   Meloxicam Other (See Comments)    Abdominal pain   Flexeril [Cyclobenzaprine] Nausea Only   Nsaids Other (See Comments)    Stomach pain. Pt ok with Motrin    Prozac   [Fluoxetine  Hcl] Other (See Comments)    Felt bad/awful   Hydroxyzine  Nausea Only   Tizanidine  Palpitations    REVIEW OF SYSTEMS:   Review of Systems  Constitutional:  Negative for chills, fatigue and fever.  HENT:   Negative for lump/mass, mouth sores, nosebleeds, sore throat and trouble swallowing.   Eyes:  Negative for eye problems.  Respiratory:  Negative for cough and shortness of breath.   Cardiovascular:  Negative for chest pain, leg swelling and palpitations.  Gastrointestinal:  Negative for abdominal pain, constipation, diarrhea, nausea and vomiting.  Genitourinary:  Negative for bladder incontinence, difficulty urinating, dysuria, frequency, hematuria and nocturia.   Musculoskeletal:  Negative for arthralgias, back pain, flank pain, myalgias and neck pain.  Skin:  Negative for itching and rash.  Neurological:  Positive for headaches. Negative for dizziness and numbness.  Hematological:  Does not bruise/bleed easily.  Psychiatric/Behavioral:  Positive for sleep disturbance. Negative for depression and suicidal ideas. The patient is not nervous/anxious.   All other systems reviewed and are negative.    VITALS:   Blood pressure (!) 157/85, pulse 95, temperature 97.6 F (36.4 C), temperature source Tympanic, resp. rate 18, height 5\' 8"  (1.727 m), weight (!) 340 lb (154.2 kg), last menstrual period 10/08/2023, SpO2 100%, unknown if currently breastfeeding.  Wt Readings from Last 3 Encounters:  10/31/23 (!) 340 lb (154.2 kg)  10/29/23 (!) 341 lb (154.7 kg)  08/09/23 (!) 338 lb 6.4 oz (153.5 kg)    Body mass index is 51.7 kg/m.   PHYSICAL EXAM:   Physical Exam Vitals and nursing note reviewed. Exam conducted with a chaperone present.  Constitutional:      Appearance: Normal appearance.  Cardiovascular:     Rate and Rhythm: Normal rate and regular rhythm.     Pulses: Normal pulses.     Heart sounds: Normal heart sounds.  Pulmonary:     Effort: Pulmonary effort is  normal.  Breath sounds: Normal breath sounds.  Abdominal:     Palpations: Abdomen is soft. There is no hepatomegaly, splenomegaly or mass.     Tenderness: There is no abdominal tenderness.  Musculoskeletal:     Right lower leg: No edema.     Left lower leg: No edema.  Lymphadenopathy:     Cervical: No cervical adenopathy.     Right cervical: No superficial, deep or posterior cervical adenopathy.    Left cervical: No superficial, deep or posterior cervical adenopathy.     Upper Body:     Right upper body: No supraclavicular or axillary adenopathy.     Left upper body: No supraclavicular or axillary adenopathy.  Neurological:     General: No focal deficit present.     Mental Status: She is alert and oriented to person, place, and time.  Psychiatric:        Mood and Affect: Mood normal.        Behavior: Behavior normal.     LABS:   CBC    Component Value Date/Time   WBC 9.1 10/14/2023 1210   WBC 10.4 05/17/2023 0126   RBC 5.17 10/14/2023 1210   RBC 4.79 05/17/2023 0126   HGB 10.0 (L) 10/14/2023 1210   HCT 35.4 10/14/2023 1210   PLT 335 10/14/2023 1210   MCV 69 (L) 10/14/2023 1210   MCH 19.3 (L) 10/14/2023 1210   MCH 19.8 (L) 05/17/2023 0126   MCHC 28.2 (L) 10/14/2023 1210   MCHC 28.9 (L) 05/17/2023 0126   RDW 17.5 (H) 10/14/2023 1210   LYMPHSABS 2.8 10/14/2023 1210   MONOABS 0.6 04/18/2023 1459   EOSABS 0.2 10/14/2023 1210   BASOSABS 0.1 10/14/2023 1210    CMP    Component Value Date/Time   NA 138 10/14/2023 1210   K 4.1 10/14/2023 1210   CL 103 10/14/2023 1210   CO2 22 10/14/2023 1210   GLUCOSE 108 (H) 10/14/2023 1210   GLUCOSE 104 (H) 05/17/2023 0126   BUN 9 10/14/2023 1210   CREATININE 0.62 10/14/2023 1210   CALCIUM  8.7 10/14/2023 1210   PROT 7.5 10/14/2023 1210   ALBUMIN 4.0 10/14/2023 1210   AST 12 10/14/2023 1210   ALT 15 10/14/2023 1210   ALKPHOS 137 (H) 10/14/2023 1210   BILITOT <0.2 10/14/2023 1210   GFRNONAA >60 05/17/2023 0126   GFRAA >60  03/02/2020 1336    No results found for: "CEA1", "CEA" / No results found for: "CEA1", "CEA" No results found for: "PSA1" No results found for: "ZOX096" Lab Results  Component Value Date   CAN125 11.3 11/19/2018    No results found for: "TOTALPROTELP", "ALBUMINELP", "A1GS", "A2GS", "BETS", "BETA2SER", "GAMS", "MSPIKE", "SPEI" Lab Results  Component Value Date   TIBC 332 10/14/2023   TIBC 328 11/09/2021   FERRITIN 31 10/14/2023   FERRITIN 56 11/09/2021   FERRITIN 16 05/03/2019   IRONPCTSAT 5 (LL) 10/14/2023   IRONPCTSAT 6 (LL) 11/09/2021   Lab Results  Component Value Date   LDH 135 11/19/2018     STUDIES:   US  LIMITED ULTRASOUND INCLUDING AXILLA LEFT BREAST  Result Date: 10/30/2023 CLINICAL DATA:  29 year old female presenting with palpable area of concern in the LEFT breast, along the inframammary fold. Baseline examination. EXAM: DIGITAL DIAGNOSTIC BILATERAL MAMMOGRAM WITH TOMOSYNTHESIS AND CAD; ULTRASOUND LEFT BREAST LIMITED TECHNIQUE: Bilateral digital diagnostic mammography and breast tomosynthesis was performed. The images were evaluated with computer-aided detection. ; Targeted ultrasound examination of the left breast was performed. COMPARISON:  None available. ACR  Breast Density Category b: There are scattered areas of fibroglandular density. FINDINGS: MAMMOGRAM: Diagnostic mammographic images were obtained over the area of palpable concern in the left breast. No suspicious mammographic finding is identified in this area. No suspicious mass, microcalcification, or other finding is identified in bilateral breasts. ULTRASOUND: On physical exam, no mass palpated along the left inframammary fold. Targeted left breast ultrasound was performed in the area of palpable concern from 5:00 to 7:00. This demonstrates normal fibroglandular tissue. No suspicious solid or cystic mass or area of shadowing is identified. IMPRESSION: 1. No mammographic or sonographic abnormality in the area of  palpable concern in the LEFT breast. 2. No mammographic evidence of malignancy in BILATERAL breasts. RECOMMENDATION: Recommend annual screening mammography beginning at age 6. I have discussed the findings and recommendations with the patient. If applicable, a reminder letter will be sent to the patient regarding the next appointment. BI-RADS CATEGORY  1: Negative. Electronically Signed   By: Sande Cromer M.D.   On: 10/30/2023 14:45

## 2023-10-31 ENCOUNTER — Encounter: Payer: Self-pay | Admitting: Obstetrics & Gynecology

## 2023-10-31 ENCOUNTER — Inpatient Hospital Stay: Attending: Hematology | Admitting: Hematology

## 2023-10-31 ENCOUNTER — Inpatient Hospital Stay

## 2023-10-31 VITALS — BP 157/85 | HR 95 | Temp 97.6°F | Resp 18 | Ht 68.0 in | Wt 340.0 lb

## 2023-10-31 DIAGNOSIS — I2510 Atherosclerotic heart disease of native coronary artery without angina pectoris: Secondary | ICD-10-CM | POA: Diagnosis not present

## 2023-10-31 DIAGNOSIS — Z808 Family history of malignant neoplasm of other organs or systems: Secondary | ICD-10-CM

## 2023-10-31 DIAGNOSIS — Z811 Family history of alcohol abuse and dependence: Secondary | ICD-10-CM | POA: Diagnosis not present

## 2023-10-31 DIAGNOSIS — N6320 Unspecified lump in the left breast, unspecified quadrant: Secondary | ICD-10-CM

## 2023-10-31 DIAGNOSIS — G834 Cauda equina syndrome: Secondary | ICD-10-CM

## 2023-10-31 DIAGNOSIS — Z8249 Family history of ischemic heart disease and other diseases of the circulatory system: Secondary | ICD-10-CM | POA: Diagnosis not present

## 2023-10-31 DIAGNOSIS — Z8759 Personal history of other complications of pregnancy, childbirth and the puerperium: Secondary | ICD-10-CM

## 2023-10-31 DIAGNOSIS — R5383 Other fatigue: Secondary | ICD-10-CM | POA: Diagnosis not present

## 2023-10-31 DIAGNOSIS — Z90721 Acquired absence of ovaries, unilateral: Secondary | ICD-10-CM | POA: Diagnosis not present

## 2023-10-31 DIAGNOSIS — Z8261 Family history of arthritis: Secondary | ICD-10-CM

## 2023-10-31 DIAGNOSIS — F419 Anxiety disorder, unspecified: Secondary | ICD-10-CM

## 2023-10-31 DIAGNOSIS — N920 Excessive and frequent menstruation with regular cycle: Secondary | ICD-10-CM | POA: Diagnosis not present

## 2023-10-31 DIAGNOSIS — Z886 Allergy status to analgesic agent status: Secondary | ICD-10-CM

## 2023-10-31 DIAGNOSIS — F32A Depression, unspecified: Secondary | ICD-10-CM

## 2023-10-31 DIAGNOSIS — D500 Iron deficiency anemia secondary to blood loss (chronic): Secondary | ICD-10-CM | POA: Insufficient documentation

## 2023-10-31 DIAGNOSIS — Z825 Family history of asthma and other chronic lower respiratory diseases: Secondary | ICD-10-CM | POA: Diagnosis not present

## 2023-10-31 DIAGNOSIS — Z8269 Family history of other diseases of the musculoskeletal system and connective tissue: Secondary | ICD-10-CM

## 2023-10-31 DIAGNOSIS — D508 Other iron deficiency anemias: Secondary | ICD-10-CM

## 2023-10-31 DIAGNOSIS — Z79899 Other long term (current) drug therapy: Secondary | ICD-10-CM

## 2023-10-31 DIAGNOSIS — F5089 Other specified eating disorder: Secondary | ICD-10-CM | POA: Diagnosis not present

## 2023-10-31 DIAGNOSIS — E282 Polycystic ovarian syndrome: Secondary | ICD-10-CM | POA: Diagnosis not present

## 2023-10-31 DIAGNOSIS — D649 Anemia, unspecified: Secondary | ICD-10-CM | POA: Diagnosis not present

## 2023-10-31 DIAGNOSIS — Z833 Family history of diabetes mellitus: Secondary | ICD-10-CM

## 2023-10-31 DIAGNOSIS — Z888 Allergy status to other drugs, medicaments and biological substances status: Secondary | ICD-10-CM | POA: Diagnosis not present

## 2023-10-31 NOTE — Patient Instructions (Addendum)
 You were seen and examined today by Dr. Cheree Cords. Dr. Cheree Cords is a hematologist, meaning that he specializes in blood abnormalities. Dr. Cheree Cords discussed your past medical history, family history of cancers/blood conditions and the events that led to you being here today.  You were referred to Dr. Cheree Cords due to iron  deficiency anemia. He recommends you receive an infusion of IV iron  to help improve your iron  and hemoglobin.   Follow-up as scheduled.

## 2023-11-05 ENCOUNTER — Inpatient Hospital Stay

## 2023-11-06 ENCOUNTER — Ambulatory Visit: Payer: Self-pay | Admitting: Obstetrics & Gynecology

## 2023-11-11 ENCOUNTER — Inpatient Hospital Stay

## 2023-11-14 ENCOUNTER — Inpatient Hospital Stay

## 2023-11-14 VITALS — BP 115/80 | HR 83 | Temp 98.0°F | Resp 18

## 2023-11-14 DIAGNOSIS — D500 Iron deficiency anemia secondary to blood loss (chronic): Secondary | ICD-10-CM

## 2023-11-14 MED ORDER — SODIUM CHLORIDE 0.9 % IV SOLN
1000.0000 mg | Freq: Once | INTRAVENOUS | Status: AC
  Administered 2023-11-14: 1000 mg via INTRAVENOUS
  Filled 2023-11-14: qty 1000

## 2023-11-14 MED ORDER — ACETAMINOPHEN 325 MG PO TABS
650.0000 mg | ORAL_TABLET | Freq: Once | ORAL | Status: AC
  Administered 2023-11-14: 650 mg via ORAL
  Filled 2023-11-14: qty 2

## 2023-11-14 MED ORDER — CETIRIZINE HCL 10 MG/ML IV SOLN
10.0000 mg | Freq: Once | INTRAVENOUS | Status: AC
  Administered 2023-11-14: 10 mg via INTRAVENOUS
  Filled 2023-11-14: qty 1

## 2023-11-14 MED ORDER — SODIUM CHLORIDE 0.9 % IV SOLN: Freq: Once | INTRAVENOUS | Status: AC

## 2023-11-14 NOTE — Progress Notes (Signed)
 Patient presents today for iron  infusion.  Patient is in satisfactory condition with no new complaints voiced.  Vital signs are stable. Patient denies taking pre-meds before coming. Cetrizine and tylenol  added to plan-given. We will proceed with infusion per provider orders.    Patient tolerated treatment well.  Patient waited the 30 minute wait time with no complaints. Patient left ambulatory in stable condition.  Vital signs stable at discharge.  Follow up as scheduled.

## 2023-11-14 NOTE — Patient Instructions (Signed)
 [facility] [place] - A DEPT OF [facility]  Discharge Instructions: Thank you for choosing [facility] to provide your oncology and hematology care.  If you have a lab appointment with the Cancer Center - please note that after April 8th, 2024, all labs will be drawn in the cancer center.  You do not have to check in or register with the main entrance as you have in the past but will complete your check-in in the cancer center.  Wear comfortable clothing and clothing appropriate for easy access to any Portacath or PICC line.   We strive to give you quality time with your provider. You may need to reschedule your appointment if you arrive late (15 or more minutes).  Arriving late affects you and other patients whose appointments are after yours.  Also, if you miss three or more appointments without notifying the office, you may be dismissed from the clinic at the provider's discretion.      For prescription refill requests, have your pharmacy contact our office and allow 72 hours for refills to be completed.    Today you received the following Monoferric infusion.   Ferric Derisomaltose Injection What is this medication? FERRIC DERISOMALTOSE (FER ik der EYE soe MAWL tose) treats low levels of iron  in your body (iron  deficiency anemia). Iron  is a mineral that plays an important role in making red blood cells, which carry oxygen from your lungs to the rest of your body. This medicine may be used for other purposes; ask your health care provider or pharmacist if you have questions. COMMON BRAND NAME(S): MONOFERRIC What should I tell my care team before I take this medication? They need to know if you have any of these conditions: High levels of iron  in the blood An unusual or allergic reaction to iron , other medications, foods, dyes, or preservatives Pregnant or trying to get pregnant Breastfeeding How should I use this medication? This medication is  injected into a vein. It is given by your care team in a hospital or clinic setting. Talk to your care team about the use of this medication in children. Special care may be needed. Overdosage: If you think you have taken too much of this medicine contact a poison control center or emergency room at once. NOTE: This medicine is only for you. Do not share this medicine with others. What if I miss a dose? It is important not to miss your dose. Call your care team if you are unable to keep an appointment. What may interact with this medication? Do not take this medication with any of the following: Deferoxamine Dimercaprol Other iron  products This list may not describe all possible interactions. Give your health care provider a list of all the medicines, herbs, non-prescription drugs, or dietary supplements you use. Also tell them if you smoke, drink alcohol, or use illegal drugs. Some items may interact with your medicine. What should I watch for while using this medication? Visit your care team for regular checks on your progress. Tell your care team if your symptoms do not start to get better or if they get worse. You may need blood work done while you are taking this medication. You may need to eat more foods that contain iron . Talk to your care team. Foods that contain iron  include whole grains or cereals, dried fruits, beans, peas, leafy green vegetables, and organ meats (liver, kidney). What side effects may I notice from receiving this medication?  Side effects that you should report to your care team as soon as possible: Allergic reactions--skin rash, itching, hives, swelling of the face, lips, tongue, or throat Low blood pressure--dizziness, feeling faint or lightheaded, blurry vision Shortness of breath Side effects that usually do not require medical attention (report to your care team if they continue or are bothersome): Flushing Headache Joint pain Muscle pain Nausea Pain,  redness, or irritation at injection site This list may not describe all possible side effects. Call your doctor for medical advice about side effects. You may report side effects to FDA at 1-800-FDA-1088. Where should I keep my medication? This medication is given in a hospital or clinic. It will not be stored at home. NOTE: This sheet is a summary. It may not cover all possible information. If you have questions about this medicine, talk to your doctor, pharmacist, or health care provider.  2024 Elsevier/Gold Standard (2023-01-30 00:00:00)   To help prevent nausea and vomiting after your treatment, we encourage you to take your nausea medication as directed.  BELOW ARE SYMPTOMS THAT SHOULD BE REPORTED IMMEDIATELY: *FEVER GREATER THAN 100.4 F (38 C) OR HIGHER *CHILLS OR SWEATING *NAUSEA AND VOMITING THAT IS NOT CONTROLLED WITH YOUR NAUSEA MEDICATION *UNUSUAL SHORTNESS OF BREATH *UNUSUAL BRUISING OR BLEEDING *URINARY PROBLEMS (pain or burning when urinating, or frequent urination) *BOWEL PROBLEMS (unusual diarrhea, constipation, pain near the anus) TENDERNESS IN MOUTH AND THROAT WITH OR WITHOUT PRESENCE OF ULCERS (sore throat, sores in mouth, or a toothache) UNUSUAL RASH, SWELLING OR PAIN  UNUSUAL VAGINAL DISCHARGE OR ITCHING   Items with * indicate a potential emergency and should be followed up as soon as possible or go to the Emergency Department if any problems should occur.  Please show the CHEMOTHERAPY ALERT CARD or IMMUNOTHERAPY ALERT CARD at check-in to the Emergency Department and triage nurse.  Should you have questions after your visit or need to cancel or reschedule your appointment, please contact [facility] [place] - A DEPT OF [facility] [phone]  and follow the prompts.  Office hours are 8:00 a.m. to 4:30 p.m. Monday - Friday. Please note that voicemails left after 4:00 p.m. may not be returned until the following business day.  We are closed  weekends and major holidays. You have access to a nurse at all times for urgent questions. Please call the main number to the clinic [phone] and follow the prompts.  For any non-urgent questions, you may also contact your provider using MyChart. We now offer e-Visits for anyone 67 and older to request care online for non-urgent symptoms. For details visit [email or web address].   Also download the MyChart app! Go to the app store, search "MyChart", open the app, select [facility], and log in with your MyChart username and password.

## 2023-12-16 ENCOUNTER — Encounter (HOSPITAL_COMMUNITY): Payer: Self-pay | Admitting: Registered Nurse

## 2023-12-16 ENCOUNTER — Telehealth (INDEPENDENT_AMBULATORY_CARE_PROVIDER_SITE_OTHER): Admitting: Registered Nurse

## 2023-12-16 DIAGNOSIS — F411 Generalized anxiety disorder: Secondary | ICD-10-CM | POA: Diagnosis not present

## 2023-12-16 DIAGNOSIS — F332 Major depressive disorder, recurrent severe without psychotic features: Secondary | ICD-10-CM | POA: Diagnosis not present

## 2023-12-16 DIAGNOSIS — F410 Panic disorder [episodic paroxysmal anxiety] without agoraphobia: Secondary | ICD-10-CM

## 2023-12-16 MED ORDER — ARIPIPRAZOLE 15 MG PO TABS: 15.0000 mg | ORAL_TABLET | Freq: Every day | ORAL | 0 refills | Status: DC

## 2023-12-16 MED ORDER — GABAPENTIN 100 MG PO CAPS: 200.0000 mg | ORAL_CAPSULE | Freq: Three times a day (TID) | ORAL | 0 refills | Status: DC

## 2023-12-16 NOTE — Progress Notes (Signed)
 [facility] MD/PA/NP OP Progress Note  12/16/2023 8:38 PM Latoya Mora  MRN:  [id number]  Virtual Visit via Video Note  I connected with Latoya Mora on 12/16/23 at 11:30 AM EDT by a video enabled telemedicine application and verified that I am speaking with the correct person using two identifiers.  Location: Patient: Home Provider: [facility], [place]   I discussed the limitations of evaluation and management by telemedicine and the availability of in person appointments. The patient expressed understanding and agreed to proceed.    I discussed the assessment and treatment plan with the patient. The patient was provided an opportunity to ask questions and all were answered. The patient agreed with the plan and demonstrated an understanding of the instructions.   The patient was advised to call back or seek an in-person evaluation if the symptoms worsen or if the condition fails to improve as anticipated.  I provided 20 minutes of non-face-to-face time during this encounter.   Latoya Ruder, NP    Chief Complaint:  Chief Complaint  Patient presents with   Follow-up    Medication management   HPI: Latoya Mora 28 y.o. female presents today for medication management follow up.  She is seen via virtual video visit by this provider, and chart reviewed on 12/16/23.  Her psychiatric history is significant for major depressive disorder recurrent and general anxiety disorder with panic attacks.  Her mental health is currently managed with Abilify  10 mg daily, Paxil  10 mg, gabapentin  100 mg 3 times daily, and Effexor  taper.  She reports that she stopped taking the Paxil  related to it making her feel exhausted daily.  Reports all other medications she is taking is working effectively without any adverse reactions.  She reports she continues to improve and feels a little better since stopping the Paxil .  She denies suicidal/self-harm/homicidal ideation, psychosis, paranoia, and abnormal movements.   However, she does state that anxiety could be a little better related to getting bouts of anxiety out of nowhere but no panic attacks.  Recommended the following: Increased gabapentin  200 mg 3 times daily, increased Abilify  15 mg daily, and continue Effexor  taper.  Keep scheduled appointment for counseling/therapy.  Follow-up 2 weeks. She voices understanding with information being given to her today and is agreeable to recommendations.    Visit Diagnosis:    ICD-10-CM   1. Severe episode of recurrent major depressive disorder, without psychotic features (HCC)  F33.2 ARIPiprazole  (ABILIFY ) 15 MG tablet    2. Generalized anxiety disorder with panic attacks  F41.1 gabapentin  (NEURONTIN ) 100 MG capsule   F41.0       Past Psychiatric History: Major depressive disorder, general anxiety disorder  Prior psychotropic medications:  Prozac , Zoloft , Cymbalta, Celexa, Wellbutrin , Lexapro , Buspar , Vistaril , Xanax, and Klonopin .   Past Medical History:  Past Medical History:  Diagnosis Date   Allergy    Anemia    during pregnancies   Anxiety    Anxiety and depression 09/18/2016   effexor  150mg >11/04/17 wants to wean off  Significant anxiety and depression, postpartum--consider meds at pregnancy end or immed. Pp, safe with breast feeding only     Depression    Elevated liver enzymes    with both pregnancies, no problems after pregnancies   Frequent headaches    no longer having frequent headaches   History of pre-eclampsia    Morbid obesity (HCC) 04/03/2017   PCOS (polycystic ovarian syndrome)     Past Surgical History:  Procedure Laterality Date   LAPAROSCOPIC SALPINGO OOPHERECTOMY Left  02/23/2020   Procedure: LAPAROSCOPIC SALPINGO OOPHORECTOMY;  Surgeon: Herchel Gloris LABOR, MD;  Location: [facility] OR;  Service: Gynecology;  Laterality: Left;   LUMBAR LAMINECTOMY/DECOMPRESSION MICRODISCECTOMY N/A 04/19/2023   Procedure: LUMBAR LAMINECTOMY/DECOMPRESSION MICRODISCECTOMY  Lumbar Four - Five;  Surgeon:  Dawley, Lani BROCKS, DO;  Location: [facility] OR;  Service: Neurosurgery;  Laterality: N/A;   TOOTH EXTRACTION     local anesthestic only    Family Psychiatric History: See below in family history  Family History:  Family History  Problem Relation Age of Onset   Depression Mother    Anxiety disorder Mother    Heart disease Mother    Skin cancer Mother    Scoliosis Mother    Alcohol abuse Father    Bipolar disorder Father    Depression Father    Osteochondroma Father    Osteochondroma Sister    COPD Maternal Grandmother    Scoliosis Maternal Grandmother    COPD Maternal Grandfather    Skin cancer Maternal Grandfather    Parkinson's disease Maternal Grandfather    Arthritis Paternal Grandmother    Diabetes Paternal Grandmother    Bipolar disorder Paternal Grandfather    Osteochondroma Brother    Cancer Neg Hx    Breast cancer Neg Hx     Social History:  Social History   Socioeconomic History   Marital status: Married    Spouse name: Latoya Mora   Number of children: Not on file   Years of education: Not on file   Highest education level: Not on file  Occupational History   Not on file  Tobacco Use   Smoking status: Never   Smokeless tobacco: Never  Vaping Use   Vaping status: Never Used  Substance and Sexual Activity   Alcohol use: Not Currently   Drug use: No   Sexual activity: Yes    Birth control/protection: None  Other Topics Concern   Not on file  Social History Narrative   Not on file   Social Drivers of Health   Financial Resource Strain: Low Risk  (05/21/2018)   Overall Financial Resource Strain (CARDIA)    Difficulty of Paying Living Expenses: Not hard at all  Food Insecurity: No Food Insecurity (10/31/2023)   Hunger Vital Sign    Worried About Running Out of Food in the Last Year: Never true    Ran Out of Food in the Last Year: Never true  Transportation Needs: No Transportation Needs (10/31/2023)   PRAPARE - Administrator, Civil Service  (Medical): No    Lack of Transportation (Non-Medical): No  Physical Activity: Not on file  Stress: Stress Concern Present (05/21/2018)   [employer] of Occupational Health - Occupational Stress Questionnaire    Feeling of Stress : To some extent  Social Connections: Not on file    Allergies:  Allergies  Allergen Reactions   Etodolac Nausea Only   Meloxicam Other (See Comments)    Abdominal pain   Flexeril [Cyclobenzaprine] Nausea Only   Nsaids Other (See Comments)    Stomach pain. Pt ok with Motrin    Prozac  [Fluoxetine  Hcl] Other (See Comments)    Felt bad/awful   Hydroxyzine  Nausea Only   Tizanidine  Palpitations    Metabolic Disorder Labs: Reviewed with patient Lab Results  Component Value Date   HGBA1C 5.7 (H) 10/14/2023   Lab Results  Component Value Date   PROLACTIN 8.2 10/14/2023   Lab Results  Component Value Date   CHOL 145 10/14/2023   TRIG 90  10/14/2023   HDL 38 (L) 10/14/2023   CHOLHDL 3.8 10/14/2023   LDLCALC 90 10/14/2023   LDLCALC 90 10/14/2023   Lab Results  Component Value Date   TSH 1.660 10/14/2023   TSH 1.580 10/14/2023    Current Medications: Current Outpatient Medications  Medication Sig Dispense Refill   acetaminophen  (TYLENOL ) 500 MG tablet Take 1,000 mg by mouth every 6 (six) hours as needed (general pain).     ARIPiprazole  (ABILIFY ) 15 MG tablet Take 1 tablet (15 mg total) by mouth daily. 30 tablet 0   baclofen  (LIORESAL ) 10 MG tablet Take 1 tablet (10 mg total) by mouth 3 (three) times daily. 30 each 0   diphenhydrAMINE -APAP, sleep, (EXCEDRIN PM PO) Take 2 tablets by mouth at bedtime as needed (general pain).     gabapentin  (NEURONTIN ) 100 MG capsule Take 2 capsules (200 mg total) by mouth 3 (three) times daily. 60 capsule 0   propranolol  (INDERAL ) 10 MG tablet Take 10 mg by mouth 3 (three) times daily.     venlafaxine  (EFFEXOR ) 50 MG tablet Effexor  XR taper:  Take the follow to taper off.  Week 1:  take 100 mg (2 tablets) daily.   Week 2:  Take 75 mg (1  tablet) daily.  Week 3:  Take 50 mg (1 tablet) daily.  Week 4: Take 25 mg (1/2 tablet) daily.  Week 5:  Take 25 mg (1/2 tablet) every other day ex. (Mon, Wed, Fri, Sun).  Week 6 Take 25 mg (1/2 tablet) every three days ex. (Mon, [place], Sun). Then discontinue 50 tablet 0   No current facility-administered medications for this visit.     Musculoskeletal: Strength & Muscle Tone: Unable to assess via virtual visit Gait & Station: Unable to assess via virtual visit Patient leans: N/A  Psychiatric Specialty Exam: Review of Systems  Constitutional:        No other complaints voiced  Psychiatric/Behavioral:  Positive for dysphoric mood. Negative for hallucinations, self-injury, sleep disturbance (Reports sleeping too much) and suicidal ideas. The patient is nervous/anxious.   All other systems reviewed and are negative.   unknown if currently breastfeeding.There is no height or weight on file to calculate BMI.  General Appearance: Casual  Eye Contact:  Good  Speech:  Clear and Coherent and Normal Rate  Volume:  Normal  Mood:  Anxious  Affect:  Congruent  Thought Process:  Coherent, Goal Directed, and Descriptions of Associations: Intact  Orientation:  Full (Time, Place, and Person)  Thought Content: Logical   Suicidal Thoughts:  No  Homicidal Thoughts:  No  Memory:  Immediate;   Good Recent;   Good Remote;   Good  Judgement:  Intact  Insight:  Present  Psychomotor Activity:  Normal  Concentration:  Concentration: Good and Attention Span: Good  Recall:  Good  Fund of Knowledge: Good  Language: Good  Akathisia:  No  Handed:  Right  AIMS (if indicated): not done  Assets:  Communication Skills Desire for Improvement Housing Physical Health Resilience Transportation  ADL's:  Intact  Cognition: WNL  Sleep:  Fair   Screenings: Geneticist, molecular Office Visit from 10/14/2023 in [facility] at [place]  AIMS Total Score  0   GAD-7    Flowsheet Row Office Visit from 10/29/2023 in [facility] for [facility] Office Visit from 10/14/2023 in [facility] [place] Office Visit from 08/09/2023 in [facility] [place] [facility]  Visit from 07/12/2023 in [facility] Office Visit from 11/09/2021 in [facility]  Total GAD-7 Score 16 20 17 18 19    PHQ2-9    Flowsheet Row Office Visit from 10/31/2023 in [facility] [place] - [facility] Dept Of [place]. [facility] Office Visit from 10/29/2023 in [facility] for [facility] at [facility] Office Visit from 10/14/2023 in [facility] [place] Office Visit from 08/09/2023 in [facility] Office Visit from 07/12/2023 in [facility]  PHQ-2 Total Score 4 4 5 6 6   PHQ-9 Total Score 13 13 17 22 22    Flowsheet Row ED from 05/17/2023 in [facility] Department at [facility] ED to Hosp-Admission (Discharged) from 04/18/2023 in [facility] from 04/12/2023 in [facility] RISK CATEGORY No Risk No Risk No Risk     Assessment and Plan:  Assessment: Patient seen and examined as noted above. Summary: Today Latoya Mora appears to be doing fairly well.  However, reports stopped taking Paxil  related to it making her feel exhausted every day, reports sleeping too much related to feeling exhausted all the time because of the Paxil .  Reports eating without any difficulty.  She does deny suicidal/self-harm/homicidal ideations, psychosis, paranoia, and abnormal movements During visit she is dressed appropriate for age and weather.  She is seated comfortably in view of camera with no noted distress.  She is alert/oriented x 4, calm/cooperative and mood is congruent with affect.  She spoke in a clear  tone at moderate volume, and normal pace, with good eye contact.  Her thought process is coherent, relevant, and there is no indication that she is currently responding to internal/external stimuli or experiencing delusional thought content.   1. Severe episode of recurrent major depressive disorder, without psychotic features (HCC) (Primary) - ARIPiprazole  (ABILIFY ) 15 MG tablet; Take 1 tablet (15 mg total) by mouth daily.  Dispense: 30 tablet; Refill: 0  2. Generalized anxiety disorder with panic attacks - gabapentin  (NEURONTIN ) 100 MG capsule; Take 2 capsules (200 mg total) by mouth 3 (three) times daily.  Dispense: 60 capsule; Refill: 0   Plan: Medications: Meds ordered this encounter  Medications   ARIPiprazole  (ABILIFY ) 15 MG tablet    Sig: Take 1 tablet (15 mg total) by mouth daily.    Dispense:  30 tablet    Refill:  0    Supervising Provider:   ARFEEN, SYED T [[id number]]   gabapentin  (NEURONTIN ) 100 MG capsule    Sig: Take 2 capsules (200 mg total) by mouth 3 (three) times daily.    Dispense:  60 capsule    Refill:  0    Supervising Provider:   CURRY PATERSON T [[id number]]    Labs:  Reviewed. Not indicated at this time  Other:  Keep scheduled appointment with Latoya Mora on 01/07/2024 at 2:00 PM for counseling/therapy.   Latoya Mora is instructed to call [phone], mobile crisis, or present to the nearest emergency room should she experience any suicidal/homicidal ideation, auditory/visual/hallucinations, or detrimental worsening of her mental health condition.   Latoya Mora has participated in the development of this treatment plan and verbalized her agreement with plan as listed.  Follow Up: Return in 2 weeks for medication management Call in the interim for any side-effects, decompensation, questions, or problems  Collaboration of Care: Collaboration of Care: Medication Management  AEB Medication assessment, adjustment, and refill  Patient/Guardian was advised Release of Information  must be obtained prior to any record release in order to collaborate their care with an outside provider. Patient/Guardian was advised if they have not already done so to contact the registration department to sign all necessary forms in order for us  to release information regarding their care.   Consent: Patient/Guardian gives verbal consent for treatment and assignment of benefits for services provided during this visit. Patient/Guardian expressed understanding and agreed to proceed.    Zaeden Lastinger, NP 12/16/2023, 8:38 PM

## 2023-12-16 NOTE — Patient Instructions (Signed)

## 2023-12-18 ENCOUNTER — Telehealth: Admitting: Physician Assistant

## 2023-12-18 ENCOUNTER — Encounter: Payer: Self-pay | Admitting: Physician Assistant

## 2023-12-18 ENCOUNTER — Telehealth (HOSPITAL_COMMUNITY): Payer: Self-pay

## 2023-12-18 ENCOUNTER — Emergency Department (HOSPITAL_COMMUNITY): Admission: EM | Admit: 2023-12-18 | Discharge: 2023-12-18 | Discharge status: Against Medical Advice

## 2023-12-18 DIAGNOSIS — F419 Anxiety disorder, unspecified: Secondary | ICD-10-CM | POA: Diagnosis not present

## 2023-12-18 MED ORDER — HYDROXYZINE PAMOATE 25 MG PO CAPS: 25.0000 mg | ORAL_CAPSULE | Freq: Three times a day (TID) | ORAL | 0 refills | Status: DC | PRN

## 2023-12-18 NOTE — Telephone Encounter (Signed)
 Pt was seen by Latoya Mora 12/16/23, she states that she is having severe anxiety since last night which is very ramped up. She has taken 2 gabapentins today has been shaking and nauseous, racing heart, and mind is going going going and will not stop. Please advise I have advised of urgent care locations.

## 2023-12-18 NOTE — Patient Instructions (Signed)
  [name], thank you for joining Lynden GORMAN Snuffer, PA-C for today's virtual visit.  While this provider is not your primary care provider (PCP), if your PCP is located in our provider database this encounter information will be shared with them immediately following your visit.   A [facility] MyChart account gives you access to today's visit and all your visits, tests, and labs performed at [facility]  click here if you don't have a [facility] MyChart account or go to [email or web address]  Consent: (Patient) Latoya Mora provided verbal consent for this virtual visit at the beginning of the encounter.  Current Medications:  Current Outpatient Medications:    acetaminophen  (TYLENOL ) 500 MG tablet, Take 1,000 mg by mouth every 6 (six) hours as needed (general pain)., Disp: , Rfl:    ARIPiprazole  (ABILIFY ) 15 MG tablet, Take 1 tablet (15 mg total) by mouth daily., Disp: 30 tablet, Rfl: 0   baclofen  (LIORESAL ) 10 MG tablet, Take 1 tablet (10 mg total) by mouth 3 (three) times daily., Disp: 30 each, Rfl: 0   diphenhydrAMINE -APAP, sleep, (EXCEDRIN PM PO), Take 2 tablets by mouth at bedtime as needed (general pain)., Disp: , Rfl:    gabapentin  (NEURONTIN ) 100 MG capsule, Take 2 capsules (200 mg total) by mouth 3 (three) times daily., Disp: 60 capsule, Rfl: 0   propranolol  (INDERAL ) 10 MG tablet, Take 10 mg by mouth 3 (three) times daily., Disp: , Rfl:    venlafaxine  (EFFEXOR ) 50 MG tablet, Effexor  XR taper:  Take the follow to taper off.  Week 1:  take 100 mg (2 tablets) daily.  Week 2:  Take 75 mg (1  tablet) daily.  Week 3:  Take 50 mg (1 tablet) daily.  Week 4: Take 25 mg (1/2 tablet) daily.  Week 5:  Take 25 mg (1/2 tablet) every other day ex. (Mon, Wed, Fri, Sun).  Week 6 Take 25 mg (1/2 tablet) every three days ex. (Mon, [place], Sun). Then discontinue, Disp: 50 tablet, Rfl: 0   Medications ordered in this encounter:  No orders of the defined types were placed in this  encounter.    *If you need refills on other medications prior to your next appointment, please contact your pharmacy*  Follow-Up: Call back or seek an in-person evaluation if the symptoms worsen or if the condition fails to improve as anticipated.  [facility] [phone]  Other Instructions You were given a prescription for hydroxyzine . This medication may make you sleepy so do not drive or work while taking it. You may take 25-50mg  three times daily   Follow up with your regular doctor in 1 week for reassessment and seek care sooner if your symptoms worsen or fail to improve.    If you have been instructed to have an in-person evaluation today at a local Urgent Care facility, please use the link below. It will take you to a list of all of our available [facility], including address, phone number and hours of operation. Please do not delay care.  [facility]  If you or a family member do not have a primary care provider, use the link below to schedule a visit and establish care. When you choose a [facility] primary care physician or advanced practice provider, you gain a long-term partner in health. Find a Primary Care Provider  Learn more about [facility] in-office and virtual care options: [facility] - Get Care Now

## 2023-12-18 NOTE — Progress Notes (Signed)
 Ms. retal, tonkinson are scheduled for a virtual visit with your provider today.    Just as we do with appointments in the office, we must obtain your consent to participate.  Your consent will be active for this visit and any virtual visit you may have with one of our providers in the next 365 days.    If you have a MyChart account, I can also send a copy of this consent to you electronically.  All virtual visits are billed to your insurance company just like a traditional visit in the office.  As this is a virtual visit, video technology does not allow for your provider to perform a traditional examination.  This may limit your provider's ability to fully assess your condition.  If your provider identifies any concerns that need to be evaluated in person or the need to arrange testing such as labs, EKG, etc, we will make arrangements to do so.    Although advances in technology are sophisticated, we cannot ensure that it will always work on either your end or our end.  If the connection with a video visit is poor, we may have to switch to a telephone visit.  With either a video or telephone visit, we are not always able to ensure that we have a secure connection.   I need to obtain your verbal consent now.   Are you willing to proceed with your visit today?   Christyna Catanzaro has provided verbal consent on 12/18/2023 for a virtual visit (video or telephone).   Lynden GORMAN Snuffer, PA-C 12/18/2023  4:31 PM   Date:  12/18/2023   ID:  Mindel Friscia, DOB [birth year], MRN [id number]  Patient Location: Home Provider Location: Home Office   Participants: Patient and Provider for Visit and Wrap up  Method of visit: Video  Location of Patient: Home Location of Provider: Home Office Consent was obtain for visit over the video. Services rendered by provider: Visit was performed via video  A video enabled telemedicine application was used and I verified that I am speaking with the correct person using two  identifiers.  PCP:  G[place], [place], [place]   Chief Complaint:  anxiety  History of Present Illness:    Latoya Mora is a 28 y.o. female with history as stated below. Presents video telehealth for an acute care visit  Pt states she has tapered down on her effexor  as prescribed by her PCP. She just reduced the dose by 25mg . She reports increased anxiety, racing thoughts, shaking, decreased appetite and nausea. She denies chest pain. She reports that this feels very similar to when she has had increased anxiety in the past.   She tried contacting her provider who did not have an appointment today and advised her to have an in person visit elsewhere.   Past Medical, Surgical, Social History, Allergies, and Medications have been Reviewed.  Past Medical History:  Diagnosis Date   Allergy    Anemia    during pregnancies   Anxiety    Anxiety and depression 09/18/2016   effexor  150mg >11/04/17 wants to wean off  Significant anxiety and depression, postpartum--consider meds at pregnancy end or immed. Pp, safe with breast feeding only     Depression    Elevated liver enzymes    with both pregnancies, no problems after pregnancies   Frequent headaches    no longer having frequent headaches   History of pre-eclampsia    Morbid obesity (HCC) 04/03/2017   PCOS (polycystic ovarian syndrome)  No outpatient medications have been marked as taking for the 12/18/23 encounter (Video Visit) with [facility] PROVIDER.     Allergies:   Etodolac, Meloxicam, Flexeril [cyclobenzaprine], Nsaids, Prozac  [fluoxetine  hcl], Hydroxyzine , and Tizanidine    ROS See HPI for history of present illness.  Physical Exam  Neurological:     Mental Status: She is alert.   Psychiatric:        Mood and Affect: Mood is anxious.        Speech: Speech normal.        Behavior: Behavior is cooperative.        Thought Content: Thought content is not paranoid or delusional. Thought content does not include  suicidal ideation. Thought content does not include suicidal plan.        Judgment: Judgment normal.                MDM: Pt with anxiety after decreasing dose of effexor , sxs consistent with prior anxiety. Rx for hydroxyzine  sent. Denies SI.    Tests Ordered: No orders of the defined types were placed in this encounter.   Medication Changes: No orders of the defined types were placed in this encounter.    Disposition:  Follow up  Signed, Lynden GORMAN Snuffer, PA-C  12/18/2023 4:31 PM

## 2023-12-18 NOTE — Telephone Encounter (Signed)
 Spoke with pt she is in route to [facility] but will go to er if they can't help her

## 2023-12-23 ENCOUNTER — Other Ambulatory Visit (HOSPITAL_COMMUNITY): Payer: Self-pay | Admitting: Registered Nurse

## 2023-12-23 ENCOUNTER — Telehealth (HOSPITAL_COMMUNITY): Payer: Self-pay | Admitting: *Deleted

## 2023-12-23 ENCOUNTER — Encounter (HOSPITAL_COMMUNITY): Payer: Self-pay | Admitting: Registered Nurse

## 2023-12-23 DIAGNOSIS — F410 Panic disorder [episodic paroxysmal anxiety] without agoraphobia: Secondary | ICD-10-CM

## 2023-12-23 DIAGNOSIS — F332 Major depressive disorder, recurrent severe without psychotic features: Secondary | ICD-10-CM

## 2023-12-23 MED ORDER — GABAPENTIN 100 MG PO CAPS: 200.0000 mg | ORAL_CAPSULE | Freq: Three times a day (TID) | ORAL | 1 refills | Status: DC

## 2023-12-23 MED ORDER — VENLAFAXINE HCL 50 MG PO TABS: ORAL_TABLET | ORAL | 0 refills | Status: DC

## 2023-12-23 MED ORDER — HYDROXYZINE PAMOATE 25 MG PO CAPS: 25.0000 mg | ORAL_CAPSULE | Freq: Three times a day (TID) | ORAL | 1 refills | Status: DC | PRN

## 2023-12-23 MED ORDER — ARIPIPRAZOLE 15 MG PO TABS: 15.0000 mg | ORAL_TABLET | Freq: Every day | ORAL | 1 refills | Status: DC

## 2023-12-23 NOTE — Telephone Encounter (Signed)
 PATIENT STATES THAT SHE STARTED LAST VISIT TAPERING DOWN ON THE VENLAFAXINE  (EFFEXOR )50 MG AND THAT SHE 'S BEEN ALL WEEKEND WITHOUT MEDICATION

## 2023-12-23 NOTE — Telephone Encounter (Signed)
 PATIENT REQUEST  venlafaxine  (EFFEXOR ) 50 MG tablet   [id number] - [place], [place] - [address]

## 2023-12-30 ENCOUNTER — Telehealth (HOSPITAL_COMMUNITY): Admitting: Registered Nurse

## 2024-01-02 ENCOUNTER — Encounter (HOSPITAL_COMMUNITY): Payer: Self-pay | Admitting: Registered Nurse

## 2024-01-02 ENCOUNTER — Telehealth (INDEPENDENT_AMBULATORY_CARE_PROVIDER_SITE_OTHER): Admitting: Registered Nurse

## 2024-01-02 DIAGNOSIS — F332 Major depressive disorder, recurrent severe without psychotic features: Secondary | ICD-10-CM | POA: Diagnosis not present

## 2024-01-02 DIAGNOSIS — G4700 Insomnia, unspecified: Secondary | ICD-10-CM

## 2024-01-02 DIAGNOSIS — F410 Panic disorder [episodic paroxysmal anxiety] without agoraphobia: Secondary | ICD-10-CM | POA: Diagnosis not present

## 2024-01-02 DIAGNOSIS — F411 Generalized anxiety disorder: Secondary | ICD-10-CM

## 2024-01-02 MED ORDER — QUETIAPINE FUMARATE ER 50 MG PO TB24: 50.0000 mg | ORAL_TABLET | Freq: Every day | ORAL | 0 refills | Status: DC

## 2024-01-02 MED ORDER — VENLAFAXINE HCL ER 75 MG PO CP24: 75.0000 mg | ORAL_CAPSULE | Freq: Every day | ORAL | 0 refills | Status: DC

## 2024-01-02 MED ORDER — QUETIAPINE FUMARATE 25 MG PO TABS: 25.0000 mg | ORAL_TABLET | Freq: Every day | ORAL | 0 refills | Status: DC

## 2024-01-02 NOTE — Patient Instructions (Signed)

## 2024-01-02 NOTE — Progress Notes (Signed)
 [facility] MD/PA/NP OP Progress Note  01/02/2024 2:36 PM Latoya Mora  MRN:  [id number]  Virtual Visit via Video Note  I connected with Latoya Mora on 01/02/24 at 11:00 AM EDT by a video enabled telemedicine application and verified that I am speaking with the correct person using two identifiers.  Location: Patient: Home Provider: [facility] [place]   I discussed the limitations of evaluation and management by telemedicine and the availability of in person appointments. The patient expressed understanding and agreed to proceed.    I discussed the assessment and treatment plan with the patient. The patient was provided an opportunity to ask questions and all were answered. The patient agreed with the plan and demonstrated an understanding of the instructions.   The patient was advised to call back or seek an in-person evaluation if the symptoms worsen or if the condition fails to improve as anticipated.  I provided 30 minutes of non-face-to-face time during this encounter.   Latoya Ruder, NP    Chief Complaint:  Chief Complaint  Patient presents with   Follow-up    Medication management   HPI: Latoya Mora 28 y.o. female presents today for medication management follow up.  She is seen via virtual video visit by this provider, and chart reviewed on 01/02/24.  Her psychiatric history is significant for major depressive disorder recurrent and general anxiety disorder with panic attacks.  Her mental health is currently managed with Abilify  15 mg daily, Gabapentin  200 mg 3 times daily, and Effexor  taper at week 3 50 mg daily, and Vistaril  25 mg Tid prn.  She reports there has been a worsening in her anxiety.  I'm living in a constant stated of anxiety.  The Gabapentin  and Vistaril  do not help at all.    Today she denies suicidal/self-harm/homicidal ideation, psychosis, paranoia, and abnormal movements.  Treatment options discussed:  Stopping Vistaril , Gabapentin  since they are not  working.  Starting Seroquel  which will help with depression, anxiety, mood, and sleep.  Increasing Effexor  back to 75 mg daily.  Continue therapy with Latoya Rubinstein, LCSW  Recommended the following: Discontinue Gabapentin  200 mg 3 times daily and Vistaril  25 mg Tid prn.  Continue Abilify  15 mg daily.  Stat Seroquel  XR 50 mg Q hs, and Seroquel  25 mg Tid prn.  Stop taper of Effexor  and restart 75 mg daily.  Provided information on efficacy/side effects on Seroquel .       Visit Diagnosis:    ICD-10-CM   1. Severe episode of recurrent major depressive disorder, without psychotic features (HCC)  F33.2 venlafaxine  XR (EFFEXOR  XR) 75 MG 24 hr capsule    QUEtiapine  (SEROQUEL  XR) 50 MG TB24 24 hr tablet    2. Generalized anxiety disorder with panic attacks  F41.1 venlafaxine  XR (EFFEXOR  XR) 75 MG 24 hr capsule   F41.0 QUEtiapine  (SEROQUEL  XR) 50 MG TB24 24 hr tablet    QUEtiapine  (SEROQUEL ) 25 MG tablet    3. Insomnia, unspecified type  G47.00 QUEtiapine  (SEROQUEL  XR) 50 MG TB24 24 hr tablet      Past Psychiatric History: Major depressive disorder, general anxiety disorder  Prior psychotropic medications:  Prozac , Zoloft , Cymbalta, Celexa, Wellbutrin , Lexapro , Buspar , Vistaril , Xanax, and Klonopin .   Past Medical History:  Past Medical History:  Diagnosis Date   Allergy    Anemia    during pregnancies   Anxiety    Anxiety and depression 09/18/2016   effexor  150mg >11/04/17 wants to wean off  Significant anxiety and depression, postpartum--consider meds at pregnancy end  or immed. Pp, safe with breast feeding only     Depression    Elevated liver enzymes    with both pregnancies, no problems after pregnancies   Frequent headaches    no longer having frequent headaches   History of pre-eclampsia    Morbid obesity (HCC) 04/03/2017   PCOS (polycystic ovarian syndrome)     Past Surgical History:  Procedure Laterality Date   LAPAROSCOPIC SALPINGO OOPHERECTOMY Left 02/23/2020   Procedure:  LAPAROSCOPIC SALPINGO OOPHORECTOMY;  Surgeon: Herchel Gloris LABOR, MD;  Location: [facility] OR;  Service: Gynecology;  Laterality: Left;   LUMBAR LAMINECTOMY/DECOMPRESSION MICRODISCECTOMY N/A 04/19/2023   Procedure: LUMBAR LAMINECTOMY/DECOMPRESSION MICRODISCECTOMY  Lumbar Four - Five;  Surgeon: Dawley, Lani BROCKS, DO;  Location: [facility] OR;  Service: Neurosurgery;  Laterality: N/A;   TOOTH EXTRACTION     local anesthestic only    Family Psychiatric History: See below in family history  Family History:  Family History  Problem Relation Age of Onset   Depression Mother    Anxiety disorder Mother    Heart disease Mother    Skin cancer Mother    Scoliosis Mother    Alcohol abuse Father    Bipolar disorder Father    Depression Father    Osteochondroma Father    Osteochondroma Sister    COPD Maternal Grandmother    Scoliosis Maternal Grandmother    COPD Maternal Grandfather    Skin cancer Maternal Grandfather    Parkinson's disease Maternal Grandfather    Arthritis Paternal Grandmother    Diabetes Paternal Grandmother    Bipolar disorder Paternal Grandfather    Osteochondroma Brother    Cancer Neg Hx    Breast cancer Neg Hx     Social History:  Social History   Socioeconomic History   Marital status: Married    Spouse name: Latoya Mora   Number of children: Not on file   Years of education: Not on file   Highest education level: Not on file  Occupational History   Not on file  Tobacco Use   Smoking status: Never   Smokeless tobacco: Never  Vaping Use   Vaping status: Never Used  Substance and Sexual Activity   Alcohol use: Not Currently   Drug use: No   Sexual activity: Yes    Birth control/protection: None  Other Topics Concern   Not on file  Social History Narrative   Not on file   Social Drivers of Health   Financial Resource Strain: Low Risk  (05/21/2018)   Overall Financial Resource Strain (CARDIA)    Difficulty of Paying Living Expenses: Not hard at all  Food  Insecurity: No Food Insecurity (10/31/2023)   Hunger Vital Sign    Worried About Running Out of Food in the Last Year: Never true    Ran Out of Food in the Last Year: Never true  Transportation Needs: No Transportation Needs (10/31/2023)   PRAPARE - Administrator, Civil Service (Medical): No    Lack of Transportation (Non-Medical): No  Physical Activity: Not on file  Stress: Stress Concern Present (05/21/2018)   [employer] of Occupational Health - Occupational Stress Questionnaire    Feeling of Stress : To some extent  Social Connections: Not on file    Allergies:  Allergies  Allergen Reactions   Etodolac Nausea Only   Meloxicam Other (See Comments)    Abdominal pain   Flexeril [Cyclobenzaprine] Nausea Only   Nsaids Other (See Comments)    Stomach pain. Pt  ok with Motrin    Prozac  [Fluoxetine  Hcl] Other (See Comments)    Felt bad/awful   Hydroxyzine  Nausea Only   Tizanidine  Palpitations    Metabolic Disorder Labs: Reviewed with patient Lab Results  Component Value Date   HGBA1C 5.7 (H) 10/14/2023   Lab Results  Component Value Date   PROLACTIN 8.2 10/14/2023   Lab Results  Component Value Date   CHOL 145 10/14/2023   TRIG 90 10/14/2023   HDL 38 (L) 10/14/2023   CHOLHDL 3.8 10/14/2023   LDLCALC 90 10/14/2023   LDLCALC 90 10/14/2023   Lab Results  Component Value Date   TSH 1.660 10/14/2023   TSH 1.580 10/14/2023    Current Medications: Current Outpatient Medications  Medication Sig Dispense Refill   QUEtiapine  (SEROQUEL  XR) 50 MG TB24 24 hr tablet Take 1 tablet (50 mg total) by mouth at bedtime. 90 tablet 0   QUEtiapine  (SEROQUEL ) 25 MG tablet Take 1 tablet (25 mg total) by mouth at bedtime. 90 tablet 0   venlafaxine  XR (EFFEXOR  XR) 75 MG 24 hr capsule Take 1 capsule (75 mg total) by mouth daily with breakfast. 30 capsule 0   acetaminophen  (TYLENOL ) 500 MG tablet Take 1,000 mg by mouth every 6 (six) hours as needed (general pain).      ARIPiprazole  (ABILIFY ) 15 MG tablet Take 1 tablet (15 mg total) by mouth daily. 30 tablet 1   baclofen  (LIORESAL ) 10 MG tablet Take 1 tablet (10 mg total) by mouth 3 (three) times daily. 30 each 0   diphenhydrAMINE -APAP, sleep, (EXCEDRIN PM PO) Take 2 tablets by mouth at bedtime as needed (general pain).     propranolol  (INDERAL ) 10 MG tablet Take 10 mg by mouth 3 (three) times daily.     No current facility-administered medications for this visit.   Musculoskeletal: Strength & Muscle Tone: Unable to assess via virtual visit Gait & Station: Unable to assess via virtual visit Patient leans: N/A  Psychiatric Specialty Exam: Review of Systems  Constitutional:        No other complaints voiced  Psychiatric/Behavioral:  Positive for dysphoric mood. Negative for hallucinations, self-injury, sleep disturbance (Reports sleeping too much) and suicidal ideas. The patient is nervous/anxious (Feels that anxiety has worsened since Efexxor less than 75 mg daily).        Reporting worsening episodes of anxiety with the reduction of anxiety  All other systems reviewed and are negative.   unknown if currently breastfeeding.There is no height or weight on file to calculate BMI.  General Appearance: Casual  Eye Contact:  Good  Speech:  Clear and Coherent and Normal Rate  Volume:  Normal  Mood:  Anxious and Dysphoric  Affect:  Congruent  Thought Process:  Coherent, Goal Directed, and Descriptions of Associations: Intact  Orientation:  Full (Time, Place, and Person)  Thought Content: Logical   Suicidal Thoughts:  No  Homicidal Thoughts:  No  Memory:  Immediate;   Good Recent;   Good Remote;   Good  Judgement:  Intact  Insight:  Present  Psychomotor Activity:  Normal  Concentration:  Concentration: Good and Attention Span: Good  Recall:  Good  Fund of Knowledge: Good  Language: Good  Akathisia:  No  Handed:  Right  AIMS (if indicated): not done  Assets:  Communication Skills Desire for  Improvement Housing Physical Health Resilience Transportation  ADL's:  Intact  Cognition: WNL  Sleep:  Fair   Screenings: AIMS    Flowsheet Row Office Visit from 10/14/2023 in  [facility]  AIMS Total Score 0   GAD-7    Flowsheet Row Office Visit from 10/29/2023 in [facility] Visit from 10/14/2023 in [facility] [place] Office Visit from 08/09/2023 in [facility] Office Visit from 07/12/2023 in [facility] Office Visit from 11/09/2021 in [facility]  Total GAD-7 Score 16 20 17 18 19    PHQ2-9    Flowsheet Row Office Visit from 10/31/2023 in [facility] [place] Office Visit from 10/29/2023 in [facility] Office Visit from 10[facility]14/2023 in [facility] [place] Office Visit from 08/09/2023 in [facility] Office Visit from 07/12/2023 in [facility]  PHQ-2 Total Score 4 4 5 6 6   PHQ-9 Total Score 13 13 17 22 22    Flowsheet Row ED from 05/17/2023 in [facility] ED to Hosp-Admission (Discharged) from 04/18/2023 in [facility] from 04/12/2023 in [facility] at [facility] RISK CATEGORY No Risk No Risk No Risk   Assessment and Plan:  Assessment: Patient seen and examined as noted above. Summary: Today Latoya Mora reports worsening anxiety and feels that it got even worse after going from Effexor  75 mg to 50 mg.  Reports she feels as if she is in an constant anxiety episode.  She denies suicidal/self-harm/homicidal ideations, psychosis, paranoia, and abnormal movements During visit she is dressed appropriate for age and weather.  She  is seated comfortably in view of camera with no noted distress.  She is alert/oriented x 4, calm/cooperative and mood is congruent with affect.  She spoke in a clear tone at moderate volume, and normal pace, with good eye contact.  Her thought process is coherent, relevant, and there is no indication that she is currently responding to internal/external stimuli or experiencing delusional thought content.    1. Severe episode of recurrent major depressive disorder, without psychotic features (HCC) (Primary) - venlafaxine  XR (EFFEXOR  XR) 75 MG 24 hr capsule; Take 1 capsule (75 mg total) by mouth daily with breakfast.  Dispense: 30 capsule; Refill: 0 - QUEtiapine  (SEROQUEL  XR) 50 MG TB24 24 hr tablet; Take 1 tablet (50 mg total) by mouth at bedtime.  Dispense: 90 tablet; Refill: 0  2. Generalized anxiety disorder with panic attacks - venlafaxine  XR (EFFEXOR  XR) 75 MG 24 hr capsule; Take 1 capsule (75 mg total) by mouth daily with breakfast.  Dispense: 30 capsule; Refill: 0 - QUEtiapine  (SEROQUEL  XR) 50 MG TB24 24 hr tablet; Take 1 tablet (50 mg total) by mouth at bedtime.  Dispense: 90 tablet; Refill: 0 - QUEtiapine  (SEROQUEL ) 25 MG tablet; Take 1 tablet (25 mg total) by mouth at bedtime.  Dispense: 90 tablet; Refill: 0  3. Insomnia, unspecified type - QUEtiapine  (SEROQUEL  XR) 50 MG TB24 24 hr tablet; Take 1 tablet (50 mg total) by mouth at bedtime.  Dispense: 90 tablet; Refill: 0   Plan: Medications: Meds ordered this encounter  Medications   venlafaxine  XR (EFFEXOR  XR) 75 MG 24 hr capsule    Sig: Take 1 capsule (75 mg total) by mouth daily with breakfast.    Dispense:  30 capsule    Refill:  0    Supervising Provider:  ARFEEN, SYED T [[id number]]   QUEtiapine  (SEROQUEL  XR) 50 MG TB24 24 hr tablet    Sig: Take 1 tablet (50 mg total) by mouth at bedtime.    Dispense:  90 tablet    Refill:  0    Supervising Provider:   CURRY PATERSON T [[id number]]   QUEtiapine  (SEROQUEL ) 25 MG tablet    Sig: Take 1  tablet (25 mg total) by mouth at bedtime.    Dispense:  90 tablet    Refill:  0    Supervising Provider:   CURRY PATERSON T [[id number]]    Labs:  Reviewed. Not indicated at this time  Other:  Keep scheduled appointment with Latoya Mora on 01/07/2024 at 2:00 PM for counseling/therapy.   Latoya Mora is instructed to call [phone], mobile crisis, or present to the nearest emergency room should she experience any suicidal/homicidal ideation, auditory/visual/hallucinations, or detrimental worsening of her mental health condition.   Latoya Mora has participated in the development of this treatment plan and verbalized her agreement with plan as listed.  Follow Up: Return in 2 weeks for medication management Call in the interim for any side-effects, decompensation, questions, or problems  Collaboration of Care: Collaboration of Care: Medication Management AEB Medication assessment, adjustment, and refill  Patient/Guardian was advised Release of Information must be obtained prior to any record release in order to collaborate their care with an outside provider. Patient/Guardian was advised if they have not already done so to contact the registration department to sign all necessary forms in order for us  to release information regarding their care.   Consent: Patient/Guardian gives verbal consent for treatment and assignment of benefits for services provided during this visit. Patient/Guardian expressed understanding and agreed to proceed.    Latoya Labriola, NP 01/02/2024, 2:36 PM

## 2024-01-07 ENCOUNTER — Ambulatory Visit (INDEPENDENT_AMBULATORY_CARE_PROVIDER_SITE_OTHER): Admitting: Psychiatry

## 2024-01-07 ENCOUNTER — Encounter (HOSPITAL_COMMUNITY): Payer: Self-pay | Admitting: Psychiatry

## 2024-01-07 DIAGNOSIS — F411 Generalized anxiety disorder: Secondary | ICD-10-CM | POA: Diagnosis not present
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
## 2024-01-08 NOTE — Addendum Note (Signed)
 Addended by: VONA LYNDEN RAMAN on: 01/08/2024 04:15 PM   Modules accepted: Level of Service

## 2024-01-27 ENCOUNTER — Encounter (HOSPITAL_COMMUNITY): Payer: Self-pay | Admitting: Registered Nurse

## 2024-01-27 ENCOUNTER — Encounter: Payer: Self-pay | Admitting: Physician Assistant

## 2024-01-27 ENCOUNTER — Telehealth (HOSPITAL_COMMUNITY): Admitting: Registered Nurse

## 2024-01-27 ENCOUNTER — Ambulatory Visit: Admitting: Physician Assistant

## 2024-01-27 VITALS — BP 118/78 | HR 98 | Temp 97.7°F | Ht 68.0 in | Wt 340.4 lb

## 2024-01-27 DIAGNOSIS — F411 Generalized anxiety disorder: Secondary | ICD-10-CM

## 2024-01-27 DIAGNOSIS — Z6841 Body Mass Index (BMI) 40.0 and over, adult: Secondary | ICD-10-CM

## 2024-01-27 DIAGNOSIS — F410 Panic disorder [episodic paroxysmal anxiety] without agoraphobia: Secondary | ICD-10-CM

## 2024-01-27 DIAGNOSIS — F429 Obsessive-compulsive disorder, unspecified: Secondary | ICD-10-CM

## 2024-01-27 DIAGNOSIS — F332 Major depressive disorder, recurrent severe without psychotic features: Secondary | ICD-10-CM | POA: Diagnosis not present

## 2024-01-27 DIAGNOSIS — G4700 Insomnia, unspecified: Secondary | ICD-10-CM | POA: Diagnosis not present

## 2024-01-27 MED ORDER — VENLAFAXINE HCL ER 75 MG PO CP24: 75.0000 mg | ORAL_CAPSULE | Freq: Every day | ORAL | 1 refills | Status: DC

## 2024-01-27 MED ORDER — PHENTERMINE HCL 37.5 MG PO TABS: 37.5000 mg | ORAL_TABLET | Freq: Every day | ORAL | 0 refills | Status: DC

## 2024-01-27 MED ORDER — QUETIAPINE FUMARATE 25 MG PO TABS: 25.0000 mg | ORAL_TABLET | Freq: Two times a day (BID) | ORAL | 1 refills | Status: DC | PRN

## 2024-01-27 MED ORDER — QUETIAPINE FUMARATE 25 MG PO TABS: 25.0000 mg | ORAL_TABLET | Freq: Every day | ORAL | 1 refills | Status: DC

## 2024-01-27 MED ORDER — FLUVOXAMINE MALEATE 25 MG PO TABS: 25.0000 mg | ORAL_TABLET | Freq: Every day | ORAL | 1 refills | Status: DC

## 2024-01-27 MED ORDER — QUETIAPINE FUMARATE ER 50 MG PO TB24: 100.0000 mg | ORAL_TABLET | Freq: Every day | ORAL | 1 refills | Status: DC

## 2024-01-27 NOTE — Patient Instructions (Signed)

## 2024-01-27 NOTE — Progress Notes (Signed)
 [facility] MD/PA/NP OP Progress Note  01/27/2024 10:09 AM Latoya Mora  MRN:  [id number]  Virtual Visit via Video Note  I connected with Latoya Mora on 01/27/24 at  8:30 AM EDT by a video enabled telemedicine application and verified that I am speaking with the correct person using two identifiers.  Location: Patient: Home Provider: [facility] [place]   I discussed the limitations of evaluation and management by telemedicine and the availability of in person appointments. The patient expressed understanding and agreed to proceed.    I discussed the assessment and treatment plan with the patient. The patient was provided an opportunity to ask questions and all were answered. The patient agreed with the plan and demonstrated an understanding of the instructions.   The patient was advised to call back or seek an in-person evaluation if the symptoms worsen or if the condition fails to improve as anticipated.  I provided 30 minutes of non-face-to-face time during this encounter.   [name], NP    Chief Complaint:  Chief Complaint  Patient presents with   Follow-up    Medication management   HPI: Latoya Mora 28 y.o. female presents today for medication management follow up.  She is seen via virtual video visit by this provider, and chart reviewed on 01/27/24.  Her psychiatric history is significant for major depressive disorder recurrent and general anxiety disorder with panic attacks.  Her mental health is currently managed with Effexor  XR 75 mg daily, Seroquel  XR 50 mg nightly, and Seroquel  25 mg twice daily as needed.  She reports she does not feel that her current medication regimen is effectively managing her mental health.  Reports still continues to have multiple episodes of anxiousness/anxiety attacks per week.  She also reports that she feels she may have OCD related to repetitive intrusive thoughts that occurs over and over.   sometimes I get these thoughts in my head that  will not go away or things that are no big deal or no concern.  For instance sometimes I feel people can hear what I am thinking.  The other day I fell in a parking lot and felt somebody had recorded it and posted online and just kept having this thought over and over.  The other day I was in the store looking at press on nails and I got this thought that if I put him on something that would happen to me so I want put them on.  Reports Seroquel  25 mg makes her feel sleepy so she does not like taking it during the day.  She denies suicidal/self-harm/homicidal ideation, psychosis, and abnormal movements.  Treatment options discussed: She has been on multiple psychotropic medications to assist with anxiety as needed reports BuSpar , Vistaril , Proprinal, Prozac , gabapentin , did not work for her.  Reports Seroquel  does work but does not like taking it related to making her feel sleepy after taking.  Discussed decreasing as needed Seroquel  to 12.5 mg twice daily as needed or adding another medication to assist with anxiety.  Wanted another medication.  Discussed Fluvoxamine  added as an adjunct to the Effexor  to see if he would get anxiety more controllable along with assisting with intrusive thoughts.  Continue therapy sessions with Latoya Rubinstein, LCSW  Recommended the following: Continue Effexor  XR 75 mg daily, increase Seroquel  XR to 100 mg nightly, continue Seroquel  25 mg twice daily as needed, added Fluvoxamine  25 mg daily.  Discussed efficacy/side effects of Fluvoxamine , also added educational information to AVS.  She voices understanding/agreement  to information/recommendations given to her today.       Visit Diagnosis:    ICD-10-CM   1. Obsessive-compulsive disorder, unspecified type  F42.9 fluvoxaMINE  (LUVOX ) 25 MG tablet    2. Generalized anxiety disorder with panic attacks  F41.1 QUEtiapine  (SEROQUEL  XR) 50 MG TB24 24 hr tablet   F41.0 venlafaxine  XR (EFFEXOR  XR) 75 MG 24 hr capsule    fluvoxaMINE   (LUVOX ) 25 MG tablet    QUEtiapine  (SEROQUEL ) 25 MG tablet    DISCONTINUED: QUEtiapine  (SEROQUEL ) 25 MG tablet    3. Severe episode of recurrent major depressive disorder, without psychotic features (HCC)  F33.2 QUEtiapine  (SEROQUEL  XR) 50 MG TB24 24 hr tablet    venlafaxine  XR (EFFEXOR  XR) 75 MG 24 hr capsule    fluvoxaMINE  (LUVOX ) 25 MG tablet    4. Insomnia, unspecified type  G47.00 QUEtiapine  (SEROQUEL  XR) 50 MG TB24 24 hr tablet      Past Psychiatric History: Major depressive disorder, general anxiety disorder  Prior psychotropic medications:  Prozac , Zoloft , Cymbalta, Celexa, Wellbutrin , Lexapro , Buspar , Vistaril , Xanax, and Klonopin , Gabapentin .   Past Medical History:  Past Medical History:  Diagnosis Date   Allergy    Anemia    during pregnancies   Anxiety    Anxiety and depression 09/18/2016   effexor  150mg >11/04/17 wants to wean off  Significant anxiety and depression, postpartum--consider meds at pregnancy end or immed. Pp, safe with breast feeding only     Depression    Elevated liver enzymes    with both pregnancies, no problems after pregnancies   Frequent headaches    no longer having frequent headaches   History of pre-eclampsia    Morbid obesity (HCC) 04/03/2017   PCOS (polycystic ovarian syndrome)     Past Surgical History:  Procedure Laterality Date   LAPAROSCOPIC SALPINGO OOPHERECTOMY Left 02/23/2020   Procedure: LAPAROSCOPIC SALPINGO OOPHORECTOMY;  Surgeon: Herchel Gloris LABOR, MD;  Location: [facility] OR;  Service: Gynecology;  Laterality: Left;   LUMBAR LAMINECTOMY/DECOMPRESSION MICRODISCECTOMY N/A 04/19/2023   Procedure: LUMBAR LAMINECTOMY/DECOMPRESSION MICRODISCECTOMY  Lumbar Four - Five;  Surgeon: Dawley, Lani BROCKS, DO;  Location: [facility] OR;  Service: Neurosurgery;  Laterality: N/A;   TOOTH EXTRACTION     local anesthestic only    Family Psychiatric History: See below in family history  Family History:  Family History  Problem Relation Age of Onset    Depression Mother    Anxiety disorder Mother    Heart disease Mother    Skin cancer Mother    Scoliosis Mother    Alcohol abuse Father    Bipolar disorder Father    Depression Father    Osteochondroma Father    Osteochondroma Sister    Osteochondroma Brother    Anxiety disorder Maternal Grandfather    COPD Maternal Grandfather    Skin cancer Maternal Grandfather    Parkinson's disease Maternal Grandfather    Anxiety disorder Maternal Grandmother    COPD Maternal Grandmother    Scoliosis Maternal Grandmother    Bipolar disorder Paternal Grandfather    Arthritis Paternal Grandmother    Diabetes Paternal Grandmother    Cancer Neg Hx    Breast cancer Neg Hx     Social History:  Social History   Socioeconomic History   Marital status: Married    Spouse name: Shantese Raven   Number of children: Not on file   Years of education: Not on file   Highest education level: Not on file  Occupational History   Not on file  Tobacco Use   Smoking status: Never   Smokeless tobacco: Never  Vaping Use   Vaping status: Never Used  Substance and Sexual Activity   Alcohol use: Not Currently   Drug use: No   Sexual activity: Yes    Birth control/protection: None  Other Topics Concern   Not on file  Social History Narrative   Not on file   Social Drivers of Health   Financial Resource Strain: Low Risk  (05/21/2018)   Overall Financial Resource Strain (CARDIA)    Difficulty of Paying Living Expenses: Not hard at all  Food Insecurity: No Food Insecurity (10/31/2023)   Hunger Vital Sign    Worried About Running Out of Food in the Last Year: Never true    Ran Out of Food in the Last Year: Never true  Transportation Needs: No Transportation Needs (10/31/2023)   PRAPARE - Administrator, Civil Service (Medical): No    Lack of Transportation (Non-Medical): No  Physical Activity: Not on file  Stress: Stress Concern Present (05/21/2018)   [employer] of Occupational Health  - Occupational Stress Questionnaire    Feeling of Stress : To some extent  Social Connections: Not on file    Allergies:  Allergies  Allergen Reactions   Etodolac Nausea Only   Meloxicam Other (See Comments)    Abdominal pain   Flexeril [Cyclobenzaprine] Nausea Only   Nsaids Other (See Comments)    Stomach pain. Pt ok with Motrin    Prozac  [Fluoxetine  Hcl] Other (See Comments)    Felt bad/awful   Hydroxyzine  Nausea Only   Tizanidine  Palpitations    Metabolic Disorder Labs: Reviewed with patient Lab Results  Component Value Date   HGBA1C 5.7 (H) 10/14/2023   Lab Results  Component Value Date   PROLACTIN 8.2 10/14/2023   Lab Results  Component Value Date   CHOL 145 10/14/2023   TRIG 90 10/14/2023   HDL 38 (L) 10/14/2023   CHOLHDL 3.8 10/14/2023   LDLCALC 90 10/14/2023   LDLCALC 90 10/14/2023   Lab Results  Component Value Date   TSH 1.660 10/14/2023   TSH 1.580 10/14/2023    Current Medications: Current Outpatient Medications  Medication Sig Dispense Refill   fluvoxaMINE  (LUVOX ) 25 MG tablet Take 1 tablet (25 mg total) by mouth at bedtime. 30 tablet 1   acetaminophen  (TYLENOL ) 500 MG tablet Take 1,000 mg by mouth every 6 (six) hours as needed (general pain).     ARIPiprazole  (ABILIFY ) 15 MG tablet Take 1 tablet (15 mg total) by mouth daily. 30 tablet 1   baclofen  (LIORESAL ) 10 MG tablet Take 1 tablet (10 mg total) by mouth 3 (three) times daily. 30 each 0   diphenhydrAMINE -APAP, sleep, (EXCEDRIN PM PO) Take 2 tablets by mouth at bedtime as needed (general pain).     propranolol  (INDERAL ) 10 MG tablet Take 10 mg by mouth 3 (three) times daily. (Patient not taking: Reported on 01/07/2024)     QUEtiapine  (SEROQUEL  XR) 50 MG TB24 24 hr tablet Take 2 tablets (100 mg total) by mouth at bedtime. 60 tablet 1   QUEtiapine  (SEROQUEL ) 25 MG tablet Take 1 tablet (25 mg total) by mouth 2 (two) times daily as needed (anxiety). 60 tablet 1   venlafaxine  XR (EFFEXOR  XR) 75 MG 24 hr  capsule Take 1 capsule (75 mg total) by mouth daily with breakfast. 30 capsule 1   No current facility-administered medications for this visit.   Musculoskeletal: Strength & Muscle Tone: Unable to  assess via virtual visit Gait & Station: Unable to assess via virtual visit Patient leans: N/A  Psychiatric Specialty Exam: Review of Systems  Constitutional:        No other complaints voiced  Psychiatric/Behavioral:  Positive for dysphoric mood. Negative for hallucinations, self-injury, sleep disturbance (Improved) and suicidal ideas. The patient is nervous/anxious.        Reporting worsening episodes of anxiety with the reduction of anxiety  All other systems reviewed and are negative.   unknown if currently breastfeeding.There is no height or weight on file to calculate BMI.  General Appearance: Casual  Eye Contact:  Good  Speech:  Clear and Coherent and Normal Rate  Volume:  Normal  Mood:  Anxious  Affect:  Appropriate and Congruent  Thought Process:  Coherent, Goal Directed, and Descriptions of Associations: Intact  Orientation:  Full (Time, Place, and Person)  Thought Content: Logical   Suicidal Thoughts:  No  Homicidal Thoughts:  No  Memory:  Immediate;   Good Recent;   Good Remote;   Good  Judgement:  Intact  Insight:  Present  Psychomotor Activity:  Normal  Concentration:  Concentration: Good and Attention Span: Good  Recall:  Good  Fund of Knowledge: Good  Language: Good  Akathisia:  No  Handed:  Right  AIMS (if indicated): not done  Assets:  Communication Skills Desire for Improvement Housing Physical Health Resilience Transportation  ADL's:  Intact  Cognition: WNL  Sleep:  Good   Screenings: AIMS    Flowsheet Row Office Visit from 10/14/2023 in [facility] [place]  AIMS Total Score 0   GAD-7    Flowsheet Row Counselor from 01/07/2024 in [facility] [place] Office Visit from 10/29/2023  in [facility] for [facility] Office Visit from 10/14/2023 in [facility] [place] Office Visit from 08/09/2023 in [facility] [place] [facility] Office Visit from 07/12/2023 in [facility] [place] [facility]  Total GAD-7 Score 19 16 20 17 18    PHQ2-9    Flowsheet Row Counselor from 01/07/2024 in [facility] [place] Office Visit from 10/31/2023 in [facility] [place] [facility] [place] Office Visit from 10/29/2023 in [facility] at [facility] Office Visit from 10/14/2023 in [facility] [place] Office Visit from 08/09/2023 in [facility]  PHQ-2 Total Score 5 4 4 5 6   PHQ-9 Total Score 18 13 13 17 22    Flowsheet Row Counselor from 01/07/2024 in [facility] [place] ED from 05/17/2023 in [facility] Emergency Department at [facility] ED to Hosp-Admission (Discharged) from 04/18/2023 in [facility]  C-SSRS RISK CATEGORY No Risk No Risk No Risk   Assessment and Plan:  Assessment: Patient seen and examined as noted above. Summary: Today Latoya Mora reports there has been some improvement but does not feel she is where she needs to be.  Reports continued episodes of anxiousness.  Does not like taking as needed Seroquel  related to it making her feel tired.  Also reporting repetitive intrusive thoughts.  She denies suicidal/self-harm/homicidal ideation, psychosis, paranoia, and abnormal movements. During visit she is dressed appropriate for age and weather.  She is seated comfortably in view of camera with no noted distress.  She is alert/oriented x 4, calm/cooperative and mood  is congruent with affect.  She spoke in a clear tone at moderate volume, and normal pace, with good eye contact.  Her thought  process is coherent, relevant, and there is no indication that she is currently responding to internal/external stimuli or experiencing delusional thought content.    1. Generalized anxiety disorder with panic attacks - QUEtiapine  (SEROQUEL  XR) 50 MG TB24 24 hr tablet; Take 2 tablets (100 mg total) by mouth at bedtime.  Dispense: 60 tablet; Refill: 1 - venlafaxine  XR (EFFEXOR  XR) 75 MG 24 hr capsule; Take 1 capsule (75 mg total) by mouth daily with breakfast.  Dispense: 30 capsule; Refill: 1 - fluvoxaMINE  (LUVOX ) 25 MG tablet; Take 1 tablet (25 mg total) by mouth at bedtime.  Dispense: 30 tablet; Refill: 1 - QUEtiapine  (SEROQUEL ) 25 MG tablet; Take 1 tablet (25 mg total) by mouth 2 (two) times daily as needed (anxiety).  Dispense: 60 tablet; Refill: 1  2. Severe episode of recurrent major depressive disorder, without psychotic features (HCC) - QUEtiapine  (SEROQUEL  XR) 50 MG TB24 24 hr tablet; Take 2 tablets (100 mg total) by mouth at bedtime.  Dispense: 60 tablet; Refill: 1 - venlafaxine  XR (EFFEXOR  XR) 75 MG 24 hr capsule; Take 1 capsule (75 mg total) by mouth daily with breakfast.  Dispense: 30 capsule; Refill: 1 - fluvoxaMINE  (LUVOX ) 25 MG tablet; Take 1 tablet (25 mg total) by mouth at bedtime.  Dispense: 30 tablet; Refill: 1  3. Insomnia, unspecified type - QUEtiapine  (SEROQUEL  XR) 50 MG TB24 24 hr tablet; Take 2 tablets (100 mg total) by mouth at bedtime.  Dispense: 60 tablet; Refill: 1  4. Obsessive-compulsive disorder, unspecified type (Primary) - fluvoxaMINE  (LUVOX ) 25 MG tablet; Take 1 tablet (25 mg total) by mouth at bedtime.  Dispense: 30 tablet; Refill: 1   Plan: Medications: Meds ordered this encounter  Medications   DISCONTD: QUEtiapine  (SEROQUEL ) 25 MG tablet    Sig: Take 1 tablet (25 mg total) by mouth at bedtime.    Dispense:  30 tablet    Refill:  1    Supervising Provider:   ARFEEN, SYED T [[id number]]   QUEtiapine  (SEROQUEL  XR) 50 MG TB24 24 hr tablet    Sig: Take 2  tablets (100 mg total) by mouth at bedtime.    Dispense:  60 tablet    Refill:  1    Supervising Provider:   ARFEEN, SYED T [[id number]]   venlafaxine  XR (EFFEXOR  XR) 75 MG 24 hr capsule    Sig: Take 1 capsule (75 mg total) by mouth daily with breakfast.    Dispense:  30 capsule    Refill:  1    Supervising Provider:   ARFEEN, SYED T [[id number]]   fluvoxaMINE  (LUVOX ) 25 MG tablet    Sig: Take 1 tablet (25 mg total) by mouth at bedtime.    Dispense:  30 tablet    Refill:  1    Supervising Provider:   ARFEEN, SYED T [[id number]]   QUEtiapine  (SEROQUEL ) 25 MG tablet    Sig: Take 1 tablet (25 mg total) by mouth 2 (two) times daily as needed (anxiety).    Dispense:  60 tablet    Refill:  1    Supervising Provider:   CURRY PATERSON T [[id number]]    Labs:  Reviewed. Not indicated at this time  Other:  Continue counseling/therapy with Latoya Rubinstein, LCSW. Latoya Mora is instructed to call [phone], mobile crisis, or present to the nearest emergency room should she experience any suicidal/homicidal  ideation, auditory/visual/hallucinations, or detrimental worsening of her mental health condition.   Latoya Mora has participated in the development of this treatment plan and verbalized her agreement with plan as listed.  Follow Up: Return in 1 month for medication management Call in the interim for any side-effects, decompensation, questions, or problems  Collaboration of Care: Collaboration of Care: Medication Management AEB medication assessment, adjustment, refills, and added Fluvoxamine   Patient/Guardian was advised Release of Information must be obtained prior to any record release in order to collaborate their care with an outside provider. Patient/Guardian was advised if they have not already done so to contact the registration department to sign all necessary forms in order for us  to release information regarding their care.   Consent: Patient/Guardian gives verbal consent for treatment and assignment of  benefits for services provided during this visit. Patient/Guardian expressed understanding and agreed to proceed.    Dan Scearce, NP 01/27/2024, 10:09 AM

## 2024-01-27 NOTE — Assessment & Plan Note (Signed)
 Lengthy discussion regarding healthy diet and exercise. Goals made with patient today include decreasing processed foods, continuing with adequate fruit and vegetable intake, increasing lean meats in her diet, and avoiding sugary beverages. As for exercise patient was advised 150 minutes weekly of moderate activity. Goals at this visit were to start with 5 minutes of walking outdoors every day, with the goal to gradually increase to 30 minutes daily. Additionally we discussed benefits of medications such as phentermine  for weight loss. Patient interested in starting this medication. Discussed risks to this medication including, increased heart rate, hypertension, insomnia, irritability, headache, diarrhea, tremors, and increased anxiety. As patient is currently being treated for anxiety we discussed potential for worsening symptoms. Discussed potential for dependency and tolerance of this medication, patient understands use of this medication is limited to 3-6 months. Patient to alert office for any side effects. Patient to follow up in 3 months.

## 2024-01-27 NOTE — Progress Notes (Signed)
 Established Patient Office Visit  Subjective   Patient ID: Latoya Mora, female    DOB: 19-Mar-1996  Age: 28 y.o. MRN: [id number]  No chief complaint on file.   Patient presents today to discuss weight loss options. She reports her husband has recently started a medication that his been beneficial for his weight loss and she is also interested in this medication. Patient endorses diet heavy in processed foods but reports she does eat plenty of fruits and vegetables, approximately 2-3 vegetables daily and 1-2 fruits. She endorses red meat and chicken breast for protein. States she drinks mostly water, but does like an iced coffee in the morning. She relates exercise is minimal currently. She reports daily house cleaning and states most of her activity comes from moderate to intense cleaning. Patient currently following with behavioral health and a therapist for management of anxiety and depression.      Review of Systems  Constitutional:  Negative for fever, malaise/fatigue and weight loss.  Respiratory:  Negative for shortness of breath.   Cardiovascular:  Negative for chest pain and palpitations.  Neurological:  Negative for dizziness and headaches.  Psychiatric/Behavioral:  Positive for depression. Negative for substance abuse and suicidal ideas. The patient is nervous/anxious and has insomnia.       Objective:     BP 118/78   Pulse 98   Temp 97.7 F (36.5 C)   Ht 5' 8 (1.727 m)   Wt (!) 340 lb 6.4 oz (154.4 kg)   SpO2 97%   BMI 51.76 kg/m    Physical Exam Constitutional:      General: She is not in acute distress.    Appearance: Normal appearance. She is obese. She is not ill-appearing.  HENT:     Head: Normocephalic and atraumatic.     Mouth/Throat:     Mouth: Mucous membranes are moist.     Pharynx: Oropharynx is clear.  Eyes:     Extraocular Movements: Extraocular movements intact.     Conjunctiva/sclera: Conjunctivae normal.  Cardiovascular:     Rate and  Rhythm: Normal rate and regular rhythm.     Heart sounds: Normal heart sounds. No murmur heard. Pulmonary:     Effort: Pulmonary effort is normal.     Breath sounds: Normal breath sounds. No wheezing or rales.  Skin:    General: Skin is warm and dry.  Neurological:     General: No focal deficit present.     Mental Status: She is alert and oriented to person, place, and time.  Psychiatric:        Mood and Affect: Mood normal.        Behavior: Behavior normal.      No results found for any visits on 01/27/24.  The ASCVD Risk score (Arnett DK, et al., 2019) failed to calculate for the following reasons:   The 2019 ASCVD risk score is only valid for ages 20 to 28    Assessment & Plan:   Return in about 3 months (around 04/28/2024) for weight loss.   BMI 50.0-59.9, adult [facility]) Assessment & Plan: Lengthy discussion regarding healthy diet and exercise. Goals made with patient today include decreasing processed foods, continuing with adequate fruit and vegetable intake, increasing lean meats in her diet, and avoiding sugary beverages. As for exercise patient was advised 150 minutes weekly of moderate activity. Goals at this visit were to start with 5 minutes of walking outdoors every day, with the goal to gradually increase to 30 minutes daily.  Additionally we discussed benefits of medications such as phentermine  for weight loss. Patient interested in starting this medication. Discussed risks to this medication including, increased heart rate, hypertension, insomnia, irritability, headache, diarrhea, tremors, and increased anxiety. As patient is currently being treated for anxiety we discussed potential for worsening symptoms. Discussed potential for dependency and tolerance of this medication, patient understands use of this medication is limited to 3-6 months. Patient to alert office for any side effects. Patient to follow up in 3 months.   Orders: -     Phentermine  HCl; Take 1 tablet (37.5  mg total) by mouth daily before breakfast.  Dispense: 30 tablet; Refill: 0    Farhaan Mabee, PA-C

## 2024-01-30 ENCOUNTER — Inpatient Hospital Stay: Attending: Hematology

## 2024-02-05 ENCOUNTER — Encounter: Payer: Self-pay | Admitting: *Deleted

## 2024-02-06 ENCOUNTER — Inpatient Hospital Stay: Admitting: Oncology

## 2024-02-27 ENCOUNTER — Encounter (HOSPITAL_COMMUNITY): Payer: Self-pay | Admitting: Registered Nurse

## 2024-02-27 ENCOUNTER — Telehealth (HOSPITAL_COMMUNITY): Payer: Self-pay | Admitting: *Deleted

## 2024-02-27 ENCOUNTER — Telehealth (INDEPENDENT_AMBULATORY_CARE_PROVIDER_SITE_OTHER): Admitting: Registered Nurse

## 2024-02-27 DIAGNOSIS — F429 Obsessive-compulsive disorder, unspecified: Secondary | ICD-10-CM | POA: Diagnosis not present

## 2024-02-27 DIAGNOSIS — F411 Generalized anxiety disorder: Secondary | ICD-10-CM

## 2024-02-27 DIAGNOSIS — F410 Panic disorder [episodic paroxysmal anxiety] without agoraphobia: Secondary | ICD-10-CM

## 2024-02-27 DIAGNOSIS — G4700 Insomnia, unspecified: Secondary | ICD-10-CM

## 2024-02-27 DIAGNOSIS — F332 Major depressive disorder, recurrent severe without psychotic features: Secondary | ICD-10-CM | POA: Diagnosis not present

## 2024-02-27 MED ORDER — FLUVOXAMINE MALEATE 50 MG PO TABS: 50.0000 mg | ORAL_TABLET | Freq: Every day | ORAL | 1 refills | Status: DC

## 2024-02-27 MED ORDER — QUETIAPINE FUMARATE 25 MG PO TABS: 25.0000 mg | ORAL_TABLET | Freq: Two times a day (BID) | ORAL | 1 refills | Status: DC | PRN

## 2024-02-27 MED ORDER — VENLAFAXINE HCL ER 75 MG PO CP24: 75.0000 mg | ORAL_CAPSULE | Freq: Every day | ORAL | 1 refills | Status: DC

## 2024-02-27 MED ORDER — QUETIAPINE FUMARATE ER 50 MG PO TB24: 100.0000 mg | ORAL_TABLET | Freq: Every day | ORAL | 1 refills | Status: DC

## 2024-02-27 NOTE — Progress Notes (Signed)
 [facility] MD/PA/NP OP Progress Note  02/27/2024 11:20 AM Latoya Mora  MRN:  [id number]  Virtual Visit via Video Note  I connected with Latoya Mora on 02/27/24 at 11:00 AM EDT by a video enabled telemedicine application and verified that I am speaking with the correct person using two identifiers.  Location: Patient: Home Provider: [facility], [place]   I discussed the limitations of evaluation and management by telemedicine and the availability of in person appointments. The patient expressed understanding and agreed to proceed.    I discussed the assessment and treatment plan with the patient. The patient was provided an opportunity to ask questions and all were answered. The patient agreed with the plan and demonstrated an understanding of the instructions.   The patient was advised to call back or seek an in-person evaluation if the symptoms worsen or if the condition fails to improve as anticipated.  I provided 30 minutes of non-face-to-face time during this encounter.   [name], NP    Chief Complaint:  Chief Complaint  Patient presents with   Follow-up    Medication management   HPI: Latoya Mora 28 y.o. female presents today for medication management follow up.  She is seen via virtual video visit by this provider, and chart reviewed on 02/27/24.  Her psychiatric history is significant for major depressive disorder recurrent and general anxiety disorder with panic attacks.  Her mental health is currently managed with Effexor  XR 75 mg daily, Seroquel  XR 50 mg nightly, and Seroquel  25 mg twice daily as needed, and Luvox  25 mg daily.  Reports tolerating current medications without adverse reaction.  Reports she has notice notable improvement depression, anxiety, and obsessive thoughts.  States she hasn't had to use the Seroquel  25 mg much.  She states Since I've been on the Luvox  I've seen a difference.  I think this combination of medicines are the ones for me.  When I first  came in I was at 45%.  Now I' at 75%.  Reports she is eating and sleeping without difficulty.   Today she denies suicidal/self-harm/homicidal ideation, psychosis, paranoia, and abnormal movement.    Treatment options discussed: Since notable improvement with Luvox  but not at 100% increase to see if will help to get to 80-100%.  Agrees to increase of Luvox .  Continues counseling/therapy with Peggy Bynum, LCSW  Recommended the following: Continue Effexor  XR 75 mg daily, Seroquel  XR to 100 mg nightly, Seroquel  25 mg twice daily as needed, and increase Luvox  50 mg daily.  She voices understanding/agreement to information/recommendations given to her today.       Visit Diagnosis:    ICD-10-CM   1. Severe episode of recurrent major depressive disorder, without psychotic features (HCC)  F33.2 QUEtiapine  (SEROQUEL  XR) 50 MG TB24 24 hr tablet    fluvoxaMINE  (LUVOX ) 50 MG tablet    venlafaxine  XR (EFFEXOR  XR) 75 MG 24 hr capsule    2. Generalized anxiety disorder with panic attacks  F41.1 QUEtiapine  (SEROQUEL  XR) 50 MG TB24 24 hr tablet   F41.0 fluvoxaMINE  (LUVOX ) 50 MG tablet    venlafaxine  XR (EFFEXOR  XR) 75 MG 24 hr capsule    QUEtiapine  (SEROQUEL ) 25 MG tablet    3. Insomnia, unspecified type  G47.00 QUEtiapine  (SEROQUEL  XR) 50 MG TB24 24 hr tablet    4. Obsessive-compulsive disorder, unspecified type  F42.9 fluvoxaMINE  (LUVOX ) 50 MG tablet      Past Psychiatric History: Major depressive disorder, general anxiety disorder  Prior psychotropic medications:  Prozac , Zoloft ,  Cymbalta, Celexa, Wellbutrin , Lexapro , Buspar , Vistaril , Xanax, and Klonopin , Gabapentin .   Past Medical History:  Past Medical History:  Diagnosis Date   Allergy    Anemia    during pregnancies   Anxiety    Anxiety and depression 09/18/2016   effexor  150mg >11/04/17 wants to wean off  Significant anxiety and depression, postpartum--consider meds at pregnancy end or immed. Pp, safe with breast feeding only      Depression    Elevated liver enzymes    with both pregnancies, no problems after pregnancies   Frequent headaches    no longer having frequent headaches   History of pre-eclampsia    Morbid obesity (HCC) 04/03/2017   PCOS (polycystic ovarian syndrome)     Past Surgical History:  Procedure Laterality Date   LAPAROSCOPIC SALPINGO OOPHERECTOMY Left 02/23/2020   Procedure: LAPAROSCOPIC SALPINGO OOPHORECTOMY;  Surgeon: Herchel Gloris LABOR, MD;  Location: [facility] OR;  Service: Gynecology;  Laterality: Left;   LUMBAR LAMINECTOMY/DECOMPRESSION MICRODISCECTOMY N/A 04/19/2023   Procedure: LUMBAR LAMINECTOMY/DECOMPRESSION MICRODISCECTOMY  Lumbar Four - Five;  Surgeon: Dawley, Lani BROCKS, DO;  Location: [facility] OR;  Service: Neurosurgery;  Laterality: N/A;   TOOTH EXTRACTION     local anesthestic only    Family Psychiatric History: See below in family history  Family History:  Family History  Problem Relation Age of Onset   Depression Mother    Anxiety disorder Mother    Heart disease Mother    Skin cancer Mother    Scoliosis Mother    Alcohol abuse Father    Bipolar disorder Father    Depression Father    Osteochondroma Father    Osteochondroma Sister    Osteochondroma Brother    Anxiety disorder Maternal Grandfather    COPD Maternal Grandfather    Skin cancer Maternal Grandfather    Parkinson's disease Maternal Grandfather    Anxiety disorder Maternal Grandmother    COPD Maternal Grandmother    Scoliosis Maternal Grandmother    Bipolar disorder Paternal Grandfather    Arthritis Paternal Grandmother    Diabetes Paternal Grandmother    Cancer Neg Hx    Breast cancer Neg Hx     Social History:  Social History   Socioeconomic History   Marital status: Married    Spouse name: Brighton Delio   Number of children: Not on file   Years of education: Not on file   Highest education level: Not on file  Occupational History   Not on file  Tobacco Use   Smoking status: Never   Smokeless tobacco:  Never  Vaping Use   Vaping status: Never Used  Substance and Sexual Activity   Alcohol use: Not Currently   Drug use: No   Sexual activity: Yes    Birth control/protection: None  Other Topics Concern   Not on file  Social History Narrative   Not on file   Social Drivers of Health   Financial Resource Strain: Low Risk  (05/21/2018)   Overall Financial Resource Strain (CARDIA)    Difficulty of Paying Living Expenses: Not hard at all  Food Insecurity: No Food Insecurity (10/31/2023)   Hunger Vital Sign    Worried About Running Out of Food in the Last Year: Never true    Ran Out of Food in the Last Year: Never true  Transportation Needs: No Transportation Needs (10/31/2023)   PRAPARE - Administrator, Civil Service (Medical): No    Lack of Transportation (Non-Medical): No  Physical Activity: Not on file  Stress: Stress Concern Present (05/21/2018)   [employer] of Occupational Health - Occupational Stress Questionnaire    Feeling of Stress : To some extent  Social Connections: Not on file    Allergies:  Allergies  Allergen Reactions   Etodolac Nausea Only   Meloxicam Other (See Comments)    Abdominal pain   Flexeril [Cyclobenzaprine] Nausea Only   Nsaids Other (See Comments)    Stomach pain. Pt ok with Motrin    Prozac  [Fluoxetine  Hcl] Other (See Comments)    Felt bad/awful   Hydroxyzine  Nausea Only   Tizanidine  Palpitations    Metabolic Disorder Labs: Reviewed with patient Lab Results  Component Value Date   HGBA1C 5.7 (H) 10/14/2023   Lab Results  Component Value Date   PROLACTIN 8.2 10/14/2023   Lab Results  Component Value Date   CHOL 145 10/14/2023   TRIG 90 10/14/2023   HDL 38 (L) 10/14/2023   CHOLHDL 3.8 10/14/2023   LDLCALC 90 10/14/2023   LDLCALC 90 10/14/2023   Lab Results  Component Value Date   TSH 1.660 10/14/2023   TSH 1.580 10/14/2023    Current Medications: Current Outpatient Medications  Medication Sig Dispense Refill    acetaminophen  (TYLENOL ) 500 MG tablet Take 1,000 mg by mouth every 6 (six) hours as needed (general pain).     baclofen  (LIORESAL ) 10 MG tablet Take 1 tablet (10 mg total) by mouth 3 (three) times daily. 30 each 0   diphenhydrAMINE -APAP, sleep, (EXCEDRIN PM PO) Take 2 tablets by mouth at bedtime as needed (general pain).     fluvoxaMINE  (LUVOX ) 50 MG tablet Take 1 tablet (50 mg total) by mouth at bedtime. 30 tablet 1   phentermine  (ADIPEX-P ) 37.5 MG tablet Take 1 tablet (37.5 mg total) by mouth daily before breakfast. 30 tablet 0   QUEtiapine  (SEROQUEL  XR) 50 MG TB24 24 hr tablet Take 2 tablets (100 mg total) by mouth at bedtime. 60 tablet 1   QUEtiapine  (SEROQUEL ) 25 MG tablet Take 1 tablet (25 mg total) by mouth 2 (two) times daily as needed (anxiety). 60 tablet 1   venlafaxine  XR (EFFEXOR  XR) 75 MG 24 hr capsule Take 1 capsule (75 mg total) by mouth daily with breakfast. 30 capsule 1   No current facility-administered medications for this visit.   Musculoskeletal: Strength & Muscle Tone: Unable to assess via virtual visit Gait & Station: Unable to assess via virtual visit Patient leans: N/A  Psychiatric Specialty Exam: Review of Systems  Constitutional:        No other complaints voiced  Psychiatric/Behavioral:  Positive for dysphoric mood (Improved). Negative for hallucinations, self-injury, sleep disturbance (Improved) and suicidal ideas (Stable). Agitation: Moood stable.The patient is nervous/anxious (Imprved).        Reporting worsening episodes of anxiety with the reduction of anxiety  All other systems reviewed and are negative.   unknown if currently breastfeeding.There is no height or weight on file to calculate BMI.  General Appearance: Casual  Eye Contact:  Good  Speech:  Clear and Coherent and Normal Rate  Volume:  Normal  Mood:  Anxious and Euthymic  Affect:  Appropriate and Congruent  Thought Process:  Coherent, Goal Directed, and Descriptions of Associations: Intact   Orientation:  Full (Time, Place, and Person)  Thought Content: Logical   Suicidal Thoughts:  No  Homicidal Thoughts:  No  Memory:  Immediate;   Good Recent;   Good Remote;   Good  Judgement:  Intact  Insight:  Present  Psychomotor  Activity:  Normal  Concentration:  Concentration: Good and Attention Span: Good  Recall:  Good  Fund of Knowledge: Good  Language: Good  Akathisia:  No  Handed:  Right  AIMS (if indicated): not done  Assets:  Communication Skills Desire for Improvement Housing Physical Health Resilience Transportation  ADL's:  Intact  Cognition: WNL  Sleep:  Good   Screenings: AIMS    Flowsheet Row Office Visit from 10/14/2023 in [facility] at [place]  AIMS Total Score 0   GAD-7    Flowsheet Row Office Visit from 01/27/2024 in [facility] Counselor from 01/07/2024 in [facility] at [place] Office Visit from 10/29/2023 in [facility] at [facility] Office Visit from 10/14/2023 in [facility] [place] Office Visit from 08/09/2023 in [facility] [place] [facility]  Total GAD-7 Score 14 19 16 20 17    PHQ2-9    Flowsheet Row Office Visit from 01/27/2024 in [facility] [facility] Counselor from 01/07/2024 in [facility] at [place] Office Visit from 10/31/2023 in [facility] [place] Office Visit from 10/29/2023 in [facility] for [facility] at [facility] Office Visit from 10/14/2023 in [facility] at [facility] Total Score 4 5 4 4 5   PHQ-9 Total Score 11 18 13 13 17    Flowsheet Row Counselor from 01/07/2024 in [facility] at [place] ED from 05/17/2023 in [facility] Department at [facility] ED to Hosp-Admission (Discharged)  from 04/18/2023 in [facility]  C-SSRS RISK CATEGORY No Risk No Risk No Risk   Assessment and Plan:  Assessment: Summary: Today Anaston Koehn reports notable improvement in depression, anxiety, mood, obsessive thoughts, and sleep.  She reports she is eating and sleeping without difficulty.  She denies suicidal/self-harm/homicidal ideation, psychosis, paranoia, and abnormal movements. During visit she is dressed appropriate for age and weather.  She is seated comfortably in view of camera with no noted distress.  She is alert/oriented x 4, calm/cooperative and mood is congruent with affect.  She spoke in a clear tone at moderate volume, and normal pace, with good eye contact.  Her thought process is coherent, relevant, and there is no indication that she is currently responding to internal/external stimuli or experiencing delusional thought content.    1. Generalized anxiety disorder with panic attacks - QUEtiapine  (SEROQUEL  XR) 50 MG TB24 24 hr tablet; Take 2 tablets (100 mg total) by mouth at bedtime.  Dispense: 60 tablet; Refill: 1 - fluvoxaMINE  (LUVOX ) 50 MG tablet; Take 1 tablet (50 mg total) by mouth at bedtime.  Dispense: 30 tablet; Refill: 1 - venlafaxine  XR (EFFEXOR  XR) 75 MG 24 hr capsule; Take 1 capsule (75 mg total) by mouth daily with breakfast.  Dispense: 30 capsule; Refill: 1 - QUEtiapine  (SEROQUEL ) 25 MG tablet; Take 1 tablet (25 mg total) by mouth 2 (two) times daily as needed (anxiety).  Dispense: 60 tablet; Refill: 1  2. Severe episode of recurrent major depressive disorder, without psychotic features (HCC) (Primary) - QUEtiapine  (SEROQUEL  XR) 50 MG TB24 24 hr tablet; Take 2 tablets (100 mg total) by mouth at bedtime.  Dispense: 60 tablet; Refill: 1 - fluvoxaMINE  (LUVOX ) 50 MG tablet; Take 1 tablet (50 mg total) by mouth at bedtime.  Dispense: 30 tablet; Refill: 1 -  venlafaxine  XR (EFFEXOR  XR) 75 MG 24 hr capsule; Take 1 capsule (75 mg total) by  mouth daily with breakfast.  Dispense: 30 capsule; Refill: 1  3. Insomnia, unspecified type - QUEtiapine  (SEROQUEL  XR) 50 MG TB24 24 hr tablet; Take 2 tablets (100 mg total) by mouth at bedtime.  Dispense: 60 tablet; Refill: 1  4. Obsessive-compulsive disorder, unspecified type - fluvoxaMINE  (LUVOX ) 50 MG tablet; Take 1 tablet (50 mg total) by mouth at bedtime.  Dispense: 30 tablet; Refill: 1   Plan: Medications: Meds ordered this encounter  Medications   QUEtiapine  (SEROQUEL  XR) 50 MG TB24 24 hr tablet    Sig: Take 2 tablets (100 mg total) by mouth at bedtime.    Dispense:  60 tablet    Refill:  1    Supervising Provider:   ARFEEN, SYED T [[id number]]   fluvoxaMINE  (LUVOX ) 50 MG tablet    Sig: Take 1 tablet (50 mg total) by mouth at bedtime.    Dispense:  30 tablet    Refill:  1    Supervising Provider:   ARFEEN, SYED T [[id number]]   venlafaxine  XR (EFFEXOR  XR) 75 MG 24 hr capsule    Sig: Take 1 capsule (75 mg total) by mouth daily with breakfast.    Dispense:  30 capsule    Refill:  1    Supervising Provider:   ARFEEN, SYED T [[id number]]   QUEtiapine  (SEROQUEL ) 25 MG tablet    Sig: Take 1 tablet (25 mg total) by mouth 2 (two) times daily as needed (anxiety).    Dispense:  60 tablet    Refill:  1    Supervising Provider:   CURRY PATERSON T [[id number]]    Labs:  Reviewed. Not indicated at this time  Other:  Continue counseling/therapy with Winton Rubinstein, LCSW. Reniah Cottingham is instructed to call [phone], [phone], mobile crisis, or present to the nearest emergency room should she experience any suicidal/homicidal ideation, auditory/visual/hallucinations, or detrimental worsening of her mental health condition.   Taiesha Schooler has participated in the development of this treatment plan and verbalized her agreement with plan as listed.  Follow Up: Return in 1 month for medication management Call in the interim for any side-effects, decompensation, questions, or problems  Collaboration of Care: Collaboration of  Care: Medication Management AEB medication assessment, adjustment, refills.    Patient/Guardian was advised Release of Information must be obtained prior to any record release in order to collaborate their care with an outside provider. Patient/Guardian was advised if they have not already done so to contact the registration department to sign all necessary forms in order for us  to release information regarding their care.   Consent: Patient/Guardian gives verbal consent for treatment and assignment of benefits for services provided during this visit. Patient/Guardian expressed understanding and agreed to proceed.    Krisalyn Yankowski, NP 02/27/2024, 11:20 AM

## 2024-02-27 NOTE — Patient Instructions (Signed)

## 2024-02-27 NOTE — Telephone Encounter (Signed)
 Called patient to schedule 1 month f/u with Sh[facility]on due to provider's request. Staff was not able to reach patient. Lmom and office number was provided.

## 2024-03-09 ENCOUNTER — Ambulatory Visit (HOSPITAL_COMMUNITY): Admitting: Psychiatry

## 2024-03-09 ENCOUNTER — Telehealth (HOSPITAL_COMMUNITY): Payer: Self-pay | Admitting: Psychiatry

## 2024-03-09 NOTE — Telephone Encounter (Signed)
 Therapist attempted to contact patient via text through caregility platform for scheduled appointment, no response.  Therapist called patient, left message indicating attempt, and requesting patient call office.

## 2024-03-20 ENCOUNTER — Telehealth: Admitting: Family Medicine

## 2024-03-20 DIAGNOSIS — L03119 Cellulitis of unspecified part of limb: Secondary | ICD-10-CM

## 2024-03-21 MED ORDER — SULFAMETHOXAZOLE-TRIMETHOPRIM 800-160 MG PO TABS: 1.0000 | ORAL_TABLET | Freq: Two times a day (BID) | ORAL | 0 refills | Status: AC

## 2024-03-21 NOTE — Progress Notes (Signed)
 E-Visit for Cellulitis  We are sorry that you are not feeling well. Here is how we plan to help!  Based on what you shared with me it looks like you have cellulitis.  Cellulitis looks like areas of skin redness, swelling, and warmth; it develops as a result of bacteria entering under the skin. Little red spots and/or bleeding can be seen in skin, and tiny surface sacs containing fluid can occur. Fever can be present. Cellulitis is almost always on one side of a body, and the lower limbs are the most common site of involvement.   I have prescribed:  Bactrim DS 1 tablet by mouth twice a day for 7 days  HOME CARE:  Take your medications as ordered and take all of them, even if the skin irritation appears to be healing.   GET HELP RIGHT AWAY IF:  Symptoms that don't begin to go away within 48 hours. Severe redness persists or worsens If the area turns color, spreads or swells. If it blisters and opens, develops yellow-brown crust or bleeds. You develop a fever or chills. If the pain increases or becomes unbearable.  Are unable to keep fluids and food down.  MAKE SURE YOU   Understand these instructions. Will watch your condition. Will get help right away if you are not doing well or get worse.  Thank you for choosing an e-visit.  Your e-visit answers were reviewed by a board certified advanced clinical practitioner to complete your personal care plan. Depending upon the condition, your plan could have included both over the counter or prescription medications.  Please review your pharmacy choice. Make sure the pharmacy is open so you can pick up prescription now. If there is a problem, you may contact your provider through [employer] and have the prescription routed to another pharmacy.  Your safety is important to us . If you have drug allergies check your prescription carefully.   For the next 24 hours you can use MyChart to ask questions about today's visit, request a  non-urgent call back, or ask for a work or school excuse. You will get an email in the next two days asking about your experience. I hope that your e-visit has been valuable and will speed your recovery.  I have spent 5 minutes in review of e-visit questionnaire, review and updating patient chart, medical decision making and response to patient.   Roosvelt Mater, PA-C

## 2024-03-23 ENCOUNTER — Telehealth (HOSPITAL_COMMUNITY): Payer: Self-pay | Admitting: Psychiatry

## 2024-03-23 ENCOUNTER — Ambulatory Visit (HOSPITAL_COMMUNITY): Admitting: Psychiatry

## 2024-03-23 NOTE — Telephone Encounter (Signed)
 Therapist attempted to contact patient via text through caregility platform regarding scheduled appointment, no response.  Therapist called patient, left message indicating attempt, and requesting patient call office.

## 2024-03-26 ENCOUNTER — Other Ambulatory Visit: Payer: Self-pay

## 2024-03-26 ENCOUNTER — Emergency Department (HOSPITAL_COMMUNITY)
Admission: EM | Admit: 2024-03-26 | Discharge: 2024-03-26 | Disposition: A | Discharge status: Home / Self Care | Attending: Emergency Medicine | Admitting: Emergency Medicine

## 2024-03-26 ENCOUNTER — Encounter (HOSPITAL_COMMUNITY): Payer: Self-pay | Admitting: Emergency Medicine

## 2024-03-26 DIAGNOSIS — F419 Anxiety disorder, unspecified: Secondary | ICD-10-CM | POA: Insufficient documentation

## 2024-03-26 DIAGNOSIS — R112 Nausea with vomiting, unspecified: Secondary | ICD-10-CM | POA: Insufficient documentation

## 2024-03-26 DIAGNOSIS — F19130 Other psychoactive substance abuse with withdrawal, uncomplicated: Secondary | ICD-10-CM | POA: Diagnosis not present

## 2024-03-26 DIAGNOSIS — Z5329 Procedure and treatment not carried out because of patient's decision for other reasons: Secondary | ICD-10-CM | POA: Insufficient documentation

## 2024-03-26 DIAGNOSIS — F19939 Other psychoactive substance use, unspecified with withdrawal, unspecified: Secondary | ICD-10-CM

## 2024-03-26 LAB — CBC
HCT: 41.3 % (ref 36.0–46.0)
Hemoglobin: 12.5 g/dL (ref 12.0–15.0)
MCH: 23.9 pg — ABNORMAL LOW (ref 26.0–34.0)
MCHC: 30.3 g/dL (ref 30.0–36.0)
MCV: 79 fL — ABNORMAL LOW (ref 80.0–100.0)
Platelets: 276 K/uL (ref 150–400)
RBC: 5.23 MIL/uL — ABNORMAL HIGH (ref 3.87–5.11)
RDW: 15.7 % — ABNORMAL HIGH (ref 11.5–15.5)
WBC: 9.5 K/uL (ref 4.0–10.5)
nRBC: 0 % (ref 0.0–0.2)

## 2024-03-26 LAB — URINALYSIS, ROUTINE W REFLEX MICROSCOPIC
Bilirubin Urine: NEGATIVE
Glucose, UA: NEGATIVE mg/dL
Hgb urine dipstick: NEGATIVE
Ketones, ur: NEGATIVE mg/dL
Nitrite: NEGATIVE
Protein, ur: 30 mg/dL — AB
Specific Gravity, Urine: 1.028 (ref 1.005–1.030)
pH: 5 (ref 5.0–8.0)

## 2024-03-26 LAB — COMPREHENSIVE METABOLIC PANEL WITH GFR
ALT: 13 U/L (ref 0–44)
AST: 13 U/L — ABNORMAL LOW (ref 15–41)
Albumin: 4.1 g/dL (ref 3.5–5.0)
Alkaline Phosphatase: 127 U/L — ABNORMAL HIGH (ref 38–126)
Anion gap: 12 (ref 5–15)
BUN: 8 mg/dL (ref 6–20)
CO2: 25 mmol/L (ref 22–32)
Calcium: 9.3 mg/dL (ref 8.9–10.3)
Chloride: 102 mmol/L (ref 98–111)
Creatinine, Ser: 0.72 mg/dL (ref 0.44–1.00)
GFR, Estimated: >60 mL/min (ref >60)
Glucose, Bld: 96 mg/dL (ref 70–99)
Potassium: 3.9 mmol/L (ref 3.5–5.1)
Sodium: 139 mmol/L (ref 135–145)
Total Bilirubin: 0.3 mg/dL (ref 0.0–1.2)
Total Protein: 8 g/dL (ref 6.5–8.1)

## 2024-03-26 LAB — LIPASE, BLOOD: Lipase: 23 U/L (ref 11–51)

## 2024-03-26 LAB — PREGNANCY, URINE: Preg Test, Ur: NEGATIVE

## 2024-03-26 MED ORDER — ACETAMINOPHEN 325 MG PO TABS
650.0000 mg | ORAL_TABLET | Freq: Once | ORAL | Status: AC
  Administered 2024-03-26: 650 mg via ORAL
  Filled 2024-03-26: qty 2

## 2024-03-26 NOTE — ED Notes (Signed)
 Pt ambulated to the bathroom in NAD to try to provide urine sample.

## 2024-03-26 NOTE — ED Notes (Signed)
 Pt stated to this nurse, I need to leave. I need to go get my child. This nurse infomred EDP.

## 2024-03-26 NOTE — ED Notes (Addendum)
 Pt left with husband. AMA signature not obtained.

## 2024-03-26 NOTE — ED Triage Notes (Addendum)
 Pt stopped effexor , seroquel , luvox , abilify  cold [place] 10 days ago. Pt did take one of each of these four about 2 hours ago. Pt denies wanting to hurt/kill self. Pt denies avh. Pt states stopped taking them due to wanting to get pregnant. Pt c/o n/v, crying a lot, insomnia, body aches, feeling like she is having panic attacks. Denies si/hi. Pt tearful in triage. States she realizes this should have been done under dr supervision. N/v x 2 in last 2 hours. Has been able to keep water down

## 2024-03-26 NOTE — ED Provider Notes (Signed)
 [place] EMERGENCY DEPARTMENT AT [facility] Provider Note   CSN: [id number] Arrival date & time: 03/26/24  1725     Patient presents with: No chief complaint on file.   Latoya Mora is a 28 y.o. female who presents to the emergency department after stopping multiple psychiatric medications approximately 10 days ago.  Patient states that she normally takes Effexor , Seroquel , Luvox , as well as Abilify .  Approximately 2 days ago she decided to stop all of these medications due to her wanting to become pregnant.  Patient states that ever since stopping this medication she feels like she has been in withdrawal.  Patient states she has experienced episodes of nausea and vomiting, crying, insomnia, body aches, and feeling like she is going to have a panic attack as well as increased anxiety.  Patient denies suicidal or homicidal ideations, denies auditory or visual hallucinations.  Patient appreciates vomiting twice today prior to coming the emergency department.  Patient states that she has been able to eat solid foods and tolerate liquids.  Past medical history significant for anxiety and depression, obesity, PCOS, back pain, cauda equina compression, IDA, etc. patient states that she mainly stopped the medications because of the pregnancy but also because she ran out of quite a few of the medications.  Patient states that she did take a dose of Abilify  as well as Effexor  today.  States that she supposed to follow-up with her psychiatrist on Monday.  She states that her symptoms of anxiety depression were pretty well-controlled on her current medication regimen. Denies hematemesis.    HPI     Prior to Admission medications   Medication Sig Start Date End Date Taking? Authorizing Provider  acetaminophen  (TYLENOL ) 500 MG tablet Take 1,000 mg by mouth every 6 (six) hours as needed (general pain).    [provider]  baclofen  (LIORESAL ) 10 MG tablet Take 1 tablet (10 mg total) by  mouth 3 (three) times daily. 09/09/23   Gladis Elsie BROCKS, PA-C  diphenhydrAMINE -APAP, sleep, (EXCEDRIN PM PO) Take 2 tablets by mouth at bedtime as needed (general pain).    [provider]  fluvoxaMINE  (LUVOX ) 50 MG tablet Take 1 tablet (50 mg total) by mouth at bedtime. 02/27/24   Rankin, Shuvon B, NP  phentermine  (ADIPEX-P ) 37.5 MG tablet Take 1 tablet (37.5 mg total) by mouth daily before breakfast. 01/27/24   Grooms, Charmaine, PA-C  QUEtiapine  (SEROQUEL  XR) 50 MG TB24 24 hr tablet Take 2 tablets (100 mg total) by mouth at bedtime. 02/27/24   Rankin, Shuvon B, NP  QUEtiapine  (SEROQUEL ) 25 MG tablet Take 1 tablet (25 mg total) by mouth 2 (two) times daily as needed (anxiety). 02/27/24   Rankin, Shuvon B, NP  sulfamethoxazole-trimethoprim (BACTRIM DS) 800-160 MG tablet Take 1 tablet by mouth 2 (two) times daily for 7 days. 03/21/24 03/28/24  Kingston Robes, PA-C  venlafaxine  XR (EFFEXOR  XR) 75 MG 24 hr capsule Take 1 capsule (75 mg total) by mouth daily with breakfast. 02/27/24   Rankin, Shuvon B, NP    Allergies: Etodolac, Meloxicam, Flexeril [cyclobenzaprine], Nsaids, Prozac  [fluoxetine  hcl], Hydroxyzine , and Tizanidine     Review of Systems  Psychiatric/Behavioral:  The patient is nervous/anxious.     Updated Vital Signs BP (!) 141/83   Pulse 88   Temp 98.3 F (36.8 C) (Oral)   Resp 16   Ht 5' 8 (1.727 m)   Wt (!) 152 kg   LMP 03/21/2024   SpO2 97%   BMI 50.94 kg/m  Physical Exam Vitals and nursing note reviewed.  Constitutional:      General: She is awake. She is not in acute distress.    Appearance: Normal appearance. She is not ill-appearing, toxic-appearing or diaphoretic.  HENT:     Head: Normocephalic and atraumatic.  Eyes:     General: No scleral icterus. Cardiovascular:     Rate and Rhythm: Normal rate and regular rhythm.  Pulmonary:     Effort: Pulmonary effort is normal. No respiratory distress.     Breath sounds: No wheezing, rhonchi or rales.  Musculoskeletal:         General: Normal range of motion.     Right lower leg: No edema.     Left lower leg: No edema.  Skin:    General: Skin is warm.     Capillary Refill: Capillary refill takes less than 2 seconds.  Neurological:     General: No focal deficit present.     Mental Status: She is alert and oriented to person, place, and time.  Psychiatric:        Mood and Affect: Mood normal.        Behavior: Behavior normal. Behavior is cooperative.     (all labs ordered are listed, but only abnormal results are displayed) Labs Reviewed  COMPREHENSIVE METABOLIC PANEL WITH GFR - Abnormal; Notable for the following components:      Result Value   AST 13 (*)    Alkaline Phosphatase 127 (*)    All other components within normal limits  CBC - Abnormal; Notable for the following components:   RBC 5.23 (*)    MCV 79.0 (*)    MCH 23.9 (*)    RDW 15.7 (*)    All other components within normal limits  URINALYSIS, ROUTINE W REFLEX MICROSCOPIC - Abnormal; Notable for the following components:   Color, Urine AMBER (*)    APPearance HAZY (*)    Protein, ur 30 (*)    Leukocytes,Ua MODERATE (*)    Bacteria, UA RARE (*)    All other components within normal limits  LIPASE, BLOOD  PREGNANCY, URINE    EKG: None  Radiology: No results found.   Procedures   Medications Ordered in the ED  acetaminophen  (TYLENOL ) tablet 650 mg (650 mg Oral Given 03/26/24 2220)                                    Medical Decision Making Amount and/or Complexity of Data Reviewed Labs: ordered.  Risk OTC drugs.   Patient presents to the ED for concern of stopping multiple psychiatric medications, nausea, vomiting, crying, insomnia, body aches, panic attacks this involves an extensive number of treatment options, and is a complaint that carries with it a high risk of complications and morbidity.  The differential diagnosis includes psychiatric diagnosis, withdrawals from previous medications, panic disorder,  depression, acute stress disorder, etc.   Co morbidities that complicate the patient evaluation  anxiety depression, obesity, PCOS, back pain, cauda equina compression, IDA   Lab Tests:  I Ordered, and personally interpreted labs.  The pertinent results include: CBC and CMP reassuring, urinalysis significant for rare bacteria and moderate leuks however 11-20 squamous epithelial cells present, patient denies urinary symptoms at this time, pregnancy negative   Cardiac Monitoring:  EKG ordered however was not completed prior to patient leaving AMA   Medicines ordered and prescription drug management:  I ordered medication  including Tylenol  for headache Reevaluation of the patient after these medicines showed that the patient improved I have reviewed the patients home medicines and have made adjustments as needed   Test Considered:  None   Critical Interventions:  None   Consultations Obtained:  I requested consultation with the psychiatric team,  and discussed lab and imaging findings as well as pertinent plan - they recommend: Consult TTS for medication management and ongoing diagnosis and treatment   Problem List / ED Course:  28 year old female, vital signs stable, stopped multiple psychiatric medications approximately 10 days ago, ever since has been having increased anxiety, feelings of panic, nausea, vomiting as well as other symptoms On physical exam patient extremely well-appearing, physical exam reassuring, patient does not appear extremely anxious, patient mood is normal, denies SI, HI, AVH Lab workup reassuring as well, will obtain EKG Spoke with NP Chinwendu with behavioral health, she recommended TTS consult for medication management Consult to TTS placed Prior to TTS assessment patient left AMA, at time of patient leaving AMA vital signs are stable, patient asymptomatic, reassuring lab work however TTS consult has not been completed with medication  recommendations so I do not feel comfortable discharging this patient Suspect symptoms are due to possible withdrawal related to abruptly stopping psychiatric medications, patient seems to have improved after taking Abilify  as well as Effexor  earlier today  Reevaluation:  Patient left AMA prior to TTS consult being completed   Social Determinants of Health:  None   Dispostion:  Patient left AMA prior to TTS consult, patient understands risks of leaving AMA including worsening of symptoms or even death     Final diagnoses:  Anxiety  Nausea and vomiting, unspecified vomiting type  Withdrawal from other psychoactive substance [facility])    ED Discharge Orders     None          Janetta Terrall FALCON, PA-C 03/27/24 0145    Elnor Jayson LABOR, DO 04/02/24 2353

## 2024-03-26 NOTE — ED Notes (Addendum)
EDP at bedside speaking with pt. 

## 2024-03-30 ENCOUNTER — Telehealth (HOSPITAL_COMMUNITY): Admitting: Registered Nurse

## 2024-04-02 ENCOUNTER — Telehealth (HOSPITAL_COMMUNITY): Payer: Self-pay | Admitting: *Deleted

## 2024-04-02 NOTE — Telephone Encounter (Signed)
 Called patient to resch her appt for October 27th and Nov 10th due to provider not being available to do appts for those days. Staff not able to reach patient and lmom for patient to call office back to resch appts.

## 2024-04-06 ENCOUNTER — Ambulatory Visit (HOSPITAL_COMMUNITY): Admitting: Psychiatry

## 2024-04-06 ENCOUNTER — Telehealth (HOSPITAL_COMMUNITY): Payer: Self-pay | Admitting: Psychiatry

## 2024-04-06 ENCOUNTER — Encounter (HOSPITAL_COMMUNITY): Payer: Self-pay | Admitting: Psychiatry

## 2024-04-06 NOTE — Telephone Encounter (Signed)
 Therapist attempted to contact patient via text through caregility platform for scheduled appointment, no response.  Therapist called patient, left message indicating attempt, and requesting patient call office.

## 2024-04-14 ENCOUNTER — Other Ambulatory Visit: Payer: Self-pay | Admitting: Surgery

## 2024-04-20 ENCOUNTER — Ambulatory Visit (HOSPITAL_COMMUNITY): Admitting: Psychiatry

## 2024-04-26 ENCOUNTER — Inpatient Hospital Stay (HOSPITAL_COMMUNITY)

## 2024-04-26 ENCOUNTER — Inpatient Hospital Stay (HOSPITAL_COMMUNITY)
Admission: AD | Admit: 2024-04-26 | Discharge: 2024-04-26 | Disposition: A | Discharge status: Home / Self Care | Attending: Obstetrics and Gynecology | Admitting: Obstetrics and Gynecology

## 2024-04-26 ENCOUNTER — Encounter: Payer: Self-pay | Admitting: Family Medicine

## 2024-04-26 DIAGNOSIS — E282 Polycystic ovarian syndrome: Secondary | ICD-10-CM | POA: Insufficient documentation

## 2024-04-26 DIAGNOSIS — O26891 Other specified pregnancy related conditions, first trimester: Secondary | ICD-10-CM | POA: Diagnosis present

## 2024-04-26 DIAGNOSIS — R109 Unspecified abdominal pain: Secondary | ICD-10-CM

## 2024-04-26 DIAGNOSIS — Z3A01 Less than 8 weeks gestation of pregnancy: Secondary | ICD-10-CM

## 2024-04-26 DIAGNOSIS — R10A1 Flank pain, right side: Secondary | ICD-10-CM | POA: Diagnosis not present

## 2024-04-26 DIAGNOSIS — O99281 Endocrine, nutritional and metabolic diseases complicating pregnancy, first trimester: Secondary | ICD-10-CM | POA: Diagnosis not present

## 2024-04-26 LAB — WET PREP, GENITAL
Clue Cells Wet Prep HPF POC: NONE SEEN
Sperm: NONE SEEN
Trich, Wet Prep: NONE SEEN
WBC, Wet Prep HPF POC: <10 (ref <10)
Yeast Wet Prep HPF POC: NONE SEEN

## 2024-04-26 LAB — URINALYSIS, ROUTINE W REFLEX MICROSCOPIC
Bilirubin Urine: NEGATIVE
Glucose, UA: NEGATIVE mg/dL
Hgb urine dipstick: NEGATIVE
Ketones, ur: NEGATIVE mg/dL
Nitrite: NEGATIVE
Protein, ur: NEGATIVE mg/dL
Specific Gravity, Urine: 1.021 (ref 1.005–1.030)
pH: 5 (ref 5.0–8.0)

## 2024-04-26 LAB — HCG, QUANTITATIVE, PREGNANCY: hCG, Beta Chain, Quant, S: 3640 m[IU]/mL — ABNORMAL HIGH (ref <5)

## 2024-04-26 LAB — CBC
HCT: 38.7 % (ref 36.0–46.0)
Hemoglobin: 11.7 g/dL — ABNORMAL LOW (ref 12.0–15.0)
MCH: 23.5 pg — ABNORMAL LOW (ref 26.0–34.0)
MCHC: 30.2 g/dL (ref 30.0–36.0)
MCV: 77.9 fL — ABNORMAL LOW (ref 80.0–100.0)
Platelets: 274 K/uL (ref 150–400)
RBC: 4.97 MIL/uL (ref 3.87–5.11)
RDW: 16.5 % — ABNORMAL HIGH (ref 11.5–15.5)
WBC: 11.8 K/uL — ABNORMAL HIGH (ref 4.0–10.5)
nRBC: 0 % (ref 0.0–0.2)

## 2024-04-26 LAB — POCT PREGNANCY, URINE: Preg Test, Ur: POSITIVE — AB

## 2024-04-26 NOTE — MAU Note (Signed)
 Took a at home pregnancy test a week ago. Positive. Since them she has noticed right side sharp stabbing pain that comes and goes. Rates pain 3/10.  Pain started Thursday afternoon. Pt has not taken anything for the pain.   Pt denies any N/V. No bleeding.   Hx of PCOS pain feels similar to a cyst.     Vitals:   04/26/24 2059 04/26/24 2100  BP:  139/80  Pulse:  (!) 108  Resp: 17   Temp: 98.8 F (37.1 C)

## 2024-04-26 NOTE — MAU Provider Note (Signed)
 Chief Complaint:  Abdominal Pain (Lower right side abdominal pain. )   HPI   None     Latoya Mora is a 28 y.o. [id number] at [redacted]w[redacted]d who presents to maternity admissions reporting right abdominal pain.  She reports that for the past week, she has had intermittent right sided abdominal pain that she describes as sharp and stabbing, rated at 3/10 intensity.  She has not taken anything for the pain.  Denies vaginal bleeding, nausea, vomiting, fever, chills.  She does have a history of PCOS and notes that the pain feels similar to prior cysts.  Pregnancy Course: Receives care at [facility] for [facility] . OB intake scheduled for 05/12/2024.  Past Medical History:  Diagnosis Date   Allergy    Anemia    during pregnancies   Anxiety    Anxiety and depression 09/18/2016   effexor  150mg >11/04/17 wants to wean off  Significant anxiety and depression, postpartum--consider meds at pregnancy end or immed. Pp, safe with breast feeding only     Depression    Elevated liver enzymes    with both pregnancies, no problems after pregnancies   Frequent headaches    no longer having frequent headaches   History of pre-eclampsia    Morbid obesity (HCC) 04/03/2017   PCOS (polycystic ovarian syndrome)    OB History  Gravida Para Term Preterm AB Living  3 2 2  0 0 2  SAB IAB Ectopic Multiple Live Births  0 0 0 0 2    # Outcome Date GA Lbr Len/2nd Weight Sex Type Anes PTL Lv  3 Current           2 Term 08/04/19 [redacted]w[redacted]d 03:40 / 00:03 3115 g M Vag-Spont EPI  LIV  1 Term 05/28/18 [redacted]w[redacted]d 02:00 / 00:13 3368 g M Vag-Spont EPI  LIV   Past Surgical History:  Procedure Laterality Date   LAPAROSCOPIC SALPINGO OOPHERECTOMY Left 02/23/2020   Procedure: LAPAROSCOPIC SALPINGO OOPHORECTOMY;  Surgeon: Herchel Gloris LABOR, MD;  Location: [facility] OR;  Service: Gynecology;  Laterality: Left;   LUMBAR LAMINECTOMY/DECOMPRESSION MICRODISCECTOMY N/A 04/19/2023   Procedure: LUMBAR LAMINECTOMY/DECOMPRESSION MICRODISCECTOMY   Lumbar Four - Five;  Surgeon: Dawley, Lani BROCKS, DO;  Location: [facility] OR;  Service: Neurosurgery;  Laterality: N/A;   TOOTH EXTRACTION     local anesthestic only   Family History  Problem Relation Age of Onset   Depression Mother    Anxiety disorder Mother    Heart disease Mother    Skin cancer Mother    Scoliosis Mother    Alcohol abuse Father    Bipolar disorder Father    Depression Father    Osteochondroma Father    Osteochondroma Sister    Osteochondroma Brother    Anxiety disorder Maternal Grandfather    COPD Maternal Grandfather    Skin cancer Maternal Grandfather    Parkinson's disease Maternal Grandfather    Anxiety disorder Maternal Grandmother    COPD Maternal Grandmother    Scoliosis Maternal Grandmother    Bipolar disorder Paternal Grandfather    Arthritis Paternal Grandmother    Diabetes Paternal Grandmother    Cancer Neg Hx    Breast cancer Neg Hx    Social History   Tobacco Use   Smoking status: Never   Smokeless tobacco: Never  Vaping Use   Vaping status: Never Used  Substance Use Topics   Alcohol use: Not Currently   Drug use: No   Allergies  Allergen Reactions   Etodolac Nausea Only  Meloxicam Other (See Comments)    Abdominal pain   Flexeril [Cyclobenzaprine] Nausea Only   Nsaids Other (See Comments)    Stomach pain. Pt ok with Motrin    Prozac  [Fluoxetine  Hcl] Other (See Comments)    Felt bad/awful   Hydroxyzine  Nausea Only   Tizanidine  Palpitations   Medications Prior to Admission  Medication Sig Dispense Refill Last Dose/Taking   acetaminophen  (TYLENOL ) 500 MG tablet Take 1,000 mg by mouth every 6 (six) hours as needed (general pain).      baclofen  (LIORESAL ) 10 MG tablet Take 1 tablet (10 mg total) by mouth 3 (three) times daily. 30 each 0    diphenhydrAMINE -APAP, sleep, (EXCEDRIN PM PO) Take 2 tablets by mouth at bedtime as needed (general pain).      fluvoxaMINE  (LUVOX ) 50 MG tablet Take 1 tablet (50 mg total) by mouth at bedtime. 30  tablet 1    phentermine  (ADIPEX-P ) 37.5 MG tablet Take 1 tablet (37.5 mg total) by mouth daily before breakfast. 30 tablet 0    QUEtiapine  (SEROQUEL  XR) 50 MG TB24 24 hr tablet Take 2 tablets (100 mg total) by mouth at bedtime. 60 tablet 1    QUEtiapine  (SEROQUEL ) 25 MG tablet Take 1 tablet (25 mg total) by mouth 2 (two) times daily as needed (anxiety). 60 tablet 1    venlafaxine  XR (EFFEXOR  XR) 75 MG 24 hr capsule Take 1 capsule (75 mg total) by mouth daily with breakfast. 30 capsule 1     I have reviewed patient's Past Medical Hx, Surgical Hx, Family Hx, Social Hx, medications and allergies.   ROS  Pertinent items noted in HPI and remainder of comprehensive ROS otherwise negative.   PHYSICAL EXAM  Patient Vitals for the past 24 hrs:  BP Temp Temp src Pulse Resp Height Weight  04/26/24 2100 139/80 -- -- (!) 108 -- 5' 8 (1.727 m) (!) 153.2 kg  04/26/24 2059 -- 98.8 F (37.1 C) Oral -- 17 -- --    Constitutional: Well-developed, well-nourished female in no acute distress.  HEENT: atraumatic, normocephalic. Neck has normal ROM. EOM intact. Cardiovascular: normal rate & rhythm, warm and well-perfused Respiratory: normal effort, no problems with respiration noted GI: Abd soft, non-tender, non-distended MSK: Extremities nontender, no edema, normal ROM Skin: warm and dry. Acyanotic, no jaundice or pallor. Neurologic: Alert and oriented x 4. No abnormal coordination. Psychiatric: Normal mood. Speech not slurred, not rapid/pressured. Patient is cooperative.  Labs: Results for orders placed or performed during the hospital encounter of 04/26/24 (from the past 24 hours)  Urinalysis, Routine w reflex microscopic -Urine, Clean Catch     Status: Abnormal   Collection Time: 04/26/24  8:49 PM  Result Value Ref Range   Color, Urine YELLOW YELLOW   APPearance CLEAR CLEAR   Specific Gravity, Urine 1.021 1.005 - 1.030   pH 5.0 5.0 - 8.0   Glucose, UA NEGATIVE NEGATIVE mg/dL   Hgb urine dipstick  NEGATIVE NEGATIVE   Bilirubin Urine NEGATIVE NEGATIVE   Ketones, ur NEGATIVE NEGATIVE mg/dL   Protein, ur NEGATIVE NEGATIVE mg/dL   Nitrite NEGATIVE NEGATIVE   Leukocytes,Ua SMALL (A) NEGATIVE   RBC / HPF 0-5 0 - 5 RBC/hpf   WBC, UA 0-5 0 - 5 WBC/hpf   Bacteria, UA RARE (A) NONE SEEN   Squamous Epithelial / HPF 0-5 0 - 5 /HPF   Mucus PRESENT   Pregnancy, urine POC     Status: Abnormal   Collection Time: 04/26/24  8:50 PM  Result Value  Ref Range   Preg Test, Ur POSITIVE (A) NEGATIVE  CBC     Status: Abnormal   Collection Time: 04/26/24  9:23 PM  Result Value Ref Range   WBC 11.8 (H) 4.0 - 10.5 K/uL   RBC 4.97 3.87 - 5.11 MIL/uL   Hemoglobin 11.7 (L) 12.0 - 15.0 g/dL   HCT 61.2 63.9 - 53.9 %   MCV 77.9 (L) 80.0 - 100.0 fL   MCH 23.5 (L) 26.0 - 34.0 pg   MCHC 30.2 30.0 - 36.0 g/dL   RDW 83.4 (H) 88.4 - 84.4 %   Platelets 274 150 - 400 K/uL   nRBC 0.0 0.0 - 0.2 %  hCG, quantitative, pregnancy     Status: Abnormal   Collection Time: 04/26/24  9:23 PM  Result Value Ref Range   hCG, Beta Chain, Quant, S 3,640 (H) <5 mIU/mL  Wet prep, genital     Status: None   Collection Time: 04/26/24  9:26 PM  Result Value Ref Range   Yeast Wet Prep HPF POC NONE SEEN NONE SEEN   Trich, Wet Prep NONE SEEN NONE SEEN   Clue Cells Wet Prep HPF POC NONE SEEN NONE SEEN   WBC, Wet Prep HPF POC <10 <10   Sperm NONE SEEN     Imaging:  US  OB LESS THAN 14 WEEKS WITH OB TRANSVAGINAL Result Date: 04/26/2024 CLINICAL DATA:  Right lower quadrant pain. EXAM: OBSTETRIC <14 WK US  AND TRANSVAGINAL OB US  TECHNIQUE: Both transabdominal and transvaginal ultrasound examinations were performed for complete evaluation of the gestation as well as the maternal uterus, adnexal regions, and pelvic cul-de-sac. Transvaginal technique was performed to assess early pregnancy. COMPARISON:  December 12, 2022 FINDINGS: Intrauterine gestational sac: Single Yolk sac:  Not Visualized. Embryo:  Not Visualized. Cardiac Activity: Not  Visualized. Heart Rate: N/A  bpm MSD: 6.5 mm   5 w   2 d Subchorionic hemorrhage:  None visualized. Maternal uterus/adnexae: The right ovary is visualized and is normal in appearance. The left ovary is surgically absent. No pelvic free fluid is seen. Other: The study is technically difficult secondary to the patient's body habitus, as per the ultrasound technologist. IMPRESSION: Probable early intrauterine gestational sac, but no yolk sac, fetal pole, or cardiac activity yet visualized. Recommend follow-up quantitative B-HCG levels and follow-up US  in 14 days to assess viability. This recommendation follows SRU consensus guidelines: Diagnostic Criteria for Nonviable Pregnancy Early in the First Trimester. LOISE Diedra PARAS Med 2013; 630:8556-48. Electronically Signed   By: Suzen Dials M.D.   On: 04/26/2024 22:15   MDM & MAU COURSE  MDM: Moderate  MAU Course: -Vital signs within normal limits. -Ectopic rule out: CBC, US , beta-hCG. Blood type already on file. -Wet prep, UA, and GC/chlamydia to rule out infectious causes.  -UA and wet prep negative. -CBC within normal limits.  -US  with IUGS but no YS or FP. Repeat US  in 10 days.  Differential diagnosis considered for 1st trimester vaginal bleeding includes but is not limited to: ectopic pregnancy, complete spontaneous abortion, incomplete abortion, missed abortion, threatened abortion, embryonic/fetal demise, cervical insufficiency, cervical or vaginal disorder   Orders Placed This Encounter  Procedures   Wet prep, genital   US  OB LESS THAN 14 WEEKS WITH OB TRANSVAGINAL   Urinalysis, Routine w reflex microscopic -Urine, Clean Catch   CBC   hCG, quantitative, pregnancy   Diet NPO time specified   Pregnancy, urine POC   Discharge patient   No orders of the defined  types were placed in this encounter.   ASSESSMENT   1. Right lateral abdominal pain   2. [redacted] weeks gestation of pregnancy     PLAN  Discharge home in stable condition with ectopic  precautions.  Repeat US  in 10 days at [facility] office. Message sent to staff to schedule. Tylenol  prn for pain.    Follow-up Information     [facility] Follow up in 3 day(s).   Specialty: Obstetrics and Gynecology Why: repeat blood work Contact information: [address] [place]  [address] [phone]                 Allergies as of 04/26/2024       Reactions   Etodolac Nausea Only   Meloxicam Other (See Comments)   Abdominal pain   Flexeril [cyclobenzaprine] Nausea Only   Nsaids Other (See Comments)   Stomach pain. Pt ok with Motrin    Prozac  [fluoxetine  Hcl] Other (See Comments)   Felt bad/awful   Hydroxyzine  Nausea Only   Tizanidine  Palpitations        Medication List     STOP taking these medications    baclofen  10 MG tablet Commonly known as: LIORESAL    phentermine  37.5 MG tablet Commonly known as: ADIPEX-P        TAKE these medications    acetaminophen  500 MG tablet Commonly known as: TYLENOL  Take 1,000 mg by mouth every 6 (six) hours as needed (general pain).   EXCEDRIN PM PO Take 2 tablets by mouth at bedtime as needed (general pain).   fluvoxaMINE  50 MG tablet Commonly known as: LUVOX  Take 1 tablet (50 mg total) by mouth at bedtime.   QUEtiapine  50 MG Tb24 24 hr tablet Commonly known as: SEROquel  XR Take 2 tablets (100 mg total) by mouth at bedtime.   QUEtiapine  25 MG tablet Commonly known as: SEROquel  Take 1 tablet (25 mg total) by mouth 2 (two) times daily as needed (anxiety).   venlafaxine  XR 75 MG 24 hr capsule Commonly known as: Effexor  XR Take 1 capsule (75 mg total) by mouth daily with breakfast.        Joesph DELENA Sear, PA

## 2024-04-26 NOTE — Discharge Instructions (Addendum)
 We will plan to repeat your ultrasound in about 10 days in the office!

## 2024-04-27 LAB — GC/CHLAMYDIA PROBE AMP (~~LOC~~) NOT AT ARMC
Chlamydia: NEGATIVE
Comment: NEGATIVE
Comment: NORMAL
Neisseria Gonorrhea: NEGATIVE

## 2024-04-28 ENCOUNTER — Ambulatory Visit: Admitting: Physician Assistant

## 2024-04-29 ENCOUNTER — Other Ambulatory Visit

## 2024-05-04 ENCOUNTER — Ambulatory Visit (HOSPITAL_COMMUNITY): Admitting: Psychiatry

## 2024-05-12 ENCOUNTER — Ambulatory Visit (INDEPENDENT_AMBULATORY_CARE_PROVIDER_SITE_OTHER): Admitting: *Deleted

## 2024-05-12 ENCOUNTER — Other Ambulatory Visit (INDEPENDENT_AMBULATORY_CARE_PROVIDER_SITE_OTHER): Payer: Self-pay

## 2024-05-12 VITALS — BP 137/80 | HR 83 | Wt 343.0 lb

## 2024-05-12 DIAGNOSIS — Z3A01 Less than 8 weeks gestation of pregnancy: Secondary | ICD-10-CM

## 2024-05-12 DIAGNOSIS — O0991 Supervision of high risk pregnancy, unspecified, first trimester: Secondary | ICD-10-CM | POA: Diagnosis not present

## 2024-05-12 DIAGNOSIS — Z6841 Body Mass Index (BMI) 40.0 and over, adult: Secondary | ICD-10-CM

## 2024-05-12 DIAGNOSIS — O0990 Supervision of high risk pregnancy, unspecified, unspecified trimester: Secondary | ICD-10-CM

## 2024-05-12 DIAGNOSIS — O10919 Unspecified pre-existing hypertension complicating pregnancy, unspecified trimester: Secondary | ICD-10-CM | POA: Insufficient documentation

## 2024-05-12 DIAGNOSIS — O09299 Supervision of pregnancy with other poor reproductive or obstetric history, unspecified trimester: Secondary | ICD-10-CM | POA: Insufficient documentation

## 2024-05-12 NOTE — Progress Notes (Signed)
 New OB Intake  I explained I am completing New OB Intake today. We discussed EDD of 12/26/2024, by Last Menstrual Period. Pt is G3P2002. I reviewed her allergies, medications and Medical/Surgical/OB history.    Patient Active Problem List   Diagnosis Date Noted   Supervision of high risk pregnancy, antepartum 05/12/2024   Chronic hypertension affecting pregnancy 05/12/2024   History of gestational diabetes in prior pregnancy, currently pregnant 05/12/2024   BMI 50.0-59.9, adult (HCC) 01/27/2024   Iron  deficiency anemia due to chronic blood loss 10/31/2023   Cauda equina compression (HCC) 04/18/2023   Anemia 11/09/2021   Sleep disturbance 11/09/2021   PCOS (polycystic ovarian syndrome) 10/03/2017   Morbid obesity (HCC) 04/03/2017   Anxiety and depression 09/18/2016    Concerns addressed today  Patient informed that the ultrasound is considered a limited obstetric ultrasound and is not intended to be a complete ultrasound exam.  Patient also informed that the ultrasound is not being completed with the intent of assessing for fetal or placental anomalies or any pelvic abnormalities. Explained that the purpose of today's ultrasound is to assess for viability.  Patient acknowledges the purpose of the exam and the limitations of the study.     Delivery Plans Plans to deliver at [facility]. Discussed the nature of our practice with multiple providers including residents and students. Due to the size of the practice, the delivering provider may not be the same as those providing prenatal care.   MyChart/Babyscripts MyChart access verified. I explained pt will have some visits in office and some virtually. Babyscripts app discussed and ordered.   Blood Pressure Cuff Blood pressure cuff discussed and will get one.Discussed to be used for virtual visits and or if needed BP checks weekly.  Anatomy US  Explained first scheduled US  will be around 19 weeks.   Last Pap Diagnosis  Date Value Ref Range  Status  10/29/2023   Final   - Negative for intraepithelial lesion or malignancy (NILM)    First visit review I reviewed new OB appt with patient. Explained pt will be seen by Dr Herchel at first visit. Discussed [place] genetic screening with patient will get panorama at [facility]. Routine prenatal labs to be collected at [facility].    Wanda Buckles, RN 05/12/2024  11:44 AM

## 2024-06-05 ENCOUNTER — Other Ambulatory Visit: Payer: Self-pay

## 2024-06-05 ENCOUNTER — Ambulatory Visit: Admitting: Obstetrics & Gynecology

## 2024-06-05 ENCOUNTER — Encounter: Payer: Self-pay | Admitting: Obstetrics & Gynecology

## 2024-06-05 VITALS — BP 110/72 | HR 92 | Wt 343.0 lb

## 2024-06-05 DIAGNOSIS — O09291 Supervision of pregnancy with other poor reproductive or obstetric history, first trimester: Secondary | ICD-10-CM | POA: Diagnosis not present

## 2024-06-05 DIAGNOSIS — Z3A10 10 weeks gestation of pregnancy: Secondary | ICD-10-CM

## 2024-06-05 DIAGNOSIS — O10919 Unspecified pre-existing hypertension complicating pregnancy, unspecified trimester: Secondary | ICD-10-CM

## 2024-06-05 DIAGNOSIS — Z1329 Encounter for screening for other suspected endocrine disorder: Secondary | ICD-10-CM

## 2024-06-05 DIAGNOSIS — Z8632 Personal history of gestational diabetes: Secondary | ICD-10-CM | POA: Diagnosis not present

## 2024-06-05 DIAGNOSIS — O10911 Unspecified pre-existing hypertension complicating pregnancy, first trimester: Secondary | ICD-10-CM

## 2024-06-05 DIAGNOSIS — O0990 Supervision of high risk pregnancy, unspecified, unspecified trimester: Secondary | ICD-10-CM

## 2024-06-05 DIAGNOSIS — G834 Cauda equina syndrome: Secondary | ICD-10-CM | POA: Diagnosis not present

## 2024-06-05 DIAGNOSIS — Z23 Encounter for immunization: Secondary | ICD-10-CM

## 2024-06-05 DIAGNOSIS — F32A Depression, unspecified: Secondary | ICD-10-CM | POA: Diagnosis not present

## 2024-06-05 DIAGNOSIS — O99211 Obesity complicating pregnancy, first trimester: Secondary | ICD-10-CM | POA: Diagnosis not present

## 2024-06-05 DIAGNOSIS — F419 Anxiety disorder, unspecified: Secondary | ICD-10-CM | POA: Diagnosis not present

## 2024-06-05 DIAGNOSIS — O0991 Supervision of high risk pregnancy, unspecified, first trimester: Secondary | ICD-10-CM

## 2024-06-05 DIAGNOSIS — O09299 Supervision of pregnancy with other poor reproductive or obstetric history, unspecified trimester: Secondary | ICD-10-CM

## 2024-06-05 MED ORDER — ASPIRIN 81 MG PO TBEC: 162.0000 mg | DELAYED_RELEASE_TABLET | Freq: Every day | ORAL | 2 refills | Status: AC

## 2024-06-05 NOTE — Progress Notes (Signed)
 INITIAL PRENATAL VISIT  History:  Latoya Mora is a 28 y.o. G3P2002 at [redacted]w[redacted]d by LMP, early ultrasound being seen today for her first obstetrical visit.  Her obstetrical history is significant for [facility], history of GDM in previous pregnancy, morbid obesity.  She had two term vaginal deliveries.  Last year, patient was diagnosed with a large L4-5 disc herniation with severe central stenosis and underwent L4-5 laminectomy-microdiscectomy on 10/25/24Patient does intend to breast feed. Pregnancy history fully reviewed.  Patient reports no complaints.  HISTORY: OB History  Gravida Para Term Preterm AB Living  3 2 2  0 0 2  SAB IAB Ectopic Multiple Live Births  0 0 0 0 2    # Outcome Date GA Lbr Len/2nd Weight Sex Type Anes PTL Lv  3 Current           2 Term 08/04/19 [redacted]w[redacted]d 03:40 / 00:03 6 lb 13.9 oz (3.115 kg) M Vag-Spont EPI  LIV     Name: Much,BOY Maecie     Apgar1: 4  Apgar5: 8  1 Term 05/28/18 [redacted]w[redacted]d 02:00 / 00:13 7 lb 6.8 oz (3.368 kg) M Vag-Spont EPI  LIV     Name: Lapp,BOY Mckenze     Apgar1: 7  Apgar5: 9    Last pap smear was done 10/29/2023 and was normal  Past Medical History:  Diagnosis Date   Allergy    Anemia    during pregnancies   Anxiety    Anxiety and depression 09/18/2016   effexor  150mg >11/04/17 wants to wean off  Significant anxiety and depression, postpartum--consider meds at pregnancy end or immed. Pp, safe with breast feeding only     Depression    Elevated liver enzymes    with both pregnancies, no problems after pregnancies   Frequent headaches    no longer having frequent headaches   History of pre-eclampsia    Morbid obesity (HCC) 04/03/2017   PCOS (polycystic ovarian syndrome)    Past Surgical History:  Procedure Laterality Date   LAPAROSCOPIC SALPINGO OOPHERECTOMY Left 02/23/2020   Procedure: LAPAROSCOPIC SALPINGO OOPHORECTOMY;  Surgeon: Herchel Gloris LABOR, MD;  Location: [facility];  Service: Gynecology;  Laterality: Left;   LUMBAR  LAMINECTOMY/DECOMPRESSION MICRODISCECTOMY N/A 04/19/2023   Procedure: LUMBAR LAMINECTOMY/DECOMPRESSION MICRODISCECTOMY  Lumbar Four - Five;  Surgeon: Dawley, Lani BROCKS, DO;  Location: [facility] OR;  Service: Neurosurgery;  Laterality: N/A;   TOOTH EXTRACTION     local anesthestic only   Family History  Problem Relation Age of Onset   Depression Mother    Anxiety disorder Mother    Heart disease Mother    Skin cancer Mother    Scoliosis Mother    Alcohol abuse Father    Bipolar disorder Father    Depression Father    Osteochondroma Father    Osteochondroma Sister    Osteochondroma Brother    Anxiety disorder Maternal Grandfather    COPD Maternal Grandfather    Skin cancer Maternal Grandfather    Parkinson's disease Maternal Grandfather    Anxiety disorder Maternal Grandmother    COPD Maternal Grandmother    Scoliosis Maternal Grandmother    Bipolar disorder Paternal Grandfather    Arthritis Paternal Grandmother    Diabetes Paternal Grandmother    Cancer Neg Hx    Breast cancer Neg Hx    Social History[1] Allergies[2] Medications Ordered Prior to Encounter[3]  Review of Systems Pertinent items noted in HPI and remainder of comprehensive ROS otherwise negative.  Indications for ASA therapy (per UpToDate) One  of the following: (Needs 162 mg daily) Chronic hypertension Yes  Physical Exam:   Vitals:   06/05/24 1027  BP: 110/72  Pulse: 92  Weight: (!) 343 lb (155.6 kg)   Fetal Heart Rate (bpm): 168 on ultrasound (see separate report)  General: well-developed, well-nourished female in no acute distress  Breasts:  deferred  Skin: normal coloration and turgor, no rashes  Neurologic: oriented, normal, negative, normal mood  Extremities: normal strength, tone, and muscle mass, ROM of all joints is normal  HEENT PERRLA, extraocular movement intact and sclera clear, anicteric  Neck supple and no masses  Cardiovascular: regular rate and rhythm  Respiratory:  no respiratory  distress, normal breath sounds  Abdomen: soft, non-tender; bowel sounds normal; no masses,  no organomegaly  Pelvic: deferred    Assessment:  Pregnancy: [id number] Patient Active Problem List   Diagnosis Date Noted   Supervision of high risk pregnancy, antepartum 05/12/2024   Chronic hypertension affecting pregnancy 05/12/2024   History of gestational diabetes in prior pregnancy, currently pregnant 05/12/2024   BMI 50.0-59.9, adult (HCC) 01/27/2024   Iron  deficiency anemia due to chronic blood loss 10/31/2023   Cauda equina compression (HCC) 04/18/2023   Anemia 11/09/2021   Sleep disturbance 11/09/2021   PCOS (polycystic ovarian syndrome) 10/03/2017   Maternal morbid obesity, antepartum (HCC) 04/03/2017   Anxiety and depression 09/18/2016    Plan:  1. Chronic hypertension affecting pregnancy (Primary) [ ]  Baseline labs (CBC, CMP, urine Pr:Cr) [ ]  Aspirin  162 mg daily prescribed after 12 weeks [ ]  Referral to Cardio OB [ ]  Serial growth scans 20-24-28-32-35-38 [ ]  Antenatal testing starting at 32 weeks [ ]  Delivery by 39 weeks (with meds, 40 with no meds) or earlier if needed Discussed implications of CHTN in pregnancy, need for antenatal testing and frequent ultrasounds/prenatal visits, need for optimizing BP control to decrease CHTN/preeclampsia associated maternal-fetal morbidity and mortality. Will check baseline labs today. - Comprehensive metabolic panel with GFR - Protein / creatinine ratio, urine - Aspirin  EC 81 MG tablet; Take 2 tablets (162 mg total) by mouth at bedtime. Start taking when you are [redacted] weeks pregnant for rest of pregnancy for prevention of preeclampsia  Dispense: 300 tablet; Refill: 2AMB Referral to Cardio Obstetrics  2. Maternal morbid obesity, antepartum (HCC) Recommended TWG 11-20 lbs. - Hemoglobin A1c - TSH Rfx on Abnormal to Free T4 - aspirin  EC 81 MG tablet; Take 2 tablets (162 mg total) by mouth at bedtime. Start taking when you are [redacted] weeks pregnant  for rest of pregnancy for prevention of preeclampsia  Dispense: 300 tablet; Refill: 2 - AMB Referral to Cardio Obstetrics  3. History of gestational diabetes in prior pregnancy, currently pregnant 4. Screening for endocrine disorder - Hemoglobin A1c - TSH Rfx on Abnormal to Free T4  5. Cauda equina compression [facility]) s/p L4-5 laminectomy-microdiscectomy  Message sent to Mitzie Laster, MD [facility] Anesthesiology Chief) to see if there are any concerns about regional anesthesia during delivery. Will follow up recommendations.  6. Anxiety and depression Continue medications for anxiety and depression, and continue to follow up with mental health provider  7. Flu vaccine need - Flu vaccine trivalent PF, 6mos and older(Flulaval,Afluria,Fluarix,Fluzone) given  8. [redacted] weeks gestation of pregnancy 9. Supervision of high risk pregnancy, antepartum - US  OB Limited; Future - CBC/D/Plt+RPR+Rh+ABO+RubIgG... - Culture, OB Urine - PANORAMA PRENATAL TEST - HORIZON CUSTOM - aspirin  EC 81 MG tablet; Take 2 tablets (162 mg total) by mouth at bedtime. Start taking when you are  [redacted] weeks pregnant for rest of pregnancy for prevention of preeclampsia  Dispense: 300 tablet; Refill: 2  Initial labs drawn. Continue prenatal vitamins. Problem list reviewed and updated. Genetic Screening discussed, Panorama and Horizon: ordered. Ultrasound discussed; fetal anatomic survey: scheduled. Anticipatory guidance about prenatal visits given including labs, ultrasounds, and testing. Weight gain recommendations per IOM guidelines reviewed: underweight/BMI 18.5 or less > 28 - 40 lbs; normal weight/BMI 18.5 - 24.9 > 25 - 35 lbs; overweight/BMI 25 - 29.9 > 15 - 25 lbs; obese/BMI 30 or more > 11 - 20 lbs. Discussed usage of the Babyscripts app for more information about pregnancy, and to track blood pressures. Also discussed usage of virtual visits as additional source of managing and completing prenatal visits.  Patient was  encouraged to use MyChart to review results, send requests, and have questions addressed.   The nature of Center for [facility] Healthcare/Faculty Practice with multiple MDs and Advanced Practice Providers was explained to patient; also emphasized that residents, students are part of our team.  Also emphasized that we do have female providers; female-only providers cannot be guaranteed. Routine obstetric precautions reviewed. Encouraged to seek out care at our office or emergency room [facility] MAU preferred) for urgent and/or emergent concerns. Return in about 4 weeks (around 07/03/2024) for OFFICE OB VISIT (MD only).     GLORIS HUGGER, MD, FACOG Obstetrician & Gynecologist, [facility] for [employer], [facility]     [1]  Social History Tobacco Use   Smoking status: Never   Smokeless tobacco: Never  Vaping Use   Vaping status: Never Used  Substance Use Topics   Alcohol use: Not Currently   Drug use: No  [2]  Allergies Allergen Reactions   Prozac  [Fluoxetine  Hcl] Other (See Comments)    Felt bad/awful   Tizanidine  Palpitations  [3]  Current Outpatient Medications on File Prior to Visit  Medication Sig Dispense Refill   acetaminophen  (TYLENOL ) 500 MG tablet Take 1,000 mg by mouth every 6 (six) hours as needed (general pain).     diphenhydrAMINE -APAP, sleep, (EXCEDRIN PM PO) Take 2 tablets by mouth at bedtime as needed (general pain).     fluvoxaMINE  (LUVOX ) 50 MG tablet Take 1 tablet (50 mg total) by mouth at bedtime. 30 tablet 1   Prenatal Vit-Fe Fumarate-FA (MULTIVITAMIN-PRENATAL) 27-0.8 MG TABS tablet Take 1 tablet by mouth daily at 12 noon.     QUEtiapine  (SEROQUEL  XR) 50 MG TB24 24 hr tablet Take 2 tablets (100 mg total) by mouth at bedtime. 60 tablet 1   QUEtiapine  (SEROQUEL ) 25 MG tablet Take 1 tablet (25 mg total) by mouth 2 (two) times daily as needed (anxiety). 60 tablet 1   venlafaxine  XR (EFFEXOR  XR) 75 MG 24 hr capsule Take 1 capsule (75 mg  total) by mouth daily with breakfast. 30 capsule 1   No current facility-administered medications on file prior to visit.

## 2024-06-05 NOTE — Progress Notes (Signed)
 Patient informed that the ultrasound is considered a limited obstetric ultrasound and is not intended to be a complete ultrasound exam.  Patient also informed that the ultrasound is not being completed with the intent of assessing for fetal or placental anomalies or any pelvic abnormalities. Explained that the purpose of today's ultrasound is to assess for FHR.  Patient acknowledges the purpose of the exam and the limitations of the study.     Wanda Buckles, RN

## 2024-06-06 ENCOUNTER — Ambulatory Visit: Payer: Self-pay | Admitting: Obstetrics & Gynecology

## 2024-06-06 DIAGNOSIS — Z3490 Encounter for supervision of normal pregnancy, unspecified, unspecified trimester: Secondary | ICD-10-CM | POA: Insufficient documentation

## 2024-06-06 DIAGNOSIS — O0990 Supervision of high risk pregnancy, unspecified, unspecified trimester: Secondary | ICD-10-CM

## 2024-06-06 LAB — CBC/D/PLT+RPR+RH+ABO+RUBIGG...
Antibody Screen: NEGATIVE
Basophils Absolute: 0 x10E3/uL (ref 0.0–0.2)
Basos: 0 %
EOS (ABSOLUTE): 0.1 x10E3/uL (ref 0.0–0.4)
Eos: 1 %
HCV Ab: NONREACTIVE
HIV Screen 4th Generation wRfx: NONREACTIVE
Hematocrit: 39.2 % (ref 34.0–46.6)
Hemoglobin: 12.2 g/dL (ref 11.1–15.9)
Hepatitis B Surface Ag: NEGATIVE
Immature Grans (Abs): 0 x10E3/uL (ref 0.0–0.1)
Immature Granulocytes: 0 %
Lymphocytes Absolute: 2.2 x10E3/uL (ref 0.7–3.1)
Lymphs: 29 %
MCH: 24 pg — ABNORMAL LOW (ref 26.6–33.0)
MCHC: 31.1 g/dL — ABNORMAL LOW (ref 31.5–35.7)
MCV: 77 fL — ABNORMAL LOW (ref 79–97)
Monocytes Absolute: 0.3 x10E3/uL (ref 0.1–0.9)
Monocytes: 4 %
Neutrophils Absolute: 5 x10E3/uL (ref 1.4–7.0)
Neutrophils: 66 %
Platelets: 230 x10E3/uL (ref 150–450)
RBC: 5.08 x10E6/uL (ref 3.77–5.28)
RDW: 15.9 % — ABNORMAL HIGH (ref 11.7–15.4)
RPR Ser Ql: NONREACTIVE
Rh Factor: POSITIVE
Rubella Antibodies, IGG: <0.9 {index} — ABNORMAL LOW (ref >0.99)
WBC: 7.6 x10E3/uL (ref 3.4–10.8)

## 2024-06-06 LAB — PROTEIN / CREATININE RATIO, URINE
Creatinine, Urine: 166.3 mg/dL
Protein, Ur: 19.8 mg/dL
Protein/Creat Ratio: 119 mg/g{creat} (ref 0–200)

## 2024-06-06 LAB — COMPREHENSIVE METABOLIC PANEL WITH GFR
ALT: 8 IU/L (ref 0–32)
AST: 13 IU/L (ref 0–40)
Albumin: 3.9 g/dL — ABNORMAL LOW (ref 4.0–5.0)
Alkaline Phosphatase: 98 IU/L (ref 41–116)
BUN/Creatinine Ratio: 11 (ref 9–23)
BUN: 6 mg/dL (ref 6–20)
Bilirubin Total: <0.2 mg/dL (ref 0.0–1.2)
CO2: 21 mmol/L (ref 20–29)
Calcium: 8.9 mg/dL (ref 8.7–10.2)
Chloride: 102 mmol/L (ref 96–106)
Creatinine, Ser: 0.54 mg/dL — ABNORMAL LOW (ref 0.57–1.00)
Globulin, Total: 3.3 g/dL (ref 1.5–4.5)
Glucose: 107 mg/dL — ABNORMAL HIGH (ref 70–99)
Potassium: 4.2 mmol/L (ref 3.5–5.2)
Sodium: 136 mmol/L (ref 134–144)
Total Protein: 7.2 g/dL (ref 6.0–8.5)
eGFR: 127 mL/min/1.73 (ref >59)

## 2024-06-06 LAB — TSH RFX ON ABNORMAL TO FREE T4: TSH: 1.64 u[IU]/mL (ref 0.450–4.500)

## 2024-06-06 LAB — HEMOGLOBIN A1C
Est. average glucose Bld gHb Est-mCnc: 105 mg/dL
Hgb A1c MFr Bld: 5.3 % (ref 4.8–5.6)

## 2024-06-06 LAB — HCV INTERPRETATION

## 2024-06-07 LAB — CULTURE, OB URINE

## 2024-06-07 LAB — URINE CULTURE, OB REFLEX

## 2024-06-11 LAB — PANORAMA PRENATAL TEST FULL PANEL:PANORAMA TEST PLUS 5 ADDITIONAL MICRODELETIONS: FETAL FRACTION: 5.6

## 2024-06-15 LAB — HORIZON CUSTOM: REPORT SUMMARY: NEGATIVE

## 2024-06-16 ENCOUNTER — Encounter: Payer: Self-pay | Admitting: Oncology

## 2024-06-16 ENCOUNTER — Inpatient Hospital Stay (HOSPITAL_COMMUNITY)
Admission: AD | Admit: 2024-06-16 | Discharge: 2024-06-16 | Disposition: A | Discharge status: Home / Self Care | Attending: Obstetrics & Gynecology | Admitting: Obstetrics & Gynecology

## 2024-06-16 ENCOUNTER — Other Ambulatory Visit: Payer: Self-pay

## 2024-06-16 DIAGNOSIS — O209 Hemorrhage in early pregnancy, unspecified: Secondary | ICD-10-CM | POA: Diagnosis present

## 2024-06-16 DIAGNOSIS — O10911 Unspecified pre-existing hypertension complicating pregnancy, first trimester: Secondary | ICD-10-CM | POA: Insufficient documentation

## 2024-06-16 DIAGNOSIS — Z3A12 12 weeks gestation of pregnancy: Secondary | ICD-10-CM | POA: Diagnosis not present

## 2024-06-16 DIAGNOSIS — Z3491 Encounter for supervision of normal pregnancy, unspecified, first trimester: Secondary | ICD-10-CM

## 2024-06-16 LAB — WET PREP, GENITAL
Clue Cells Wet Prep HPF POC: NONE SEEN
Sperm: NONE SEEN
Trich, Wet Prep: NONE SEEN
WBC, Wet Prep HPF POC: <10
Yeast Wet Prep HPF POC: NONE SEEN

## 2024-06-16 NOTE — Discharge Instructions (Addendum)
 You bleeding was not effecting your pregnancy. Your baby had a normal heart rate.  Your swabs were collected and you will be contacted if any are positive

## 2024-06-16 NOTE — MAU Provider Note (Signed)
 " History     CSN: [id number]  Arrival date and time: 06/16/24 1700   Event Date/Time   First Provider Initiated Contact with Patient 06/16/24 1822      Chief Complaint  Patient presents with   Vaginal Bleeding   HPI  Latoya Mora is at G3P2002 at [redacted]w[redacted]d presenting with spotting and passage of a quarter sized clot. Had a stressful day and reports having a HA. Never had spotting before. Has confirmed viable IUP by US  on 12/12.  Denies pain. Denies filling pad with blood. Has known CHTN and is elevated here in MAU but not taking meds for BP.   OB History     Gravida  3   Para  2   Term  2   Preterm  0   AB  0   Living  2      SAB  0   IAB  0   Ectopic  0   Multiple  0   Live Births  2           Past Medical History:  Diagnosis Date   Allergy    Anemia    during pregnancies   Anxiety    Anxiety and depression 09/18/2016   effexor  150mg >11/04/17 wants to wean off  Significant anxiety and depression, postpartum--consider meds at pregnancy end or immed. Pp, safe with breast feeding only     Depression    Elevated liver enzymes    with both pregnancies, no problems after pregnancies   Frequent headaches    no longer having frequent headaches   History of pre-eclampsia    Morbid obesity (HCC) 04/03/2017   PCOS (polycystic ovarian syndrome)     Past Surgical History:  Procedure Laterality Date   LAPAROSCOPIC SALPINGO OOPHERECTOMY Left 02/23/2020   Procedure: LAPAROSCOPIC SALPINGO OOPHORECTOMY;  Surgeon: Herchel Gloris LABOR, MD;  Location: [facility] OR;  Service: Gynecology;  Laterality: Left;   LUMBAR LAMINECTOMY/DECOMPRESSION MICRODISCECTOMY N/A 04/19/2023   Procedure: LUMBAR LAMINECTOMY/DECOMPRESSION MICRODISCECTOMY  Lumbar Four - Five;  Surgeon: Dawley, Lani BROCKS, DO;  Location: [facility] OR;  Service: Neurosurgery;  Laterality: N/A;   TOOTH EXTRACTION     local anesthestic only    Family History  Problem Relation Age of Onset   Depression Mother    Anxiety disorder  Mother    Heart disease Mother    Skin cancer Mother    Scoliosis Mother    Alcohol abuse Father    Bipolar disorder Father    Depression Father    Osteochondroma Father    Osteochondroma Sister    Osteochondroma Brother    Anxiety disorder Maternal Grandfather    COPD Maternal Grandfather    Skin cancer Maternal Grandfather    Parkinson's disease Maternal Grandfather    Anxiety disorder Maternal Grandmother    COPD Maternal Grandmother    Scoliosis Maternal Grandmother    Bipolar disorder Paternal Grandfather    Arthritis Paternal Grandmother    Diabetes Paternal Grandmother    Cancer Neg Hx    Breast cancer Neg Hx     Social History[1]  Allergies: Allergies[2]  Medications Prior to Admission  Medication Sig Dispense Refill Last Dose/Taking   Prenatal Vit-Fe Fumarate-FA (MULTIVITAMIN-PRENATAL) 27-0.8 MG TABS tablet Take 1 tablet by mouth daily at 12 noon.   06/16/2024   acetaminophen  (TYLENOL ) 500 MG tablet Take 1,000 mg by mouth every 6 (six) hours as needed (general pain).      aspirin  EC 81 MG tablet Take 2 tablets (162 mg  total) by mouth at bedtime. Start taking when you are [redacted] weeks pregnant for rest of pregnancy for prevention of preeclampsia 300 tablet 2    diphenhydrAMINE -APAP, sleep, (EXCEDRIN PM PO) Take 2 tablets by mouth at bedtime as needed (general pain).      fluvoxaMINE  (LUVOX ) 50 MG tablet Take 1 tablet (50 mg total) by mouth at bedtime. 30 tablet 1    QUEtiapine  (SEROQUEL  XR) 50 MG TB24 24 hr tablet Take 2 tablets (100 mg total) by mouth at bedtime. 60 tablet 1    QUEtiapine  (SEROQUEL ) 25 MG tablet Take 1 tablet (25 mg total) by mouth 2 (two) times daily as needed (anxiety). 60 tablet 1    venlafaxine  XR (EFFEXOR  XR) 75 MG 24 hr capsule Take 1 capsule (75 mg total) by mouth daily with breakfast. 30 capsule 1     Review of Systems  Constitutional:  Negative for chills and fever.  HENT:  Negative for congestion and sore throat.   Eyes:  Negative for pain  and visual disturbance.  Respiratory:  Negative for cough, chest tightness and shortness of breath.   Cardiovascular:  Negative for chest pain.  Gastrointestinal:  Negative for abdominal pain, diarrhea, nausea and vomiting.  Endocrine: Negative for cold intolerance and heat intolerance.  Genitourinary:  Negative for dysuria and flank pain.  Musculoskeletal:  Negative for back pain.  Skin:  Negative for rash.  Allergic/Immunologic: Negative for food allergies.  Neurological:  Negative for dizziness and light-headedness.  Psychiatric/Behavioral:  Negative for agitation.    Physical Exam   Blood pressure (!) 154/84, pulse (!) 109, temperature 98.8 F (37.1 C), temperature source Oral, resp. rate 18, height 5' 8 (1.727 m), last menstrual period 03/21/2024, SpO2 100%, unknown if currently breastfeeding.  Physical Exam Vitals and nursing note reviewed.  Constitutional:      General: She is not in acute distress.    Appearance: She is well-developed.     Comments: Pregnant female  HENT:     Head: Normocephalic and atraumatic.  Eyes:     General: No scleral icterus.    Conjunctiva/sclera: Conjunctivae normal.  Cardiovascular:     Rate and Rhythm: Normal rate.  Pulmonary:     Effort: Pulmonary effort is normal.  Chest:     Chest wall: No tenderness.  Abdominal:     Palpations: Abdomen is soft.     Tenderness: There is no abdominal tenderness. There is no guarding or rebound.     Comments: Gravid  Genitourinary:    Vagina: Normal.  Musculoskeletal:        General: Normal range of motion.     Cervical back: Normal range of motion and neck supple.  Skin:    General: Skin is warm and dry.     Findings: No rash.  Neurological:     Mental Status: She is alert and oriented to person, place, and time.     MAU Course  Procedures  MDM- Moderate  Concern for miscarriage vs normal variant vs SCH.  Beside US  performed to confirm viability Reviewed limited nature of US  here in MAU  and possible need for transvaginal Confirmed IUP with FHR 164    Orders Placed This Encounter  Procedures   Wet prep, genital   Discharge patient Discharge disposition: 01-Home or Self Care; Discharge patient date: 06/16/2024    Assessment and Plan   1. Vaginal bleeding affecting early pregnancy   2. Viable pregnancy in first trimester   3. [redacted] weeks gestation of pregnancy   -  Discharged stable condition to home - Pending swabs at time of discharge (GC?CT and Wet Prep) - Reviewed bleeding precautions in detail   Suzen Maryan Masters 06/16/2024, 6:47 PM      [1]  Social History Tobacco Use   Smoking status: Never   Smokeless tobacco: Never  Vaping Use   Vaping status: Never Used  Substance Use Topics   Alcohol use: Not Currently   Drug use: No  [2]  Allergies Allergen Reactions   Prozac  [Fluoxetine  Hcl] Other (See Comments)    Felt bad/awful   Tizanidine  Palpitations   "

## 2024-06-16 NOTE — MAU Note (Signed)
 Latoya Mora is a 28 y.o. at [redacted]w[redacted]d here in MAU reporting: had a very high stress day and had some spotting around 1500, noted a quarter size clot when wiped. Also c/o HA.    Pain score: 3/10 headache Vitals:   06/16/24 1824  BP: (!) 154/84  Pulse: (!) 109  Resp: 18  Temp: 98.8 F (37.1 C)  SpO2: 100%     FHT: 164 by provider US   Lab orders placed from triage: swabs

## 2024-06-17 LAB — GC/CHLAMYDIA PROBE AMP (~~LOC~~) NOT AT ARMC
Chlamydia: NEGATIVE
Comment: NEGATIVE
Comment: NORMAL
Neisseria Gonorrhea: NEGATIVE

## 2024-06-20 ENCOUNTER — Inpatient Hospital Stay (HOSPITAL_COMMUNITY)
Admission: AD | Admit: 2024-06-20 | Discharge: 2024-06-20 | Disposition: A | Discharge status: Home / Self Care | Source: Home / Self Care | Attending: Obstetrics and Gynecology | Admitting: Obstetrics and Gynecology

## 2024-06-20 ENCOUNTER — Encounter: Payer: Self-pay | Admitting: Obstetrics and Gynecology

## 2024-06-20 ENCOUNTER — Other Ambulatory Visit: Payer: Self-pay

## 2024-06-20 ENCOUNTER — Encounter (HOSPITAL_COMMUNITY): Payer: Self-pay | Admitting: Obstetrics and Gynecology

## 2024-06-20 DIAGNOSIS — Z6790 Unspecified blood type, Rh positive: Secondary | ICD-10-CM | POA: Diagnosis not present

## 2024-06-20 DIAGNOSIS — O10911 Unspecified pre-existing hypertension complicating pregnancy, first trimester: Secondary | ICD-10-CM | POA: Diagnosis not present

## 2024-06-20 DIAGNOSIS — O209 Hemorrhage in early pregnancy, unspecified: Secondary | ICD-10-CM

## 2024-06-20 DIAGNOSIS — Z3A13 13 weeks gestation of pregnancy: Secondary | ICD-10-CM

## 2024-06-20 DIAGNOSIS — O26851 Spotting complicating pregnancy, first trimester: Secondary | ICD-10-CM | POA: Insufficient documentation

## 2024-06-20 LAB — URINALYSIS, ROUTINE W REFLEX MICROSCOPIC
Bilirubin Urine: NEGATIVE
Glucose, UA: NEGATIVE mg/dL
Ketones, ur: NEGATIVE mg/dL
Leukocytes,Ua: NEGATIVE
Nitrite: NEGATIVE
Protein, ur: NEGATIVE mg/dL
Specific Gravity, Urine: 1.008 (ref 1.005–1.030)
pH: 5 (ref 5.0–8.0)

## 2024-06-20 MED ORDER — NIFEDIPINE ER OSMOTIC RELEASE 30 MG PO TB24: 30.0000 mg | ORAL_TABLET | Freq: Every day | ORAL | 1 refills | Status: AC

## 2024-06-20 NOTE — Discharge Instructions (Signed)
 Return to care  If you have heavier bleeding that soaks through more than 2 pads per hour for an hour or more If you bleed so much that you feel like you might pass out or you do pass out If you have significant abdominal pain that is not improved with Tylenol 

## 2024-06-20 NOTE — MAU Provider Note (Incomplete)
 " History     CSN: [id number]  Arrival date and time: 06/20/24 2107   Event Date/Time   First Provider Initiated Contact with Patient 06/20/24 2239      Chief Complaint  Patient presents with   Vaginal Bleeding   HPI Latoya Mora is a 28 y.o. G3P2002 at [redacted]w[redacted]d who presents with vaginal bleeding. Symptoms initially started a few days ago as vaginal spotting. Was seen in MAU on Wednesday & reassured by ultrasound. Today bleeding has increased. Filled a pad in 3 hours. Not passing clots. Reports mild cramping.   OB History     Gravida  3   Para  2   Term  2   Preterm  0   AB  0   Living  2      SAB  0   IAB  0   Ectopic  0   Multiple  0   Live Births  2           Past Medical History:  Diagnosis Date   Allergy    Anemia    during pregnancies   Anxiety    Anxiety and depression 09/18/2016   effexor  150mg >11/04/17 wants to wean off  Significant anxiety and depression, postpartum--consider meds at pregnancy end or immed. Pp, safe with breast feeding only     Depression    Elevated liver enzymes    with both pregnancies, no problems after pregnancies   Frequent headaches    no longer having frequent headaches   History of pre-eclampsia    Morbid obesity (HCC) 04/03/2017   PCOS (polycystic ovarian syndrome)     Past Surgical History:  Procedure Laterality Date   LAPAROSCOPIC SALPINGO OOPHERECTOMY Left 02/23/2020   Procedure: LAPAROSCOPIC SALPINGO OOPHORECTOMY;  Surgeon: Herchel Gloris LABOR, MD;  Location: [facility] OR;  Service: Gynecology;  Laterality: Left;   LUMBAR LAMINECTOMY/DECOMPRESSION MICRODISCECTOMY N/A 04/19/2023   Procedure: LUMBAR LAMINECTOMY/DECOMPRESSION MICRODISCECTOMY  Lumbar Four - Five;  Surgeon: Dawley, Lani BROCKS, DO;  Location: [facility] OR;  Service: Neurosurgery;  Laterality: N/A;   TOOTH EXTRACTION     local anesthestic only    Family History  Problem Relation Age of Onset   Depression Mother    Anxiety disorder Mother    Heart disease Mother     Skin cancer Mother    Scoliosis Mother    Alcohol abuse Father    Bipolar disorder Father    Depression Father    Osteochondroma Father    Osteochondroma Sister    Osteochondroma Brother    Anxiety disorder Maternal Grandfather    COPD Maternal Grandfather    Skin cancer Maternal Grandfather    Parkinson's disease Maternal Grandfather    Anxiety disorder Maternal Grandmother    COPD Maternal Grandmother    Scoliosis Maternal Grandmother    Bipolar disorder Paternal Grandfather    Arthritis Paternal Grandmother    Diabetes Paternal Grandmother    Cancer Neg Hx    Breast cancer Neg Hx     Social History[1]  Allergies: Allergies[2]  No medications prior to admission.    Review of Systems  All other systems reviewed and are negative.  Physical Exam   Blood pressure (!) 147/83, pulse 93, temperature 98.6 F (37 C), temperature source Oral, resp. rate 20, height 5' 8 (1.727 m), weight (!) 154.3 kg, last menstrual period 03/21/2024, SpO2 100%, unknown if currently breastfeeding.  Physical Exam Vitals and nursing note reviewed. Exam conducted with a chaperone present.  Constitutional:  General: She is not in acute distress.    Appearance: She is well-developed. She is not ill-appearing.  HENT:     Head: Normocephalic and atraumatic.  Eyes:     General: No scleral icterus.       Right eye: No discharge.        Left eye: No discharge.     Conjunctiva/sclera: Conjunctivae normal.  Pulmonary:     Effort: Pulmonary effort is normal. No respiratory distress.  Genitourinary:    Comments: Pelvic: Small amount of dark red mucoid blood. Cervix closed.  Neurological:     General: No focal deficit present.     Mental Status: She is alert.  Psychiatric:        Mood and Affect: Mood normal.        Behavior: Behavior normal.    Pt informed that the ultrasound is considered a limited OB ultrasound and is not intended to be a complete ultrasound exam.  Patient also  informed that the ultrasound is not being completed with the intent of assessing for fetal or placental anomalies or any pelvic abnormalities.  Explained that the purpose of todays ultrasound is to assess for  viability.  Patient acknowledges the purpose of the exam and the limitations of the study.      MAU Course  Procedures Results for orders placed or performed during the hospital encounter of 06/20/24 (from the past 24 hours)  Urinalysis, Routine w reflex microscopic -Urine, Clean Catch     Status: Abnormal   Collection Time: 06/20/24 10:08 PM  Result Value Ref Range   Color, Urine YELLOW YELLOW   APPearance CLEAR CLEAR   Specific Gravity, Urine 1.008 1.005 - 1.030   pH 5.0 5.0 - 8.0   Glucose, UA NEGATIVE NEGATIVE mg/dL   Hgb urine dipstick MODERATE (A) NEGATIVE   Bilirubin Urine NEGATIVE NEGATIVE   Ketones, ur NEGATIVE NEGATIVE mg/dL   Protein, ur NEGATIVE NEGATIVE mg/dL   Nitrite NEGATIVE NEGATIVE   Leukocytes,Ua NEGATIVE NEGATIVE   RBC / HPF 0-5 0 - 5 RBC/hpf   WBC, UA 0-5 0 - 5 WBC/hpf   Bacteria, UA RARE (A) NONE SEEN   Squamous Epithelial / HPF 0-5 0 - 5 /HPF   Mucus PRESENT     MDM   Assessment and Plan   1. Chronic hypertension with exacerbation during pregnancy in first trimester  -MRBP today & during last MAU visit. Currently no meds. Reviewed with Dr. Cleatus, will start on procardia  30  2. Vaginal bleeding in pregnancy, first trimester  -FHT present via BSUS -RH positive -Minimal bleeding on exam & cervix closed -Reviewed pelvic rest & bleeding precautions.   3. [redacted] weeks gestation of pregnancy    Rocky Satterfield NP 06/21/2024, 12:59 AM      [1]  Social History Tobacco Use   Smoking status: Never   Smokeless tobacco: Never  Vaping Use   Vaping status: Never Used  Substance Use Topics   Alcohol use: Not Currently   Drug use: No  [2]  Allergies Allergen Reactions   Prozac  [Fluoxetine  Hcl] Other (See Comments)    Felt bad/awful   Tizanidine   Palpitations   "

## 2024-06-20 NOTE — MAU Note (Signed)
 Latoya Mora is a 28 y.o. at [redacted]w[redacted]d here in MAU reporting: was here on 23rd for blood clot and spotting - BSUS was normal. Today VB is heavier and filled a pad closer to the moderate when referring to bleeding chart - was wearing the pad for 3 hours. Noticed tissue-like clots. Reports some discomfort in lower abdomen.   Pain score: 1 Vitals:   06/20/24 2143  BP: (!) 152/91  Pulse: (!) 103  Resp: 20  Temp: 98.6 F (37 C)  SpO2: 100%     FHT: unable to hear with doppler   Lab orders placed from triage: UA

## 2024-06-22 ENCOUNTER — Encounter: Payer: Self-pay | Admitting: *Deleted

## 2024-06-26 ENCOUNTER — Inpatient Hospital Stay (HOSPITAL_COMMUNITY)
Admission: AD | Admit: 2024-06-26 | Discharge: 2024-06-26 | Disposition: A | Discharge status: Home / Self Care | Attending: Family Medicine | Admitting: Family Medicine

## 2024-06-26 ENCOUNTER — Inpatient Hospital Stay (HOSPITAL_COMMUNITY)

## 2024-06-26 DIAGNOSIS — R109 Unspecified abdominal pain: Secondary | ICD-10-CM | POA: Diagnosis present

## 2024-06-26 DIAGNOSIS — O09892 Supervision of other high risk pregnancies, second trimester: Secondary | ICD-10-CM | POA: Diagnosis not present

## 2024-06-26 DIAGNOSIS — Z8632 Personal history of gestational diabetes: Secondary | ICD-10-CM | POA: Insufficient documentation

## 2024-06-26 DIAGNOSIS — O09299 Supervision of pregnancy with other poor reproductive or obstetric history, unspecified trimester: Secondary | ICD-10-CM

## 2024-06-26 DIAGNOSIS — O10012 Pre-existing essential hypertension complicating pregnancy, second trimester: Secondary | ICD-10-CM | POA: Diagnosis not present

## 2024-06-26 DIAGNOSIS — Z3A14 14 weeks gestation of pregnancy: Secondary | ICD-10-CM | POA: Diagnosis not present

## 2024-06-26 DIAGNOSIS — O468X2 Other antepartum hemorrhage, second trimester: Secondary | ICD-10-CM

## 2024-06-26 DIAGNOSIS — O468X1 Other antepartum hemorrhage, first trimester: Secondary | ICD-10-CM | POA: Diagnosis not present

## 2024-06-26 DIAGNOSIS — O418X10 Other specified disorders of amniotic fluid and membranes, first trimester, not applicable or unspecified: Secondary | ICD-10-CM | POA: Diagnosis not present

## 2024-06-26 DIAGNOSIS — O209 Hemorrhage in early pregnancy, unspecified: Secondary | ICD-10-CM | POA: Diagnosis present

## 2024-06-26 DIAGNOSIS — O0990 Supervision of high risk pregnancy, unspecified, unspecified trimester: Secondary | ICD-10-CM

## 2024-06-26 DIAGNOSIS — Z113 Encounter for screening for infections with a predominantly sexual mode of transmission: Secondary | ICD-10-CM | POA: Diagnosis present

## 2024-06-26 DIAGNOSIS — O09292 Supervision of pregnancy with other poor reproductive or obstetric history, second trimester: Secondary | ICD-10-CM | POA: Diagnosis not present

## 2024-06-26 DIAGNOSIS — N939 Abnormal uterine and vaginal bleeding, unspecified: Secondary | ICD-10-CM | POA: Diagnosis not present

## 2024-06-26 DIAGNOSIS — Z2839 Other underimmunization status: Secondary | ICD-10-CM | POA: Diagnosis not present

## 2024-06-26 DIAGNOSIS — O10919 Unspecified pre-existing hypertension complicating pregnancy, unspecified trimester: Secondary | ICD-10-CM

## 2024-06-26 NOTE — MAU Note (Signed)
 Pt says she came here on 06-16-2024- had brown VB- spotting on pad - we did U/S - all good. No pain   Then came again on 06-20-2024- bc VB was pink and red  and more on pad.  Did U/S - just watch for VB.  No pain  Then today at 430pm- felt  something run out - B-room - had a large amt red blood on pad and running down her legs.  Started feeling mild cramps - 1/10.  Nothing for pain  Now in Triage- light amt and light red.  Cramps are still 1/10.   [facility]

## 2024-06-27 NOTE — MAU Provider Note (Signed)
 History    Chief Complaint  Patient presents with   Vaginal Bleeding   Abdominal Pain   Latoya Mora , a  29 y.o. [id number] at [redacted]w[redacted]d presents to MAU with complaints of vaginal bleeding. Patient states that she stood up this afternoon and had bright red vaginal bleeding that soaked her pad and out onto her panties and leggings. Patient states that she has been having on-going light vaginal bleeding but nothing as heavy as today. She denies passing clots and frequently changing her pad. She also denies pain but reports a feeling of fullness. Patient has been to MAU frequently for similar complaints.          Vaginal Bleeding The patient's pertinent negatives include no pelvic pain or vaginal discharge. Associated symptoms include abdominal pain. Pertinent negatives include no back pain, chills, constipation, diarrhea, dysuria, fever, headaches, nausea or vomiting.  Abdominal Pain Pertinent negatives include no constipation, diarrhea, dysuria, fever, headaches, nausea or vomiting.    OB History     Gravida  3   Para  2   Term  2   Preterm  0   AB  0   Living  2      SAB  0   IAB  0   Ectopic  0   Multiple  0   Live Births  2           Past Medical History:  Diagnosis Date   Allergy    Anemia    during pregnancies   Anxiety    Anxiety and depression 09/18/2016   effexor  150mg >11/04/17 wants to wean off  Significant anxiety and depression, postpartum--consider meds at pregnancy end or immed. Pp, safe with breast feeding only     Depression    Elevated liver enzymes    with both pregnancies, no problems after pregnancies   Frequent headaches    no longer having frequent headaches   History of pre-eclampsia    Morbid obesity (HCC) 04/03/2017   PCOS (polycystic ovarian syndrome)     Past Surgical History:  Procedure Laterality Date   LAPAROSCOPIC SALPINGO OOPHERECTOMY Left 02/23/2020   Procedure: LAPAROSCOPIC SALPINGO OOPHORECTOMY;  Surgeon: Herchel Gloris LABOR, MD;  Location: [facility] OR;  Service: Gynecology;  Laterality: Left;   LUMBAR LAMINECTOMY/DECOMPRESSION MICRODISCECTOMY N/A 04/19/2023   Procedure: LUMBAR LAMINECTOMY/DECOMPRESSION MICRODISCECTOMY  Lumbar Four - Five;  Surgeon: Dawley, Lani BROCKS, DO;  Location: [facility] OR;  Service: Neurosurgery;  Laterality: N/A;   TOOTH EXTRACTION     local anesthestic only    Family History  Problem Relation Age of Onset   Depression Mother    Anxiety disorder Mother    Heart disease Mother    Skin cancer Mother    Scoliosis Mother    Alcohol abuse Father    Bipolar disorder Father    Depression Father    Osteochondroma Father    Osteochondroma Sister    Osteochondroma Brother    Anxiety disorder Maternal Grandfather    COPD Maternal Grandfather    Skin cancer Maternal Grandfather    Parkinson's disease Maternal Grandfather    Anxiety disorder Maternal Grandmother    COPD Maternal Grandmother    Scoliosis Maternal Grandmother    Bipolar disorder Paternal Grandfather    Arthritis Paternal Grandmother    Diabetes Paternal Grandmother    Cancer Neg Hx    Breast cancer Neg Hx     Social History[1]  Allergies: Allergies[2]  No medications prior to admission.    Review of  Systems  Constitutional:  Negative for chills, fatigue and fever.  Eyes:  Negative for pain and visual disturbance.  Respiratory:  Negative for apnea, shortness of breath and wheezing.   Cardiovascular:  Negative for chest pain and palpitations.  Gastrointestinal:  Positive for abdominal pain. Negative for constipation, diarrhea, nausea and vomiting.  Genitourinary:  Positive for vaginal bleeding. Negative for difficulty urinating, dysuria, pelvic pain, vaginal discharge and vaginal pain.  Musculoskeletal:  Negative for back pain.  Neurological:  Negative for seizures, weakness and headaches.  Psychiatric/Behavioral:  Negative for suicidal ideas.    Physical Exam Blood pressure (!) 143/91, pulse 92, temperature 98.1 F  (36.7 C), temperature source Oral, resp. rate 12, height 5' 8 (1.727 m), weight (!) 151.9 kg, last menstrual period 03/21/2024, unknown if currently breastfeeding. Physical Exam Vitals and nursing note reviewed.  Constitutional:      General: She is not in acute distress.    Appearance: Normal appearance.  HENT:     Head: Normocephalic.  Pulmonary:     Effort: Pulmonary effort is normal.  Musculoskeletal:     Cervical back: Normal range of motion.  Skin:    General: Skin is warm and dry.  Neurological:     Mental Status: She is alert and oriented to person, place, and time.  Psychiatric:        Mood and Affect: Mood normal.    Patient Vitals for the past 24 hrs:  BP Temp Temp src Pulse Resp Height Weight  06/26/24 2310 (!) 143/91 -- -- 92 -- -- --  06/26/24 1926 (!) 150/84 98.1 F (36.7 C) Oral (!) 119 12 5' 8 (1.727 m) (!) 151.9 kg    MAU Course Procedures US  OB Comp Less 14 Wks Result Date: 06/26/2024 CLINICAL DATA:  Vaginal bleeding EXAM: OBSTETRIC <14 WK ULTRASOUND TECHNIQUE: Transabdominal ultrasound was performed for evaluation of the gestation as well as the maternal uterus and adnexal regions. COMPARISON:  06/05/2024, 05/12/2024 FINDINGS: Intrauterine gestational sac: Single Yolk sac:  Not visualized Embryo:  Visualized Cardiac Activity: Visualized Heart Rate: 150 bpm CRL:   80.6 mm   14 w 1 d                  US  EDC: 12/24/2024 Subchorionic hemorrhage: Heterogenous hypo and hyperechoic area at the inferior aspect of the sac measuring 8.2 x 2.9 cm, poorly visible but possibly representing moderate to large subchorionic hematoma. Maternal uterus/adnexae: Left ovary surgically absent. Right ovary not well seen secondary to bowel gas and gravid uterus. No free fluid IMPRESSION: 1. Single live intrauterine pregnancy as above. 2. Difficult to visualize heterogenous collection at the inferior aspect of the gestational sac, potentially representing moderate to large subchorionic  hematoma. Suggest short-term sonographic follow-up. Electronically Signed   By: Luke Bun M.D.   On: 06/26/2024 22:28    -CNM reviewed 2 previous visits on 12/23 and 12/27 and while patient had 2 bedside US  a formal scan had not been performed since November.  - US  ordered   IMPRESSION: 1. Single live intrauterine pregnancy as above. 2. Difficult to visualize heterogenous collection at the inferior aspect of the gestational sac, potentially representing moderate to large subchorionic hematoma. Suggest short-term sonographic follow-up.  MDM - Fetal status remains unchanged.  - Report shows a large [facility] thus cause for bleeding.  - plan for discharge.   1. Supervision of high risk pregnancy, antepartum   2. Chronic hypertension affecting pregnancy   3. History of gestational diabetes in prior pregnancy,  currently pregnant   4. Rubella non-immune status, antepartum   5. Vaginal bleeding   6. Subchorionic hemorrhage of placenta in second trimester    - Reviewed bleeding expectations with a [facility].  - Recommended pelvic rest until evaluated by OBGYN.  - Patient has apt for 1/14. Encouraged to keep appt.  - Message sent to office for f/u US  of [facility].  - Patient discharged home in stable condition and may return to MAU as needed.   Lundyn Coste Erven) Emilio, MSN, CNM  [facility]  06/27/2024 4:21 AM      [1]  Social History Tobacco Use   Smoking status: Never   Smokeless tobacco: Never  Vaping Use   Vaping status: Never Used  Substance Use Topics   Alcohol use: Not Currently   Drug use: No  [2]  Allergies Allergen Reactions   Prozac  [Fluoxetine  Hcl] Other (See Comments)    Felt bad/awful   Tizanidine  Palpitations

## 2024-07-08 ENCOUNTER — Other Ambulatory Visit (INDEPENDENT_AMBULATORY_CARE_PROVIDER_SITE_OTHER): Payer: Self-pay

## 2024-07-08 ENCOUNTER — Ambulatory Visit (INDEPENDENT_AMBULATORY_CARE_PROVIDER_SITE_OTHER): Admitting: Obstetrics and Gynecology

## 2024-07-08 VITALS — BP 127/80 | HR 103 | Wt 345.0 lb

## 2024-07-08 DIAGNOSIS — O0992 Supervision of high risk pregnancy, unspecified, second trimester: Secondary | ICD-10-CM

## 2024-07-08 DIAGNOSIS — O0990 Supervision of high risk pregnancy, unspecified, unspecified trimester: Secondary | ICD-10-CM

## 2024-07-08 DIAGNOSIS — O99212 Obesity complicating pregnancy, second trimester: Secondary | ICD-10-CM | POA: Diagnosis not present

## 2024-07-08 DIAGNOSIS — Z3A15 15 weeks gestation of pregnancy: Secondary | ICD-10-CM

## 2024-07-08 DIAGNOSIS — O468X2 Other antepartum hemorrhage, second trimester: Secondary | ICD-10-CM

## 2024-07-08 DIAGNOSIS — O10919 Unspecified pre-existing hypertension complicating pregnancy, unspecified trimester: Secondary | ICD-10-CM

## 2024-07-08 DIAGNOSIS — O10912 Unspecified pre-existing hypertension complicating pregnancy, second trimester: Secondary | ICD-10-CM

## 2024-07-08 DIAGNOSIS — D649 Anemia, unspecified: Secondary | ICD-10-CM

## 2024-07-08 DIAGNOSIS — Z6841 Body Mass Index (BMI) 40.0 and over, adult: Secondary | ICD-10-CM | POA: Diagnosis not present

## 2024-07-08 DIAGNOSIS — O99210 Obesity complicating pregnancy, unspecified trimester: Secondary | ICD-10-CM

## 2024-07-08 NOTE — Progress Notes (Signed)
 "  PRENATAL VISIT NOTE  Subjective:  Latoya Mora is a 29 y.o. G3P2002 at [redacted]w[redacted]d being seen today for ongoing prenatal care.  She is currently monitored for the following issues for this high-risk pregnancy and has Anxiety and depression; Maternal morbid obesity, antepartum (HCC); Sleep disturbance; Cauda equina compression (HCC); Iron  deficiency anemia due to chronic blood loss; BMI 50.0-59.9, adult (HCC); Supervision of high risk pregnancy, antepartum; Chronic hypertension affecting pregnancy; and Rubella non-immune status, antepartum on their problem list.  Patient reports just brown spotting.  Contractions: Not present. Vag. Bleeding: Scant.   . Denies leaking of fluid.   The following portions of the patient's history were reviewed and updated as appropriate: allergies, current medications, past family history, past medical history, past social history, past surgical history and problem list.   Objective:   Vitals:   07/08/24 1131  BP: 127/80  Pulse: (!) 103  Weight: (!) 345 lb (156.5 kg)    Fetal Status:  Fetal Heart Rate (bpm): 149        General: Alert, oriented and cooperative. Patient is in no acute distress.  Skin: Skin is warm and dry. No rash noted.   Cardiovascular: Normal heart rate noted  Respiratory: Normal respiratory effort, no problems with respiration noted  Abdomen: Soft, gravid, appropriate for gestational age.  Pain/Pressure: Absent     Pelvic: Cervical exam deferred        Extremities: Normal range of motion.     Mental Status: Normal mood and affect. Normal behavior. Normal judgment and thought content.      01/27/2024   10:38 AM 01/07/2024    2:18 PM 10/31/2023    8:42 AM  Depression screen PHQ 2/9  Decreased Interest 2  1  Down, Depressed, Hopeless 2  3  PHQ - 2 Score 4  4  Altered sleeping 1  1  Tired, decreased energy 2  3  Change in appetite 1  3  Feeling bad or failure about yourself  1  1  Trouble concentrating 2  1  Moving slowly or  fidgety/restless 0  0  Suicidal thoughts 0  0  PHQ-9 Score 11   13   Difficult doing work/chores   Not difficult at all     Information is confidential and restricted. Go to Review Flowsheets to unlock data.   Data saved with a previous flowsheet row definition        01/27/2024   10:39 AM 01/07/2024    2:20 PM 10/29/2023    1:13 PM 10/14/2023   10:16 AM  GAD 7 : Generalized Anxiety Score  Nervous, Anxious, on Edge 2  3   Control/stop worrying 2  2   Worry too much - different things 2  3   Trouble relaxing 2  2   Restless 1  1   Easily annoyed or irritable 2  3   Afraid - awful might happen 3  2   Total GAD 7 Score 14  16   Anxiety Difficulty Very difficult  Not difficult at all      Information is confidential and restricted. Go to Review Flowsheets to unlock data.    Assessment and Plan:  Pregnancy: G3P2002 at [redacted]w[redacted]d 1. Supervision of high risk pregnancy, antepartum (Primary) Follow up mfm scan - US  OB Limited; Future  2. Subchorionic hemorrhage of placenta in second trimester Not obvious on u/s for FHTs today - US  OB Limited; Future  3. BMI 50.0-59.9, adult (HCC) Weight stable  4. Obesity  in pregnancy  5. Chronic hypertension affecting pregnancy Doing well on procardia  30 qday  Preterm labor symptoms and general obstetric precautions including but not limited to vaginal bleeding, contractions, leaking of fluid and fetal movement were reviewed in detail with the patient. Please refer to After Visit Summary for other counseling recommendations.   Return in about 3 weeks (around 07/29/2024) for in person, md or app, high risk ob.  Future Appointments  Date Time Provider Department Center  07/29/2024  4:10 PM Emilio Delilah CHRISTELLA EDDY [facility]  08/03/2024  9:00 AM [facility] PROVIDER 1 [facility]  08/03/2024  9:30 AM [facility] US2 [facility]    Bebe Furry, MD  "

## 2024-07-08 NOTE — Progress Notes (Signed)
 Patient informed that the ultrasound is considered a limited obstetric ultrasound and is not intended to be a complete ultrasound exam.  Patient also informed that the ultrasound is not being completed with the intent of assessing for fetal or placental anomalies or any pelvic abnormalities. Explained that the purpose of today's ultrasound is to assess for fetal heart rate.  Patient acknowledges the purpose of the exam and the limitations of the study.     Scheryl Marten, RN

## 2024-07-24 ENCOUNTER — Ambulatory Visit
Admission: EM | Admit: 2024-07-24 | Discharge: 2024-07-24 | Disposition: A | Discharge status: Home / Self Care | Attending: Physician Assistant | Admitting: Physician Assistant

## 2024-07-24 ENCOUNTER — Telehealth: Admitting: Nurse Practitioner

## 2024-07-24 DIAGNOSIS — O2342 Unspecified infection of urinary tract in pregnancy, second trimester: Secondary | ICD-10-CM

## 2024-07-24 DIAGNOSIS — N3001 Acute cystitis with hematuria: Secondary | ICD-10-CM | POA: Diagnosis present

## 2024-07-24 DIAGNOSIS — Z3A17 17 weeks gestation of pregnancy: Secondary | ICD-10-CM | POA: Insufficient documentation

## 2024-07-24 DIAGNOSIS — R030 Elevated blood-pressure reading, without diagnosis of hypertension: Secondary | ICD-10-CM | POA: Diagnosis present

## 2024-07-24 LAB — POCT URINE DIPSTICK
Bilirubin, UA: NEGATIVE
Glucose, UA: NEGATIVE mg/dL
Ketones, POC UA: NEGATIVE mg/dL
Nitrite, UA: NEGATIVE
POC PROTEIN,UA: 30 — AB
Spec Grav, UA: 1.025
Urobilinogen, UA: 1 U/dL
pH, UA: 6

## 2024-07-24 MED ORDER — CEPHALEXIN 500 MG PO CAPS: 500.0000 mg | ORAL_CAPSULE | Freq: Four times a day (QID) | ORAL | 0 refills | Status: AC

## 2024-07-24 NOTE — ED Triage Notes (Signed)
 Pt reports she has burning with urination, urgency,  and frequent urination  since this morning.

## 2024-07-24 NOTE — Progress Notes (Signed)
 Pt [redacted] weeks pregnant. Advised she will need in person management for UTI symptoms given higher risk in pregnancy. MyChart message sent.

## 2024-07-24 NOTE — Discharge Instructions (Addendum)
 It appears you have a urinary tract infection.  I am sending your urine for culture and we will contact you if we need to change your antibiotic based on your culture results.  In the meantime, start cephalexin  4 times daily for 5 days.  Based on the database that I have access to it does appear that Azo was safe during pregnancy there there is not a lot of evidence so it may be best to ask your OB/GYN.  If you have any worsening symptoms including abdominal pain, pelvic pain, fever, nausea, vomiting you need to be seen immediately.  Please follow-up with your OB/GYN as soon as possible as your blood pressure is elevated and you may need to change you medicine; I recommend calling them to let them know that you had an elevated blood pressure reading in clinic today and see if they recommend additional evaluation.

## 2024-07-24 NOTE — ED Provider Notes (Signed)
 " [facility]    CSN: [id number] Arrival date & time: 07/24/24  1338      History   Chief Complaint No chief complaint on file.   HPI Latoya Mora is a 29 y.o. female.   Patient presents today with a several hour history of UTI symptoms.  Reports that she woke up this morning with UTI symptoms including frequency, urgency, dysuria.  She does have a history of recurrent UTI particular when she is pregnant.  She is currently [redacted] weeks pregnant.  She has not tried any over-the-counter analgesics for pain relief.  She does not take SGLT2 inhibitor.  She denies any recent catheterization.  She has not seen a urologist in the past.  She was last treated with antibiotics in September 2025 (Bactrim  DS).    Past Medical History:  Diagnosis Date   Allergy    Anemia    during pregnancies   Anxiety    Anxiety and depression 09/18/2016   effexor  150mg >11/04/17 wants to wean off  Significant anxiety and depression, postpartum--consider meds at pregnancy end or immed. Pp, safe with breast feeding only     Depression    Elevated liver enzymes    with both pregnancies, no problems after pregnancies   Frequent headaches    no longer having frequent headaches   History of pre-eclampsia    Morbid obesity (HCC) 04/03/2017   PCOS (polycystic ovarian syndrome)     Patient Active Problem List   Diagnosis Date Noted   Rubella non-immune status, antepartum 06/06/2024   Supervision of high risk pregnancy, antepartum 05/12/2024   Chronic hypertension affecting pregnancy 05/12/2024   BMI 50.0-59.9, adult (HCC) 01/27/2024   Iron  deficiency anemia due to chronic blood loss 10/31/2023   Cauda equina compression (HCC) 04/18/2023   Sleep disturbance 11/09/2021   Maternal morbid obesity, antepartum (HCC) 04/03/2017   Anxiety and depression 09/18/2016    Past Surgical History:  Procedure Laterality Date   LAPAROSCOPIC SALPINGO OOPHERECTOMY Left 02/23/2020   Procedure: LAPAROSCOPIC SALPINGO  OOPHORECTOMY;  Surgeon: Herchel Gloris LABOR, MD;  Location: [facility] OR;  Service: Gynecology;  Laterality: Left;   LUMBAR LAMINECTOMY/DECOMPRESSION MICRODISCECTOMY N/A 04/19/2023   Procedure: LUMBAR LAMINECTOMY/DECOMPRESSION MICRODISCECTOMY  Lumbar Four - Five;  Surgeon: Dawley, Lani BROCKS, DO;  Location: [facility] OR;  Service: Neurosurgery;  Laterality: N/A;   TOOTH EXTRACTION     local anesthestic only    OB History     Gravida  3   Para  2   Term  2   Preterm  0   AB  0   Living  2      SAB  0   IAB  0   Ectopic  0   Multiple  0   Live Births  2            Home Medications    Prior to Admission medications  Medication Sig Start Date End Date Taking? Authorizing Provider  cephALEXin  (KEFLEX ) 500 MG capsule Take 1 capsule (500 mg total) by mouth 4 (four) times daily. 07/24/24  Yes Garrus Gauthreaux K, PA-C  acetaminophen  (TYLENOL ) 500 MG tablet Take 1,000 mg by mouth every 6 (six) hours as needed (general pain).    [provider]  aspirin  EC 81 MG tablet Take 2 tablets (162 mg total) by mouth at bedtime. Start taking when you are [redacted] weeks pregnant for rest of pregnancy for prevention of preeclampsia 06/05/24   Anyanwu, Ugonna A, MD  diphenhydrAMINE -APAP, sleep, (EXCEDRIN PM PO)  Take 2 tablets by mouth at bedtime as needed (general pain).    [provider]  doxylamine , Sleep, (UNISOM ) 25 MG tablet Take 25 mg by mouth at bedtime as needed.    [provider]  NIFEdipine  (PROCARDIA -XL/NIFEDICAL-XL) 30 MG 24 hr tablet Take 1 tablet (30 mg total) by mouth daily. 06/20/24   Jerilynn Longs, NP  Prenatal Vit-Fe Fumarate-FA (MULTIVITAMIN-PRENATAL) 27-0.8 MG TABS tablet Take 1 tablet by mouth daily at 12 noon.    [provider]    Family History Family History  Problem Relation Age of Onset   Depression Mother    Anxiety disorder Mother    Heart disease Mother    Skin cancer Mother    Scoliosis Mother    Alcohol abuse Father    Bipolar disorder  Father    Depression Father    Osteochondroma Father    Osteochondroma Sister    Osteochondroma Brother    Anxiety disorder Maternal Grandfather    COPD Maternal Grandfather    Skin cancer Maternal Grandfather    Parkinson's disease Maternal Grandfather    Anxiety disorder Maternal Grandmother    COPD Maternal Grandmother    Scoliosis Maternal Grandmother    Bipolar disorder Paternal Grandfather    Arthritis Paternal Grandmother    Diabetes Paternal Grandmother    Cancer Neg Hx    Breast cancer Neg Hx     Social History Social History[1]   Allergies   Prozac  [fluoxetine  hcl] and Tizanidine    Review of Systems Review of Systems  Constitutional:  Positive for activity change. Negative for appetite change, fatigue and fever.  Gastrointestinal:  Negative for abdominal pain, diarrhea, nausea and vomiting.  Genitourinary:  Positive for dysuria, frequency, urgency and vaginal bleeding (OB/GYN is aware and monitoring this; chronic and improving). Negative for vaginal discharge and vaginal pain.     Physical Exam Triage Vital Signs ED Triage Vitals  Encounter Vitals Group     BP 07/24/24 1348 (!) 158/84     Girls Systolic BP Percentile --      Girls Diastolic BP Percentile --      Boys Systolic BP Percentile --      Boys Diastolic BP Percentile --      Pulse Rate 07/24/24 1348 (!) 111     Resp 07/24/24 1348 18     Temp 07/24/24 1348 (!) 97.4 F (36.3 C)     Temp Source 07/24/24 1348 Oral     SpO2 07/24/24 1348 97 %     Weight --      Height --      Head Circumference --      Peak Flow --      Pain Score 07/24/24 1351 2     Pain Loc --      Pain Education --      Exclude from Growth Chart --    No data found.  Updated Vital Signs BP (!) 147/88   Pulse 97   Temp (!) 97.4 F (36.3 C) (Oral)   Resp 18   LMP 03/21/2024   SpO2 98%   Visual Acuity Right Eye Distance:   Left Eye Distance:   Bilateral Distance:    Right Eye Near:   Left Eye Near:     Bilateral Near:     Physical Exam Vitals reviewed.  Constitutional:      General: She is awake. She is not in acute distress.    Appearance: Normal appearance. She is well-developed. She is not  ill-appearing.     Comments: Very pleasant female appears stated age in no acute distress sitting comfortable in exam room  HENT:     Head: Normocephalic and atraumatic.  Cardiovascular:     Rate and Rhythm: Normal rate and regular rhythm.     Heart sounds: Normal heart sounds, S1 normal and S2 normal. No murmur heard. Pulmonary:     Effort: Pulmonary effort is normal.     Breath sounds: Normal breath sounds. No wheezing, rhonchi or rales.     Comments: Clear to auscultation bilaterally Abdominal:     General: Bowel sounds are normal.     Palpations: Abdomen is soft.     Tenderness: There is no abdominal tenderness. There is no right CVA tenderness, left CVA tenderness, guarding or rebound.     Comments: Benign abdominal exam.  Psychiatric:        Behavior: Behavior is cooperative.      UC Treatments / Results  Labs (all labs ordered are listed, but only abnormal results are displayed) Labs Reviewed  POCT URINE DIPSTICK - Abnormal; Notable for the following components:      Result Value   Clarity, UA cloudy (*)    Blood, UA large (*)    POC PROTEIN,UA =30 (*)    Leukocytes, UA Small (1+) (*)    All other components within normal limits  URINE CULTURE    EKG   Radiology No results found.  Procedures Procedures (including critical care time)  Medications Ordered in UC Medications - No data to display  Initial Impression / Assessment and Plan / UC Course  I have reviewed the triage vital signs and the nursing notes.  Pertinent labs & imaging results that were available during my care of the patient were reviewed by me and considered in my medical decision making (see chart for details).     Patient is well-appearing, afebrile, nontoxic, nontachycardic.  UA consistent  with UTI.  Will start cephalexin  500 mg 4 times daily for 5 days given she is pregnant.  No indication for dose adjustment based on her metabolic panel from 06/05/2024 with creatinine of 0.54.  She did request Azo to help manage her symptoms and I looked this up on both up-to-date and appropriate use and it does appear to be generally considered safe in pregnancy but that there are not great studies.  Recommended she try to limit use is much as possible.  Will send her urine for culture and contact her if need to arrange any additional treatment based on culture results.  We discussed that she is at risk for complications because of her history of high risk pregnancies and recurrent UTI and so if anything worsens or changes she is to go to the ER.  Patient was noted to be hypertensive.  She does have a history of preeclampsia as well as hypertension.  She reports compliance with her antiparkinson medication.  She was noted to have some protein in her urine on dipstick today.  I recommend that she call her OB/GYN when she leaves our clinic and inform them that her blood pressure was elevated to see if they recommend evaluation at the MAU or in clinic.  She is monitoring her blood pressure at home and if this does not normalize or if at any point it worsens she should go to the ER.  Final Clinical Impressions(s) / UC Diagnoses   Final diagnoses:  Acute cystitis with hematuria  [redacted] weeks gestation of pregnancy  Elevated  blood pressure reading     Discharge Instructions      It appears you have a urinary tract infection.  I am sending your urine for culture and we will contact you if we need to change your antibiotic based on your culture results.  In the meantime, start cephalexin  4 times daily for 5 days.  Based on the database that I have access to it does appear that Azo was safe during pregnancy there there is not a lot of evidence so it may be best to ask your OB/GYN.  If you have any worsening  symptoms including abdominal pain, pelvic pain, fever, nausea, vomiting you need to be seen immediately.  Please follow-up with your OB/GYN as soon as possible as your blood pressure is elevated and you may need to change you medicine; I recommend calling them to let them know that you had an elevated blood pressure reading in clinic today and see if they recommend additional evaluation.    ED Prescriptions     Medication Sig Dispense Auth. Provider   cephALEXin  (KEFLEX ) 500 MG capsule Take 1 capsule (500 mg total) by mouth 4 (four) times daily. 20 capsule Danitza Schoenfeldt K, PA-C      PDMP not reviewed this encounter.    [1]  Social History Tobacco Use   Smoking status: Never   Smokeless tobacco: Never  Vaping Use   Vaping status: Never Used  Substance Use Topics   Alcohol use: Not Currently   Drug use: No     Sherrell Rocky POUR, PA-C 07/24/24 1934  "

## 2024-07-27 ENCOUNTER — Ambulatory Visit (HOSPITAL_COMMUNITY): Payer: Self-pay

## 2024-07-27 LAB — URINE CULTURE: Culture: 50000 — AB

## 2024-07-29 ENCOUNTER — Ambulatory Visit: Admitting: Certified Nurse Midwife

## 2024-07-29 VITALS — BP 136/83 | HR 105 | Wt 341.0 lb

## 2024-07-29 DIAGNOSIS — O468X2 Other antepartum hemorrhage, second trimester: Secondary | ICD-10-CM

## 2024-07-29 DIAGNOSIS — O10919 Unspecified pre-existing hypertension complicating pregnancy, unspecified trimester: Secondary | ICD-10-CM

## 2024-07-29 DIAGNOSIS — Z3A18 18 weeks gestation of pregnancy: Secondary | ICD-10-CM

## 2024-07-29 DIAGNOSIS — O10912 Unspecified pre-existing hypertension complicating pregnancy, second trimester: Secondary | ICD-10-CM | POA: Diagnosis not present

## 2024-07-29 DIAGNOSIS — O0992 Supervision of high risk pregnancy, unspecified, second trimester: Secondary | ICD-10-CM | POA: Diagnosis not present

## 2024-07-29 DIAGNOSIS — O2342 Unspecified infection of urinary tract in pregnancy, second trimester: Secondary | ICD-10-CM

## 2024-07-29 NOTE — Progress Notes (Signed)
 "  PRENATAL VISIT NOTE  Subjective:  Latoya Mora is a 29 y.o. G3P2002 at [redacted]w[redacted]d being seen today for ongoing prenatal care.  She is currently monitored for the following issues for this high-risk pregnancy and has Anxiety and depression; Maternal morbid obesity, antepartum (HCC); Sleep disturbance; Cauda equina compression (HCC); Iron  deficiency anemia due to chronic blood loss; BMI 50.0-59.9, adult (HCC); Supervision of high risk pregnancy, antepartum; Chronic hypertension affecting pregnancy; and Rubella non-immune status, antepartum on their problem list.  Patient reports elevated BPs at home and currently being treated for UTI in pregnancy. .  Contractions: Not present. Vag. Bleeding: None.  Movement: Present. Denies leaking of fluid.   The following portions of the patient's history were reviewed and updated as appropriate: allergies, current medications, past family history, past medical history, past social history, past surgical history and problem list.   Objective:   Vitals:   07/29/24 1638  BP: 136/83  Pulse: (!) 105  Weight: (!) 341 lb (154.7 kg)    Fetal Status:  Fetal Heart Rate (bpm): 152   Movement: Present    General: Alert, oriented and cooperative. Patient is in no acute distress.  Skin: Skin is warm and dry. No rash noted.   Cardiovascular: Normal heart rate noted  Respiratory: Normal respiratory effort, no problems with respiration noted  Abdomen: Soft, gravid, appropriate for gestational age.  Pain/Pressure: Absent     Pelvic: Cervical exam deferred        Extremities: Normal range of motion.     Mental Status: Normal mood and affect. Normal behavior. Normal judgment and thought content.      01/27/2024   10:38 AM 01/07/2024    2:18 PM 10/31/2023    8:42 AM  Depression screen PHQ 2/9  Decreased Interest 2  1  Down, Depressed, Hopeless 2  3  PHQ - 2 Score 4  4  Altered sleeping 1  1  Tired, decreased energy 2  3  Change in appetite 1  3  Feeling bad or  failure about yourself  1  1  Trouble concentrating 2  1  Moving slowly or fidgety/restless 0  0  Suicidal thoughts 0  0  PHQ-9 Score 11   13   Difficult doing work/chores   Not difficult at all     Information is confidential and restricted. Go to Review Flowsheets to unlock data.   Data saved with a previous flowsheet row definition        01/27/2024   10:39 AM 01/07/2024    2:20 PM 10/29/2023    1:13 PM 10/14/2023   10:16 AM  GAD 7 : Generalized Anxiety Score  Nervous, Anxious, on Edge 2   3    Control/stop worrying 2   2    Worry too much - different things 2   3    Trouble relaxing 2   2    Restless 1   1    Easily annoyed or irritable 2   3    Afraid - awful might happen 3   2    Total GAD 7 Score 14  16   Anxiety Difficulty Very difficult  Not difficult at all      Information is confidential and restricted. Go to Review Flowsheets to unlock data.   Data saved with a previous flowsheet row definition    Assessment and Plan:  Pregnancy: G3P2002 at [redacted]w[redacted]d 1. Supervision of high risk pregnancy in second trimester (Primary) - Patient doing well.  -  Reports some occasional movement  - AFP, Serum, Open Spina Bifida  2. [redacted] weeks gestation of pregnancy - 2nd trimester expectations reviewed . - AFP, Serum, Open Spina Bifida  3. Subchorionic hemorrhage of placenta in second trimester - previously noted 8.2cm x 2cm SCH on MAU US . Patient reports bleeding stopped 5-6 days ago.  - Recommended continued pelvic rest until anatomy scan next week.    4. Chronic hypertension affecting pregnancy - BPs stable today.  - Recommended to continue to monitor. If BP continue to rise may consider titration Procardia  up to 60mg  per day.   5. Urinary tract infection in mother during second trimester of pregnancy - 3 additional day of abx remaining. Recommended to complete course of medication.   Preterm labor symptoms and general obstetric precautions including but not limited to vaginal  bleeding, contractions, leaking of fluid and fetal movement were reviewed in detail with the patient. Please refer to After Visit Summary for other counseling recommendations.   Return in about 4 weeks (around 08/26/2024) for HROB with CNM or MD.  Future Appointments  Date Time Provider Department Center  08/03/2024  9:00 AM [facility] PROVIDER 1 [facility]  08/03/2024  9:30 AM [facility] US2 [facility]  08/27/2024 10:35 AM Fredirick Glenys RAMAN, MD [facility]    Claris CHRISTELLA Cedar, CNM  "

## 2024-07-29 NOTE — Progress Notes (Signed)
 Wants to discuss BP's and possible recheck her CMP (liver)

## 2024-07-30 LAB — AFP, SERUM, OPEN SPINA BIFIDA
AFP MoM: 1.06
AFP Value: 30.3 ng/mL
Gest. Age on Collection Date: 18.4 wk
Maternal Age At EDD: 31.4 a
OSBR Risk 1 IN: 10000
Test Results:: NEGATIVE
Weight: 341 [lb_av]

## 2024-08-03 ENCOUNTER — Other Ambulatory Visit

## 2024-08-03 ENCOUNTER — Ambulatory Visit

## 2024-08-03 DIAGNOSIS — Z3490 Encounter for supervision of normal pregnancy, unspecified, unspecified trimester: Secondary | ICD-10-CM

## 2024-08-03 DIAGNOSIS — O10919 Unspecified pre-existing hypertension complicating pregnancy, unspecified trimester: Secondary | ICD-10-CM

## 2024-08-03 DIAGNOSIS — O0990 Supervision of high risk pregnancy, unspecified, unspecified trimester: Secondary | ICD-10-CM

## 2024-08-27 ENCOUNTER — Encounter: Admitting: Family Medicine
